# Patient Record
Sex: Female | Born: 1970
Health system: Southern US, Community
[De-identification: ages and names within clinical notes are randomized; demographics above are authoritative.]

## PROBLEM LIST (undated history)

## (undated) DIAGNOSIS — G9050 Complex regional pain syndrome I, unspecified: Secondary | ICD-10-CM

## (undated) DIAGNOSIS — M5136 Other intervertebral disc degeneration, lumbar region: Secondary | ICD-10-CM

## (undated) DIAGNOSIS — K219 Gastro-esophageal reflux disease without esophagitis: Secondary | ICD-10-CM

## (undated) DIAGNOSIS — M797 Fibromyalgia: Secondary | ICD-10-CM

## (undated) DIAGNOSIS — R4701 Aphasia: Secondary | ICD-10-CM

## (undated) DIAGNOSIS — K589 Irritable bowel syndrome without diarrhea: Secondary | ICD-10-CM

## (undated) DIAGNOSIS — M5137 Other intervertebral disc degeneration, lumbosacral region: Secondary | ICD-10-CM

## (undated) DIAGNOSIS — E785 Hyperlipidemia, unspecified: Secondary | ICD-10-CM

## (undated) DIAGNOSIS — J449 Chronic obstructive pulmonary disease, unspecified: Secondary | ICD-10-CM

## (undated) DIAGNOSIS — R1032 Left lower quadrant pain: Principal | ICD-10-CM

## (undated) DIAGNOSIS — M1990 Unspecified osteoarthritis, unspecified site: Secondary | ICD-10-CM

## (undated) DIAGNOSIS — M51379 Other intervertebral disc degeneration, lumbosacral region without mention of lumbar back pain or lower extremity pain: Secondary | ICD-10-CM

## (undated) DIAGNOSIS — M4300 Spondylolysis, site unspecified: Secondary | ICD-10-CM

## (undated) DIAGNOSIS — F419 Anxiety disorder, unspecified: Secondary | ICD-10-CM

## (undated) DIAGNOSIS — M5030 Other cervical disc degeneration, unspecified cervical region: Secondary | ICD-10-CM

## (undated) DIAGNOSIS — L409 Psoriasis, unspecified: Secondary | ICD-10-CM

## (undated) DIAGNOSIS — M419 Scoliosis, unspecified: Secondary | ICD-10-CM

## (undated) DIAGNOSIS — G43909 Migraine, unspecified, not intractable, without status migrainosus: Secondary | ICD-10-CM

## (undated) DIAGNOSIS — A048 Other specified bacterial intestinal infections: Secondary | ICD-10-CM

## (undated) DIAGNOSIS — R309 Painful micturition, unspecified: Principal | ICD-10-CM

## (undated) DIAGNOSIS — R4589 Other symptoms and signs involving emotional state: Secondary | ICD-10-CM

## (undated) DIAGNOSIS — I10 Essential (primary) hypertension: Secondary | ICD-10-CM

## (undated) DIAGNOSIS — E119 Type 2 diabetes mellitus without complications: Secondary | ICD-10-CM

## (undated) HISTORY — PX: CARPAL TUNNEL RELEASE: SHX101

## (undated) HISTORY — DX: Other cervical disc degeneration, unspecified cervical region: M50.30

## (undated) HISTORY — DX: Left lower quadrant pain: R10.32

## (undated) HISTORY — DX: Other symptoms and signs involving emotional state: R45.89

## (undated) HISTORY — PX: FOOT SURGERY: SHX648

## (undated) HISTORY — DX: Other specified bacterial intestinal infections: A04.8

## (undated) HISTORY — PX: ABDOMINAL HYSTERECTOMY: SHX81

## (undated) HISTORY — PX: DILATION AND CURETTAGE OF UTERUS: SHX78

## (undated) HISTORY — DX: Irritable bowel syndrome, unspecified: K58.9

## (undated) HISTORY — DX: Painful micturition, unspecified: R30.9

## (undated) HISTORY — PX: TONSILLECTOMY: SUR1361

## (undated) HISTORY — DX: Other intervertebral disc degeneration, lumbar region: M51.36

## (undated) HISTORY — DX: Scoliosis, unspecified: M41.9

## (undated) HISTORY — DX: Hyperlipidemia, unspecified: E78.5

## (undated) HISTORY — DX: Gastro-esophageal reflux disease without esophagitis: K21.9

## (undated) HISTORY — PX: OTHER SURGICAL HISTORY: SHX169

## (undated) HISTORY — PX: CHOLECYSTECTOMY: SHX55

---

## 2002-09-03 ENCOUNTER — Encounter: Payer: Self-pay | Admitting: *Deleted

## 2002-09-03 ENCOUNTER — Emergency Department (HOSPITAL_COMMUNITY): Admission: EM | Admit: 2002-09-03 | Discharge: 2002-09-04 | Payer: Self-pay | Admitting: *Deleted

## 2003-05-05 ENCOUNTER — Other Ambulatory Visit: Admission: RE | Admit: 2003-05-05 | Discharge: 2003-05-05 | Payer: Self-pay

## 2009-04-25 ENCOUNTER — Ambulatory Visit: Admission: RE | Admit: 2009-04-25 | Discharge: 2009-04-25 | Payer: Self-pay | Admitting: Neurology

## 2010-12-20 ENCOUNTER — Other Ambulatory Visit (HOSPITAL_COMMUNITY)
Admission: RE | Admit: 2010-12-20 | Discharge: 2010-12-20 | Disposition: A | Discharge status: Home / Self Care | Payer: Self-pay | Source: Ambulatory Visit | Attending: Internal Medicine | Admitting: Internal Medicine

## 2010-12-20 DIAGNOSIS — Z01419 Encounter for gynecological examination (general) (routine) without abnormal findings: Secondary | ICD-10-CM | POA: Insufficient documentation

## 2011-02-10 NOTE — Procedures (Signed)
NAMEADDYSEN, LOUTH            ACCOUNT NO.:  [id number]   MEDICAL RECORD NO.:  [id number]          PATIENT TYPE:  OUT   LOCATION:  SLEE                          FACILITY:  [facility]   PHYSICIAN:  Kofi A. Gerilyn Pilgrim, M.D. DATE OF BIRTH:  [birth year]   DATE OF PROCEDURE:  05/03/2009  DATE OF DISCHARGE:  04/25/2009                             SLEEP DISORDER REPORT   INDICATION:  This is a 40 year old lady who presented with obesity,  snoring, and insomnia.  She is being evaluated for obstructive sleep  apnea syndrome.   MEDICATIONS:  Lisinopril, sumatriptan, metoclopramide, alprazolam,  Imitrex, zolpidem, Topamax, Proventil, hydrocodone, triamcinolone,  Neurontin, trazodone, ranitidine, diclofenac, omeprazole.   Epworth sleepiness score 4.  BMI 48.   ARCHITECTURAL SUMMARY:  Total recording time is 465 minutes.  Sleep  efficiency 60%.  Sleep latency 133 minutes, REM latency 0.  Stage N1 1%,  N2 42%, N3 57%, and REM sleep 0.   RESPIRATORY SUMMARY:  Baseline oxygen saturation 97.  Lowest saturation  88.  AHI 0.6.   LIMB MOVEMENT SUMMARY:  PLM index 17.   ELECTROCARDIOGRAM SUMMARY:  Average heart rate is 81 with no significant  dysrhythmias observed.   IMPRESSION:  1. Moderate periodic limb movement disorder sleep.  2. Poor architecture with poor sleep efficiency and delayed sleep      onset.      Kofi A. Gerilyn Pilgrim, M.D.  Electronically Signed     KAD/MEDQ  D:  05/03/2009  T:  05/04/2009  Job:  [id number]

## 2011-02-10 NOTE — Procedures (Signed)
   NAME:  Judy Evans, Judy Evans                       ACCOUNT NO.:  [id number]   MEDICAL RECORD NO.:  [id number]                   PATIENT TYPE:  EMS   LOCATION:  ED                                   FACILITY:  [facility]   PHYSICIAN:  Edward L. Juanetta Gosling, M.D.             DATE OF BIRTH:  12-19-1970   DATE OF PROCEDURE:  09/03/2002  DATE OF DISCHARGE:                                EKG INTERPRETATION   Rhythm is sinus rhythm with a rate in the 70s with criteria for left  ventricular hypertrophy.   IMPRESSION:  Mildly abnormal electrocardiogram.                                               Edward L. Juanetta Gosling, M.D.    ELH/MEDQ  D:  10/06/2002  T:  10/07/2002  Job:  [id number]

## 2013-02-20 DIAGNOSIS — M2550 Pain in unspecified joint: Secondary | ICD-10-CM | POA: Insufficient documentation

## 2013-02-20 DIAGNOSIS — S0990XA Unspecified injury of head, initial encounter: Secondary | ICD-10-CM | POA: Insufficient documentation

## 2013-04-16 DIAGNOSIS — R109 Unspecified abdominal pain: Secondary | ICD-10-CM | POA: Insufficient documentation

## 2013-09-25 HISTORY — PX: CARDIAC CATHETERIZATION: SHX172

## 2014-01-07 DIAGNOSIS — R9439 Abnormal result of other cardiovascular function study: Secondary | ICD-10-CM | POA: Insufficient documentation

## 2014-01-12 ENCOUNTER — Emergency Department (HOSPITAL_COMMUNITY)
Admission: EM | Admit: 2014-01-12 | Discharge: 2014-01-12 | Discharge status: Against Medical Advice | Payer: Medicaid Other | Attending: Emergency Medicine | Admitting: Emergency Medicine

## 2014-01-12 ENCOUNTER — Encounter (HOSPITAL_COMMUNITY): Payer: Self-pay | Admitting: Emergency Medicine

## 2014-01-12 DIAGNOSIS — M79609 Pain in unspecified limb: Secondary | ICD-10-CM | POA: Insufficient documentation

## 2014-01-12 DIAGNOSIS — E119 Type 2 diabetes mellitus without complications: Secondary | ICD-10-CM | POA: Insufficient documentation

## 2014-01-12 DIAGNOSIS — J449 Chronic obstructive pulmonary disease, unspecified: Secondary | ICD-10-CM | POA: Insufficient documentation

## 2014-01-12 DIAGNOSIS — F17200 Nicotine dependence, unspecified, uncomplicated: Secondary | ICD-10-CM | POA: Insufficient documentation

## 2014-01-12 DIAGNOSIS — Z872 Personal history of diseases of the skin and subcutaneous tissue: Secondary | ICD-10-CM | POA: Insufficient documentation

## 2014-01-12 DIAGNOSIS — Z8739 Personal history of other diseases of the musculoskeletal system and connective tissue: Secondary | ICD-10-CM | POA: Insufficient documentation

## 2014-01-12 DIAGNOSIS — Z8679 Personal history of other diseases of the circulatory system: Secondary | ICD-10-CM | POA: Insufficient documentation

## 2014-01-12 DIAGNOSIS — I10 Essential (primary) hypertension: Secondary | ICD-10-CM | POA: Insufficient documentation

## 2014-01-12 DIAGNOSIS — J4489 Other specified chronic obstructive pulmonary disease: Secondary | ICD-10-CM | POA: Insufficient documentation

## 2014-01-12 HISTORY — DX: Chronic obstructive pulmonary disease, unspecified: J44.9

## 2014-01-12 HISTORY — DX: Aphasia: R47.01

## 2014-01-12 HISTORY — DX: Psoriasis, unspecified: L40.9

## 2014-01-12 HISTORY — DX: Migraine, unspecified, not intractable, without status migrainosus: G43.909

## 2014-01-12 HISTORY — DX: Unspecified osteoarthritis, unspecified site: M19.90

## 2014-01-12 HISTORY — DX: Type 2 diabetes mellitus without complications: E11.9

## 2014-01-12 HISTORY — DX: Fibromyalgia: M79.7

## 2014-01-12 HISTORY — DX: Essential (primary) hypertension: I10

## 2014-01-12 NOTE — ED Notes (Signed)
Pt reports cath 5 days ago, pt was seen in the ER in [place]. Pt states increase in pain & bruising is getting larger.

## 2014-01-12 NOTE — ED Notes (Addendum)
Pt had cardiac cath in [place] at [facility]  4/15.   Rt upper thigh  Swollen , and discolored.   Pt went to ER at [facility] and was rechecked after cath.

## 2014-01-12 NOTE — ED Notes (Signed)
Pt spoke w/ the patient advocate states she was leaving that she was not waiting any longer. Per pt advocate EDP went to the room & pt was getting ready to use restroom & EDP stated she would return. Pt left w/o signing.

## 2014-04-01 DIAGNOSIS — IMO0002 Reserved for concepts with insufficient information to code with codable children: Secondary | ICD-10-CM | POA: Diagnosis not present

## 2014-04-01 DIAGNOSIS — M549 Dorsalgia, unspecified: Secondary | ICD-10-CM | POA: Diagnosis not present

## 2014-04-01 DIAGNOSIS — M6530 Trigger finger, unspecified finger: Secondary | ICD-10-CM | POA: Diagnosis not present

## 2014-04-01 DIAGNOSIS — L4050 Arthropathic psoriasis, unspecified: Secondary | ICD-10-CM | POA: Diagnosis not present

## 2014-04-06 DIAGNOSIS — M47817 Spondylosis without myelopathy or radiculopathy, lumbosacral region: Secondary | ICD-10-CM | POA: Diagnosis not present

## 2014-04-06 DIAGNOSIS — M5380 Other specified dorsopathies, site unspecified: Secondary | ICD-10-CM | POA: Diagnosis not present

## 2014-04-06 DIAGNOSIS — M5126 Other intervertebral disc displacement, lumbar region: Secondary | ICD-10-CM | POA: Diagnosis not present

## 2014-04-08 DIAGNOSIS — M549 Dorsalgia, unspecified: Secondary | ICD-10-CM | POA: Diagnosis not present

## 2014-04-08 DIAGNOSIS — M6530 Trigger finger, unspecified finger: Secondary | ICD-10-CM | POA: Diagnosis not present

## 2014-04-08 DIAGNOSIS — IMO0002 Reserved for concepts with insufficient information to code with codable children: Secondary | ICD-10-CM | POA: Diagnosis not present

## 2014-04-08 DIAGNOSIS — L4050 Arthropathic psoriasis, unspecified: Secondary | ICD-10-CM | POA: Diagnosis not present

## 2014-04-10 DIAGNOSIS — M479 Spondylosis, unspecified: Secondary | ICD-10-CM | POA: Diagnosis not present

## 2014-05-12 DIAGNOSIS — Z79899 Other long term (current) drug therapy: Secondary | ICD-10-CM | POA: Diagnosis not present

## 2014-05-12 DIAGNOSIS — F3111 Bipolar disorder, current episode manic without psychotic features, mild: Secondary | ICD-10-CM | POA: Diagnosis not present

## 2014-05-12 DIAGNOSIS — E669 Obesity, unspecified: Secondary | ICD-10-CM | POA: Diagnosis not present

## 2014-05-12 DIAGNOSIS — G43909 Migraine, unspecified, not intractable, without status migrainosus: Secondary | ICD-10-CM | POA: Diagnosis not present

## 2014-05-12 DIAGNOSIS — IMO0001 Reserved for inherently not codable concepts without codable children: Secondary | ICD-10-CM | POA: Diagnosis not present

## 2014-05-12 DIAGNOSIS — F411 Generalized anxiety disorder: Secondary | ICD-10-CM | POA: Diagnosis not present

## 2014-05-12 DIAGNOSIS — J45909 Unspecified asthma, uncomplicated: Secondary | ICD-10-CM | POA: Diagnosis not present

## 2014-05-14 DIAGNOSIS — M47817 Spondylosis without myelopathy or radiculopathy, lumbosacral region: Secondary | ICD-10-CM | POA: Diagnosis not present

## 2014-05-14 DIAGNOSIS — IMO0001 Reserved for inherently not codable concepts without codable children: Secondary | ICD-10-CM | POA: Diagnosis not present

## 2014-05-14 DIAGNOSIS — M2559 Pain in other specified joint: Secondary | ICD-10-CM | POA: Diagnosis not present

## 2014-05-14 DIAGNOSIS — M5137 Other intervertebral disc degeneration, lumbosacral region: Secondary | ICD-10-CM | POA: Diagnosis not present

## 2014-05-26 DIAGNOSIS — I770 Arteriovenous fistula, acquired: Secondary | ICD-10-CM | POA: Diagnosis not present

## 2014-05-28 DIAGNOSIS — F17200 Nicotine dependence, unspecified, uncomplicated: Secondary | ICD-10-CM | POA: Diagnosis not present

## 2014-05-28 DIAGNOSIS — M5380 Other specified dorsopathies, site unspecified: Secondary | ICD-10-CM | POA: Diagnosis not present

## 2014-05-28 DIAGNOSIS — Z880 Allergy status to penicillin: Secondary | ICD-10-CM | POA: Diagnosis not present

## 2014-05-28 DIAGNOSIS — IMO0001 Reserved for inherently not codable concepts without codable children: Secondary | ICD-10-CM | POA: Diagnosis not present

## 2014-05-28 DIAGNOSIS — Z888 Allergy status to other drugs, medicaments and biological substances status: Secondary | ICD-10-CM | POA: Diagnosis not present

## 2014-05-28 DIAGNOSIS — G8929 Other chronic pain: Secondary | ICD-10-CM | POA: Diagnosis not present

## 2014-05-28 DIAGNOSIS — L4050 Arthropathic psoriasis, unspecified: Secondary | ICD-10-CM | POA: Diagnosis not present

## 2014-05-28 DIAGNOSIS — F411 Generalized anxiety disorder: Secondary | ICD-10-CM | POA: Diagnosis not present

## 2014-05-28 DIAGNOSIS — M2559 Pain in other specified joint: Secondary | ICD-10-CM | POA: Diagnosis not present

## 2014-05-28 DIAGNOSIS — M5137 Other intervertebral disc degeneration, lumbosacral region: Secondary | ICD-10-CM | POA: Diagnosis not present

## 2014-05-28 DIAGNOSIS — F319 Bipolar disorder, unspecified: Secondary | ICD-10-CM | POA: Diagnosis not present

## 2014-05-28 DIAGNOSIS — Z79899 Other long term (current) drug therapy: Secondary | ICD-10-CM | POA: Diagnosis not present

## 2014-05-28 DIAGNOSIS — M47817 Spondylosis without myelopathy or radiculopathy, lumbosacral region: Secondary | ICD-10-CM | POA: Diagnosis not present

## 2014-05-28 DIAGNOSIS — K589 Irritable bowel syndrome without diarrhea: Secondary | ICD-10-CM | POA: Diagnosis not present

## 2014-06-02 DIAGNOSIS — D235 Other benign neoplasm of skin of trunk: Secondary | ICD-10-CM | POA: Diagnosis not present

## 2014-06-02 DIAGNOSIS — L0100 Impetigo, unspecified: Secondary | ICD-10-CM | POA: Diagnosis not present

## 2014-06-02 DIAGNOSIS — D485 Neoplasm of uncertain behavior of skin: Secondary | ICD-10-CM | POA: Diagnosis not present

## 2014-06-02 DIAGNOSIS — L408 Other psoriasis: Secondary | ICD-10-CM | POA: Diagnosis not present

## 2014-06-10 DIAGNOSIS — J069 Acute upper respiratory infection, unspecified: Secondary | ICD-10-CM | POA: Diagnosis not present

## 2014-06-10 DIAGNOSIS — J329 Chronic sinusitis, unspecified: Secondary | ICD-10-CM | POA: Diagnosis not present

## 2014-06-15 DIAGNOSIS — I770 Arteriovenous fistula, acquired: Secondary | ICD-10-CM | POA: Diagnosis not present

## 2014-06-16 DIAGNOSIS — J209 Acute bronchitis, unspecified: Secondary | ICD-10-CM | POA: Diagnosis not present

## 2014-06-16 DIAGNOSIS — L570 Actinic keratosis: Secondary | ICD-10-CM | POA: Diagnosis not present

## 2014-06-16 DIAGNOSIS — Z79899 Other long term (current) drug therapy: Secondary | ICD-10-CM | POA: Diagnosis not present

## 2014-06-16 DIAGNOSIS — L408 Other psoriasis: Secondary | ICD-10-CM | POA: Diagnosis not present

## 2014-06-16 DIAGNOSIS — D485 Neoplasm of uncertain behavior of skin: Secondary | ICD-10-CM | POA: Diagnosis not present

## 2014-06-16 DIAGNOSIS — L98499 Non-pressure chronic ulcer of skin of other sites with unspecified severity: Secondary | ICD-10-CM | POA: Diagnosis not present

## 2014-06-23 DIAGNOSIS — R220 Localized swelling, mass and lump, head: Secondary | ICD-10-CM | POA: Diagnosis not present

## 2014-06-23 DIAGNOSIS — R059 Cough, unspecified: Secondary | ICD-10-CM | POA: Diagnosis not present

## 2014-06-23 DIAGNOSIS — Z79899 Other long term (current) drug therapy: Secondary | ICD-10-CM | POA: Diagnosis not present

## 2014-06-23 DIAGNOSIS — R6889 Other general symptoms and signs: Secondary | ICD-10-CM | POA: Diagnosis not present

## 2014-06-23 DIAGNOSIS — IMO0001 Reserved for inherently not codable concepts without codable children: Secondary | ICD-10-CM | POA: Diagnosis not present

## 2014-06-23 DIAGNOSIS — F411 Generalized anxiety disorder: Secondary | ICD-10-CM | POA: Diagnosis not present

## 2014-06-23 DIAGNOSIS — Z85828 Personal history of other malignant neoplasm of skin: Secondary | ICD-10-CM | POA: Diagnosis not present

## 2014-06-23 DIAGNOSIS — E7800 Pure hypercholesterolemia, unspecified: Secondary | ICD-10-CM | POA: Diagnosis not present

## 2014-06-23 DIAGNOSIS — G4700 Insomnia, unspecified: Secondary | ICD-10-CM | POA: Diagnosis not present

## 2014-06-23 DIAGNOSIS — H9209 Otalgia, unspecified ear: Secondary | ICD-10-CM | POA: Diagnosis not present

## 2014-06-23 DIAGNOSIS — Z888 Allergy status to other drugs, medicaments and biological substances status: Secondary | ICD-10-CM | POA: Diagnosis not present

## 2014-06-23 DIAGNOSIS — F17200 Nicotine dependence, unspecified, uncomplicated: Secondary | ICD-10-CM | POA: Diagnosis not present

## 2014-06-23 DIAGNOSIS — R209 Unspecified disturbances of skin sensation: Secondary | ICD-10-CM | POA: Diagnosis not present

## 2014-06-23 DIAGNOSIS — L408 Other psoriasis: Secondary | ICD-10-CM | POA: Diagnosis not present

## 2014-06-23 DIAGNOSIS — Z9889 Other specified postprocedural states: Secondary | ICD-10-CM | POA: Diagnosis not present

## 2014-06-23 DIAGNOSIS — J449 Chronic obstructive pulmonary disease, unspecified: Secondary | ICD-10-CM | POA: Diagnosis not present

## 2014-06-23 DIAGNOSIS — Z48817 Encounter for surgical aftercare following surgery on the skin and subcutaneous tissue: Secondary | ICD-10-CM | POA: Diagnosis not present

## 2014-06-23 DIAGNOSIS — Z9089 Acquired absence of other organs: Secondary | ICD-10-CM | POA: Diagnosis not present

## 2014-06-23 DIAGNOSIS — Z90710 Acquired absence of both cervix and uterus: Secondary | ICD-10-CM | POA: Diagnosis not present

## 2014-06-23 DIAGNOSIS — S81009A Unspecified open wound, unspecified knee, initial encounter: Secondary | ICD-10-CM | POA: Diagnosis not present

## 2014-06-23 DIAGNOSIS — J329 Chronic sinusitis, unspecified: Secondary | ICD-10-CM | POA: Diagnosis not present

## 2014-06-23 DIAGNOSIS — S81809A Unspecified open wound, unspecified lower leg, initial encounter: Secondary | ICD-10-CM | POA: Diagnosis not present

## 2014-06-23 DIAGNOSIS — I10 Essential (primary) hypertension: Secondary | ICD-10-CM | POA: Diagnosis not present

## 2014-06-23 DIAGNOSIS — F329 Major depressive disorder, single episode, unspecified: Secondary | ICD-10-CM | POA: Diagnosis not present

## 2014-06-23 DIAGNOSIS — Z880 Allergy status to penicillin: Secondary | ICD-10-CM | POA: Diagnosis not present

## 2014-06-23 DIAGNOSIS — F3289 Other specified depressive episodes: Secondary | ICD-10-CM | POA: Diagnosis not present

## 2014-06-25 DIAGNOSIS — R0981 Nasal congestion: Secondary | ICD-10-CM | POA: Diagnosis not present

## 2014-06-25 DIAGNOSIS — J329 Chronic sinusitis, unspecified: Secondary | ICD-10-CM | POA: Diagnosis not present

## 2014-06-25 DIAGNOSIS — J351 Hypertrophy of tonsils: Secondary | ICD-10-CM | POA: Diagnosis not present

## 2014-06-25 DIAGNOSIS — J343 Hypertrophy of nasal turbinates: Secondary | ICD-10-CM | POA: Diagnosis not present

## 2014-06-25 DIAGNOSIS — I891 Lymphangitis: Secondary | ICD-10-CM | POA: Diagnosis not present

## 2014-06-25 HISTORY — PX: COLONOSCOPY: SHX174

## 2014-07-08 DIAGNOSIS — K625 Hemorrhage of anus and rectum: Secondary | ICD-10-CM | POA: Diagnosis not present

## 2014-07-08 DIAGNOSIS — Z86010 Personal history of colonic polyps: Secondary | ICD-10-CM | POA: Diagnosis not present

## 2014-07-14 DIAGNOSIS — Z825 Family history of asthma and other chronic lower respiratory diseases: Secondary | ICD-10-CM | POA: Diagnosis not present

## 2014-07-14 DIAGNOSIS — Z833 Family history of diabetes mellitus: Secondary | ICD-10-CM | POA: Diagnosis not present

## 2014-07-14 DIAGNOSIS — E119 Type 2 diabetes mellitus without complications: Secondary | ICD-10-CM | POA: Diagnosis not present

## 2014-07-14 DIAGNOSIS — Z8249 Family history of ischemic heart disease and other diseases of the circulatory system: Secondary | ICD-10-CM | POA: Diagnosis not present

## 2014-07-14 DIAGNOSIS — Z86010 Personal history of colonic polyps: Secondary | ICD-10-CM | POA: Diagnosis not present

## 2014-07-14 DIAGNOSIS — F17200 Nicotine dependence, unspecified, uncomplicated: Secondary | ICD-10-CM | POA: Diagnosis not present

## 2014-07-14 DIAGNOSIS — Z888 Allergy status to other drugs, medicaments and biological substances status: Secondary | ICD-10-CM | POA: Diagnosis not present

## 2014-07-14 DIAGNOSIS — K5900 Constipation, unspecified: Secondary | ICD-10-CM | POA: Diagnosis not present

## 2014-07-14 DIAGNOSIS — Z79899 Other long term (current) drug therapy: Secondary | ICD-10-CM | POA: Diagnosis not present

## 2014-07-14 DIAGNOSIS — R1030 Lower abdominal pain, unspecified: Secondary | ICD-10-CM | POA: Diagnosis not present

## 2014-07-14 DIAGNOSIS — F319 Bipolar disorder, unspecified: Secondary | ICD-10-CM | POA: Diagnosis not present

## 2014-07-14 DIAGNOSIS — Z8489 Family history of other specified conditions: Secondary | ICD-10-CM | POA: Diagnosis not present

## 2014-07-14 DIAGNOSIS — Z800 Family history of malignant neoplasm of digestive organs: Secondary | ICD-10-CM | POA: Diagnosis not present

## 2014-07-14 DIAGNOSIS — Z880 Allergy status to penicillin: Secondary | ICD-10-CM | POA: Diagnosis not present

## 2014-07-14 DIAGNOSIS — I10 Essential (primary) hypertension: Secondary | ICD-10-CM | POA: Diagnosis not present

## 2014-07-14 DIAGNOSIS — K625 Hemorrhage of anus and rectum: Secondary | ICD-10-CM | POA: Diagnosis not present

## 2014-07-14 DIAGNOSIS — F418 Other specified anxiety disorders: Secondary | ICD-10-CM | POA: Diagnosis not present

## 2014-07-26 HISTORY — PX: ESOPHAGOGASTRODUODENOSCOPY: SHX1529

## 2014-08-03 DIAGNOSIS — K589 Irritable bowel syndrome without diarrhea: Secondary | ICD-10-CM | POA: Diagnosis not present

## 2014-08-03 DIAGNOSIS — L4050 Arthropathic psoriasis, unspecified: Secondary | ICD-10-CM | POA: Diagnosis not present

## 2014-08-03 DIAGNOSIS — M5136 Other intervertebral disc degeneration, lumbar region: Secondary | ICD-10-CM | POA: Diagnosis not present

## 2014-08-03 DIAGNOSIS — M797 Fibromyalgia: Secondary | ICD-10-CM | POA: Diagnosis not present

## 2014-08-03 DIAGNOSIS — F17210 Nicotine dependence, cigarettes, uncomplicated: Secondary | ICD-10-CM | POA: Diagnosis not present

## 2014-08-03 DIAGNOSIS — M545 Low back pain: Secondary | ICD-10-CM | POA: Diagnosis not present

## 2014-08-03 DIAGNOSIS — Z880 Allergy status to penicillin: Secondary | ICD-10-CM | POA: Diagnosis not present

## 2014-08-03 DIAGNOSIS — M47896 Other spondylosis, lumbar region: Secondary | ICD-10-CM | POA: Diagnosis not present

## 2014-08-03 DIAGNOSIS — Z79899 Other long term (current) drug therapy: Secondary | ICD-10-CM | POA: Diagnosis not present

## 2014-08-03 DIAGNOSIS — F319 Bipolar disorder, unspecified: Secondary | ICD-10-CM | POA: Diagnosis not present

## 2014-08-03 DIAGNOSIS — G8929 Other chronic pain: Secondary | ICD-10-CM | POA: Diagnosis not present

## 2014-08-03 DIAGNOSIS — Z888 Allergy status to other drugs, medicaments and biological substances status: Secondary | ICD-10-CM | POA: Diagnosis not present

## 2014-08-03 DIAGNOSIS — F418 Other specified anxiety disorders: Secondary | ICD-10-CM | POA: Diagnosis not present

## 2014-08-03 DIAGNOSIS — M5408 Panniculitis affecting regions of neck and back, sacral and sacrococcygeal region: Secondary | ICD-10-CM | POA: Diagnosis not present

## 2014-08-06 DIAGNOSIS — K219 Gastro-esophageal reflux disease without esophagitis: Secondary | ICD-10-CM | POA: Diagnosis not present

## 2014-08-10 DIAGNOSIS — L400 Psoriasis vulgaris: Secondary | ICD-10-CM | POA: Diagnosis not present

## 2014-08-10 DIAGNOSIS — Z79891 Long term (current) use of opiate analgesic: Secondary | ICD-10-CM | POA: Diagnosis not present

## 2014-08-11 DIAGNOSIS — Z8249 Family history of ischemic heart disease and other diseases of the circulatory system: Secondary | ICD-10-CM | POA: Diagnosis not present

## 2014-08-11 DIAGNOSIS — Z833 Family history of diabetes mellitus: Secondary | ICD-10-CM | POA: Diagnosis not present

## 2014-08-11 DIAGNOSIS — I10 Essential (primary) hypertension: Secondary | ICD-10-CM | POA: Diagnosis not present

## 2014-08-11 DIAGNOSIS — Z880 Allergy status to penicillin: Secondary | ICD-10-CM | POA: Diagnosis not present

## 2014-08-11 DIAGNOSIS — Z8489 Family history of other specified conditions: Secondary | ICD-10-CM | POA: Diagnosis not present

## 2014-08-11 DIAGNOSIS — Z888 Allergy status to other drugs, medicaments and biological substances status: Secondary | ICD-10-CM | POA: Diagnosis not present

## 2014-08-11 DIAGNOSIS — E119 Type 2 diabetes mellitus without complications: Secondary | ICD-10-CM | POA: Diagnosis not present

## 2014-08-11 DIAGNOSIS — Z825 Family history of asthma and other chronic lower respiratory diseases: Secondary | ICD-10-CM | POA: Diagnosis not present

## 2014-08-11 DIAGNOSIS — F17200 Nicotine dependence, unspecified, uncomplicated: Secondary | ICD-10-CM | POA: Diagnosis not present

## 2014-08-11 DIAGNOSIS — Z79899 Other long term (current) drug therapy: Secondary | ICD-10-CM | POA: Diagnosis not present

## 2014-08-11 DIAGNOSIS — F319 Bipolar disorder, unspecified: Secondary | ICD-10-CM | POA: Diagnosis not present

## 2014-08-11 DIAGNOSIS — K219 Gastro-esophageal reflux disease without esophagitis: Secondary | ICD-10-CM | POA: Diagnosis not present

## 2014-08-11 DIAGNOSIS — F418 Other specified anxiety disorders: Secondary | ICD-10-CM | POA: Diagnosis not present

## 2014-08-24 DIAGNOSIS — F339 Major depressive disorder, recurrent, unspecified: Secondary | ICD-10-CM | POA: Diagnosis not present

## 2014-08-31 DIAGNOSIS — F329 Major depressive disorder, single episode, unspecified: Secondary | ICD-10-CM | POA: Diagnosis not present

## 2014-08-31 DIAGNOSIS — R51 Headache: Secondary | ICD-10-CM | POA: Diagnosis not present

## 2014-08-31 DIAGNOSIS — L400 Psoriasis vulgaris: Secondary | ICD-10-CM | POA: Diagnosis not present

## 2014-08-31 DIAGNOSIS — Z79899 Other long term (current) drug therapy: Secondary | ICD-10-CM | POA: Diagnosis not present

## 2014-09-11 DIAGNOSIS — E784 Other hyperlipidemia: Secondary | ICD-10-CM | POA: Diagnosis not present

## 2014-09-11 DIAGNOSIS — G43709 Chronic migraine without aura, not intractable, without status migrainosus: Secondary | ICD-10-CM | POA: Diagnosis not present

## 2014-09-11 DIAGNOSIS — E1165 Type 2 diabetes mellitus with hyperglycemia: Secondary | ICD-10-CM | POA: Diagnosis not present

## 2014-09-21 DIAGNOSIS — L400 Psoriasis vulgaris: Secondary | ICD-10-CM | POA: Diagnosis not present

## 2014-09-21 DIAGNOSIS — Z79899 Other long term (current) drug therapy: Secondary | ICD-10-CM | POA: Diagnosis not present

## 2014-09-24 DIAGNOSIS — Z79899 Other long term (current) drug therapy: Secondary | ICD-10-CM | POA: Diagnosis not present

## 2014-09-29 DIAGNOSIS — N951 Menopausal and female climacteric states: Secondary | ICD-10-CM | POA: Diagnosis not present

## 2014-10-01 DIAGNOSIS — M5431 Sciatica, right side: Secondary | ICD-10-CM | POA: Diagnosis not present

## 2014-10-01 DIAGNOSIS — G8929 Other chronic pain: Secondary | ICD-10-CM | POA: Diagnosis not present

## 2014-10-01 DIAGNOSIS — M65311 Trigger thumb, right thumb: Secondary | ICD-10-CM | POA: Diagnosis not present

## 2014-10-04 DIAGNOSIS — J019 Acute sinusitis, unspecified: Secondary | ICD-10-CM | POA: Diagnosis not present

## 2014-10-28 DIAGNOSIS — J449 Chronic obstructive pulmonary disease, unspecified: Secondary | ICD-10-CM | POA: Diagnosis not present

## 2014-10-28 DIAGNOSIS — F418 Other specified anxiety disorders: Secondary | ICD-10-CM | POA: Diagnosis not present

## 2014-10-28 DIAGNOSIS — I10 Essential (primary) hypertension: Secondary | ICD-10-CM | POA: Diagnosis not present

## 2014-10-28 DIAGNOSIS — M797 Fibromyalgia: Secondary | ICD-10-CM | POA: Diagnosis not present

## 2014-10-28 DIAGNOSIS — Z9049 Acquired absence of other specified parts of digestive tract: Secondary | ICD-10-CM | POA: Diagnosis not present

## 2014-10-28 DIAGNOSIS — M479 Spondylosis, unspecified: Secondary | ICD-10-CM | POA: Diagnosis not present

## 2014-10-28 DIAGNOSIS — G43909 Migraine, unspecified, not intractable, without status migrainosus: Secondary | ICD-10-CM | POA: Diagnosis not present

## 2014-10-28 DIAGNOSIS — F319 Bipolar disorder, unspecified: Secondary | ICD-10-CM | POA: Diagnosis not present

## 2014-10-28 DIAGNOSIS — M5136 Other intervertebral disc degeneration, lumbar region: Secondary | ICD-10-CM | POA: Diagnosis not present

## 2014-10-28 DIAGNOSIS — E780 Pure hypercholesterolemia: Secondary | ICD-10-CM | POA: Diagnosis not present

## 2014-10-28 DIAGNOSIS — M419 Scoliosis, unspecified: Secondary | ICD-10-CM | POA: Diagnosis not present

## 2014-10-28 DIAGNOSIS — Z90710 Acquired absence of both cervix and uterus: Secondary | ICD-10-CM | POA: Diagnosis not present

## 2014-10-28 DIAGNOSIS — Z79899 Other long term (current) drug therapy: Secondary | ICD-10-CM | POA: Diagnosis not present

## 2014-11-02 DIAGNOSIS — M797 Fibromyalgia: Secondary | ICD-10-CM | POA: Diagnosis not present

## 2014-11-02 DIAGNOSIS — M5136 Other intervertebral disc degeneration, lumbar region: Secondary | ICD-10-CM | POA: Diagnosis not present

## 2014-11-02 DIAGNOSIS — Z79899 Other long term (current) drug therapy: Secondary | ICD-10-CM | POA: Diagnosis not present

## 2014-11-02 DIAGNOSIS — F419 Anxiety disorder, unspecified: Secondary | ICD-10-CM | POA: Diagnosis not present

## 2014-11-02 DIAGNOSIS — G8929 Other chronic pain: Secondary | ICD-10-CM | POA: Diagnosis not present

## 2014-11-02 DIAGNOSIS — Z888 Allergy status to other drugs, medicaments and biological substances status: Secondary | ICD-10-CM | POA: Diagnosis not present

## 2014-11-02 DIAGNOSIS — L4050 Arthropathic psoriasis, unspecified: Secondary | ICD-10-CM | POA: Diagnosis not present

## 2014-11-02 DIAGNOSIS — F319 Bipolar disorder, unspecified: Secondary | ICD-10-CM | POA: Diagnosis not present

## 2014-11-02 DIAGNOSIS — K589 Irritable bowel syndrome without diarrhea: Secondary | ICD-10-CM | POA: Diagnosis not present

## 2014-11-02 DIAGNOSIS — F17210 Nicotine dependence, cigarettes, uncomplicated: Secondary | ICD-10-CM | POA: Diagnosis not present

## 2014-11-02 DIAGNOSIS — Z880 Allergy status to penicillin: Secondary | ICD-10-CM | POA: Diagnosis not present

## 2014-11-02 DIAGNOSIS — M47816 Spondylosis without myelopathy or radiculopathy, lumbar region: Secondary | ICD-10-CM | POA: Diagnosis not present

## 2014-11-16 DIAGNOSIS — F339 Major depressive disorder, recurrent, unspecified: Secondary | ICD-10-CM | POA: Diagnosis not present

## 2014-11-25 ENCOUNTER — Encounter: Payer: Self-pay | Admitting: *Deleted

## 2014-12-01 ENCOUNTER — Encounter: Payer: Medicare Other | Admitting: Women's Health

## 2014-12-02 DIAGNOSIS — M545 Low back pain: Secondary | ICD-10-CM | POA: Diagnosis not present

## 2014-12-02 DIAGNOSIS — M5136 Other intervertebral disc degeneration, lumbar region: Secondary | ICD-10-CM | POA: Diagnosis not present

## 2014-12-02 DIAGNOSIS — M797 Fibromyalgia: Secondary | ICD-10-CM | POA: Diagnosis not present

## 2014-12-02 DIAGNOSIS — M47816 Spondylosis without myelopathy or radiculopathy, lumbar region: Secondary | ICD-10-CM | POA: Diagnosis not present

## 2014-12-07 DIAGNOSIS — J40 Bronchitis, not specified as acute or chronic: Secondary | ICD-10-CM | POA: Diagnosis not present

## 2014-12-07 DIAGNOSIS — R300 Dysuria: Secondary | ICD-10-CM | POA: Diagnosis not present

## 2014-12-08 ENCOUNTER — Encounter: Payer: Medicare Other | Admitting: Women's Health

## 2014-12-09 DIAGNOSIS — M79604 Pain in right leg: Secondary | ICD-10-CM | POA: Diagnosis not present

## 2014-12-09 DIAGNOSIS — M5136 Other intervertebral disc degeneration, lumbar region: Secondary | ICD-10-CM | POA: Diagnosis not present

## 2014-12-09 DIAGNOSIS — F17200 Nicotine dependence, unspecified, uncomplicated: Secondary | ICD-10-CM | POA: Diagnosis not present

## 2014-12-09 DIAGNOSIS — I739 Peripheral vascular disease, unspecified: Secondary | ICD-10-CM | POA: Diagnosis not present

## 2014-12-14 DIAGNOSIS — B349 Viral infection, unspecified: Secondary | ICD-10-CM | POA: Diagnosis not present

## 2014-12-23 DIAGNOSIS — F17200 Nicotine dependence, unspecified, uncomplicated: Secondary | ICD-10-CM | POA: Diagnosis not present

## 2014-12-23 DIAGNOSIS — I10 Essential (primary) hypertension: Secondary | ICD-10-CM | POA: Diagnosis not present

## 2014-12-23 DIAGNOSIS — Z79899 Other long term (current) drug therapy: Secondary | ICD-10-CM | POA: Diagnosis not present

## 2014-12-23 DIAGNOSIS — E780 Pure hypercholesterolemia: Secondary | ICD-10-CM | POA: Diagnosis not present

## 2014-12-23 DIAGNOSIS — J45909 Unspecified asthma, uncomplicated: Secondary | ICD-10-CM | POA: Diagnosis not present

## 2014-12-23 DIAGNOSIS — E119 Type 2 diabetes mellitus without complications: Secondary | ICD-10-CM | POA: Diagnosis not present

## 2014-12-23 DIAGNOSIS — F319 Bipolar disorder, unspecified: Secondary | ICD-10-CM | POA: Diagnosis not present

## 2014-12-23 DIAGNOSIS — M797 Fibromyalgia: Secondary | ICD-10-CM | POA: Diagnosis not present

## 2014-12-23 DIAGNOSIS — J302 Other seasonal allergic rhinitis: Secondary | ICD-10-CM | POA: Diagnosis not present

## 2014-12-23 DIAGNOSIS — R51 Headache: Secondary | ICD-10-CM | POA: Diagnosis not present

## 2014-12-23 DIAGNOSIS — J449 Chronic obstructive pulmonary disease, unspecified: Secondary | ICD-10-CM | POA: Diagnosis not present

## 2014-12-24 DIAGNOSIS — Z90710 Acquired absence of both cervix and uterus: Secondary | ICD-10-CM | POA: Diagnosis not present

## 2014-12-24 DIAGNOSIS — R109 Unspecified abdominal pain: Secondary | ICD-10-CM | POA: Diagnosis not present

## 2014-12-24 DIAGNOSIS — R300 Dysuria: Secondary | ICD-10-CM | POA: Diagnosis not present

## 2014-12-24 DIAGNOSIS — K449 Diaphragmatic hernia without obstruction or gangrene: Secondary | ICD-10-CM | POA: Diagnosis not present

## 2014-12-24 DIAGNOSIS — J449 Chronic obstructive pulmonary disease, unspecified: Secondary | ICD-10-CM | POA: Diagnosis not present

## 2014-12-24 DIAGNOSIS — Z9049 Acquired absence of other specified parts of digestive tract: Secondary | ICD-10-CM | POA: Diagnosis not present

## 2014-12-24 DIAGNOSIS — R1032 Left lower quadrant pain: Secondary | ICD-10-CM | POA: Diagnosis not present

## 2014-12-24 DIAGNOSIS — F319 Bipolar disorder, unspecified: Secondary | ICD-10-CM | POA: Diagnosis not present

## 2014-12-24 DIAGNOSIS — M5136 Other intervertebral disc degeneration, lumbar region: Secondary | ICD-10-CM | POA: Diagnosis not present

## 2014-12-24 DIAGNOSIS — M419 Scoliosis, unspecified: Secondary | ICD-10-CM | POA: Diagnosis not present

## 2014-12-24 DIAGNOSIS — I10 Essential (primary) hypertension: Secondary | ICD-10-CM | POA: Diagnosis not present

## 2014-12-24 DIAGNOSIS — E780 Pure hypercholesterolemia: Secondary | ICD-10-CM | POA: Diagnosis not present

## 2014-12-24 DIAGNOSIS — Z79899 Other long term (current) drug therapy: Secondary | ICD-10-CM | POA: Diagnosis not present

## 2014-12-24 DIAGNOSIS — M479 Spondylosis, unspecified: Secondary | ICD-10-CM | POA: Diagnosis not present

## 2014-12-24 DIAGNOSIS — N390 Urinary tract infection, site not specified: Secondary | ICD-10-CM | POA: Diagnosis not present

## 2014-12-24 DIAGNOSIS — Z720 Tobacco use: Secondary | ICD-10-CM | POA: Diagnosis not present

## 2014-12-24 DIAGNOSIS — M1990 Unspecified osteoarthritis, unspecified site: Secondary | ICD-10-CM | POA: Diagnosis not present

## 2014-12-28 ENCOUNTER — Encounter: Payer: Medicare Other | Admitting: Women's Health

## 2014-12-28 DIAGNOSIS — J0110 Acute frontal sinusitis, unspecified: Secondary | ICD-10-CM | POA: Diagnosis not present

## 2014-12-28 DIAGNOSIS — L401 Generalized pustular psoriasis: Secondary | ICD-10-CM | POA: Diagnosis not present

## 2014-12-28 DIAGNOSIS — E1165 Type 2 diabetes mellitus with hyperglycemia: Secondary | ICD-10-CM | POA: Diagnosis not present

## 2014-12-28 DIAGNOSIS — E784 Other hyperlipidemia: Secondary | ICD-10-CM | POA: Diagnosis not present

## 2015-01-06 ENCOUNTER — Encounter: Payer: Medicare Other | Admitting: Women's Health

## 2015-01-12 DIAGNOSIS — Z1231 Encounter for screening mammogram for malignant neoplasm of breast: Secondary | ICD-10-CM | POA: Diagnosis not present

## 2015-01-13 ENCOUNTER — Encounter: Payer: Self-pay | Admitting: Women's Health

## 2015-01-13 ENCOUNTER — Ambulatory Visit (INDEPENDENT_AMBULATORY_CARE_PROVIDER_SITE_OTHER): Payer: Medicare Other | Admitting: Women's Health

## 2015-01-13 VITALS — BP 120/62 | HR 80 | Ht 61.25 in | Wt 249.0 lb

## 2015-01-13 DIAGNOSIS — G43709 Chronic migraine without aura, not intractable, without status migrainosus: Secondary | ICD-10-CM | POA: Insufficient documentation

## 2015-01-13 DIAGNOSIS — I10 Essential (primary) hypertension: Secondary | ICD-10-CM | POA: Insufficient documentation

## 2015-01-13 DIAGNOSIS — E8941 Symptomatic postprocedural ovarian failure: Secondary | ICD-10-CM | POA: Insufficient documentation

## 2015-01-13 DIAGNOSIS — R102 Pelvic and perineal pain: Secondary | ICD-10-CM | POA: Diagnosis not present

## 2015-01-13 DIAGNOSIS — J449 Chronic obstructive pulmonary disease, unspecified: Secondary | ICD-10-CM | POA: Insufficient documentation

## 2015-01-13 DIAGNOSIS — R399 Unspecified symptoms and signs involving the genitourinary system: Secondary | ICD-10-CM | POA: Diagnosis not present

## 2015-01-13 DIAGNOSIS — R300 Dysuria: Secondary | ICD-10-CM | POA: Insufficient documentation

## 2015-01-13 DIAGNOSIS — L409 Psoriasis, unspecified: Secondary | ICD-10-CM | POA: Insufficient documentation

## 2015-01-13 DIAGNOSIS — M797 Fibromyalgia: Secondary | ICD-10-CM | POA: Insufficient documentation

## 2015-01-13 DIAGNOSIS — E785 Hyperlipidemia, unspecified: Secondary | ICD-10-CM | POA: Insufficient documentation

## 2015-01-13 DIAGNOSIS — M1990 Unspecified osteoarthritis, unspecified site: Secondary | ICD-10-CM | POA: Insufficient documentation

## 2015-01-13 DIAGNOSIS — R7303 Prediabetes: Secondary | ICD-10-CM | POA: Insufficient documentation

## 2015-01-13 LAB — POCT URINALYSIS DIPSTICK
Blood, UA: NEGATIVE
Glucose, UA: NEGATIVE
Ketones, UA: NEGATIVE
LEUKOCYTES UA: NEGATIVE
NITRITE UA: NEGATIVE
Protein, UA: NEGATIVE

## 2015-01-13 LAB — POCT WET PREP (WET MOUNT): Clue Cells Wet Prep Whiff POC: NEGATIVE

## 2015-01-13 MED ORDER — NYSTATIN 100000 UNIT/GM EX OINT: 1.0000 "application " | TOPICAL_OINTMENT | Freq: Two times a day (BID) | CUTANEOUS | Status: DC | PRN

## 2015-01-13 MED ORDER — TRIAMCINOLONE ACETONIDE 0.1 % EX OINT: 1.0000 "application " | TOPICAL_OINTMENT | Freq: Two times a day (BID) | CUTANEOUS | Status: DC | PRN

## 2015-01-13 NOTE — Progress Notes (Signed)
Patient ID: Judy Evans, female   DOB: 29-Dec-1970, 44 y.o.   MRN: [id number]   [facility] Visit  Patient name: Judy Evans MRN [id number]  Date of birth: 1971-03-02  CC & HPI:  Judy Evans is a 44 y.o. Caucasian female presenting today with report of constant hot flashes that last 'all day long', pelvic pain/pressure, vulvar irritation, dysuria. She had partial hysterectomy in 2005 by Judy Evans for 'pre-cancerous cells', has had a normal pap since and was told she could stop paps. She had BSO in 2008. She has not been sexually active x 90yrs. Doesn't really notice any vaginal dryness or mood changes. PCP is Dr. Zada Evans, who she sees regularly. Did have a cardiac catheterization through Rt groin and had a large hematoma on Rt thigh x 4 months. States during entire procedure she felt vaginal pain.   Pertinent History Reviewed:  Medical & Surgical Hx:   Past Medical History  Diagnosis Date  . Diabetes mellitus without complication   . Hypertension   . Migraines   . Aphasia   . Psoriasis   . Arthritis   . Fibromyalgia   . COPD (chronic obstructive pulmonary disease)   . Hyperlipidemia    Past Surgical History  Procedure Laterality Date  . Cardiac surgery    . Abdominal hysterectomy    . Cesarean section    . Tonsillectomy    . Cholecystectomy    . Rotator cuff surgery    . Foot surgery Bilateral    Medications: Reviewed & Updated - see associated section Social History: Reviewed -  reports that she has been smoking Cigarettes.  She has a 30 pack-year smoking history. She has never used smokeless tobacco.  Objective Findings:  Vitals: BP 120/62 mmHg  Pulse 80  Ht 5' 1.25" (1.556 m)  Wt 249 lb (112.946 kg)  BMI 46.65 kg/m2  Physical Examination: General appearance - alert, well appearing, and in no distress Chest - CTAB Heart - normal rate, regular rhythm, normal S1, S2, no murmurs, rubs, clicks or gallops, normal rate and regular rhythm Abdomen -  soft, nontender, nondistended, no masses or organomegaly Pelvic - anterior apex of vulva slightly erythematous and irritated, remaining external genitalia normal. Vagina atrophic and slightly erythematous, cervix surgically absent, vaginal cuff appears normal. Scan amount creamy white nonodorous d/c. Bimanual exam 'tender on the inside' per pt- no masses/abnormalites palpated.  Skin - wide spread psoriasis  Results for orders placed or performed in visit on 01/13/15 (from the past 24 hour(s))  POCT Urinalysis Dipstick   Collection Time: 01/13/15  9:04 AM  Result Value Ref Range   Color, UA     Clarity, UA     Glucose, UA neg    Bilirubin, UA     Ketones, UA neg    Spec Grav, UA     Blood, UA neg    pH, UA     Protein, UA neg    Urobilinogen, UA     Nitrite, UA neg    Leukocytes, UA Negative   POCT Wet Prep [facility] [place])   Collection Time: 01/13/15  9:40 AM  Result Value Ref Range   Source Wet Prep POC vaginal    WBC, Wet Prep HPF POC none    Bacteria Wet Prep HPF POC none    BACTERIA WET PREP MORPHOLOGY POC     Clue Cells Wet Prep HPF POC None    Clue Cells Wet Prep Whiff POC Negative Whiff  Yeast Wet Prep HPF POC None    KOH Wet Prep POC     Trichomonas Wet Prep HPF POC none      Assessment & Plan:  A:   Hot flashes  Vaginal pain  Pelvic pain/pressure  Dysuria  S/P hysterectomy/BSO P:  Urine for gc/ct  Rx mytrex (sent separately as triamcinolone and nystatin d/t insurance) for vulvovaginal irritation   F/U 1wk for pelvic u/s and f/u w/ Judy Evans to discuss results and possible HRT   Tawnya Crook CNM, WHNP-BC 01/13/2015 9:40 AM

## 2015-01-15 LAB — GC/CHLAMYDIA PROBE AMP
Chlamydia trachomatis, NAA: NEGATIVE
Neisseria gonorrhoeae by PCR: NEGATIVE

## 2015-01-22 ENCOUNTER — Ambulatory Visit (INDEPENDENT_AMBULATORY_CARE_PROVIDER_SITE_OTHER): Payer: Medicare Other | Admitting: Adult Health

## 2015-01-22 ENCOUNTER — Ambulatory Visit (INDEPENDENT_AMBULATORY_CARE_PROVIDER_SITE_OTHER): Payer: Medicare Other

## 2015-01-22 ENCOUNTER — Encounter: Payer: Self-pay | Admitting: Adult Health

## 2015-01-22 VITALS — BP 120/76 | HR 68 | Ht 61.25 in | Wt 250.0 lb

## 2015-01-22 DIAGNOSIS — R102 Pelvic and perineal pain: Secondary | ICD-10-CM

## 2015-01-22 DIAGNOSIS — E8941 Symptomatic postprocedural ovarian failure: Secondary | ICD-10-CM

## 2015-01-22 DIAGNOSIS — N9489 Other specified conditions associated with female genital organs and menstrual cycle: Secondary | ICD-10-CM | POA: Diagnosis not present

## 2015-01-22 DIAGNOSIS — F489 Nonpsychotic mental disorder, unspecified: Secondary | ICD-10-CM | POA: Diagnosis not present

## 2015-01-22 DIAGNOSIS — N958 Other specified menopausal and perimenopausal disorders: Secondary | ICD-10-CM

## 2015-01-22 DIAGNOSIS — R4589 Other symptoms and signs involving emotional state: Secondary | ICD-10-CM | POA: Insufficient documentation

## 2015-01-22 HISTORY — DX: Other symptoms and signs involving emotional state: R45.89

## 2015-01-22 MED ORDER — PAROXETINE HCL 10 MG PO TABS: 10.0000 mg | ORAL_TABLET | Freq: Every day | ORAL | Status: DC

## 2015-01-22 NOTE — Progress Notes (Signed)
Subjective:     Patient ID: Judy Evans, female   DOB: 1970-10-14, 44 y.o.   MRN: [id number]  HPI Lucy is a 44 year old white female in to discuss possible estrogen replacement.She was seen last week by Knute Neu CNM for complaints of hot flashes,pelvic pain, vulva irritation and dysuria.She is sp hysterectomy and BSO.She had cardiac cath and had hematoma after and she says she has vein injury and is seeing vascular surgeon.She had pelvic US today to assess pain.She has history of IBS too.  Review of Systems Patient denies any headaches, hearing loss, fatigue, blurred vision, shortness of breath, chest pain, problems with bowel movements. No joint pain.See HPI for positives.  Reviewed past medical,surgical, social and family history. Reviewed medications and allergies.     Objective:   Physical Exam BP 120/76 mmHg  Pulse 68  Ht 5' 1.25" (1.556 m)  Wt 250 lb (113.399 kg)  BMI 46.84 kg/m2   Skin warm and dry.  Lungs: clear to ausculation bilaterally. Cardiovascular: regular rate and rhythm. Pelvic US was normal. Discussed that with all her health issues, estrogen replacement, wwould not be her best choice, will try paxil.Discussed with Dr Elonda Husky and he agrees.  Assessment:     Hot flashes  Moody Pelvic pain    Plan:     Rx paxil 10 mg #30 1 daily with 3 refills Try luvena for vaginal moisture Return in 4 weeks for follow up   Talk with PCP, as pain may be IBS, consider sleeve by pass for weight loss Stop black cohosh

## 2015-01-22 NOTE — Patient Instructions (Addendum)
Menopause Menopause is the normal time of life when menstrual periods stop completely. Menopause is complete when you have missed 12 consecutive menstrual periods. It usually occurs between the ages of 48 years and 55 years. Very rarely does a woman develop menopause before the age of 40 years. At menopause, your ovaries stop producing the female hormones estrogen and progesterone. This can cause undesirable symptoms and also affect your health. Sometimes the symptoms may occur 4-5 years before the menopause begins. There is no relationship between menopause and:  Oral contraceptives.  Number of children you had.  Race.  The age your menstrual periods started (menarche). Heavy smokers and very thin women may develop menopause earlier in life. CAUSES  The ovaries stop producing the female hormones estrogen and progesterone.  Other causes include:  Surgery to remove both ovaries.  The ovaries stop functioning for no known reason.  Tumors of the pituitary gland in the brain.  Medical disease that affects the ovaries and hormone production.  Radiation treatment to the abdomen or pelvis.  Chemotherapy that affects the ovaries. SYMPTOMS   Hot flashes.  Night sweats.  Decrease in sex drive.  Vaginal dryness and thinning of the vagina causing painful intercourse.  Dryness of the skin and developing wrinkles.  Headaches.  Tiredness.  Irritability.  Memory problems.  Weight gain.  Bladder infections.  Hair growth of the face and chest.  Infertility. More serious symptoms include:  Loss of bone (osteoporosis) causing breaks (fractures).  Depression.  Hardening and narrowing of the arteries (atherosclerosis) causing heart attacks and strokes. DIAGNOSIS   When the menstrual periods have stopped for 12 straight months.  Physical exam.  Hormone studies of the blood. TREATMENT  There are many treatment choices and nearly as many questions about them. The  decisions to treat or not to treat menopausal changes is an individual choice made with your health care provider. Your health care provider can discuss the treatments with you. Together, you can decide which treatment will work best for you. Your treatment choices may include:   Hormone therapy (estrogen and progesterone).  Non-hormonal medicines.  Treating the individual symptoms with medicine (for example antidepressants for depression).  Herbal medicines that may help specific symptoms.  Counseling by a psychiatrist or psychologist.  Group therapy.  Lifestyle changes including:  Eating healthy.  Regular exercise.  Limiting caffeine and alcohol.  Stress management and meditation.  No treatment. HOME CARE INSTRUCTIONS   Take the medicine your health care provider gives you as directed.  Get plenty of sleep and rest.  Exercise regularly.  Eat a diet that contains calcium (good for the bones) and soy products (acts like estrogen hormone).  Avoid alcoholic beverages.  Do not smoke.  If you have hot flashes, dress in layers.  Take supplements, calcium, and vitamin D to strengthen bones.  You can use over-the-counter lubricants or moisturizers for vaginal dryness.  Group therapy is sometimes very helpful.  Acupuncture may be helpful in some cases. SEEK MEDICAL CARE IF:   You are not sure you are in menopause.  You are having menopausal symptoms and need advice and treatment.  You are still having menstrual periods after age 55 years.  You have pain with intercourse.  Menopause is complete (no menstrual period for 12 months) and you develop vaginal bleeding.  You need a referral to a specialist (gynecologist, psychiatrist, or psychologist) for treatment. SEEK IMMEDIATE MEDICAL CARE IF:   You have severe depression.  You have excessive vaginal bleeding.    You fell and think you have a broken bone.  You have pain when you urinate.  You develop leg or  chest pain.  You have a fast pounding heart beat (palpitations).  You have severe headaches.  You develop vision problems.  You feel a lump in your breast.  You have abdominal pain or severe indigestion. Document Released: 12/02/2003 Document Revised: 05/14/2013 Document Reviewed: 04/10/2013 [facility] Patient Information 2015 [place], [place]. This information is not intended to replace advice given to you by your health care provider. Make sure you discuss any questions you have with your health care provider. Take paxil 10 mg 1 daily  Try luvena Follow up in 4 weeks Stop black cohosh Talk PCP, think sleeve surgery  Decrease cigarettes

## 2015-01-22 NOTE — Progress Notes (Signed)
US Pelvis T/A & T/V- s/p hysterectomy and bilat oophorectomy, normal vag cuff, normal bilat adnexa's,pt had pain on rt during TV US.

## 2015-01-23 DIAGNOSIS — F17200 Nicotine dependence, unspecified, uncomplicated: Secondary | ICD-10-CM | POA: Diagnosis not present

## 2015-01-23 DIAGNOSIS — M797 Fibromyalgia: Secondary | ICD-10-CM | POA: Diagnosis not present

## 2015-01-23 DIAGNOSIS — J449 Chronic obstructive pulmonary disease, unspecified: Secondary | ICD-10-CM | POA: Diagnosis not present

## 2015-01-23 DIAGNOSIS — R079 Chest pain, unspecified: Secondary | ICD-10-CM | POA: Diagnosis not present

## 2015-01-23 DIAGNOSIS — Z7951 Long term (current) use of inhaled steroids: Secondary | ICD-10-CM | POA: Diagnosis not present

## 2015-01-23 DIAGNOSIS — R05 Cough: Secondary | ICD-10-CM | POA: Diagnosis not present

## 2015-01-23 DIAGNOSIS — I10 Essential (primary) hypertension: Secondary | ICD-10-CM | POA: Diagnosis not present

## 2015-01-23 DIAGNOSIS — Z79899 Other long term (current) drug therapy: Secondary | ICD-10-CM | POA: Diagnosis not present

## 2015-01-23 DIAGNOSIS — R0789 Other chest pain: Secondary | ICD-10-CM | POA: Diagnosis not present

## 2015-01-23 DIAGNOSIS — R0989 Other specified symptoms and signs involving the circulatory and respiratory systems: Secondary | ICD-10-CM | POA: Diagnosis not present

## 2015-01-23 DIAGNOSIS — K219 Gastro-esophageal reflux disease without esophagitis: Secondary | ICD-10-CM | POA: Diagnosis not present

## 2015-01-23 DIAGNOSIS — R1010 Upper abdominal pain, unspecified: Secondary | ICD-10-CM | POA: Diagnosis not present

## 2015-01-23 DIAGNOSIS — E119 Type 2 diabetes mellitus without complications: Secondary | ICD-10-CM | POA: Diagnosis not present

## 2015-01-23 DIAGNOSIS — J45909 Unspecified asthma, uncomplicated: Secondary | ICD-10-CM | POA: Diagnosis not present

## 2015-01-23 DIAGNOSIS — R1013 Epigastric pain: Secondary | ICD-10-CM | POA: Diagnosis not present

## 2015-02-18 ENCOUNTER — Other Ambulatory Visit: Payer: Self-pay | Admitting: *Deleted

## 2015-02-19 ENCOUNTER — Encounter: Payer: Self-pay | Admitting: Adult Health

## 2015-02-19 ENCOUNTER — Ambulatory Visit (INDEPENDENT_AMBULATORY_CARE_PROVIDER_SITE_OTHER): Payer: Medicare Other | Admitting: Adult Health

## 2015-02-19 VITALS — BP 102/60 | HR 80 | Ht 62.0 in | Wt 238.0 lb

## 2015-02-19 DIAGNOSIS — R809 Proteinuria, unspecified: Secondary | ICD-10-CM | POA: Diagnosis not present

## 2015-02-19 DIAGNOSIS — N958 Other specified menopausal and perimenopausal disorders: Secondary | ICD-10-CM | POA: Diagnosis not present

## 2015-02-19 DIAGNOSIS — F489 Nonpsychotic mental disorder, unspecified: Secondary | ICD-10-CM | POA: Diagnosis not present

## 2015-02-19 DIAGNOSIS — E8941 Symptomatic postprocedural ovarian failure: Secondary | ICD-10-CM

## 2015-02-19 DIAGNOSIS — R309 Painful micturition, unspecified: Secondary | ICD-10-CM

## 2015-02-19 DIAGNOSIS — R4589 Other symptoms and signs involving emotional state: Secondary | ICD-10-CM

## 2015-02-19 HISTORY — DX: Painful micturition, unspecified: R30.9

## 2015-02-19 LAB — POCT URINALYSIS DIPSTICK
Glucose, UA: NEGATIVE
Leukocytes, UA: NEGATIVE
Nitrite, UA: NEGATIVE
RBC UA: NEGATIVE

## 2015-02-19 MED ORDER — PAROXETINE HCL 10 MG PO TABS: 10.0000 mg | ORAL_TABLET | Freq: Every day | ORAL | Status: DC

## 2015-02-19 MED ORDER — SULFAMETHOXAZOLE-TRIMETHOPRIM 800-160 MG PO TABS: 1.0000 | ORAL_TABLET | Freq: Two times a day (BID) | ORAL | Status: DC

## 2015-02-19 NOTE — Progress Notes (Signed)
Subjective:     Patient ID: Judy Evans, female   DOB: Jul 27, 1971, 44 y.o.   MRN: [id number]  HPI Lyberti is a 44 year old white female back in follow up of starting paxil for hot flashes and moodiness and she says she is much better.She does complain of stinging with urination and urine looks it has blood in it.  Review of Systems  Patient denies any headaches, hearing loss, fatigue, blurred vision, shortness of breath, chest pain, abdominal pain, problems with bowel movements, or intercourse. No joint pain or mood swings.See HPI for positives.  Reviewed past medical,surgical, social and family history. Reviewed medications and allergies.     Objective:   Physical Exam BP 102/60 mmHg  Pulse 80  Ht 5\' 2"  (1.575 m)  Wt 238 lb (107.956 kg)  BMI 43.52 kg/m2urine trace protein, No CVAT and abdomen soft non tender, bladder not tender.She wants to continue the paxil.    Assessment:     Pain with urination Hot flashes Moody Protein in urine     Plan:     UA C&S sent Rx septra ds 1 bid x 7 days Refilled paxil 10 mg #30 take 1 daily with 6 refills Follow up in 6 months Push fluids and review handout on UTI

## 2015-02-19 NOTE — Patient Instructions (Addendum)
Increase water Take septra ds Follow up in 6 months Urinary Tract Infection Urinary tract infections (UTIs) can develop anywhere along your urinary tract. Your urinary tract is your body's drainage system for removing wastes and extra water. Your urinary tract includes two kidneys, two ureters, a bladder, and a urethra. Your kidneys are a pair of bean-shaped organs. Each kidney is about the size of your fist. They are located below your ribs, one on each side of your spine. CAUSES Infections are caused by microbes, which are microscopic organisms, including fungi, viruses, and bacteria. These organisms are so small that they can only be seen through a microscope. Bacteria are the microbes that most commonly cause UTIs. SYMPTOMS  Symptoms of UTIs may vary by age and gender of the patient and by the location of the infection. Symptoms in young women typically include a frequent and intense urge to urinate and a painful, burning feeling in the bladder or urethra during urination. Older women and men are more likely to be tired, shaky, and weak and have muscle aches and abdominal pain. A fever may mean the infection is in your kidneys. Other symptoms of a kidney infection include pain in your back or sides below the ribs, nausea, and vomiting. DIAGNOSIS To diagnose a UTI, your caregiver will ask you about your symptoms. Your caregiver also will ask to provide a urine sample. The urine sample will be tested for bacteria and white blood cells. White blood cells are made by your body to help fight infection. TREATMENT  Typically, UTIs can be treated with medication. Because most UTIs are caused by a bacterial infection, they usually can be treated with the use of antibiotics. The choice of antibiotic and length of treatment depend on your symptoms and the type of bacteria causing your infection. HOME CARE INSTRUCTIONS  If you were prescribed antibiotics, take them exactly as your caregiver instructs you.  Finish the medication even if you feel better after you have only taken some of the medication.  Drink enough water and fluids to keep your urine clear or pale yellow.  Avoid caffeine, tea, and carbonated beverages. They tend to irritate your bladder.  Empty your bladder often. Avoid holding urine for long periods of time.  Empty your bladder before and after sexual intercourse.  After a bowel movement, women should cleanse from front to back. Use each tissue only once. SEEK MEDICAL CARE IF:   You have back pain.  You develop a fever.  Your symptoms do not begin to resolve within 3 days. SEEK IMMEDIATE MEDICAL CARE IF:   You have severe back pain or lower abdominal pain.  You develop chills.  You have nausea or vomiting.  You have continued burning or discomfort with urination. MAKE SURE YOU:   Understand these instructions.  Will watch your condition.  Will get help right away if you are not doing well or get worse. Document Released: 06/21/2005 Document Revised: 03/12/2012 Document Reviewed: 10/20/2011 [facility] Patient Information 2015 [place], [place]. This information is not intended to replace advice given to you by your health care provider. Make sure you discuss any questions you have with your health care provider.

## 2015-02-20 LAB — URINALYSIS, ROUTINE W REFLEX MICROSCOPIC
Bilirubin, UA: NEGATIVE
Glucose, UA: NEGATIVE
Ketones, UA: NEGATIVE
Nitrite, UA: NEGATIVE
PH UA: 5.5 (ref 5.0–7.5)
RBC, UA: NEGATIVE
SPEC GRAV UA: 1.028 (ref 1.005–1.030)
Urobilinogen, Ur: 0.2 mg/dL (ref 0.2–1.0)

## 2015-02-20 LAB — URINE CULTURE

## 2015-02-20 LAB — MICROSCOPIC EXAMINATION: Casts: NONE SEEN /lpf

## 2015-03-01 DIAGNOSIS — E784 Other hyperlipidemia: Secondary | ICD-10-CM | POA: Diagnosis not present

## 2015-03-01 DIAGNOSIS — Z0000 Encounter for general adult medical examination without abnormal findings: Secondary | ICD-10-CM | POA: Diagnosis not present

## 2015-03-01 DIAGNOSIS — E1165 Type 2 diabetes mellitus with hyperglycemia: Secondary | ICD-10-CM | POA: Diagnosis not present

## 2015-03-01 DIAGNOSIS — K210 Gastro-esophageal reflux disease with esophagitis: Secondary | ICD-10-CM | POA: Diagnosis not present

## 2015-03-17 DIAGNOSIS — M47812 Spondylosis without myelopathy or radiculopathy, cervical region: Secondary | ICD-10-CM | POA: Diagnosis not present

## 2015-03-17 DIAGNOSIS — Z79891 Long term (current) use of opiate analgesic: Secondary | ICD-10-CM | POA: Diagnosis not present

## 2015-03-17 DIAGNOSIS — M47816 Spondylosis without myelopathy or radiculopathy, lumbar region: Secondary | ICD-10-CM | POA: Diagnosis not present

## 2015-03-17 DIAGNOSIS — M5136 Other intervertebral disc degeneration, lumbar region: Secondary | ICD-10-CM | POA: Diagnosis not present

## 2015-03-17 DIAGNOSIS — M9901 Segmental and somatic dysfunction of cervical region: Secondary | ICD-10-CM | POA: Diagnosis not present

## 2015-03-17 DIAGNOSIS — M797 Fibromyalgia: Secondary | ICD-10-CM | POA: Diagnosis not present

## 2015-03-25 DIAGNOSIS — S161XXA Strain of muscle, fascia and tendon at neck level, initial encounter: Secondary | ICD-10-CM | POA: Diagnosis not present

## 2015-05-03 ENCOUNTER — Ambulatory Visit: Payer: Medicare Other | Admitting: Adult Health

## 2015-05-03 ENCOUNTER — Encounter: Payer: Self-pay | Admitting: Adult Health

## 2015-05-06 DIAGNOSIS — M5136 Other intervertebral disc degeneration, lumbar region: Secondary | ICD-10-CM | POA: Diagnosis not present

## 2015-05-06 DIAGNOSIS — M461 Sacroiliitis, not elsewhere classified: Secondary | ICD-10-CM | POA: Diagnosis not present

## 2015-05-06 DIAGNOSIS — M797 Fibromyalgia: Secondary | ICD-10-CM | POA: Diagnosis not present

## 2015-05-06 DIAGNOSIS — M47816 Spondylosis without myelopathy or radiculopathy, lumbar region: Secondary | ICD-10-CM | POA: Diagnosis not present

## 2015-05-10 DIAGNOSIS — M25532 Pain in left wrist: Secondary | ICD-10-CM | POA: Diagnosis not present

## 2015-05-10 DIAGNOSIS — M25531 Pain in right wrist: Secondary | ICD-10-CM | POA: Diagnosis not present

## 2015-05-10 DIAGNOSIS — M65319 Trigger thumb, unspecified thumb: Secondary | ICD-10-CM | POA: Diagnosis not present

## 2015-05-10 DIAGNOSIS — M792 Neuralgia and neuritis, unspecified: Secondary | ICD-10-CM | POA: Diagnosis not present

## 2015-05-13 DIAGNOSIS — M47812 Spondylosis without myelopathy or radiculopathy, cervical region: Secondary | ICD-10-CM | POA: Diagnosis not present

## 2015-05-13 DIAGNOSIS — M792 Neuralgia and neuritis, unspecified: Secondary | ICD-10-CM | POA: Diagnosis not present

## 2015-05-13 DIAGNOSIS — M2578 Osteophyte, vertebrae: Secondary | ICD-10-CM | POA: Diagnosis not present

## 2015-05-13 DIAGNOSIS — M5022 Other cervical disc displacement, mid-cervical region: Secondary | ICD-10-CM | POA: Diagnosis not present

## 2015-05-13 DIAGNOSIS — M9971 Connective tissue and disc stenosis of intervertebral foramina of cervical region: Secondary | ICD-10-CM | POA: Diagnosis not present

## 2015-05-13 DIAGNOSIS — M5021 Other cervical disc displacement,  high cervical region: Secondary | ICD-10-CM | POA: Diagnosis not present

## 2015-05-18 DIAGNOSIS — M792 Neuralgia and neuritis, unspecified: Secondary | ICD-10-CM | POA: Diagnosis not present

## 2015-05-27 DIAGNOSIS — F419 Anxiety disorder, unspecified: Secondary | ICD-10-CM | POA: Diagnosis not present

## 2015-05-27 DIAGNOSIS — M5136 Other intervertebral disc degeneration, lumbar region: Secondary | ICD-10-CM | POA: Diagnosis not present

## 2015-05-27 DIAGNOSIS — M461 Sacroiliitis, not elsewhere classified: Secondary | ICD-10-CM | POA: Diagnosis not present

## 2015-05-27 DIAGNOSIS — F17210 Nicotine dependence, cigarettes, uncomplicated: Secondary | ICD-10-CM | POA: Diagnosis not present

## 2015-05-27 DIAGNOSIS — L4050 Arthropathic psoriasis, unspecified: Secondary | ICD-10-CM | POA: Diagnosis not present

## 2015-05-27 DIAGNOSIS — K589 Irritable bowel syndrome without diarrhea: Secondary | ICD-10-CM | POA: Diagnosis not present

## 2015-05-27 DIAGNOSIS — Z79891 Long term (current) use of opiate analgesic: Secondary | ICD-10-CM | POA: Diagnosis not present

## 2015-05-27 DIAGNOSIS — Z888 Allergy status to other drugs, medicaments and biological substances status: Secondary | ICD-10-CM | POA: Diagnosis not present

## 2015-05-27 DIAGNOSIS — M47816 Spondylosis without myelopathy or radiculopathy, lumbar region: Secondary | ICD-10-CM | POA: Diagnosis not present

## 2015-05-27 DIAGNOSIS — F319 Bipolar disorder, unspecified: Secondary | ICD-10-CM | POA: Diagnosis not present

## 2015-05-27 DIAGNOSIS — Z79899 Other long term (current) drug therapy: Secondary | ICD-10-CM | POA: Diagnosis not present

## 2015-05-27 DIAGNOSIS — M797 Fibromyalgia: Secondary | ICD-10-CM | POA: Diagnosis not present

## 2015-05-27 DIAGNOSIS — G8929 Other chronic pain: Secondary | ICD-10-CM | POA: Diagnosis not present

## 2015-05-27 DIAGNOSIS — Z880 Allergy status to penicillin: Secondary | ICD-10-CM | POA: Diagnosis not present

## 2015-06-01 DIAGNOSIS — F418 Other specified anxiety disorders: Secondary | ICD-10-CM | POA: Diagnosis not present

## 2015-06-01 DIAGNOSIS — L409 Psoriasis, unspecified: Secondary | ICD-10-CM | POA: Diagnosis not present

## 2015-06-01 DIAGNOSIS — Z79891 Long term (current) use of opiate analgesic: Secondary | ICD-10-CM | POA: Diagnosis not present

## 2015-06-01 DIAGNOSIS — Z7951 Long term (current) use of inhaled steroids: Secondary | ICD-10-CM | POA: Diagnosis not present

## 2015-06-01 DIAGNOSIS — Z6841 Body Mass Index (BMI) 40.0 and over, adult: Secondary | ICD-10-CM | POA: Diagnosis not present

## 2015-06-01 DIAGNOSIS — J45909 Unspecified asthma, uncomplicated: Secondary | ICD-10-CM | POA: Diagnosis not present

## 2015-06-01 DIAGNOSIS — Z79899 Other long term (current) drug therapy: Secondary | ICD-10-CM | POA: Diagnosis not present

## 2015-06-01 DIAGNOSIS — Z7982 Long term (current) use of aspirin: Secondary | ICD-10-CM | POA: Diagnosis not present

## 2015-06-01 DIAGNOSIS — E119 Type 2 diabetes mellitus without complications: Secondary | ICD-10-CM | POA: Diagnosis not present

## 2015-06-01 DIAGNOSIS — R079 Chest pain, unspecified: Secondary | ICD-10-CM | POA: Diagnosis not present

## 2015-06-02 DIAGNOSIS — Z6841 Body Mass Index (BMI) 40.0 and over, adult: Secondary | ICD-10-CM | POA: Diagnosis not present

## 2015-06-02 DIAGNOSIS — L409 Psoriasis, unspecified: Secondary | ICD-10-CM | POA: Diagnosis not present

## 2015-06-02 DIAGNOSIS — F418 Other specified anxiety disorders: Secondary | ICD-10-CM | POA: Diagnosis not present

## 2015-06-02 DIAGNOSIS — J45909 Unspecified asthma, uncomplicated: Secondary | ICD-10-CM | POA: Diagnosis not present

## 2015-06-02 DIAGNOSIS — R079 Chest pain, unspecified: Secondary | ICD-10-CM | POA: Diagnosis not present

## 2015-06-09 ENCOUNTER — Telehealth: Payer: Self-pay | Admitting: Adult Health

## 2015-06-09 NOTE — Telephone Encounter (Signed)
Left message x 1. JSY 

## 2015-06-11 NOTE — Telephone Encounter (Signed)
Left message x 2. JSY 

## 2015-06-14 ENCOUNTER — Ambulatory Visit: Payer: Medicare Other | Admitting: Adult Health

## 2015-06-14 NOTE — Telephone Encounter (Signed)
Left message x 3. JSY 

## 2015-06-15 NOTE — Telephone Encounter (Signed)
3 messages left for pt to call. Pt never returned call.  Encounter closed. [place]

## 2015-06-18 ENCOUNTER — Ambulatory Visit (INDEPENDENT_AMBULATORY_CARE_PROVIDER_SITE_OTHER): Payer: Medicare Other | Admitting: Adult Health

## 2015-06-18 ENCOUNTER — Encounter: Payer: Self-pay | Admitting: Adult Health

## 2015-06-18 VITALS — BP 100/72 | HR 76 | Ht 62.0 in | Wt 225.0 lb

## 2015-06-18 DIAGNOSIS — R1032 Left lower quadrant pain: Secondary | ICD-10-CM | POA: Diagnosis not present

## 2015-06-18 DIAGNOSIS — R140 Abdominal distension (gaseous): Secondary | ICD-10-CM | POA: Diagnosis not present

## 2015-06-18 HISTORY — DX: Left lower quadrant pain: R10.32

## 2015-06-18 NOTE — Progress Notes (Signed)
Subjective:     Patient ID: Judy Evans, female   DOB: 1971-02-12, 44 y.o.   MRN: [id number]  HPI Judy Evans is a 44 year old white female in with LLQ pain and bloating, was seen in May and had normal [place].  Review of Systems Patient denies any headaches, hearing loss, fatigue, blurred vision, shortness of breath, chest pain, problems with bowel movements, urination, or intercourse. No joint pain or mood swings.+abdominal pain and bloating, still has some hot flashes, but better Reviewed past medical,surgical, social and family history. Reviewed medications and allergies.     Objective:   Physical Exam BP 100/72 mmHg  Pulse 76  Ht 5\' 2"  (1.575 m)  Wt 225 lb (102.059 kg)  BMI 41.14 kg/m2 Skin warm and dry.Pelvic: external genitalia is normal in appearance no lesions, vagina: scant discharge without odor,urethra has no lesions or masses noted, cervix and uterus are absent, no masses felt, adnexa: no masses, LLQ tenderness noted. Bladder is non tender and no masses felt,had normal [place] in May, I think this may be bowel related,not gyn.    Assessment:     LLQ pain  Bloating     Plan:     Needs to see GI, F/U with PCP for referral

## 2015-06-18 NOTE — Patient Instructions (Signed)
Needs to see GI, get referral from PCP  Follow up Dr Zada Girt

## 2015-06-30 DIAGNOSIS — M47816 Spondylosis without myelopathy or radiculopathy, lumbar region: Secondary | ICD-10-CM | POA: Diagnosis not present

## 2015-06-30 DIAGNOSIS — M5136 Other intervertebral disc degeneration, lumbar region: Secondary | ICD-10-CM | POA: Diagnosis not present

## 2015-06-30 DIAGNOSIS — M545 Low back pain: Secondary | ICD-10-CM | POA: Diagnosis not present

## 2015-06-30 DIAGNOSIS — M797 Fibromyalgia: Secondary | ICD-10-CM | POA: Diagnosis not present

## 2015-07-08 DIAGNOSIS — R5383 Other fatigue: Secondary | ICD-10-CM | POA: Diagnosis not present

## 2015-07-08 DIAGNOSIS — I201 Angina pectoris with documented spasm: Secondary | ICD-10-CM | POA: Diagnosis not present

## 2015-07-12 DIAGNOSIS — M5126 Other intervertebral disc displacement, lumbar region: Secondary | ICD-10-CM | POA: Diagnosis not present

## 2015-07-12 DIAGNOSIS — M25561 Pain in right knee: Secondary | ICD-10-CM | POA: Diagnosis not present

## 2015-07-12 DIAGNOSIS — R937 Abnormal findings on diagnostic imaging of other parts of musculoskeletal system: Secondary | ICD-10-CM | POA: Diagnosis not present

## 2015-07-12 DIAGNOSIS — M25562 Pain in left knee: Secondary | ICD-10-CM | POA: Diagnosis not present

## 2015-07-12 DIAGNOSIS — M47816 Spondylosis without myelopathy or radiculopathy, lumbar region: Secondary | ICD-10-CM | POA: Diagnosis not present

## 2015-07-15 DIAGNOSIS — G43919 Migraine, unspecified, intractable, without status migrainosus: Secondary | ICD-10-CM | POA: Diagnosis not present

## 2015-07-15 DIAGNOSIS — J019 Acute sinusitis, unspecified: Secondary | ICD-10-CM | POA: Diagnosis not present

## 2015-07-30 DIAGNOSIS — M797 Fibromyalgia: Secondary | ICD-10-CM | POA: Diagnosis not present

## 2015-07-30 DIAGNOSIS — M545 Low back pain: Secondary | ICD-10-CM | POA: Diagnosis not present

## 2015-07-30 DIAGNOSIS — M5136 Other intervertebral disc degeneration, lumbar region: Secondary | ICD-10-CM | POA: Diagnosis not present

## 2015-07-30 DIAGNOSIS — M47816 Spondylosis without myelopathy or radiculopathy, lumbar region: Secondary | ICD-10-CM | POA: Diagnosis not present

## 2015-08-27 DIAGNOSIS — M2242 Chondromalacia patellae, left knee: Secondary | ICD-10-CM | POA: Diagnosis not present

## 2015-08-27 DIAGNOSIS — M545 Low back pain: Secondary | ICD-10-CM | POA: Diagnosis not present

## 2015-08-27 DIAGNOSIS — M47816 Spondylosis without myelopathy or radiculopathy, lumbar region: Secondary | ICD-10-CM | POA: Diagnosis not present

## 2015-08-27 DIAGNOSIS — M2241 Chondromalacia patellae, right knee: Secondary | ICD-10-CM | POA: Diagnosis not present

## 2015-10-11 DIAGNOSIS — F17200 Nicotine dependence, unspecified, uncomplicated: Secondary | ICD-10-CM | POA: Diagnosis not present

## 2015-10-11 DIAGNOSIS — J45909 Unspecified asthma, uncomplicated: Secondary | ICD-10-CM | POA: Diagnosis not present

## 2015-10-11 DIAGNOSIS — I10 Essential (primary) hypertension: Secondary | ICD-10-CM | POA: Diagnosis not present

## 2015-10-11 DIAGNOSIS — M79605 Pain in left leg: Secondary | ICD-10-CM | POA: Diagnosis not present

## 2015-10-11 DIAGNOSIS — Z79899 Other long term (current) drug therapy: Secondary | ICD-10-CM | POA: Diagnosis not present

## 2015-10-11 DIAGNOSIS — S80812A Abrasion, left lower leg, initial encounter: Secondary | ICD-10-CM | POA: Diagnosis not present

## 2015-10-11 DIAGNOSIS — M79652 Pain in left thigh: Secondary | ICD-10-CM | POA: Diagnosis not present

## 2015-10-11 DIAGNOSIS — E119 Type 2 diabetes mellitus without complications: Secondary | ICD-10-CM | POA: Diagnosis not present

## 2015-10-11 DIAGNOSIS — Z7951 Long term (current) use of inhaled steroids: Secondary | ICD-10-CM | POA: Diagnosis not present

## 2015-10-11 DIAGNOSIS — J449 Chronic obstructive pulmonary disease, unspecified: Secondary | ICD-10-CM | POA: Diagnosis not present

## 2015-10-27 ENCOUNTER — Ambulatory Visit: Payer: Medicare Other | Admitting: Pediatrics

## 2015-11-01 ENCOUNTER — Encounter: Payer: Self-pay | Admitting: Pediatrics

## 2015-11-01 ENCOUNTER — Ambulatory Visit (INDEPENDENT_AMBULATORY_CARE_PROVIDER_SITE_OTHER): Payer: Medicare Other | Admitting: Pediatrics

## 2015-11-01 ENCOUNTER — Ambulatory Visit (INDEPENDENT_AMBULATORY_CARE_PROVIDER_SITE_OTHER): Payer: Medicare Other

## 2015-11-01 VITALS — BP 105/75 | HR 76 | Temp 98.3°F | Ht 62.0 in | Wt 225.4 lb

## 2015-11-01 DIAGNOSIS — R102 Pelvic and perineal pain: Secondary | ICD-10-CM | POA: Diagnosis not present

## 2015-11-01 DIAGNOSIS — I10 Essential (primary) hypertension: Secondary | ICD-10-CM | POA: Diagnosis not present

## 2015-11-01 DIAGNOSIS — J309 Allergic rhinitis, unspecified: Secondary | ICD-10-CM | POA: Diagnosis not present

## 2015-11-01 DIAGNOSIS — K219 Gastro-esophageal reflux disease without esophagitis: Secondary | ICD-10-CM | POA: Diagnosis not present

## 2015-11-01 DIAGNOSIS — J449 Chronic obstructive pulmonary disease, unspecified: Secondary | ICD-10-CM | POA: Diagnosis not present

## 2015-11-01 DIAGNOSIS — R1012 Left upper quadrant pain: Secondary | ICD-10-CM

## 2015-11-01 DIAGNOSIS — L409 Psoriasis, unspecified: Secondary | ICD-10-CM

## 2015-11-01 DIAGNOSIS — R300 Dysuria: Secondary | ICD-10-CM

## 2015-11-01 DIAGNOSIS — M79643 Pain in unspecified hand: Secondary | ICD-10-CM

## 2015-11-01 DIAGNOSIS — E119 Type 2 diabetes mellitus without complications: Secondary | ICD-10-CM | POA: Diagnosis not present

## 2015-11-01 LAB — POCT URINALYSIS DIPSTICK
BILIRUBIN UA: NEGATIVE
Blood, UA: NEGATIVE
GLUCOSE UA: NEGATIVE
Ketones, UA: NEGATIVE
Leukocytes, UA: NEGATIVE
NITRITE UA: NEGATIVE
PH UA: 5
Protein, UA: NEGATIVE
Spec Grav, UA: <=1.005
Urobilinogen, UA: NEGATIVE

## 2015-11-01 LAB — POCT UA - MICROSCOPIC ONLY
CRYSTALS, UR, HPF, POC: NEGATIVE
Casts, Ur, LPF, POC: NEGATIVE
MUCUS UA: NEGATIVE
RBC, URINE, MICROSCOPIC: NEGATIVE
Yeast, UA: NEGATIVE

## 2015-11-01 LAB — POCT GLYCOSYLATED HEMOGLOBIN (HGB A1C): HEMOGLOBIN A1C: 5.6

## 2015-11-01 MED ORDER — RANITIDINE HCL 300 MG PO TABS: 300.0000 mg | ORAL_TABLET | Freq: Two times a day (BID) | ORAL | Status: DC

## 2015-11-01 MED ORDER — ALBUTEROL SULFATE HFA 108 (90 BASE) MCG/ACT IN AERS: 2.0000 | INHALATION_SPRAY | RESPIRATORY_TRACT | Status: DC | PRN

## 2015-11-01 MED ORDER — CETIRIZINE HCL 10 MG PO TABS: 10.0000 mg | ORAL_TABLET | Freq: Every day | ORAL | Status: DC

## 2015-11-01 MED ORDER — METHOTREXATE 2.5 MG PO TABS: 15.0000 mg | ORAL_TABLET | ORAL | Status: DC

## 2015-11-01 NOTE — Progress Notes (Signed)
Subjective:    Patient ID: Judy Evans, female    DOB: 1970/11/18, 45 y.o.   MRN: [id number]  CC: New Patient (Initial Visit); Abdominal Pain; and Arthritis   HPI: Judy Evans is a 45 y.o. female presenting for New Patient (Initial Visit); Abdominal Pain; and Arthritis  Having L sided upper abd pain, moves to groin sometimes Also sometimes in R   Possible psoriatic arthritis, also with fibromyalgia  Psoriasis: was followed at [facility], seen for psoriasis. On methotrexate and triamcinolone cream. Does notice a difference being on the methotrexate, improved skin on it but still with significant rash. On MTX for past year. On humira in the past, helped some. Embril also helped some, pt doesn't remember if helped with joints or skin alone. Joint pains have been more recent, over past year.   Dysuria: ongoing for a couple months. Doesn't burn every time. Sometimes so painful doesn't want to urinate. Feels a lot of pressure.   Hands swell in the morning. Has tightness and tenderness at times.   Fibromyalgia: not on medicine now.  Constipation: takes fiber supplement, still constipated  Followed by pain doctor, for chronic pain in back, hips, hands Seen by rheumatology in the past  [facility] in [place] 5-6 months. S/p hysterectomy    Depression screen [facility] 2/9 11/01/2015  Decreased Interest 0  Down, Depressed, Hopeless 1  PHQ - 2 Score 1     ROS: All systems negative other than what is in HPI    Past Medical History Patient Active Problem List   Diagnosis Date Noted  . Allergic rhinitis 11/01/2015  . GERD (gastroesophageal reflux disease) 11/01/2015  . LLQ pain 06/18/2015  . Bloating 06/18/2015  . Pain with urination 02/19/2015  . Moody 01/22/2015  . Pelvic pain in female 01/13/2015  . Dysuria 01/13/2015  . Pelvic pressure in female 01/13/2015  . Vaginal pain 01/13/2015  . Hot flashes due to surgical menopause 01/13/2015  . Diabetes  ([place]) 01/13/2015  . Hypertension 01/13/2015  . Migraines 01/13/2015  . Psoriasis 01/13/2015  . Arthritis 01/13/2015  . Fibromyalgia 01/13/2015  . COPD (chronic obstructive pulmonary disease) ([place]) 01/13/2015  . Hyperlipidemia 01/13/2015   Family History  Problem Relation Age of Onset  . COPD Mother   . Diabetes Mother   . Osteoarthritis Mother   . Healthy Father   . Lupus Sister   . CAD Sister   . Diabetes Sister   . Early death Brother     pneumonia  . Asthma Daughter   . Asthma Son   . Cancer Maternal Grandmother     colon  . Arthritis Maternal Grandmother   . Diabetes Maternal Grandmother   . Dementia Maternal Grandmother   . Other Maternal Grandfather     brain tumor  . Cancer Paternal Grandmother     breast  . Cancer Paternal Grandfather     lung  . Factor V Leiden deficiency Sister    Social History   Social History  . Marital Status: Legally Separated    Spouse Name: N/A  . Number of Children: N/A  . Years of Education: N/A   Occupational History  . Not on file.   Social History Main Topics  . Smoking status: Current Every Day Smoker -- 1.50 packs/day for 30 years    Types: Cigarettes  . Smokeless tobacco: Never Used  . Alcohol Use: No  . Drug Use: No  . Sexual Activity: Not Currently    Birth  Control/ Protection: Surgical     Comment: hyst   Other Topics Concern  . Not on file   Social History Narrative     Current Outpatient Prescriptions  Medication Sig Dispense Refill  . acetaminophen (TYLENOL) 500 MG tablet Take 500 mg by mouth every 6 (six) hours as needed.    Marland Kitchen albuterol (PROVENTIL HFA;VENTOLIN HFA) 108 (90 Base) MCG/ACT inhaler Inhale 2 puffs into the lungs every 4 (four) hours as needed for wheezing or shortness of breath. 1 Inhaler 1  . cetirizine (ZYRTEC) 10 MG tablet Take 1 tablet (10 mg total) by mouth daily. 30 tablet 1  . fluticasone (FLONASE) 50 MCG/ACT nasal spray Place 2 sprays into both nostrils daily.   0  . gabapentin  (NEURONTIN) 800 MG tablet Take 800 mg by mouth 4 (four) times daily.    Marland Kitchen HYDROcodone-acetaminophen (NORCO) 7.5-325 MG tablet Take 1 tablet by mouth every 6 (six) hours as needed.  0  . metFORMIN (GLUCOPHAGE) 500 MG tablet Take 500 mg by mouth daily.    . methotrexate (RHEUMATREX) 2.5 MG tablet Take 6 tablets (15 mg total) by mouth once a week. 24 tablet 1  . nystatin ointment (MYCOSTATIN) Apply 1 application topically 2 (two) times daily as needed. 30 g 0  . Omega-3 Fatty Acids (FISH OIL) 1000 MG CAPS Take 1 capsule by mouth 3 (three) times daily.    . ranitidine (ZANTAC) 300 MG tablet Take 1 tablet (300 mg total) by mouth 2 (two) times daily. 30 tablet 4  . SUMAtriptan (IMITREX) 100 MG tablet TAKE 1 TABLET WITH FLUIDS AS SOON AS POSSIBLE AFTER ONSET OF MIGRAINE ATTACK/MAY REPEAT AFTER 2 HRS  2  . tiZANidine (ZANAFLEX) 4 MG tablet Take 4 mg by mouth every 6 (six) hours as needed.  2  . triamcinolone ointment (KENALOG) 0.1 % Apply 1 application topically 2 (two) times daily as needed. 30 g 0  . VOLTAREN 1 % GEL USE AS DIRECTED FOUR TIMES A DAY TRANSDERMAL  2  . baclofen (LIORESAL) 10 MG tablet 10 mg 2 (two) times daily. Reported on 11/01/2015    . oxyCODONE-acetaminophen (PERCOCET/ROXICET) 5-325 MG per tablet Take 1 tablet by mouth every 6 (six) hours as needed. Reported on 11/01/2015  0   No current facility-administered medications for this visit.       Objective:    BP 105/75 mmHg  Pulse 76  Temp(Src) 98.3 F (36.8 C) (Oral)  Ht '5\' 2"'  (1.575 m)  Wt 225 lb 6.4 oz (102.241 kg)  BMI 41.22 kg/m2  Wt Readings from Last 3 Encounters:  11/01/15 225 lb 6.4 oz (102.241 kg)  06/18/15 225 lb (102.059 kg)  02/19/15 238 lb (107.956 kg)     Gen: NAD, alert, cooperative with exam, NCAT EYES: EOMI, no scleral injection or icterus ENT:  TMs pearly gray b/l, OP without erythema LYMPH: no cervical LAD CV: NRRR, normal S1/S2, no murmur, distal pulses 2+ b/l Resp: CTABL, no wheezes, normal WOB Abd:  +BS, soft, mildly tender lower abdomen. ND. no guarding or organomegaly Ext: No edema, warm Neuro: Alert and oriented, strength equal b/l UE and LE, coordination grossly normal MSK: normal muscle bulk     Assessment & Plan:    Latorsha was seen today for new patient (initial visit), abdominal pain and arthritis.  Diagnoses and all orders for this visit:  Essential hypertension -     CMP14+EGFR  Psoriasis -     Ambulatory referral to Rheumatology -  methotrexate (RHEUMATREX) 2.5 MG tablet; Take 6 tablets (15 mg total) by mouth once a week. -     Ambulatory referral to Dermatology -     DG HANDS 1 VIEW BILAT BALLCATCHERS; Future  Type 2 diabetes mellitus without complication, without long-term current use of insulin (HCC) -     POCT glycosylated hemoglobin (Hb A1C)  Pelvic pain in female  Dysuria  Allergic rhinitis, unspecified allergic rhinitis type -     cetirizine (ZYRTEC) 10 MG tablet; Take 1 tablet (10 mg total) by mouth daily.  Chronic obstructive pulmonary disease, unspecified COPD type (HCC) -     albuterol (PROVENTIL HFA;VENTOLIN HFA) 108 (90 Base) MCG/ACT inhaler; Inhale 2 puffs into the lungs every 4 (four) hours as needed for wheezing or shortness of breath.  Gastroesophageal reflux disease, esophagitis presence not specified -     ranitidine (ZANTAC) 300 MG tablet; Take 1 tablet (300 mg total) by mouth 2 (two) times daily.  Pain, hand joint, unspecified laterality -     CBC with Differential  Left upper quadrant pain -     POCT urinalysis dipstick -     POCT UA - Microscopic Only -     DG HANDS 1 VIEW BILAT BALLCATCHERS; Future     Follow up plan: Return in about 3 weeks (around 11/22/2015).  [name], MD [facility] 11/01/2015, 1:08 PM

## 2015-11-02 ENCOUNTER — Telehealth: Payer: Self-pay | Admitting: Pediatrics

## 2015-11-02 LAB — CMP14+EGFR
A/G RATIO: 1.8 (ref 1.1–2.5)
ALT: 19 IU/L (ref 0–32)
AST: 18 IU/L (ref 0–40)
Albumin: 4.2 g/dL (ref 3.5–5.5)
Alkaline Phosphatase: 107 IU/L (ref 39–117)
BUN/Creatinine Ratio: 5 — ABNORMAL LOW (ref 9–23)
BUN: 4 mg/dL — ABNORMAL LOW (ref 6–24)
Bilirubin Total: 0.5 mg/dL (ref 0.0–1.2)
CALCIUM: 9.4 mg/dL (ref 8.7–10.2)
CO2: 28 mmol/L (ref 18–29)
Chloride: 96 mmol/L (ref 96–106)
Creatinine, Ser: 0.86 mg/dL (ref 0.57–1.00)
GFR calc Af Amer: 96 mL/min/{1.73_m2} (ref >59)
GFR, EST NON AFRICAN AMERICAN: 83 mL/min/{1.73_m2} (ref >59)
GLOBULIN, TOTAL: 2.4 g/dL (ref 1.5–4.5)
Glucose: 85 mg/dL (ref 65–99)
POTASSIUM: 4.9 mmol/L (ref 3.5–5.2)
Sodium: 136 mmol/L (ref 134–144)
Total Protein: 6.6 g/dL (ref 6.0–8.5)

## 2015-11-02 LAB — CBC WITH DIFFERENTIAL/PLATELET
BASOS ABS: 0 10*3/uL (ref 0.0–0.2)
Basos: 0 %
EOS (ABSOLUTE): 0.3 10*3/uL (ref 0.0–0.4)
Eos: 4 %
HEMATOCRIT: 45.2 % (ref 34.0–46.6)
Hemoglobin: 15.4 g/dL (ref 11.1–15.9)
IMMATURE GRANULOCYTES: 0 %
Immature Grans (Abs): 0 10*3/uL (ref 0.0–0.1)
LYMPHS ABS: 2.5 10*3/uL (ref 0.7–3.1)
Lymphs: 34 %
MCH: 31.4 pg (ref 26.6–33.0)
MCHC: 34.1 g/dL (ref 31.5–35.7)
MCV: 92 fL (ref 79–97)
MONOS ABS: 0.4 10*3/uL (ref 0.1–0.9)
Monocytes: 5 %
NEUTROS PCT: 57 %
Neutrophils Absolute: 4.2 10*3/uL (ref 1.4–7.0)
Platelets: 263 10*3/uL (ref 150–379)
RBC: 4.91 x10E6/uL (ref 3.77–5.28)
RDW: 15.4 % (ref 12.3–15.4)
WBC: 7.4 10*3/uL (ref 3.4–10.8)

## 2015-11-02 NOTE — Telephone Encounter (Signed)
Stp and she states last night she noticed there was a little "blood clot" with her BM about the size of her finger tip. Advised pt you were out of the office until tomorrow and we don't have her results back yet. Pt voiced understanding.

## 2015-11-03 NOTE — Telephone Encounter (Signed)
Called this morning, left a message. Called this afternoon, no VM able, not able to reach pt. If she calls back, let me know best phone number to reach her.

## 2015-11-22 ENCOUNTER — Ambulatory Visit (INDEPENDENT_AMBULATORY_CARE_PROVIDER_SITE_OTHER): Payer: Medicare Other | Admitting: Pediatrics

## 2015-11-22 ENCOUNTER — Encounter: Payer: Self-pay | Admitting: Pediatrics

## 2015-11-22 VITALS — BP 132/85 | HR 77 | Temp 98.3°F | Ht 62.0 in | Wt 222.2 lb

## 2015-11-22 DIAGNOSIS — E119 Type 2 diabetes mellitus without complications: Secondary | ICD-10-CM | POA: Diagnosis not present

## 2015-11-22 DIAGNOSIS — G5603 Carpal tunnel syndrome, bilateral upper limbs: Secondary | ICD-10-CM | POA: Diagnosis not present

## 2015-11-22 DIAGNOSIS — Z6841 Body Mass Index (BMI) 40.0 and over, adult: Secondary | ICD-10-CM | POA: Diagnosis not present

## 2015-11-22 DIAGNOSIS — Z720 Tobacco use: Secondary | ICD-10-CM

## 2015-11-22 DIAGNOSIS — K219 Gastro-esophageal reflux disease without esophagitis: Secondary | ICD-10-CM

## 2015-11-22 MED ORDER — OMEPRAZOLE 20 MG PO CPDR: 20.0000 mg | DELAYED_RELEASE_CAPSULE | Freq: Every day | ORAL | Status: DC
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
## 2015-11-25 DIAGNOSIS — L400 Psoriasis vulgaris: Secondary | ICD-10-CM | POA: Diagnosis not present

## 2015-11-25 DIAGNOSIS — Z79899 Other long term (current) drug therapy: Secondary | ICD-10-CM | POA: Diagnosis not present

## 2015-11-30 DIAGNOSIS — L409 Psoriasis, unspecified: Secondary | ICD-10-CM | POA: Diagnosis not present

## 2015-11-30 DIAGNOSIS — M797 Fibromyalgia: Secondary | ICD-10-CM | POA: Diagnosis not present

## 2015-11-30 DIAGNOSIS — M2550 Pain in unspecified joint: Secondary | ICD-10-CM | POA: Diagnosis not present

## 2015-11-30 DIAGNOSIS — R5382 Chronic fatigue, unspecified: Secondary | ICD-10-CM | POA: Diagnosis not present

## 2015-11-30 DIAGNOSIS — M3500 Sicca syndrome, unspecified: Secondary | ICD-10-CM | POA: Diagnosis not present

## 2015-12-06 DIAGNOSIS — M47816 Spondylosis without myelopathy or radiculopathy, lumbar region: Secondary | ICD-10-CM | POA: Diagnosis not present

## 2015-12-06 DIAGNOSIS — M797 Fibromyalgia: Secondary | ICD-10-CM | POA: Diagnosis not present

## 2015-12-06 DIAGNOSIS — M461 Sacroiliitis, not elsewhere classified: Secondary | ICD-10-CM | POA: Diagnosis not present

## 2015-12-06 DIAGNOSIS — M9901 Segmental and somatic dysfunction of cervical region: Secondary | ICD-10-CM | POA: Diagnosis not present

## 2015-12-08 DIAGNOSIS — H43393 Other vitreous opacities, bilateral: Secondary | ICD-10-CM | POA: Diagnosis not present

## 2015-12-08 DIAGNOSIS — H04123 Dry eye syndrome of bilateral lacrimal glands: Secondary | ICD-10-CM | POA: Diagnosis not present

## 2015-12-24 ENCOUNTER — Encounter: Payer: Self-pay | Admitting: Nurse Practitioner

## 2015-12-24 ENCOUNTER — Ambulatory Visit (INDEPENDENT_AMBULATORY_CARE_PROVIDER_SITE_OTHER): Payer: Medicare Other

## 2015-12-24 ENCOUNTER — Ambulatory Visit (INDEPENDENT_AMBULATORY_CARE_PROVIDER_SITE_OTHER): Payer: Medicare Other | Admitting: Nurse Practitioner

## 2015-12-24 VITALS — BP 125/79 | HR 75 | Temp 97.8°F | Ht 62.0 in | Wt 225.0 lb

## 2015-12-24 DIAGNOSIS — S93401A Sprain of unspecified ligament of right ankle, initial encounter: Secondary | ICD-10-CM | POA: Diagnosis not present

## 2015-12-24 DIAGNOSIS — M25571 Pain in right ankle and joints of right foot: Secondary | ICD-10-CM

## 2015-12-24 NOTE — Progress Notes (Signed)
   Subjective:    Patient ID: Judy Evans, female    DOB: [birth year], 45 y.o.   MRN: [id number]  Pt in today with c/o right foot and ankle pain. States she was walking at home about 2 weeks ago and her foot went through a weak spot in the floor of the kitchen.  Has tried taking dilaudid that she had already but it didn't help much. She has also tried heat, ice and elevation without much help.  Foot Injury  The incident occurred more than 1 week ago. The incident occurred at home. The injury mechanism was a compression. The pain is present in the right foot and right ankle. The quality of the pain is described as stabbing and aching. The pain is at a severity of 8/10. The pain is moderate. The pain has been fluctuating since onset. Associated symptoms include an inability to bear weight. The symptoms are aggravated by weight bearing. She has tried ice, heat, elevation and rest for the symptoms. The treatment provided mild relief.     Review of Systems  Constitutional: Positive for activity change.  HENT: Negative.   Respiratory: Negative.   Cardiovascular: Negative.   Endocrine: Negative.   Musculoskeletal: Positive for joint swelling.       Has had some swelling but none present today. Can not wear a low sock because the pressure hurts  Skin:       Has psoriasis on both legs and feet  Neurological: Positive for weakness.  Hematological: Negative.   Psychiatric/Behavioral: Negative.        Objective:   Physical Exam  Constitutional: She is oriented to person, place, and time. Vital signs are normal. She appears well-developed and well-nourished.  HENT:  Right Ear: External ear normal.  Left Ear: External ear normal.  Nose: Nose normal.  Mouth/Throat: Oropharynx is clear and moist.  Eyes: Conjunctivae are normal.  Neck: Normal range of motion.  Cardiovascular: Normal rate, regular rhythm, normal heart sounds and intact distal pulses.   Pulmonary/Chest: Effort normal and breath  sounds normal.  Abdominal: Soft. Bowel sounds are normal.  Musculoskeletal: She exhibits tenderness.  Tenderness in right ankle. Minor pain with ROM  Neurological: She is alert and oriented to person, place, and time.  Skin: Skin is warm and dry. Rash noted.  Psychiatric: She has a normal mood and affect. Her behavior is normal. Judgment and thought content normal.    BP 125/79 mmHg  Pulse 75  Temp(Src) 97.8 F (36.6 C) (Oral)  Ht 5\' 2"  (1.575 m)  Wt 225 lb (102.059 kg)  BMI 41.14 kg/m2  Right ankle xray- no fracture-Preliminary reading by Ronnald Collum, FNP  [facility]      Assessment & Plan:   1. Pain in joint, ankle and foot, right   2. Right ankle sprain, initial encounter    Rest Ice  Compression ankle wrap Elevate when siting Motrin or tylenol OTC RTO prn  Mary-Margaret Hassell Done, FNP

## 2015-12-24 NOTE — Patient Instructions (Signed)
Ankle Sprain  An ankle sprain is an injury to the strong, fibrous tissues (ligaments) that hold the bones of your ankle joint together.   CAUSES  An ankle sprain is usually caused by a fall or by twisting your ankle. Ankle sprains most commonly occur when you step on the outer edge of your foot, and your ankle turns inward. People who participate in sports are more prone to these types of injuries.   SYMPTOMS    Pain in your ankle. The pain may be present at rest or only when you are trying to stand or walk.   Swelling.   Bruising. Bruising may develop immediately or within 1 to 2 days after your injury.   Difficulty standing or walking, particularly when turning corners or changing directions.  DIAGNOSIS   Your caregiver will ask you details about your injury and perform a physical exam of your ankle to determine if you have an ankle sprain. During the physical exam, your caregiver will press on and apply pressure to specific areas of your foot and ankle. Your caregiver will try to move your ankle in certain ways. An X-ray exam may be done to be sure a bone was not broken or a ligament did not separate from one of the bones in your ankle (avulsion fracture).   TREATMENT   Certain types of braces can help stabilize your ankle. Your caregiver can make a recommendation for this. Your caregiver may recommend the use of medicine for pain. If your sprain is severe, your caregiver may refer you to a surgeon who helps to restore function to parts of your skeletal system (orthopedist) or a physical therapist.  HOME CARE INSTRUCTIONS    Apply ice to your injury for 1-2 days or as directed by your caregiver. Applying ice helps to reduce inflammation and pain.    Put ice in a plastic bag.    Place a towel between your skin and the bag.    Leave the ice on for 15-20 minutes at a time, every 2 hours while you are awake.   Only take over-the-counter or prescription medicines for pain, discomfort, or fever as directed by  your caregiver.   Elevate your injured ankle above the level of your heart as much as possible for 2-3 days.   If your caregiver recommends crutches, use them as instructed. Gradually put weight on the affected ankle. Continue to use crutches or a cane until you can walk without feeling pain in your ankle.   If you have a plaster splint, wear the splint as directed by your caregiver. Do not rest it on anything harder than a pillow for the first 24 hours. Do not put weight on it. Do not get it wet. You may take it off to take a shower or bath.   You may have been given an elastic bandage to wear around your ankle to provide support. If the elastic bandage is too tight (you have numbness or tingling in your foot or your foot becomes cold and blue), adjust the bandage to make it comfortable.   If you have an air splint, you may blow more air into it or let air out to make it more comfortable. You may take your splint off at night and before taking a shower or bath. Wiggle your toes in the splint several times per day to decrease swelling.  SEEK MEDICAL CARE IF:    You have rapidly increasing bruising or swelling.   Your toes feel   extremely cold or you lose feeling in your foot.   Your pain is not relieved with medicine.  SEEK IMMEDIATE MEDICAL CARE IF:   Your toes are numb or blue.   You have severe pain that is increasing.  MAKE SURE YOU:    Understand these instructions.   Will watch your condition.   Will get help right away if you are not doing well or get worse.     This information is not intended to replace advice given to you by your health care provider. Make sure you discuss any questions you have with your health care provider.     Document Released: 09/11/2005 Document Revised: 10/02/2014 Document Reviewed: 09/23/2011  Elsevier Interactive Patient Education 2016 Elsevier Inc.

## 2015-12-30 DIAGNOSIS — L400 Psoriasis vulgaris: Secondary | ICD-10-CM | POA: Diagnosis not present

## 2016-01-19 ENCOUNTER — Telehealth: Payer: Self-pay

## 2016-01-19 DIAGNOSIS — S060X0A Concussion without loss of consciousness, initial encounter: Secondary | ICD-10-CM | POA: Diagnosis not present

## 2016-01-19 DIAGNOSIS — W0110XA Fall on same level from slipping, tripping and stumbling with subsequent striking against unspecified object, initial encounter: Secondary | ICD-10-CM | POA: Diagnosis not present

## 2016-01-19 DIAGNOSIS — E7800 Pure hypercholesterolemia, unspecified: Secondary | ICD-10-CM | POA: Diagnosis not present

## 2016-01-19 DIAGNOSIS — G5601 Carpal tunnel syndrome, right upper limb: Secondary | ICD-10-CM | POA: Diagnosis not present

## 2016-01-19 DIAGNOSIS — S0990XA Unspecified injury of head, initial encounter: Secondary | ICD-10-CM | POA: Diagnosis not present

## 2016-01-19 DIAGNOSIS — R51 Headache: Secondary | ICD-10-CM | POA: Diagnosis not present

## 2016-01-19 DIAGNOSIS — J449 Chronic obstructive pulmonary disease, unspecified: Secondary | ICD-10-CM | POA: Diagnosis not present

## 2016-01-19 DIAGNOSIS — R69 Illness, unspecified: Secondary | ICD-10-CM | POA: Diagnosis not present

## 2016-01-19 DIAGNOSIS — I10 Essential (primary) hypertension: Secondary | ICD-10-CM | POA: Diagnosis not present

## 2016-01-19 DIAGNOSIS — M797 Fibromyalgia: Secondary | ICD-10-CM | POA: Diagnosis not present

## 2016-01-19 DIAGNOSIS — G894 Chronic pain syndrome: Secondary | ICD-10-CM | POA: Diagnosis not present

## 2016-01-19 DIAGNOSIS — W208XXA Other cause of strike by thrown, projected or falling object, initial encounter: Secondary | ICD-10-CM | POA: Diagnosis not present

## 2016-01-19 NOTE — Telephone Encounter (Signed)
Patient aware to go to er

## 2016-01-19 NOTE — Telephone Encounter (Signed)
I recommend E.D. They can get CT results much faster than we can. Thanks, WS
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
## 2016-01-25 ENCOUNTER — Other Ambulatory Visit: Payer: Self-pay | Admitting: Pediatrics

## 2016-01-27 ENCOUNTER — Other Ambulatory Visit: Payer: Self-pay

## 2016-01-27 DIAGNOSIS — K219 Gastro-esophageal reflux disease without esophagitis: Secondary | ICD-10-CM

## 2016-01-27 MED ORDER — OMEPRAZOLE 20 MG PO CPDR: 20.0000 mg | DELAYED_RELEASE_CAPSULE | Freq: Every day | ORAL | Status: DC

## 2016-02-07 ENCOUNTER — Telehealth: Payer: Self-pay | Admitting: Pediatrics

## 2016-02-08 DIAGNOSIS — K219 Gastro-esophageal reflux disease without esophagitis: Secondary | ICD-10-CM | POA: Diagnosis not present

## 2016-02-08 DIAGNOSIS — I10 Essential (primary) hypertension: Secondary | ICD-10-CM | POA: Diagnosis not present

## 2016-02-08 DIAGNOSIS — M797 Fibromyalgia: Secondary | ICD-10-CM | POA: Diagnosis not present

## 2016-02-08 DIAGNOSIS — M79642 Pain in left hand: Secondary | ICD-10-CM | POA: Diagnosis not present

## 2016-02-08 DIAGNOSIS — E785 Hyperlipidemia, unspecified: Secondary | ICD-10-CM | POA: Diagnosis not present

## 2016-02-08 DIAGNOSIS — E1140 Type 2 diabetes mellitus with diabetic neuropathy, unspecified: Secondary | ICD-10-CM | POA: Diagnosis not present

## 2016-02-08 DIAGNOSIS — G4459 Other complicated headache syndrome: Secondary | ICD-10-CM | POA: Diagnosis not present

## 2016-02-08 DIAGNOSIS — E668 Other obesity: Secondary | ICD-10-CM | POA: Diagnosis not present

## 2016-02-08 DIAGNOSIS — M79641 Pain in right hand: Secondary | ICD-10-CM | POA: Diagnosis not present

## 2016-02-08 DIAGNOSIS — R208 Other disturbances of skin sensation: Secondary | ICD-10-CM | POA: Diagnosis not present

## 2016-02-24 DIAGNOSIS — G5603 Carpal tunnel syndrome, bilateral upper limbs: Secondary | ICD-10-CM | POA: Diagnosis not present

## 2016-02-24 DIAGNOSIS — M5412 Radiculopathy, cervical region: Secondary | ICD-10-CM | POA: Diagnosis not present

## 2016-03-02 DIAGNOSIS — G5601 Carpal tunnel syndrome, right upper limb: Secondary | ICD-10-CM | POA: Diagnosis not present

## 2016-03-10 DIAGNOSIS — Z90710 Acquired absence of both cervix and uterus: Secondary | ICD-10-CM | POA: Diagnosis not present

## 2016-03-10 DIAGNOSIS — J45909 Unspecified asthma, uncomplicated: Secondary | ICD-10-CM | POA: Diagnosis not present

## 2016-03-10 DIAGNOSIS — F17210 Nicotine dependence, cigarettes, uncomplicated: Secondary | ICD-10-CM | POA: Diagnosis not present

## 2016-03-10 DIAGNOSIS — G5601 Carpal tunnel syndrome, right upper limb: Secondary | ICD-10-CM | POA: Diagnosis not present

## 2016-03-10 DIAGNOSIS — E119 Type 2 diabetes mellitus without complications: Secondary | ICD-10-CM | POA: Diagnosis not present

## 2016-03-10 DIAGNOSIS — Z7951 Long term (current) use of inhaled steroids: Secondary | ICD-10-CM | POA: Diagnosis not present

## 2016-03-10 DIAGNOSIS — R69 Illness, unspecified: Secondary | ICD-10-CM | POA: Diagnosis not present

## 2016-03-10 DIAGNOSIS — Z79899 Other long term (current) drug therapy: Secondary | ICD-10-CM | POA: Diagnosis not present

## 2016-03-10 DIAGNOSIS — J449 Chronic obstructive pulmonary disease, unspecified: Secondary | ICD-10-CM | POA: Diagnosis not present

## 2016-03-10 DIAGNOSIS — Z9049 Acquired absence of other specified parts of digestive tract: Secondary | ICD-10-CM | POA: Diagnosis not present

## 2016-03-10 DIAGNOSIS — E7800 Pure hypercholesterolemia, unspecified: Secondary | ICD-10-CM | POA: Diagnosis not present

## 2016-03-10 DIAGNOSIS — I10 Essential (primary) hypertension: Secondary | ICD-10-CM | POA: Diagnosis not present

## 2016-03-15 ENCOUNTER — Ambulatory Visit (INDEPENDENT_AMBULATORY_CARE_PROVIDER_SITE_OTHER): Payer: Medicare HMO | Admitting: Physician Assistant

## 2016-03-15 ENCOUNTER — Encounter: Payer: Self-pay | Admitting: Physician Assistant

## 2016-03-15 VITALS — BP 113/67 | HR 72 | Temp 98.1°F | Ht 62.0 in | Wt 221.0 lb

## 2016-03-15 DIAGNOSIS — H9203 Otalgia, bilateral: Secondary | ICD-10-CM | POA: Diagnosis not present

## 2016-03-15 DIAGNOSIS — J309 Allergic rhinitis, unspecified: Secondary | ICD-10-CM | POA: Diagnosis not present

## 2016-03-15 DIAGNOSIS — L409 Psoriasis, unspecified: Secondary | ICD-10-CM

## 2016-03-15 MED ORDER — FLUTICASONE PROPIONATE 50 MCG/ACT NA SUSP: 2.0000 | Freq: Every day | NASAL | Status: DC

## 2016-03-15 NOTE — Progress Notes (Signed)
Subjective:     Patient ID: Judy Evans, female   DOB: 06/07/1971, 45 y.o.   MRN: [id number]  HPI Pt here with multiple complaints #1- Sig problems with eyes watering and intermit chronic ear pain Pt takes Zyrtec daily and has tried [employer] helped some but ins will not cover #2- Her [facility] no longer in her network so she needs referral to a new on for psoriasis  Review of Systems  Constitutional: Negative.   HENT: Positive for congestion, ear pain, postnasal drip, rhinorrhea and sinus pressure. Negative for ear discharge, sneezing, sore throat, trouble swallowing and voice change.   Eyes: Positive for discharge and itching. Negative for pain and redness.  Respiratory: Negative.   Cardiovascular: Negative.        Objective:   Physical Exam  Constitutional: She appears well-developed and well-nourished.  HENT:  Right Ear: External ear normal.  Left Ear: External ear normal.  Mouth/Throat: Oropharynx is clear and moist. No oropharyngeal exudate.  Eyes: Conjunctivae are normal. Pupils are equal, round, and reactive to light. Right eye exhibits no discharge. Left eye exhibits no discharge.  Neck: Neck supple.  Cardiovascular: Normal rate, regular rhythm and normal heart sounds.   No murmur heard. Lymphadenopathy:    She has no cervical adenopathy.  Nursing note and vitals reviewed.      Assessment:     1. Allergic rhinitis, unspecified allergic rhinitis type   2. Ear pain, bilateral   3. Psoriasis        Plan:     Trial of Flonase for sx- ill see if we can get ins to cover but may have to purchase on her own OTC Visine A Continue Zyrtec Due to chronic pain referral to ENT Pt also refer to Derm for psoriasis since hers is no longer in network

## 2016-03-15 NOTE — Patient Instructions (Signed)
Allergic Rhinitis Allergic rhinitis is when the mucous membranes in the nose respond to allergens. Allergens are particles in the air that cause your body to have an allergic reaction. This causes you to release allergic antibodies. Through a chain of events, these eventually cause you to release histamine into the blood stream. Although meant to protect the body, it is this release of histamine that causes your discomfort, such as frequent sneezing, congestion, and an itchy, runny nose.  CAUSES Seasonal allergic rhinitis (hay fever) is caused by pollen allergens that may come from grasses, trees, and weeds. Year-round allergic rhinitis (perennial allergic rhinitis) is caused by allergens such as house dust mites, pet dander, and mold spores. SYMPTOMS  Nasal stuffiness (congestion).  Itchy, runny nose with sneezing and tearing of the eyes. DIAGNOSIS Your health care provider can help you determine the allergen or allergens that trigger your symptoms. If you and your health care provider are unable to determine the allergen, skin or blood testing may be used. Your health care provider will diagnose your condition after taking your health history and performing a physical exam. Your health care provider may assess you for other related conditions, such as asthma, pink eye, or an ear infection. TREATMENT Allergic rhinitis does not have a cure, but it can be controlled by:  Medicines that block allergy symptoms. These may include allergy shots, nasal sprays, and oral antihistamines.  Avoiding the allergen. Hay fever may often be treated with antihistamines in pill or nasal spray forms. Antihistamines block the effects of histamine. There are over-the-counter medicines that may help with nasal congestion and swelling around the eyes. Check with your health care provider before taking or giving this medicine. If avoiding the allergen or the medicine prescribed do not work, there are many new medicines  your health care provider can prescribe. Stronger medicine may be used if initial measures are ineffective. Desensitizing injections can be used if medicine and avoidance does not work. Desensitization is when a patient is given ongoing shots until the body becomes less sensitive to the allergen. Make sure you follow up with your health care provider if problems continue. HOME CARE INSTRUCTIONS It is not possible to completely avoid allergens, but you can reduce your symptoms by taking steps to limit your exposure to them. It helps to know exactly what you are allergic to so that you can avoid your specific triggers. SEEK MEDICAL CARE IF:  You have a fever.  You develop a cough that does not stop easily (persistent).  You have shortness of breath.  You start wheezing.  Symptoms interfere with normal daily activities.   This information is not intended to replace advice given to you by your health care provider. Make sure you discuss any questions you have with your health care provider.   Document Released: 06/06/2001 Document Revised: 10/02/2014 Document Reviewed: 05/19/2013 Elsevier Interactive Patient Education 2016 Elsevier Inc.  

## 2016-03-23 ENCOUNTER — Encounter: Payer: Self-pay | Admitting: Family Medicine

## 2016-03-23 ENCOUNTER — Telehealth: Payer: Self-pay | Admitting: Pediatrics

## 2016-03-23 ENCOUNTER — Ambulatory Visit (INDEPENDENT_AMBULATORY_CARE_PROVIDER_SITE_OTHER): Payer: Medicare HMO | Admitting: Family Medicine

## 2016-03-23 VITALS — BP 112/73 | HR 97 | Temp 99.1°F | Ht 62.0 in | Wt 218.8 lb

## 2016-03-23 DIAGNOSIS — L409 Psoriasis, unspecified: Secondary | ICD-10-CM | POA: Diagnosis not present

## 2016-03-23 MED ORDER — METHYLPREDNISOLONE ACETATE 80 MG/ML IJ SUSP
80.0000 mg | Freq: Once | INTRAMUSCULAR | Status: AC
  Administered 2016-03-23: 80 mg via INTRAMUSCULAR

## 2016-03-23 MED ORDER — TRIAMCINOLONE ACETONIDE 0.1 % EX OINT: 1.0000 "application " | TOPICAL_OINTMENT | Freq: Two times a day (BID) | CUTANEOUS | Status: DC | PRN

## 2016-03-23 NOTE — Progress Notes (Signed)
BP 112/73 mmHg  Pulse 97  Temp(Src) 99.1 F (37.3 C) (Oral)  Ht 5\' 2"  (1.575 m)  Wt 218 lb 12.8 oz (99.247 kg)  BMI 40.01 kg/m2   Subjective:    Patient ID: Judy Evans, female    DOB: 1971/09/09, 45 y.o.   MRN: [id number]  HPI: Judy Evans is a 45 y.o. female presenting on 03/23/2016 for Psoriasis   HPI  Worsening psoriasis and itching Patient has been having worsening itching and spreading of her psoriasis. She has been using triamcinolone cream which is been helping much is running out of that. She prior used to be on hold has low but had too many side effects from it and could not continue it. She is in the works to try and get to a dermatologist but is having some issues with her insurance. She is coming in today because she needs something more than the cream that she is using to help her. She denies any drainage or redness or warmth from any spots. She does have psoriasis spread on the upper extremities her back ears on the outside and inside of the canals. She also has some on her inner thighs and lower legs. She is even started getting a few spots in front of her scalp.  Relevant past medical, surgical, family and social history reviewed and updated as indicated. Interim medical history since our last visit reviewed. Allergies and medications reviewed and updated.  Review of Systems  Constitutional: Negative for fever and chills.  HENT: Negative for congestion, ear discharge and ear pain.   Eyes: Negative for redness and visual disturbance.  Respiratory: Negative for chest tightness and shortness of breath.   Cardiovascular: Negative for chest pain and leg swelling.  Genitourinary: Negative for dysuria and difficulty urinating.  Musculoskeletal: Negative for back pain and gait problem.  Skin: Positive for rash.  Neurological: Negative for light-headedness and headaches.  Psychiatric/Behavioral: Negative for behavioral problems and agitation.  All other  systems reviewed and are negative.   Per HPI unless specifically indicated above     Medication List       This list is accurate as of: 03/23/16  4:17 PM.  Always use your most recent med list.               acetaminophen 500 MG tablet  Commonly known as:  TYLENOL  Take 500 mg by mouth every 6 (six) hours as needed.     albuterol 108 (90 Base) MCG/ACT inhaler  Commonly known as:  PROVENTIL HFA;VENTOLIN HFA  Inhale 2 puffs into the lungs every 4 (four) hours as needed for wheezing or shortness of breath.     calcipotriene 0.005 % cream  Commonly known as:  DOVONOX  APPLY ON THE SKIN TWICE A DAY     cetirizine 10 MG tablet  Commonly known as:  ZYRTEC  TAKE 1 TABLET (10 MG TOTAL) BY MOUTH DAILY.     Fish Oil 1000 MG Caps  Take 1 capsule by mouth 3 (three) times daily.     fluticasone 50 MCG/ACT nasal spray  Commonly known as:  FLONASE  Place 2 sprays into both nostrils daily.     fluticasone 50 MCG/ACT nasal spray  Commonly known as:  FLONASE  Place 2 sprays into both nostrils daily.     gabapentin 800 MG tablet  Commonly known as:  NEURONTIN  Take 800 mg by mouth 4 (four) times daily.     halobetasol 0.05 % ointment  Commonly known as:  ULTRAVATE  APPLY ON THE SKIN TWICE A DAY     HYDROmorphone 2 MG tablet  Commonly known as:  DILAUDID  Take 2 mg by mouth every 6 (six) hours as needed.     nystatin ointment  Commonly known as:  MYCOSTATIN  Apply 1 application topically 2 (two) times daily as needed.     omeprazole 20 MG capsule  Commonly known as:  PRILOSEC  Take 1 capsule (20 mg total) by mouth daily.     SUMAtriptan 100 MG tablet  Commonly known as:  IMITREX  Reported on 03/23/2016     tiZANidine 4 MG tablet  Commonly known as:  ZANAFLEX  Take 4 mg by mouth every 6 (six) hours as needed.     triamcinolone ointment 0.1 %  Commonly known as:  KENALOG  Apply 1 application topically 2 (two) times daily as needed.           Objective:    BP  112/73 mmHg  Pulse 97  Temp(Src) 99.1 F (37.3 C) (Oral)  Ht 5\' 2"  (1.575 m)  Wt 218 lb 12.8 oz (99.247 kg)  BMI 40.01 kg/m2  Wt Readings from Last 3 Encounters:  03/23/16 218 lb 12.8 oz (99.247 kg)  03/15/16 221 lb (100.245 kg)  12/24/15 225 lb (102.059 kg)    Physical Exam  Constitutional: She is oriented to person, place, and time. She appears well-developed and well-nourished. No distress.  Eyes: Conjunctivae and EOM are normal. Pupils are equal, round, and reactive to light.  Cardiovascular: Normal rate, regular rhythm, normal heart sounds and intact distal pulses.   No murmur heard. Pulmonary/Chest: Effort normal and breath sounds normal. No respiratory distress. She has no wheezes.  Musculoskeletal: Normal range of motion. She exhibits no edema or tenderness.  Neurological: She is alert and oriented to person, place, and time. Coordination normal.  Skin: Skin is warm and dry. Rash (Scaly plaques on both upper extremities back and chest and some on her lower extremities. Also has some scattered on the auricular parts of her ear and scaliness in her ear canal as well) noted. She is not diaphoretic.  Psychiatric: She has a normal mood and affect. Her behavior is normal.  Nursing note and vitals reviewed.     Assessment & Plan:   Problem List Items Addressed This Visit      Musculoskeletal and Integument   Psoriasis - Primary   Relevant Medications   methylPREDNISolone acetate (DEPO-MEDROL) injection 80 mg (Start on 03/23/2016  4:30 PM)   triamcinolone ointment (KENALOG) 0.1 %       Follow up plan: Return if symptoms worsen or fail to improve.  Counseling provided for all of the vaccine components No orders of the defined types were placed in this encounter.    Caryl Pina, MD [facility] 03/23/2016, 4:17 PM

## 2016-04-11 ENCOUNTER — Other Ambulatory Visit: Payer: Self-pay | Admitting: Pediatrics

## 2016-04-13 DIAGNOSIS — J310 Chronic rhinitis: Secondary | ICD-10-CM | POA: Diagnosis not present

## 2016-04-13 DIAGNOSIS — H9203 Otalgia, bilateral: Secondary | ICD-10-CM | POA: Diagnosis not present

## 2016-04-13 DIAGNOSIS — M26621 Arthralgia of right temporomandibular joint: Secondary | ICD-10-CM | POA: Diagnosis not present

## 2016-04-20 ENCOUNTER — Telehealth: Payer: Self-pay | Admitting: Pediatrics

## 2016-04-20 ENCOUNTER — Encounter: Payer: Self-pay | Admitting: Pediatrics

## 2016-04-20 ENCOUNTER — Ambulatory Visit (INDEPENDENT_AMBULATORY_CARE_PROVIDER_SITE_OTHER): Payer: Medicare HMO | Admitting: Pediatrics

## 2016-04-20 VITALS — BP 120/73 | HR 64 | Temp 98.0°F | Ht 62.0 in | Wt 218.0 lb

## 2016-04-20 DIAGNOSIS — L409 Psoriasis, unspecified: Secondary | ICD-10-CM | POA: Diagnosis not present

## 2016-04-20 DIAGNOSIS — I10 Essential (primary) hypertension: Secondary | ICD-10-CM | POA: Diagnosis not present

## 2016-04-20 DIAGNOSIS — K219 Gastro-esophageal reflux disease without esophagitis: Secondary | ICD-10-CM | POA: Diagnosis not present

## 2016-04-20 DIAGNOSIS — J449 Chronic obstructive pulmonary disease, unspecified: Secondary | ICD-10-CM | POA: Diagnosis not present

## 2016-04-20 DIAGNOSIS — G5603 Carpal tunnel syndrome, bilateral upper limbs: Secondary | ICD-10-CM | POA: Diagnosis not present

## 2016-04-20 DIAGNOSIS — M5441 Lumbago with sciatica, right side: Secondary | ICD-10-CM | POA: Diagnosis not present

## 2016-04-20 MED ORDER — TRIAMCINOLONE ACETONIDE 0.1 % EX OINT: 1.0000 "application " | TOPICAL_OINTMENT | Freq: Two times a day (BID) | CUTANEOUS | 2 refills | Status: DC | PRN

## 2016-04-20 MED ORDER — LIDO-CAPSAICIN-MEN-METHYL SAL 0.5-0.035-5-20 % EX PTCH: 1.0000 | MEDICATED_PATCH | CUTANEOUS | 1 refills | Status: DC

## 2016-04-20 MED ORDER — DICLOFENAC SODIUM 1 % TD GEL: 4.0000 g | Freq: Four times a day (QID) | TRANSDERMAL | 2 refills | Status: DC

## 2016-04-20 MED ORDER — NYSTATIN 100000 UNIT/GM EX OINT: 1.0000 "application " | TOPICAL_OINTMENT | Freq: Two times a day (BID) | CUTANEOUS | 0 refills | Status: DC | PRN

## 2016-04-20 MED ORDER — LIDOCAINE 5 % EX PTCH: 1.0000 | MEDICATED_PATCH | CUTANEOUS | 1 refills | Status: DC

## 2016-04-20 MED ORDER — ALBUTEROL SULFATE HFA 108 (90 BASE) MCG/ACT IN AERS: INHALATION_SPRAY | RESPIRATORY_TRACT | 5 refills | Status: DC

## 2016-04-20 MED ORDER — BUDESONIDE-FORMOTEROL FUMARATE 80-4.5 MCG/ACT IN AERO: 2.0000 | INHALATION_SPRAY | Freq: Two times a day (BID) | RESPIRATORY_TRACT | 3 refills | Status: DC

## 2016-04-20 NOTE — Patient Instructions (Addendum)
Lidocaine patch prescription written Anacream and aspercream

## 2016-04-20 NOTE — Telephone Encounter (Signed)
[facility] said this patch for #30 was about $1000. They have Lidoderm patches. Can this be Rx'd instead
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
## 2016-04-26 ENCOUNTER — Telehealth: Payer: Self-pay | Admitting: Pediatrics

## 2016-05-01 NOTE — Telephone Encounter (Signed)
We cannot release other provider records.

## 2016-05-02 DIAGNOSIS — G5601 Carpal tunnel syndrome, right upper limb: Secondary | ICD-10-CM | POA: Diagnosis not present

## 2016-05-02 DIAGNOSIS — M25631 Stiffness of right wrist, not elsewhere classified: Secondary | ICD-10-CM | POA: Diagnosis not present

## 2016-05-02 DIAGNOSIS — M25531 Pain in right wrist: Secondary | ICD-10-CM | POA: Diagnosis not present

## 2016-05-02 DIAGNOSIS — M62541 Muscle wasting and atrophy, not elsewhere classified, right hand: Secondary | ICD-10-CM | POA: Diagnosis not present

## 2016-05-03 DIAGNOSIS — M25631 Stiffness of right wrist, not elsewhere classified: Secondary | ICD-10-CM | POA: Diagnosis not present

## 2016-05-03 DIAGNOSIS — G5601 Carpal tunnel syndrome, right upper limb: Secondary | ICD-10-CM | POA: Diagnosis not present

## 2016-05-03 DIAGNOSIS — M62541 Muscle wasting and atrophy, not elsewhere classified, right hand: Secondary | ICD-10-CM | POA: Diagnosis not present

## 2016-05-03 DIAGNOSIS — M25531 Pain in right wrist: Secondary | ICD-10-CM | POA: Diagnosis not present

## 2016-05-18 ENCOUNTER — Ambulatory Visit (INDEPENDENT_AMBULATORY_CARE_PROVIDER_SITE_OTHER): Payer: Medicare HMO | Admitting: Pediatrics

## 2016-05-18 VITALS — BP 131/85 | HR 72 | Temp 97.3°F | Ht 62.0 in | Wt 216.8 lb

## 2016-05-18 DIAGNOSIS — Z720 Tobacco use: Secondary | ICD-10-CM

## 2016-05-18 DIAGNOSIS — L409 Psoriasis, unspecified: Secondary | ICD-10-CM | POA: Diagnosis not present

## 2016-05-18 DIAGNOSIS — I10 Essential (primary) hypertension: Secondary | ICD-10-CM | POA: Diagnosis not present

## 2016-05-18 DIAGNOSIS — L400 Psoriasis vulgaris: Secondary | ICD-10-CM | POA: Diagnosis not present

## 2016-05-18 DIAGNOSIS — J449 Chronic obstructive pulmonary disease, unspecified: Secondary | ICD-10-CM | POA: Diagnosis not present

## 2016-05-18 DIAGNOSIS — Z6841 Body Mass Index (BMI) 40.0 and over, adult: Secondary | ICD-10-CM

## 2016-05-18 DIAGNOSIS — R350 Frequency of micturition: Secondary | ICD-10-CM | POA: Diagnosis not present

## 2016-05-18 DIAGNOSIS — G5603 Carpal tunnel syndrome, bilateral upper limbs: Secondary | ICD-10-CM

## 2016-05-18 DIAGNOSIS — Z860100 Personal history of colon polyps, unspecified: Secondary | ICD-10-CM

## 2016-05-18 LAB — URINALYSIS, COMPLETE
Bilirubin, UA: NEGATIVE
Glucose, UA: NEGATIVE
Ketones, UA: NEGATIVE
LEUKOCYTES UA: NEGATIVE
Nitrite, UA: NEGATIVE
PH UA: 6 (ref 5.0–7.5)
PROTEIN UA: NEGATIVE
RBC, UA: NEGATIVE
Specific Gravity, UA: 1.01 (ref 1.005–1.030)
UUROB: 0.2 mg/dL (ref 0.2–1.0)

## 2016-05-18 LAB — MICROSCOPIC EXAMINATION
BACTERIA UA: NONE SEEN
RBC, UA: NONE SEEN /hpf (ref 0–?)

## 2016-05-18 NOTE — Patient Instructions (Signed)
Schedule nurse visit for pulmonary function tests  I put in referral to the GI doctor

## 2016-05-18 NOTE — Progress Notes (Signed)
  Subjective:   Patient ID: Judy Evans, female    DOB: 07/09/1971, 45 y.o.   MRN: [id number] CC: 4 week rck (on back pain and COPD); Urinary Frequency; and Abdominal Pain (lower )  HPI: Judy Evans is a 45 y.o. female presenting for 4 week rck (on back pain and COPD); Urinary Frequency; and Abdominal Pain (lower )  Has been having off and on lower UTI symptoms for past weeks Feels a lot of pressure Urinary urgency, has bad pain  No burning Has had UTIs in the past at times Also treated yeast infections Saw gynecology and had pelvic ultrasound done one year ago S/p hyseterectomy Irregular bowel movements sometimes several days between stools, hard  [place] [facility] did colonoscopies, h/o polyps, says that she was told to get yearly colonoscopies  Has appt this afternoon with derm for psoriasis Getting worse  Back pain ongoing Following up with Dr. Lyla Son in a few weeks  Tobacco, 1ppd, not able to quit now, "too much going on"  Relevant past medical, surgical, family and social history reviewed. Allergies and medications reviewed and updated. History  Smoking Status  . Current Every Day Smoker  . Packs/day: 1.50  . Years: 30.00  . Types: Cigarettes  Smokeless Tobacco  . Never Used   ROS: Per HPI   Objective:    BP 131/85 (BP Location: Left Arm, Patient Position: Sitting, Cuff Size: Large)   Pulse 72   Temp 97.3 F (36.3 C) (Oral)   Ht 5\' 2"  (1.575 m)   Wt 216 lb 12.8 oz (98.3 kg)   BMI 39.65 kg/m   Wt Readings from Last 3 Encounters:  05/18/16 216 lb 12.8 oz (98.3 kg)  04/20/16 218 lb (98.9 kg)  03/23/16 218 lb 12.8 oz (99.2 kg)    Gen: NAD, alert, cooperative with exam, NCAT EYES: EOMI, no conjunctival injection, or no icterus ENT:  TMs pearly gray b/l, OP without erythema LYMPH: no cervical LAD CV: NRRR, normal S1/S2, no murmur, distal pulses 2+ b/l Resp: CTABL, no wheezes, normal WOB Abd: +BS, soft, NTND. no guarding or  organomegaly Ext: No edema, warm Neuro: Alert and oriented Skin: psoriasis over all extremities  Assessment & Plan:  Lorraina was seen today for 4 week rck, urinary frequency and abdominal pain.  Diagnoses and all orders for this visit:  Frequent urination UA normal Normal pelvic US last year S/p hysterectomy Will work on constipation May need referral to urology in future if sx not improving -     Urinalysis, Complete -     Urine culture  History of colonic polyps -     Ambulatory referral to Gastroenterology  Tobacco abuse Ongoing, not interested in cessation now  BMI 40.0-44.9, adult ([place]) Cont lifestyle changes, decrease sugary beverages  Essential hypertension  adequtae contro Not on medications  Bilateral carpal tunnel syndrome S/p recent surgery Still with paain in R wrist Has follow up soon  Chronic obstructive pulmonary disease, unspecified COPD type ([place]) Coughing worse with symbicort Will get PFTs next visit, pt cant stay today  Psoriasis F/u derm today as sched  Other orders -     Microscopic Examination   Follow up plan: No Follow-up on file. [name], MD [place]

## 2016-05-20 LAB — URINE CULTURE

## 2016-05-22 DIAGNOSIS — M25631 Stiffness of right wrist, not elsewhere classified: Secondary | ICD-10-CM | POA: Diagnosis not present

## 2016-05-22 DIAGNOSIS — M62541 Muscle wasting and atrophy, not elsewhere classified, right hand: Secondary | ICD-10-CM | POA: Diagnosis not present

## 2016-05-22 DIAGNOSIS — M25531 Pain in right wrist: Secondary | ICD-10-CM | POA: Diagnosis not present

## 2016-05-22 DIAGNOSIS — G5601 Carpal tunnel syndrome, right upper limb: Secondary | ICD-10-CM | POA: Diagnosis not present

## 2016-05-23 ENCOUNTER — Other Ambulatory Visit: Payer: Self-pay | Admitting: Pediatrics

## 2016-05-23 DIAGNOSIS — N3000 Acute cystitis without hematuria: Secondary | ICD-10-CM

## 2016-05-23 MED ORDER — NITROFURANTOIN MONOHYD MACRO 100 MG PO CAPS: 100.0000 mg | ORAL_CAPSULE | Freq: Two times a day (BID) | ORAL | 0 refills | Status: AC

## 2016-05-24 DIAGNOSIS — G5601 Carpal tunnel syndrome, right upper limb: Secondary | ICD-10-CM | POA: Diagnosis not present

## 2016-05-24 DIAGNOSIS — M62541 Muscle wasting and atrophy, not elsewhere classified, right hand: Secondary | ICD-10-CM | POA: Diagnosis not present

## 2016-05-24 DIAGNOSIS — M25631 Stiffness of right wrist, not elsewhere classified: Secondary | ICD-10-CM | POA: Diagnosis not present

## 2016-05-24 DIAGNOSIS — M25531 Pain in right wrist: Secondary | ICD-10-CM | POA: Diagnosis not present

## 2016-05-26 ENCOUNTER — Ambulatory Visit (INDEPENDENT_AMBULATORY_CARE_PROVIDER_SITE_OTHER): Payer: Medicare HMO | Admitting: *Deleted

## 2016-05-26 DIAGNOSIS — J449 Chronic obstructive pulmonary disease, unspecified: Secondary | ICD-10-CM | POA: Diagnosis not present

## 2016-05-26 NOTE — Progress Notes (Signed)
Pt here today for PFT per Dr.Vincent. PFT completed and pt advised Dr.Vincent will call her once she has reviewed the report.

## 2016-05-29 ENCOUNTER — Other Ambulatory Visit: Payer: Self-pay | Admitting: Nurse Practitioner

## 2016-05-29 DIAGNOSIS — K219 Gastro-esophageal reflux disease without esophagitis: Secondary | ICD-10-CM

## 2016-05-30 ENCOUNTER — Encounter: Payer: Self-pay | Admitting: Internal Medicine

## 2016-05-31 DIAGNOSIS — G5601 Carpal tunnel syndrome, right upper limb: Secondary | ICD-10-CM | POA: Diagnosis not present

## 2016-05-31 DIAGNOSIS — M62541 Muscle wasting and atrophy, not elsewhere classified, right hand: Secondary | ICD-10-CM | POA: Diagnosis not present

## 2016-05-31 DIAGNOSIS — M25531 Pain in right wrist: Secondary | ICD-10-CM | POA: Diagnosis not present

## 2016-05-31 DIAGNOSIS — Z23 Encounter for immunization: Secondary | ICD-10-CM | POA: Diagnosis not present

## 2016-05-31 DIAGNOSIS — M25631 Stiffness of right wrist, not elsewhere classified: Secondary | ICD-10-CM | POA: Diagnosis not present

## 2016-06-01 DIAGNOSIS — M62541 Muscle wasting and atrophy, not elsewhere classified, right hand: Secondary | ICD-10-CM | POA: Diagnosis not present

## 2016-06-01 DIAGNOSIS — M25531 Pain in right wrist: Secondary | ICD-10-CM | POA: Diagnosis not present

## 2016-06-01 DIAGNOSIS — M25631 Stiffness of right wrist, not elsewhere classified: Secondary | ICD-10-CM | POA: Diagnosis not present

## 2016-06-01 DIAGNOSIS — G5601 Carpal tunnel syndrome, right upper limb: Secondary | ICD-10-CM | POA: Diagnosis not present

## 2016-06-05 DIAGNOSIS — M25531 Pain in right wrist: Secondary | ICD-10-CM | POA: Diagnosis not present

## 2016-06-05 DIAGNOSIS — M62541 Muscle wasting and atrophy, not elsewhere classified, right hand: Secondary | ICD-10-CM | POA: Diagnosis not present

## 2016-06-05 DIAGNOSIS — G5601 Carpal tunnel syndrome, right upper limb: Secondary | ICD-10-CM | POA: Diagnosis not present

## 2016-06-05 DIAGNOSIS — M25631 Stiffness of right wrist, not elsewhere classified: Secondary | ICD-10-CM | POA: Diagnosis not present

## 2016-06-12 DIAGNOSIS — M47812 Spondylosis without myelopathy or radiculopathy, cervical region: Secondary | ICD-10-CM | POA: Diagnosis not present

## 2016-06-12 DIAGNOSIS — M170 Bilateral primary osteoarthritis of knee: Secondary | ICD-10-CM | POA: Diagnosis not present

## 2016-06-12 DIAGNOSIS — M5136 Other intervertebral disc degeneration, lumbar region: Secondary | ICD-10-CM | POA: Diagnosis not present

## 2016-06-12 DIAGNOSIS — Z79891 Long term (current) use of opiate analgesic: Secondary | ICD-10-CM | POA: Diagnosis not present

## 2016-06-12 DIAGNOSIS — M47817 Spondylosis without myelopathy or radiculopathy, lumbosacral region: Secondary | ICD-10-CM | POA: Diagnosis not present

## 2016-06-12 DIAGNOSIS — M545 Low back pain: Secondary | ICD-10-CM | POA: Diagnosis not present

## 2016-06-12 DIAGNOSIS — G894 Chronic pain syndrome: Secondary | ICD-10-CM | POA: Diagnosis not present

## 2016-06-12 DIAGNOSIS — M2550 Pain in unspecified joint: Secondary | ICD-10-CM | POA: Diagnosis not present

## 2016-06-12 DIAGNOSIS — M797 Fibromyalgia: Secondary | ICD-10-CM | POA: Diagnosis not present

## 2016-06-13 ENCOUNTER — Telehealth: Payer: Self-pay | Admitting: Pediatrics

## 2016-06-13 NOTE — Telephone Encounter (Signed)
Patient called stating that she received a flu shot 2 weeks ago from pharmacy and has been having arm pain since. Appt. Made for tomorrow 09/20 at 10:25

## 2016-06-14 ENCOUNTER — Encounter: Payer: Self-pay | Admitting: Family Medicine

## 2016-06-14 ENCOUNTER — Ambulatory Visit (INDEPENDENT_AMBULATORY_CARE_PROVIDER_SITE_OTHER): Payer: Medicare HMO | Admitting: Family Medicine

## 2016-06-14 VITALS — BP 121/67 | HR 65 | Temp 98.0°F | Ht 62.0 in | Wt 218.1 lb

## 2016-06-14 DIAGNOSIS — IMO0001 Reserved for inherently not codable concepts without codable children: Secondary | ICD-10-CM

## 2016-06-14 DIAGNOSIS — M609 Myositis, unspecified: Secondary | ICD-10-CM

## 2016-06-14 DIAGNOSIS — M791 Myalgia: Secondary | ICD-10-CM

## 2016-06-14 MED ORDER — PREDNISONE 10 MG PO TABS: ORAL_TABLET | ORAL | 0 refills | Status: DC

## 2016-06-14 NOTE — Progress Notes (Signed)
Subjective:  Patient ID: Judy Evans, female    DOB: 01-15-1971  Age: 45 y.o. MRN: [id number]  CC: Arm Pain (pt here today c/o left arm/shoulder pain x 2 weeks since [facility])   HPI Judy Evans presents for dull pain, feels like something sticking in arm and twisting. Pain similar to rotator cuff tear in past. Radiating to anterior shoulder and down to elbow. Has hx of fibromyalgia.    History Judy Evans has a past medical history of Aphasia; Arthritis; COPD (chronic obstructive pulmonary disease) ([place]); Diabetes mellitus without complication ([place]); Fibromyalgia; H. pylori infection; Hyperlipidemia; Hypertension; LLQ pain (06/18/2015); Migraines; [place] (01/22/2015); Pain with urination (02/19/2015); Psoriasis; and Scoliosis.   She has a past surgical history that includes Cardiac surgery; Abdominal hysterectomy; Cesarean section; Tonsillectomy; Cholecystectomy; rotator cuff surgery; and Foot surgery (Bilateral).   Her family history includes Arthritis in her maternal grandmother; Asthma in her daughter and son; CAD in her sister; COPD in her mother; Cancer in her maternal grandmother, paternal grandfather, and paternal grandmother; Dementia in her maternal grandmother; Diabetes in her maternal grandmother, mother, and sister; Early death in her brother; Factor V Leiden deficiency in her sister; Healthy in her father; Lupus in her sister; Osteoarthritis in her mother; Other in her maternal grandfather.She reports that she has been smoking Cigarettes.  She has a 45.00 pack-year smoking history. She has never used smokeless tobacco. She reports that she does not drink alcohol or use drugs.    ROS Review of Systems  Constitutional: Negative for activity change, appetite change and fever.  HENT: Negative for congestion, rhinorrhea and sore throat.   Eyes: Negative for visual disturbance.  Respiratory: Negative for cough and shortness of breath.   Cardiovascular:  Negative for chest pain and palpitations.  Gastrointestinal: Negative for abdominal pain, diarrhea and nausea.  Genitourinary: Negative for dysuria.  Musculoskeletal: Positive for arthralgias and myalgias.    Objective:  BP 121/67   Pulse 65   Temp 98 F (36.7 C) (Oral)   Ht 5\' 2"  (1.575 m)   Wt 218 lb 2 oz (98.9 kg)   BMI 39.90 kg/m   BP Readings from Last 3 Encounters:  06/14/16 121/67  05/18/16 131/85  04/20/16 120/73    Wt Readings from Last 3 Encounters:  06/14/16 218 lb 2 oz (98.9 kg)  05/18/16 216 lb 12.8 oz (98.3 kg)  04/20/16 218 lb (98.9 kg)     Physical Exam  Constitutional: She appears well-developed and well-nourished.  HENT:  Head: Normocephalic.  Cardiovascular: Normal rate and regular rhythm.   No murmur heard. Pulmonary/Chest: Effort normal and breath sounds normal.  Musculoskeletal: She exhibits tenderness (at the lateral aspect of the biceps, mid upper arm. No palpable mass/lesion).     Lab Results  Component Value Date   WBC 7.4 11/01/2015   HCT 45.2 11/01/2015   PLT 263 11/01/2015   GLUCOSE 85 11/01/2015   ALT 19 11/01/2015   AST 18 11/01/2015   NA 136 11/01/2015   K 4.9 11/01/2015   CL 96 11/01/2015   CREATININE 0.86 11/01/2015   BUN 4 (L) 11/01/2015   CO2 28 11/01/2015   HGBA1C 5.6 11/01/2015    No results found.  Assessment & Plan:   Judy Evans was seen today for arm pain.  Diagnoses and all orders for this visit:  Myalgia and myositis  Other orders -     predniSONE (DELTASONE) 10 MG tablet; Take 5 daily for 3 days followed  by 4,3,2 and 1 for 3 days each.      I am having Ms. Fanelli start on predniSONE. I am also having her maintain her acetaminophen, gabapentin, fluticasone, calcipotriene, HYDROmorphone, cetirizine, diclofenac sodium, nystatin ointment, albuterol, budesonide-formoterol, lidocaine, omeprazole, and DULoxetine.  Meds ordered this encounter  Medications  . DULoxetine (CYMBALTA) 30 MG capsule  .  predniSONE (DELTASONE) 10 MG tablet    Sig: Take 5 daily for 3 days followed by 4,3,2 and 1 for 3 days each.    Dispense:  45 tablet    Refill:  0     Follow-up: Return in about 2 weeks (around 06/28/2016), or if symptoms worsen or fail to improve.  Claretta Fraise, M.D.

## 2016-06-20 ENCOUNTER — Ambulatory Visit (INDEPENDENT_AMBULATORY_CARE_PROVIDER_SITE_OTHER): Payer: Medicare HMO | Admitting: Gastroenterology

## 2016-06-20 ENCOUNTER — Encounter: Payer: Self-pay | Admitting: Gastroenterology

## 2016-06-20 VITALS — BP 134/75 | HR 71 | Temp 98.4°F | Ht 62.0 in | Wt 216.2 lb

## 2016-06-20 DIAGNOSIS — Z86010 Personal history of colonic polyps: Secondary | ICD-10-CM

## 2016-06-20 DIAGNOSIS — K589 Irritable bowel syndrome without diarrhea: Secondary | ICD-10-CM | POA: Diagnosis not present

## 2016-06-20 DIAGNOSIS — G8929 Other chronic pain: Secondary | ICD-10-CM

## 2016-06-20 DIAGNOSIS — R1032 Left lower quadrant pain: Secondary | ICD-10-CM | POA: Diagnosis not present

## 2016-06-20 DIAGNOSIS — K219 Gastro-esophageal reflux disease without esophagitis: Secondary | ICD-10-CM | POA: Diagnosis not present

## 2016-06-20 DIAGNOSIS — R1013 Epigastric pain: Secondary | ICD-10-CM

## 2016-06-20 MED ORDER — DICYCLOMINE HCL 10 MG PO CAPS: ORAL_CAPSULE | ORAL | 1 refills | Status: DC

## 2016-06-20 NOTE — Patient Instructions (Signed)
1. I will review your records once available. 2. Please have your labs and stool studies done. 3. Start Bentyl 1-2 capsules before meals and at bedtime as needed for abdominal cramping and diarrhea. Hold for constipation. No more than 8 per day.

## 2016-06-20 NOTE — Assessment & Plan Note (Signed)
Patient reports having frequent colonoscopies while living in [place] for history of colon polyps. We have requested records for review. Last colonoscopy 2015, normal as outlined above in past surgical history.

## 2016-06-20 NOTE — Progress Notes (Signed)
cc'ed to pcp °

## 2016-06-20 NOTE — Progress Notes (Signed)
Primary Care Physician:  Eustaquio Maize, MD  Primary Gastroenterologist:  Garfield Cornea, MD   Chief Complaint  Patient presents with  . Follow-up    HPI:  Judy Evans is a 45 y.o. female here for further evaluation multiple GI complaints. She is a new patient here. Referred by her PCP.  Patient tells me that she has had multiple colonoscopies in the past, at least 4. First 3 were done in [place] almost on a yearly basis for reported history of polyps. Her last colonoscopy was by Dr. Anthony Sar at [facility] in October 2015. Normal exam. She also had EGD in November 2015 by Dr. Anthony Sar which is normal. Patient tells me her maternal grandmother was diagnosed with colon cancer at age 73. Her father has had a colonoscopy, she is not aware of him having any polyps.  She presents today with multiple GI issues. Her heartburn is well-controlled on omeprazole. Occasional dysphagia to meats/bread. Postprandial abdominal pain in epigastric and LLQ region. LLQ crampy quality. Doesn't matter what eat. Most days. Will last for 20 minutes or so. Often with postprandial loose stools. Nothing seems to make the pain better. Stools are [place] 5-7. One time past (3-4 months ago) something that looked like a clot. No melena. Multiple stools per day. Multiple times in first two hours of the day and then pp. Sometimes uses imodium on rare occasions.     Current Outpatient Prescriptions  Medication Sig Dispense Refill  . albuterol (VENTOLIN HFA) 108 (90 Base) MCG/ACT inhaler INHALE 2 PUFFS INTO THE LUNGS EVERY 4 (FOUR) HOURS AS NEEDED FOR WHEEZING OR SHORTNESS OF BREATH. 18 Inhaler 5  . budesonide-formoterol (SYMBICORT) 80-4.5 MCG/ACT inhaler Inhale 2 puffs into the lungs 2 (two) times daily. 1 Inhaler 3  . cetirizine (ZYRTEC) 10 MG tablet TAKE 1 TABLET (10 MG TOTAL) BY MOUTH DAILY. 30 tablet 8  . diclofenac sodium (VOLTAREN) 1 % GEL Apply 4 g topically 4 (four) times daily. 100 g 2  . DULoxetine  (CYMBALTA) 30 MG capsule     . fluticasone (FLONASE) 50 MCG/ACT nasal spray Place 2 sprays into both nostrils daily.   0  . gabapentin (NEURONTIN) 800 MG tablet Take 800 mg by mouth 4 (four) times daily.    Marland Kitchen HYDROmorphone (DILAUDID) 2 MG tablet Take 2 mg by mouth every 6 (six) hours as needed.  0  . lidocaine (LIDODERM) 5 % Place 1 patch onto the skin daily. Remove & Discard patch within 12 hours or as directed by MD 30 patch 1  . omeprazole (PRILOSEC) 20 MG capsule TAKE 1 CAPSULE (20 MG TOTAL) BY MOUTH DAILY. 90 capsule 1  . predniSONE (DELTASONE) 10 MG tablet Take 5 daily for 3 days followed by 4,3,2 and 1 for 3 days each. 45 tablet 0  .       No current facility-administered medications for this visit.     Allergies as of 06/20/2016 - Review Complete 06/20/2016  Allergen Reaction Noted  . Rosuvastatin calcium Swelling 11/25/2014  . Statins Swelling 11/25/2014  . Hctz [hydrochlorothiazide] Nausea And Vomiting and Rash 01/12/2014  . Hydrocodone-acetaminophen Rash 04/13/2016  . Oxycodone Rash 04/13/2016  . Penicillins Nausea And Vomiting and Rash 01/12/2014    Past Medical History:  Diagnosis Date  . Aphasia   . Arthritis   . COPD (chronic obstructive pulmonary disease) ([place])   . DDD (degenerative disc disease), cervical   . DDD (degenerative disc disease), lumbar   . Diabetes mellitus without complication ([place])   .  Fibromyalgia   . GERD (gastroesophageal reflux disease)   . H. pylori infection   . Hyperlipidemia   . Hypertension   . IBS (irritable bowel syndrome)   . LLQ pain 06/18/2015  . Migraines   . Moody 01/22/2015  . Pain with urination 02/19/2015  . Psoriasis   . Scoliosis     Past Surgical History:  Procedure Laterality Date  . ABDOMINAL HYSTERECTOMY    . CARDIAC CATHETERIZATION  2015   patient reported it to be normal. "I was dying in pain during procedure".   . CARPAL TUNNEL RELEASE    . CESAREAN SECTION    . CHOLECYSTECTOMY    . COLONOSCOPY  06/2014    Burke Keels: normal  . ESOPHAGOGASTRODUODENOSCOPY  07/2014   Burke Keels: Normal  . FOOT SURGERY Bilateral   . rotator cuff surgery    . TONSILLECTOMY      Family History  Problem Relation Age of Onset  . COPD Mother   . Diabetes Mother   . Osteoarthritis Mother   . Healthy Father   . Lupus Sister   . CAD Sister   . Diabetes Sister   . Early death Brother     pneumonia  . Asthma Daughter   . Asthma Son   . Cancer Maternal Grandmother 81    colon  . Arthritis Maternal Grandmother   . Diabetes Maternal Grandmother   . Dementia Maternal Grandmother   . Other Maternal Grandfather     brain tumor  . Cancer Paternal Grandmother     breast  . Cancer Paternal Grandfather     lung  . Factor V Leiden deficiency Sister     Social History   Social History  . Marital status: Married    Spouse name: N/A  . Number of children: N/A  . Years of education: N/A   Occupational History  . Not on file.   Social History Main Topics  . Smoking status: Current Every Day Smoker    Packs/day: 1.50    Years: 30.00    Types: Cigarettes  . Smokeless tobacco: Never Used  . Alcohol use No  . Drug use: No  . Sexual activity: Not Currently    Birth control/ protection: Surgical     Comment: hyst   Other Topics Concern  . Not on file   Social History Narrative  . No narrative on file      ROS:  General: Negative for anorexia, weight loss, fever, chills, fatigue, weakness. Eyes: Negative for vision changes.  ENT: Negative for hoarseness, difficulty swallowing , nasal congestion. CV: Negative for chest pain, angina, palpitations, dyspnea on exertion, peripheral edema.  Respiratory: Negative for dyspnea at rest, dyspnea on exertion, cough, sputum, wheezing.  GI: See history of present illness. GU:  Negative for dysuria, hematuria, urinary incontinence, urinary frequency, nocturnal urination.  MS: Negative for joint pain, low back pain.  Derm: Negative for rash or itching.   Neuro: Negative for weakness, abnormal sensation, seizure, frequent headaches, memory loss, confusion.  Psych: Negative for anxiety, depression, suicidal ideation, hallucinations.  Endo: Negative for unusual weight change.  Heme: Negative for bruising or bleeding. Allergy: Negative for rash or hives.    Physical Examination:  BP 134/75   Pulse 71   Temp 98.4 F (36.9 C) (Oral)   Ht 5\' 2"  (1.575 m)   Wt 216 lb 3.2 oz (98.1 kg)   BMI 39.54 kg/m    General: Well-nourished, well-developed in no acute distress.  Head: Normocephalic,  atraumatic.   Eyes: Conjunctiva pink, no icterus. Mouth: Oropharyngeal mucosa moist and pink , no lesions erythema or exudate. Neck: Supple without thyromegaly, masses, or lymphadenopathy.  Lungs: Clear to auscultation bilaterally.  Heart: Regular rate and rhythm, no murmurs rubs or gallops.  Abdomen: Bowel sounds are normal, mild epigastric/left lower quadrant tenderness, nondistended, no hepatosplenomegaly or masses, no abdominal bruits or    hernia , no rebound or guarding.  Some tenderness with palpation of left ilial crest Rectal: Not performed Extremities: No lower extremity edema. No clubbing or deformities.  Neuro: Alert and oriented x 4 , grossly normal neurologically.  Skin: Warm and dry, no jaundice.  Diffuse psoriasis especially on the extremities Psych: Alert and cooperative, normal mood and affect.  Labs: Lab Results  Component Value Date   WBC 7.4 11/01/2015   HCT 45.2 11/01/2015   MCV 92 11/01/2015   PLT 263 11/01/2015   Lab Results  Component Value Date   CREATININE 0.86 11/01/2015   BUN 4 (L) 11/01/2015   NA 136 11/01/2015   K 4.9 11/01/2015   CL 96 11/01/2015   CO2 28 11/01/2015   Lab Results  Component Value Date   ALT 19 11/01/2015   AST 18 11/01/2015   ALKPHOS 107 11/01/2015   BILITOT 0.5 11/01/2015     Imaging Studies: No results found.

## 2016-06-20 NOTE — Assessment & Plan Note (Signed)
Patient complains of chronic postprandial epigastric and left lower quadrant pain associated with postprandial loose stools, no weight loss, no recent blood in the stool. Obtain baseline labs, check thyroid function as well. Rule out celiac disease. Check I FOBT and a GI pathogen panel. Symptoms may be related to IBS/dyspepsia but other etiologies need to be excluded. Discussed at length with patient today, it is not clear that she will need additional colonoscopies at this time but I need to review her previous colonoscopy reports to determine why she was getting frequent colonoscopies. Further recommendations to follow.

## 2016-06-20 NOTE — Assessment & Plan Note (Signed)
Chronic GERD, well-controlled on PPI therapy. EGD in 2015 reported to be normal. Recommend antireflux measures. Continue current regimen.

## 2016-06-23 NOTE — Progress Notes (Signed)
Received colonoscopy report dated 04/20/2008. [facility] gastroenterology. Indication, personal history of colon polyps. Diverticulosis of the sigmoid colon found. No polyps seen. Repeat colonoscopy in 5 years based on personal history of colon polyps. Received Demerol 100 and Versed 9, Benadryl 50mg   Patient on this finding. Await labs, stools studies. Currently patient is on track for colonoscopy for surveillance of polyps, next TCS would be due 2020. Could change in symptoms are unexplained.    PLEASE NIC FOR TCS IN 09/2018, H/O COLON POLYPS.

## 2016-06-27 ENCOUNTER — Ambulatory Visit (INDEPENDENT_AMBULATORY_CARE_PROVIDER_SITE_OTHER): Payer: Medicare HMO

## 2016-06-27 DIAGNOSIS — R1013 Epigastric pain: Secondary | ICD-10-CM

## 2016-06-27 DIAGNOSIS — K589 Irritable bowel syndrome without diarrhea: Secondary | ICD-10-CM | POA: Diagnosis not present

## 2016-06-27 LAB — IFOBT (OCCULT BLOOD): IFOBT: NEGATIVE

## 2016-07-03 NOTE — Progress Notes (Signed)
Please let patient know her ifobt was negative.  She still needs to complete labs. If she is having diarrhea, she still needs to complete gi path panel.

## 2016-07-04 NOTE — Progress Notes (Signed)
PT is aware. She had taken stool that was not watery and had to wait and do another specimen. She has collected and will take over and do the blood work also.

## 2016-07-07 DIAGNOSIS — R1032 Left lower quadrant pain: Secondary | ICD-10-CM | POA: Diagnosis not present

## 2016-07-07 DIAGNOSIS — G8929 Other chronic pain: Secondary | ICD-10-CM | POA: Diagnosis not present

## 2016-07-07 DIAGNOSIS — Z86010 Personal history of colonic polyps: Secondary | ICD-10-CM | POA: Diagnosis not present

## 2016-07-07 DIAGNOSIS — R1013 Epigastric pain: Secondary | ICD-10-CM | POA: Diagnosis not present

## 2016-07-07 DIAGNOSIS — K589 Irritable bowel syndrome without diarrhea: Secondary | ICD-10-CM | POA: Diagnosis not present

## 2016-07-07 DIAGNOSIS — K219 Gastro-esophageal reflux disease without esophagitis: Secondary | ICD-10-CM | POA: Diagnosis not present

## 2016-07-07 NOTE — Progress Notes (Signed)
On recall  °

## 2016-07-08 LAB — COMPREHENSIVE METABOLIC PANEL
ALT: 12 U/L (ref 6–29)
AST: 13 U/L (ref 10–30)
Albumin: 4.1 g/dL (ref 3.6–5.1)
Alkaline Phosphatase: 83 U/L (ref 33–115)
BUN: 5 mg/dL — AB (ref 7–25)
CHLORIDE: 104 mmol/L (ref 98–110)
CO2: 25 mmol/L (ref 20–31)
CREATININE: 0.76 mg/dL (ref 0.50–1.10)
Calcium: 9 mg/dL (ref 8.6–10.2)
GLUCOSE: 98 mg/dL (ref 65–99)
POTASSIUM: 3.9 mmol/L (ref 3.5–5.3)
SODIUM: 139 mmol/L (ref 135–146)
TOTAL PROTEIN: 6.3 g/dL (ref 6.1–8.1)
Total Bilirubin: 0.5 mg/dL (ref 0.2–1.2)

## 2016-07-08 LAB — LIPASE: LIPASE: 32 U/L (ref 7–60)

## 2016-07-08 LAB — CBC WITH DIFFERENTIAL/PLATELET
Basophils Absolute: 0 cells/uL (ref 0–200)
Basophils Relative: 0 %
EOS PCT: 3 %
Eosinophils Absolute: 195 cells/uL (ref 15–500)
HCT: 46.9 % — ABNORMAL HIGH (ref 35.0–45.0)
HEMOGLOBIN: 15.8 g/dL — AB (ref 11.7–15.5)
LYMPHS ABS: 2080 {cells}/uL (ref 850–3900)
Lymphocytes Relative: 32 %
MCH: 30.6 pg (ref 27.0–33.0)
MCHC: 33.7 g/dL (ref 32.0–36.0)
MCV: 90.7 fL (ref 80.0–100.0)
MONOS PCT: 4 %
MPV: 10.6 fL (ref 7.5–12.5)
Monocytes Absolute: 260 cells/uL (ref 200–950)
NEUTROS ABS: 3965 {cells}/uL (ref 1500–7800)
Neutrophils Relative %: 61 %
PLATELETS: 235 10*3/uL (ref 140–400)
RBC: 5.17 MIL/uL — AB (ref 3.80–5.10)
RDW: 13.9 % (ref 11.0–15.0)
WBC: 6.5 10*3/uL (ref 3.8–10.8)

## 2016-07-08 LAB — TSH: TSH: 0.94 mIU/L

## 2016-07-10 ENCOUNTER — Other Ambulatory Visit: Payer: Self-pay | Admitting: Pediatrics

## 2016-07-10 DIAGNOSIS — M47817 Spondylosis without myelopathy or radiculopathy, lumbosacral region: Secondary | ICD-10-CM | POA: Diagnosis not present

## 2016-07-10 DIAGNOSIS — M5441 Lumbago with sciatica, right side: Secondary | ICD-10-CM

## 2016-07-10 DIAGNOSIS — M47812 Spondylosis without myelopathy or radiculopathy, cervical region: Secondary | ICD-10-CM | POA: Diagnosis not present

## 2016-07-10 DIAGNOSIS — G894 Chronic pain syndrome: Secondary | ICD-10-CM | POA: Diagnosis not present

## 2016-07-10 DIAGNOSIS — Z79891 Long term (current) use of opiate analgesic: Secondary | ICD-10-CM | POA: Diagnosis not present

## 2016-07-10 LAB — TISSUE TRANSGLUTAMINASE, IGA: TISSUE TRANSGLUTAMINASE AB, IGA: 1 U/mL (ref <4)

## 2016-07-10 LAB — IGA: IgA: 171 mg/dL (ref 81–463)

## 2016-07-10 LAB — GASTROINTESTINAL PATHOGEN PANEL PCR
C. difficile Tox A/B, PCR: NOT DETECTED
Campylobacter, PCR: NOT DETECTED
Cryptosporidium, PCR: NOT DETECTED
E COLI (ETEC) LT/ST, PCR: NOT DETECTED
E COLI (STEC) STX1/STX2, PCR: NOT DETECTED
E COLI 0157, PCR: NOT DETECTED
GIARDIA LAMBLIA, PCR: NOT DETECTED
NOROVIRUS, PCR: NOT DETECTED
Rotavirus A, PCR: NOT DETECTED
SALMONELLA, PCR: NOT DETECTED
SHIGELLA, PCR: NOT DETECTED

## 2016-07-12 ENCOUNTER — Ambulatory Visit (INDEPENDENT_AMBULATORY_CARE_PROVIDER_SITE_OTHER): Payer: Medicare HMO | Admitting: Pediatrics

## 2016-07-12 ENCOUNTER — Encounter: Payer: Self-pay | Admitting: Pediatrics

## 2016-07-12 ENCOUNTER — Ambulatory Visit (INDEPENDENT_AMBULATORY_CARE_PROVIDER_SITE_OTHER): Payer: Medicare HMO

## 2016-07-12 VITALS — BP 134/80 | HR 70 | Temp 98.3°F | Ht 62.0 in | Wt 218.8 lb

## 2016-07-12 DIAGNOSIS — M25512 Pain in left shoulder: Secondary | ICD-10-CM

## 2016-07-12 NOTE — Progress Notes (Signed)
  Subjective:   Patient ID: Judy Evans, female    DOB: Mar 27, 1971, 45 y.o.   MRN: [id number] CC: Arm Pain (left)  HPI: Judy Evans is a 45 y.o. female presenting for Arm Pain (left)  Coughing a lot, dry hacking cough Worse when she lays down Felt like she was having some congestion recently  L Arm: still throbbing coming down neck into upper L arm voltaren gel helping slightly Does not think she has increased her activity Hurts to do use L shoulder  Relevant past medical, surgical, family and social history reviewed. Allergies and medications reviewed and updated. History  Smoking Status  . Current Every Day Smoker  . Packs/day: 1.50  . Years: 30.00  . Types: Cigarettes  Smokeless Tobacco  . Never Used   ROS: Per HPI   Objective:    BP 134/80   Pulse 70   Temp 98.3 F (36.8 C) (Oral)   Ht 5\' 2"  (1.575 m)   Wt 218 lb 12.8 oz (99.2 kg)   BMI 40.02 kg/m   Wt Readings from Last 3 Encounters:  07/12/16 218 lb 12.8 oz (99.2 kg)  06/20/16 216 lb 3.2 oz (98.1 kg)  06/14/16 218 lb 2 oz (98.9 kg)    Gen: NAD, alert, cooperative with exam, NCAT EYES: EOMI, no conjunctival injection, or no icterus ENT:  TMs pearly gray b/l, OP without erythema LYMPH: no cervical LAD CV: NRRR, normal S1/S2, no murmur, distal pulses 2+ b/l Resp: CTABL, no wheezes, normal WOB Neuro: Alert and oriented MSK: Shoulder: Inspection reveals no abnormalities, atrophy tenderness over bicipital groove, soft tissue posterior shoulder TTP Pain with Internal, ext rotation L shoulder against resistance  Can raise arms b/l overhead   Assessment & Plan:  [name] was seen today for arm pain.  Diagnoses and all orders for this visit:  Left shoulder pain, unspecified chronicity -     DG Shoulder Left; Future -     Ambulatory referral to Orthopedic Surgery   Follow up plan: As needed [name], MD [place]

## 2016-07-12 NOTE — Progress Notes (Signed)
Please let patient know her thyroid function was normal. Renal and liver labs normal. No anemia. No celiac disease. GI path panel negative and previously her ifobt was negative.  I would recommend trial of bentyl 10mg  qac and qhs prn abd cramps/pain and loose stools. #120 1refill. OV in 4-6 weeks with RMR only.

## 2016-07-13 DIAGNOSIS — M7582 Other shoulder lesions, left shoulder: Secondary | ICD-10-CM | POA: Diagnosis not present

## 2016-07-13 DIAGNOSIS — M25512 Pain in left shoulder: Secondary | ICD-10-CM | POA: Diagnosis not present

## 2016-07-14 DIAGNOSIS — M50222 Other cervical disc displacement at C5-C6 level: Secondary | ICD-10-CM | POA: Diagnosis not present

## 2016-07-14 DIAGNOSIS — M50221 Other cervical disc displacement at C4-C5 level: Secondary | ICD-10-CM | POA: Diagnosis not present

## 2016-07-14 DIAGNOSIS — M4802 Spinal stenosis, cervical region: Secondary | ICD-10-CM | POA: Diagnosis not present

## 2016-07-14 DIAGNOSIS — M47812 Spondylosis without myelopathy or radiculopathy, cervical region: Secondary | ICD-10-CM | POA: Diagnosis not present

## 2016-07-17 ENCOUNTER — Encounter: Payer: Self-pay | Admitting: Internal Medicine

## 2016-07-21 ENCOUNTER — Ambulatory Visit (INDEPENDENT_AMBULATORY_CARE_PROVIDER_SITE_OTHER): Payer: Medicare HMO | Admitting: Pediatrics

## 2016-07-21 ENCOUNTER — Encounter: Payer: Self-pay | Admitting: Pediatrics

## 2016-07-21 VITALS — BP 121/79 | HR 72 | Temp 98.0°F | Ht 62.0 in | Wt 213.0 lb

## 2016-07-21 DIAGNOSIS — R232 Flushing: Secondary | ICD-10-CM

## 2016-07-21 DIAGNOSIS — H65113 Acute and subacute allergic otitis media (mucoid) (sanguinous) (serous), bilateral: Secondary | ICD-10-CM | POA: Diagnosis not present

## 2016-07-21 DIAGNOSIS — Z720 Tobacco use: Secondary | ICD-10-CM

## 2016-07-21 MED ORDER — CLONIDINE HCL 0.1 MG/24HR TD PTWK: 0.1000 mg | MEDICATED_PATCH | TRANSDERMAL | 2 refills | Status: DC

## 2016-07-21 MED ORDER — AZITHROMYCIN 250 MG PO TABS: ORAL_TABLET | ORAL | 0 refills | Status: DC

## 2016-07-21 NOTE — Progress Notes (Signed)
  Subjective:   Patient ID: Judy Evans, female    DOB: 1970-12-04, 45 y.o.   MRN: [id number] CC: Follow-up (2 month)  HPI: Judy Evans is a 45 y.o. female presenting for Follow-up (2 month)  Shoulder pain: Seen by orthopedics Arm still hurts some Starting physical therapy If no improvement might get MRI  Coughing more recently Smoking more recently Ears throbbing for past week Worse yesterday into today More congestion recently  No fevers Appetite down  Menopausal symptoms: Hot flashes daily Mostly face involved Has been on gabapentin and cymbalta for other reasons Smoking daily  Relevant past medical, surgical, family and social history reviewed. Allergies and medications reviewed and updated. History  Smoking Status  . Current Every Day Smoker  . Packs/day: 1.50  . Years: 30.00  . Types: Cigarettes  Smokeless Tobacco  . Never Used   ROS: Per HPI   Objective:    BP 121/79   Pulse 72   Temp 98 F (36.7 C) (Oral)   Ht 5\' 2"  (1.575 m)   Wt 213 lb (96.6 kg)   BMI 38.96 kg/m   Wt Readings from Last 3 Encounters:  07/21/16 213 lb (96.6 kg)  07/12/16 218 lb 12.8 oz (99.2 kg)  06/20/16 216 lb 3.2 oz (98.1 kg)    Gen: NAD, alert, cooperative with exam, NCAT EYES: EOMI, no conjunctival injection, or no icterus ENT:  L TM bulging with serous fluid, slightly red, OP without erythema LYMPH: no cervical LAD CV: NRRR, normal S1/S2, no murmur, distal pulses 2+ b/l Resp: CTABL, no wheezes, normal WOB Ext: No edema, warm Neuro: Alert and oriented MSK: normal muscle bulk  Assessment & Plan:  Ethlyn was seen today for follow-up multiple med problems.  Diagnoses and all orders for this visit:  Hot flashes Already on SNRI, gabapentin Not hormone replacement candidate given tobacco use Can try clonidine patch Let me know if lightheaded -     cloNIDine (CATAPRES - DOSED IN MG/24 HR) 0.1 mg/24hr patch; Place 1 patch (0.1 mg total) onto the skin once a  week.  Acute mucoid otitis media of both ears Pain, swelling in ear -     azithromycin (ZITHROMAX) 250 MG tablet; Take 2 the first day and then one each day after.  Tobacco abuse Continue to encourage cessation Smoking more recently as stress is up  Follow up plan: Return in about 3 months (around 10/21/2016). [name], MD [place]

## 2016-07-24 ENCOUNTER — Ambulatory Visit: Payer: Medicare HMO | Attending: Physician Assistant | Admitting: Physical Therapy

## 2016-07-24 DIAGNOSIS — M25512 Pain in left shoulder: Secondary | ICD-10-CM | POA: Insufficient documentation

## 2016-07-24 DIAGNOSIS — M545 Low back pain: Secondary | ICD-10-CM | POA: Diagnosis not present

## 2016-07-24 NOTE — Therapy (Signed)
[facility] [place], [place], [address] Phone: [phone]   Fax:  [phone]  Physical Therapy Evaluation  Patient Details  Name: Judy Evans MRN: [id number] Date of Birth: 06-11-1971 Referring Provider: Gerrit Halls PA-C  Encounter Date: 07/24/2016      PT End of Session - 07/24/16 1821    Visit Number 1   Number of Visits 12   Date for PT Re-Evaluation 09/22/16   PT Start Time 0108   PT Stop Time 0159   PT Time Calculation (min) 51 min   Activity Tolerance Patient tolerated treatment well   Behavior During Therapy [facility] for tasks assessed/performed      Past Medical History:  Diagnosis Date  . Aphasia   . Arthritis   . COPD (chronic obstructive pulmonary disease) ([place])   . DDD (degenerative disc disease), cervical   . DDD (degenerative disc disease), lumbar   . Diabetes mellitus without complication ([place])   . Fibromyalgia   . GERD (gastroesophageal reflux disease)   . H. pylori infection   . Hyperlipidemia   . Hypertension   . IBS (irritable bowel syndrome)   . LLQ pain 06/18/2015  . Migraines   . Moody 01/22/2015  . Pain with urination 02/19/2015  . Psoriasis   . Scoliosis     Past Surgical History:  Procedure Laterality Date  . ABDOMINAL HYSTERECTOMY    . CARDIAC CATHETERIZATION  2015   patient reported it to be normal. "I was dying in pain during procedure".   . CARPAL TUNNEL RELEASE    . CESAREAN SECTION    . CHOLECYSTECTOMY    . COLONOSCOPY  06/2014   Burke Keels: normal  . ESOPHAGOGASTRODUODENOSCOPY  07/2014   Burke Keels: Normal  . FOOT SURGERY Bilateral   . rotator cuff surgery    . TONSILLECTOMY      There were no vitals filed for this visit.       Subjective Assessment - 07/24/16 1343    Subjective The patient reports the onset of left shoulder pain in September (2017).  She correlates this pain with receiving a flu shot in her left shoulder region.  Her current  pain-level is a 6/10 but higher (7+/10) when attempting to lie on her left shoulder.  She has found nothing really decreases her pain.     Pertinent History Fibromyalgia.   Patient Stated Goals Get out of pain.   Currently in Pain? Yes   Pain Score 6    Pain Location Shoulder   Pain Orientation Left   Pain Descriptors / Indicators Aching;Throbbing   Pain Type Acute pain   Pain Onset More than a month ago   Pain Frequency Constant   Aggravating Factors  Lying on left shoulder.   Pain Relieving Factors "Nothing."            [facility] PT Assessment - 07/24/16 0001      Assessment   Medical Diagnosis Rotator cuff tendonitis,left.   Referring Provider Gerrit Halls PA-C   Onset Date/Surgical Date --  September 2017.     Precautions   Precautions None     Restrictions   Weight Bearing Restrictions No     Balance Screen   Has the patient fallen in the past 6 months No   Has the patient had a decrease in activity level because of a fear of falling?  No   Is the patient reluctant to leave their home because of a fear of falling?  No     Home Ecologist residence     Prior Function   Level of Independence Independent     Posture/Postural Control   Posture/Postural Control Postural limitations   Postural Limitations Rounded Shoulders     ROM / Strength   AROM / PROM / Strength AROM;Strength     AROM   Overall AROM Comments Left shoulder ER limited to 72 degrees.     Strength   Overall Strength Comments left shoulder RTC and deltoid strength= 4+/5.     Palpation   Palpation comment Tender to palpation at left acromial ridge and TP over left UT with referred pain into left middle deltoid.     Special Tests    Special Tests Rotator Cuff Impingement   Rotator Cuff Impingment tests Michel Bickers test     Hawkins-Kennedy test   Findings Positive   Side Left     Ambulation/Gait   Gait Comments WNL.                   [facility]  Adult PT Treatment/Exercise - 07/24/16 0001      Modalities   Modalities Electrical Stimulation     Electrical Stimulation   Electrical Stimulation Location Left shoulder.   Electrical Stimulation Action Constant pre-mod.   Electrical Stimulation Parameters 80-150 Hz x 15 minutes.   Electrical Stimulation Goals Pain                  PT Short Term Goals - 07/24/16 1817      PT SHORT TERM GOAL #1   Title STG's=LTG's.           PT Long Term Goals - 07/24/16 1817      PT LONG TERM GOAL #1   Title Independent with a HEP.   Time 6   Period Weeks   Status New     PT LONG TERM GOAL #2   Title Increase left shoulder ER to 85 degrees.   Time 6   Period Weeks   Status New     PT LONG TERM GOAL #3   Title Sleep 6 hours undisturbed.   Time 6   Period Weeks   Status New     PT LONG TERM GOAL #4   Title Perform ADL's with pain not > 2-3/10.   Time 6   Period Weeks   Status New               Plan - 07/24/16 1808    Clinical Impression Statement The patient presents with left shoulder pain and a loss of active ER.  Pain prevents her from sleeping on her left side.  She is also experiencing referred pain into her left middle deltoid region.   Rehab Potential Excellent   PT Frequency 2x / week   PT Duration 6 weeks   PT Treatment/Interventions ADLs/Self Care Home Management;Cryotherapy;Electrical Stimulation;Moist Heat;Ultrasound;Patient/family education;Therapeutic exercise;Therapeutic activities;Passive range of motion   PT Next Visit Plan Corner stretch to increase ER; RW4; [facility] ER; Combo e'stim/U/S; e'stim.   Consulted and Agree with Plan of Care Patient      Patient will benefit from skilled therapeutic intervention in order to improve the following deficits and impairments:  Pain, Decreased activity tolerance, Decreased range of motion  Visit Diagnosis: Acute pain of left shoulder - Plan: PT plan of care cert/re-cert      G-Codes - [id number]  [id number]    Functional Assessment Tool Used Clinical judgement...Marland KitchenMarland Kitchen  Functional Limitation Carrying, moving and handling objects   Carrying, Moving and Handling Objects Current Status [phone]) At least 20 percent but less than 40 percent impaired, limited or restricted   Carrying, Moving and Handling Objects Goal Status [id number]) At least 1 percent but less than 20 percent impaired, limited or restricted       Problem List Patient Active Problem List   Diagnosis Date Noted  . History of colonic polyps 06/20/2016  . IBS (irritable bowel syndrome) 06/20/2016  . Abdominal pain, chronic, epigastric 06/20/2016  . BMI 40.0-44.9, adult ([place]) 11/22/2015  . Bilateral carpal tunnel syndrome 11/22/2015  . Tobacco abuse 11/22/2015  . Allergic rhinitis 11/01/2015  . GERD (gastroesophageal reflux disease) 11/01/2015  . LLQ pain 06/18/2015  . Bloating 06/18/2015  . Pain with urination 02/19/2015  . Moody 01/22/2015  . Pelvic pain in female 01/13/2015  . Dysuria 01/13/2015  . Pelvic pressure in female 01/13/2015  . Vaginal pain 01/13/2015  . Hot flashes due to surgical menopause 01/13/2015  . Pre-diabetes 01/13/2015  . Hypertension 01/13/2015  . Migraines 01/13/2015  . Psoriasis 01/13/2015  . Arthritis 01/13/2015  . Fibromyalgia 01/13/2015  . COPD (chronic obstructive pulmonary disease) ([place]) 01/13/2015  . Hyperlipidemia 01/13/2015    Yesly Gerety, [place] 07/24/2016, 6:23 PM  [facility] [address] [place], [place], [address] Phone: [phone]   Fax:  [phone]  Name: Judy Evans MRN: [id number] Date of Birth: [birth year]

## 2016-07-24 NOTE — Addendum Note (Signed)
Addended by: Toran Murch, [place] W on: 07/24/2016 06:24 PM   Modules accepted: Orders

## 2016-07-24 NOTE — Therapy (Addendum)
[facility] [place], [place], [address] Phone: [phone]   Fax:  [phone]  Patient Details  Name: Judy Evans MRN: [id number] Date of Birth: [birth year] Referring Provider:  Gerrit Halls, PA-C  Encounter Date: 07/24/2016  Antoine Vandermeulen, [place] MPT 07/24/2016, 2:10 PM  [facility] [address] [place], [place], [address] Phone: [phone]   Fax:  [phone]

## 2016-07-25 ENCOUNTER — Ambulatory Visit (INDEPENDENT_AMBULATORY_CARE_PROVIDER_SITE_OTHER): Payer: Medicare HMO | Admitting: Family

## 2016-07-25 ENCOUNTER — Encounter: Payer: Self-pay | Admitting: Family

## 2016-07-25 VITALS — BP 120/83 | HR 72 | Temp 97.9°F | Ht 62.0 in | Wt 215.0 lb

## 2016-07-25 DIAGNOSIS — J0100 Acute maxillary sinusitis, unspecified: Secondary | ICD-10-CM | POA: Diagnosis not present

## 2016-07-25 MED ORDER — FLUTICASONE PROPIONATE 50 MCG/ACT NA SUSP: 2.0000 | Freq: Every day | NASAL | 0 refills | Status: DC

## 2016-07-25 MED ORDER — DOXYCYCLINE HYCLATE 100 MG PO TABS: 100.0000 mg | ORAL_TABLET | Freq: Two times a day (BID) | ORAL | 0 refills | Status: DC

## 2016-07-25 NOTE — Progress Notes (Signed)
   Subjective:    Patient ID: Judy Evans, female    DOB: [birth year], 45 y.o.   MRN: [id number]  Sinus Problem  This is a new problem. The current episode started 1 to 4 weeks ago. The problem has been gradually worsening since onset. There has been no fever. Her pain is at a severity of 5/10. The pain is mild. Associated symptoms include congestion, ear pain, headaches, a hoarse voice, sinus pressure, sneezing and a sore throat. Pertinent negatives include no coughing. Past treatments include antibiotics, lying down, oral decongestants and spray decongestants. The treatment provided mild relief.      Review of Systems  HENT: Positive for congestion, ear pain, hoarse voice, sinus pressure, sneezing and sore throat.   Respiratory: Negative for cough.   Neurological: Positive for headaches.  All other systems reviewed and are negative.      Objective:   Physical Exam  Constitutional: She is oriented to person, place, and time. She appears well-developed and well-nourished. No distress.  HENT:  Head: Normocephalic and atraumatic.  Right Ear: External ear normal.  Left Ear: External ear normal.  Nose: Mucosal edema and rhinorrhea present. Right sinus exhibits maxillary sinus tenderness and frontal sinus tenderness. Left sinus exhibits maxillary sinus tenderness and frontal sinus tenderness.  Mouth/Throat: Oropharynx is clear and moist.  Eyes: Pupils are equal, round, and reactive to light.  Neck: Normal range of motion. Neck supple. No thyromegaly present.  Cardiovascular: Normal rate, regular rhythm, normal heart sounds and intact distal pulses.   No murmur heard. Pulmonary/Chest: Effort normal and breath sounds normal. No respiratory distress. She has no wheezes.  Abdominal: Soft. Bowel sounds are normal. She exhibits no distension. There is no tenderness.  Musculoskeletal: Normal range of motion. She exhibits no edema or tenderness.  Neurological: She is alert and oriented to  person, place, and time. She has normal reflexes. No cranial nerve deficit.  Skin: Skin is warm and dry.  Psychiatric: She has a normal mood and affect. Her behavior is normal. Judgment and thought content normal.  Vitals reviewed.     BP 120/83   Pulse 72   Temp 97.9 F (36.6 C) (Oral)   Ht 5\' 2"  (1.575 m)   Wt 215 lb (97.5 kg)   BMI 39.32 kg/m      Assessment & Plan:  1. Acute maxillary sinusitis, recurrence not specified -- Take meds as prescribed - Use a cool mist humidifier  -Use saline nose sprays frequently -Saline irrigations of the nose can be very helpful if done frequently.  * 4X daily for 1 week*  * Use of a nettie pot can be helpful with this. Follow directions with this* -Force fluids -For any cough or congestion  Use plain Mucinex- regular strength or max strength is fine   * Children- consult with Pharmacist for dosing -For fever or aces or pains- take tylenol or ibuprofen appropriate for age and weight.  * for fevers greater than 101 orally you may alternate ibuprofen and tylenol every  3 hours. -Throat lozenges if help - doxycycline (VIBRA-TABS) 100 MG tablet; Take 1 tablet (100 mg total) by mouth 2 (two) times daily.  Dispense: 20 tablet; Refill: 0 - fluticasone (FLONASE) 50 MCG/ACT nasal spray; Place 2 sprays into both nostrils daily.  Dispense: 16 g; Refill: 0  Evelina Dun, FNP

## 2016-07-25 NOTE — Patient Instructions (Signed)

## 2016-07-27 ENCOUNTER — Ambulatory Visit: Payer: Medicare HMO | Attending: Physician Assistant | Admitting: Physical Therapy

## 2016-07-27 ENCOUNTER — Encounter: Payer: Self-pay | Admitting: Physical Therapy

## 2016-07-27 DIAGNOSIS — M25512 Pain in left shoulder: Secondary | ICD-10-CM | POA: Diagnosis not present

## 2016-07-27 NOTE — Patient Instructions (Addendum)
Flexibility: Corner Stretch    Standing in corner with hands just above shoulder level and feet __2-4__ inches from corner, lean forward until a comfortable stretch is felt across chest. Hold __10__ seconds. Repeat __5__ times per set. Do __2__ sets per session. Do _2__ sessions per day.  http://orth.exer.us/342   Copyright  VHI. All rights reserved.

## 2016-07-27 NOTE — Therapy (Signed)
[facility] [place], [place], [address] Phone: [phone]   Fax:  [phone]  Physical Therapy Treatment  Patient Details  Name: Judy Evans MRN: [id number] Date of Birth: 1971/02/15 Referring Provider: Gerrit Halls PA-C  Encounter Date: 07/27/2016      PT End of Session - 07/27/16 1110    Visit Number 2   Number of Visits 12   Date for PT Re-Evaluation 09/22/16   PT Start Time 1031   PT Stop Time 1117   PT Time Calculation (min) 46 min   Activity Tolerance Patient tolerated treatment well   Behavior During Therapy [facility] for tasks assessed/performed      Past Medical History:  Diagnosis Date  . Aphasia   . Arthritis   . COPD (chronic obstructive pulmonary disease) ([place])   . DDD (degenerative disc disease), cervical   . DDD (degenerative disc disease), lumbar   . Diabetes mellitus without complication ([place])   . Fibromyalgia   . GERD (gastroesophageal reflux disease)   . H. pylori infection   . Hyperlipidemia   . Hypertension   . IBS (irritable bowel syndrome)   . LLQ pain 06/18/2015  . Migraines   . Moody 01/22/2015  . Pain with urination 02/19/2015  . Psoriasis   . Scoliosis     Past Surgical History:  Procedure Laterality Date  . ABDOMINAL HYSTERECTOMY    . CARDIAC CATHETERIZATION  2015   patient reported it to be normal. "I was dying in pain during procedure".   . CARPAL TUNNEL RELEASE    . CESAREAN SECTION    . CHOLECYSTECTOMY    . COLONOSCOPY  06/2014   Burke Keels: normal  . ESOPHAGOGASTRODUODENOSCOPY  07/2014   Burke Keels: Normal  . FOOT SURGERY Bilateral   . rotator cuff surgery    . TONSILLECTOMY      There were no vitals filed for this visit.      Subjective Assessment - 07/27/16 1030    Subjective Patient reported ongoing soreness   Pertinent History Fibromyalgia.   Patient Stated Goals Get out of pain.   Currently in Pain? Yes   Pain Score 7    Pain Location  Shoulder   Pain Orientation Left   Pain Descriptors / Indicators Aching;Sore   Pain Type Acute pain   Pain Onset More than a month ago   Pain Frequency Constant   Aggravating Factors  any movement   Pain Relieving Factors heat and rest                         [facility] Adult PT Treatment/Exercise - 07/27/16 0001      Exercises   Exercises Shoulder     Shoulder Exercises: Standing   Protraction Strengthening;Left;Theraband  3x10   Theraband Level (Shoulder Protraction) Level 1 (Yellow)   External Rotation Strengthening;Left;Theraband  3x10   Theraband Level (Shoulder External Rotation) Level 1 (Yellow)   Internal Rotation Strengthening;Left;Theraband  3x10   Theraband Level (Shoulder Internal Rotation) Level 1 (Yellow)   Row Strengthening;Left;Theraband  3x10   Theraband Level (Shoulder Row) Level 1 (Yellow)     Shoulder Exercises: Pulleys   Flexion 3 minutes   Other Pulley Exercises standing UE ranger for elevation and circles 2x10 each     Modalities   Modalities Moist Heat;Electrical Stimulation;Ultrasound     Moist Heat Therapy   Number Minutes Moist Heat 15 Minutes   Moist Heat Location Shoulder  Acupuncturist Location Left shoulder.   Electrical Stimulation Action premod   Electrical Stimulation Parameters 80-150hz  x47min   Electrical Stimulation Goals Pain     Ultrasound   Ultrasound Location left shoulder   Ultrasound Parameters 1.5w/cm2/50%/36mhz x7min   Ultrasound Goals Pain                PT Education - 07/27/16 1118    Education provided Yes   Education Details HEP   Person(s) Educated Patient   Methods Explanation;Demonstration;Handout   Comprehension Verbalized understanding;Returned demonstration          PT Short Term Goals - 07/24/16 1817      PT SHORT TERM GOAL #1   Title STG's=LTG's.           PT Long Term Goals - 07/24/16 1817      PT LONG TERM GOAL #1   Title  Independent with a HEP.   Time 6   Period Weeks   Status New     PT LONG TERM GOAL #2   Title Increase left shoulder ER to 85 degrees.   Time 6   Period Weeks   Status New     PT LONG TERM GOAL #3   Title Sleep 6 hours undisturbed.   Time 6   Period Weeks   Status New     PT LONG TERM GOAL #4   Title Perform ADL's with pain not > 2-3/10.   Time 6   Period Weeks   Status New               Plan - 07/27/16 1119    Clinical Impression Statement Patient tolerated treatment well. Patient reports ongoing soreness with any movement. Today focused on low level ROM, strengthening and modalities for pain. HEP given for corner stretch. Patient currnt goals ongoing due to pain and ROM deficts.   Rehab Potential Excellent   PT Frequency 2x / week   PT Duration 6 weeks   PT Treatment/Interventions ADLs/Self Care Home Management;Cryotherapy;Electrical Stimulation;Moist Heat;Ultrasound;Patient/family education;Therapeutic exercise;Therapeutic activities;Passive range of motion   PT Next Visit Plan cont with POC per MPT for Corner stretch to increase ER; RW4; [facility] ER; Combo e'stim/U/S; e'stim.   Consulted and Agree with Plan of Care Patient      Patient will benefit from skilled therapeutic intervention in order to improve the following deficits and impairments:  Pain, Decreased activity tolerance, Decreased range of motion  Visit Diagnosis: Acute pain of left shoulder     Problem List Patient Active Problem List   Diagnosis Date Noted  . History of colonic polyps 06/20/2016  . IBS (irritable bowel syndrome) 06/20/2016  . Abdominal pain, chronic, epigastric 06/20/2016  . BMI 40.0-44.9, adult ([place]) 11/22/2015  . Bilateral carpal tunnel syndrome 11/22/2015  . Tobacco abuse 11/22/2015  . Allergic rhinitis 11/01/2015  . GERD (gastroesophageal reflux disease) 11/01/2015  . LLQ pain 06/18/2015  . Bloating 06/18/2015  . Pain with urination 02/19/2015  . Moody 01/22/2015  .  Pelvic pain in female 01/13/2015  . Dysuria 01/13/2015  . Pelvic pressure in female 01/13/2015  . Vaginal pain 01/13/2015  . Hot flashes due to surgical menopause 01/13/2015  . Pre-diabetes 01/13/2015  . Hypertension 01/13/2015  . Migraines 01/13/2015  . Psoriasis 01/13/2015  . Arthritis 01/13/2015  . Fibromyalgia 01/13/2015  . COPD (chronic obstructive pulmonary disease) ([place]) 01/13/2015  . Hyperlipidemia 01/13/2015    Lamekia Nolden P, PTA 07/27/2016, 11:27 AM  [facility] [place], [place], [address] Phone: [phone]   Fax:  [phone]  Name: Judy Evans MRN: [id number] Date of Birth: [birth year]

## 2016-07-31 ENCOUNTER — Encounter: Payer: Self-pay | Admitting: Physical Therapy

## 2016-07-31 ENCOUNTER — Ambulatory Visit: Payer: Medicare HMO | Admitting: Physical Therapy

## 2016-07-31 DIAGNOSIS — M25512 Pain in left shoulder: Secondary | ICD-10-CM

## 2016-07-31 NOTE — Therapy (Signed)
[facility] [place], [place], [address] Phone: [phone]   Fax:  [phone]  Physical Therapy Treatment  Patient Details  Name: Judy Judy MRN: [id number] Date of Birth: 28-Apr-1971 Referring Provider: Gerrit Halls PA-C  Encounter Date: 07/31/2016      PT End of Session - 07/31/16 1022    Visit Number 3   Number of Visits 12   Date for PT Re-Evaluation 09/22/16   PT Start Time 1034   PT Stop Time 1119   PT Time Calculation (min) 45 min   Activity Tolerance Patient tolerated treatment well   Behavior During Therapy [facility] for tasks assessed/performed      Past Medical History:  Diagnosis Date  . Aphasia   . Arthritis   . COPD (chronic obstructive pulmonary disease) ([place])   . DDD (degenerative disc disease), cervical   . DDD (degenerative disc disease), lumbar   . Diabetes mellitus without complication ([place])   . Fibromyalgia   . GERD (gastroesophageal reflux disease)   . H. pylori infection   . Hyperlipidemia   . Hypertension   . IBS (irritable bowel syndrome)   . LLQ pain 06/18/2015  . Migraines   . Moody 01/22/2015  . Pain with urination 02/19/2015  . Psoriasis   . Scoliosis     Past Surgical History:  Procedure Laterality Date  . ABDOMINAL HYSTERECTOMY    . CARDIAC CATHETERIZATION  2015   patient reported it to be normal. "I was dying in pain during procedure".   . CARPAL TUNNEL RELEASE    . CESAREAN SECTION    . CHOLECYSTECTOMY    . COLONOSCOPY  06/2014   Burke Keels: normal  . ESOPHAGOGASTRODUODENOSCOPY  07/2014   Burke Keels: Normal  . FOOT SURGERY Bilateral   . rotator cuff surgery    . TONSILLECTOMY      There were no vitals filed for this visit.      Subjective Assessment - 07/31/16 1022    Subjective Reports that following last treatment it was rough but got better. Reports overall achiness secondary to her fibromyalgia.   Pertinent History Fibromyalgia.   Patient Stated  Goals Get out of pain.   Currently in Pain? No/denies            [facility] PT Assessment - 07/31/16 0001      Assessment   Medical Diagnosis Rotator cuff tendonitis,left.   Next MD Visit 08/21/2016     Precautions   Precautions None     Restrictions   Weight Bearing Restrictions No                     [facility] Adult PT Treatment/Exercise - 07/31/16 0001      Shoulder Exercises: Standing   Protraction Strengthening;Left;20 reps;Theraband   Theraband Level (Shoulder Protraction) Level 1 (Yellow)   External Rotation Strengthening;Left;20 reps;Theraband   Theraband Level (Shoulder External Rotation) Level 1 (Yellow)   Internal Rotation Strengthening;Left;20 reps;Theraband   Theraband Level (Shoulder Internal Rotation) Level 1 (Yellow)   Extension Strengthening;Left;20 reps;Theraband   Theraband Level (Shoulder Extension) Level 1 (Yellow)   Row Strengthening;Left;20 reps;Theraband   Theraband Level (Shoulder Row) Level 1 (Yellow)     Shoulder Exercises: Pulleys   Flexion Other (comment)  x5 min   Other Pulley Exercises standing UE ranger for elevation and circles 2x10 each     Modalities   Modalities Moist Heat;Electrical Stimulation;Ultrasound     Moist Heat Therapy   Number Minutes Moist  Heat 15 Minutes   Moist Heat Location Shoulder     Electrical Stimulation   Electrical Stimulation Location L shoulder    Electrical Stimulation Action Pre-Mod   Electrical Stimulation Parameters 80-150 hz x15 min   Electrical Stimulation Goals Pain     Ultrasound   Ultrasound Location L posteriolateral shoulder   Ultrasound Parameters 1.5 w/cm2, 100%, 1 mhz x10 min   Ultrasound Goals Pain                  PT Short Term Goals - 07/24/16 1817      PT SHORT TERM GOAL #1   Title STG's=LTG's.           PT Long Term Goals - 07/24/16 1817      PT LONG TERM GOAL #1   Title Independent with a HEP.   Time 6   Period Weeks   Status New     PT LONG TERM  GOAL #2   Title Increase left shoulder ER to 85 degrees.   Time 6   Period Weeks   Status New     PT LONG TERM GOAL #3   Title Sleep 6 hours undisturbed.   Time 6   Period Weeks   Status New     PT LONG TERM GOAL #4   Title Perform ADL's with pain not > 2-3/10.   Time 6   Period Weeks   Status New               Plan - 07/31/16 1105    Clinical Impression Statement Patient arrived to treatment with denial of any current L shoulder pain only overall achiness secondary to her fibromyalgia per patient report. Continued with AAROM exercises although she experienced discomfort with UE ranger as well as [place] four exercises per patient report. Normal modalities response noted following removal of the modalities. Greatest complaints of pain come with driving or laying down at night. Other complaints come with UE activities such as vacuuming or washing dishes.   Rehab Potential Excellent   PT Frequency 2x / week   PT Duration 6 weeks   PT Treatment/Interventions ADLs/Self Care Home Management;Cryotherapy;Electrical Stimulation;Moist Heat;Ultrasound;Patient/family education;Therapeutic exercise;Therapeutic activities;Passive range of motion   PT Next Visit Plan cont with POC per MPT for Corner stretch to increase ER; RW4; [facility] ER; Combo e'stim/U/S; e'stim.   Consulted and Agree with Plan of Care Patient      Patient will benefit from skilled therapeutic intervention in order to improve the following deficits and impairments:  Pain, Decreased activity tolerance, Decreased range of motion  Visit Diagnosis: Acute pain of left shoulder     Problem List Patient Active Problem List   Diagnosis Date Noted  . History of colonic polyps 06/20/2016  . IBS (irritable bowel syndrome) 06/20/2016  . Abdominal pain, chronic, epigastric 06/20/2016  . BMI 40.0-44.9, adult ([place]) 11/22/2015  . Bilateral carpal tunnel syndrome 11/22/2015  . Tobacco abuse 11/22/2015  . Allergic rhinitis  11/01/2015  . GERD (gastroesophageal reflux disease) 11/01/2015  . LLQ pain 06/18/2015  . Bloating 06/18/2015  . Pain with urination 02/19/2015  . Moody 01/22/2015  . Pelvic pain in female 01/13/2015  . Dysuria 01/13/2015  . Pelvic pressure in female 01/13/2015  . Vaginal pain 01/13/2015  . Hot flashes due to surgical menopause 01/13/2015  . Pre-diabetes 01/13/2015  . Hypertension 01/13/2015  . Migraines 01/13/2015  . Psoriasis 01/13/2015  . Arthritis 01/13/2015  . Fibromyalgia 01/13/2015  . COPD (chronic obstructive pulmonary  disease) ([place]) 01/13/2015  . Hyperlipidemia 01/13/2015    Wynelle Fanny, PTA 07/31/2016, 11:23 AM  [facility] [address] [place], [place], [address] Phone: [phone]   Fax:  [phone]  Name: Judy Judy MRN: [id number] Date of Birth: 10/02/1970

## 2016-08-03 ENCOUNTER — Ambulatory Visit: Payer: Medicare HMO | Admitting: Physical Therapy

## 2016-08-03 DIAGNOSIS — M25512 Pain in left shoulder: Secondary | ICD-10-CM | POA: Diagnosis not present

## 2016-08-03 NOTE — Therapy (Signed)
[facility] [place], [place], [address] Phone: [phone]   Fax:  [phone]  Physical Therapy Treatment  Patient Details  Name: Judy Evans MRN: [id number] Date of Birth: 05-15-1971 Referring Provider: Gerrit Halls PA-C  Encounter Date: 08/03/2016      PT End of Session - 08/03/16 1134    PT Start Time 1030   PT Stop Time 1120   PT Time Calculation (min) 50 min   Activity Tolerance Patient tolerated treatment well   Behavior During Therapy [facility] for tasks assessed/performed      Past Medical History:  Diagnosis Date  . Aphasia   . Arthritis   . COPD (chronic obstructive pulmonary disease) ([place])   . DDD (degenerative disc disease), cervical   . DDD (degenerative disc disease), lumbar   . Diabetes mellitus without complication ([place])   . Fibromyalgia   . GERD (gastroesophageal reflux disease)   . H. pylori infection   . Hyperlipidemia   . Hypertension   . IBS (irritable bowel syndrome)   . LLQ pain 06/18/2015  . Migraines   . Moody 01/22/2015  . Pain with urination 02/19/2015  . Psoriasis   . Scoliosis     Past Surgical History:  Procedure Laterality Date  . ABDOMINAL HYSTERECTOMY    . CARDIAC CATHETERIZATION  2015   patient reported it to be normal. "I was dying in pain during procedure".   . CARPAL TUNNEL RELEASE    . CESAREAN SECTION    . CHOLECYSTECTOMY    . COLONOSCOPY  06/2014   Burke Keels: normal  . ESOPHAGOGASTRODUODENOSCOPY  07/2014   Burke Keels: Normal  . FOOT SURGERY Bilateral   . rotator cuff surgery    . TONSILLECTOMY      There were no vitals filed for this visit.      Subjective Assessment - 08/03/16 1123    Subjective I'm hurting bad today.   Patient Stated Goals Get out of pain.   Pain Score 9    Pain Location Shoulder   Pain Orientation Left   Pain Descriptors / Indicators Aching;Sore   Pain Type Acute pain   Pain Onset More than a month ago                                    PT Short Term Goals - 07/24/16 1817      PT SHORT TERM GOAL #1   Title STG's=LTG's.           PT Long Term Goals - 07/24/16 1817      PT LONG TERM GOAL #1   Title Independent with a HEP.   Time 6   Period Weeks   Status New     PT LONG TERM GOAL #2   Title Increase left shoulder ER to 85 degrees.   Time 6   Period Weeks   Status New     PT LONG TERM GOAL #3   Title Sleep 6 hours undisturbed.   Time 6   Period Weeks   Status New     PT LONG TERM GOAL #4   Title Perform ADL's with pain not > 2-3/10.   Time 6   Period Weeks   Status New               Plan - 08/03/16 1137    PT Next Visit Plan Please see if re-cert for Dry  Needling has been signed.      Patient will benefit from skilled therapeutic intervention in order to improve the following deficits and impairments:  Pain, Decreased activity tolerance, Decreased range of motion  Visit Diagnosis: Acute pain of left shoulder - Plan: PT plan of care cert/re-cert     Problem List Patient Active Problem List   Diagnosis Date Noted  . History of colonic polyps 06/20/2016  . IBS (irritable bowel syndrome) 06/20/2016  . Abdominal pain, chronic, epigastric 06/20/2016  . BMI 40.0-44.9, adult ([place]) 11/22/2015  . Bilateral carpal tunnel syndrome 11/22/2015  . Tobacco abuse 11/22/2015  . Allergic rhinitis 11/01/2015  . GERD (gastroesophageal reflux disease) 11/01/2015  . LLQ pain 06/18/2015  . Bloating 06/18/2015  . Pain with urination 02/19/2015  . Moody 01/22/2015  . Pelvic pain in female 01/13/2015  . Dysuria 01/13/2015  . Pelvic pressure in female 01/13/2015  . Vaginal pain 01/13/2015  . Hot flashes due to surgical menopause 01/13/2015  . Pre-diabetes 01/13/2015  . Hypertension 01/13/2015  . Migraines 01/13/2015  . Psoriasis 01/13/2015  . Arthritis 01/13/2015  . Fibromyalgia 01/13/2015  . COPD (chronic obstructive pulmonary  disease) ([place]) 01/13/2015  . Hyperlipidemia 01/13/2015   Treatment:  Combo e'stim/U/S at 1.50 w/CM2 x 12 minutes to left UT and acromial ridge region f/b STW/M TP release technique x 11 minutes f/n HMP and pre-mod at 80-150 Hz (5 sec on and 5 sec off) x 15 minutes. [place], [place] MPT 08/03/2016, 11:38 AM  [facility] [address] [place], [place], [address] Phone: [phone]   Fax:  [phone]  Name: Judy Evans MRN: [id number] Date of Birth: Jan 31, 1971

## 2016-08-08 ENCOUNTER — Ambulatory Visit: Payer: Medicare HMO | Admitting: *Deleted

## 2016-08-08 DIAGNOSIS — M47817 Spondylosis without myelopathy or radiculopathy, lumbosacral region: Secondary | ICD-10-CM | POA: Diagnosis not present

## 2016-08-09 ENCOUNTER — Ambulatory Visit: Payer: Medicare HMO | Admitting: Physical Therapy

## 2016-08-09 ENCOUNTER — Encounter: Payer: Self-pay | Admitting: Physical Therapy

## 2016-08-09 DIAGNOSIS — M25512 Pain in left shoulder: Secondary | ICD-10-CM | POA: Diagnosis not present

## 2016-08-09 NOTE — Therapy (Signed)
[facility] [place], [place], [address] Phone: [phone]   Fax:  [phone]  Physical Therapy Treatment  Patient Details  Name: Judy Evans MRN: [id number] Date of Birth: 08-22-1971 Referring Provider: Gerrit Halls PA-C  Encounter Date: 08/09/2016      PT End of Session - 08/09/16 1117    Visit Number 5   Number of Visits 12   Date for PT Re-Evaluation 09/22/16   PT Start Time 1119   PT Stop Time 1205   PT Time Calculation (min) 46 min   Activity Tolerance Patient tolerated treatment well   Behavior During Therapy [facility] for tasks assessed/performed      Past Medical History:  Diagnosis Date  . Aphasia   . Arthritis   . COPD (chronic obstructive pulmonary disease) ([place])   . DDD (degenerative disc disease), cervical   . DDD (degenerative disc disease), lumbar   . Diabetes mellitus without complication ([place])   . Fibromyalgia   . GERD (gastroesophageal reflux disease)   . H. pylori infection   . Hyperlipidemia   . Hypertension   . IBS (irritable bowel syndrome)   . LLQ pain 06/18/2015  . Migraines   . Moody 01/22/2015  . Pain with urination 02/19/2015  . Psoriasis   . Scoliosis     Past Surgical History:  Procedure Laterality Date  . ABDOMINAL HYSTERECTOMY    . CARDIAC CATHETERIZATION  2015   patient reported it to be normal. "I was dying in pain during procedure".   . CARPAL TUNNEL RELEASE    . CESAREAN SECTION    . CHOLECYSTECTOMY    . COLONOSCOPY  06/2014   Burke Keels: normal  . ESOPHAGOGASTRODUODENOSCOPY  07/2014   Burke Keels: Normal  . FOOT SURGERY Bilateral   . rotator cuff surgery    . TONSILLECTOMY      There were no vitals filed for this visit.      Subjective Assessment - 08/09/16 1117    Subjective Reports that she is some better than last treatment. Reports that she went to pain MD and has 2 bulging discs in cervical spine.   Pertinent History Fibromyalgia.   Patient  Stated Goals Get out of pain.   Currently in Pain? Yes   Pain Score 3    Pain Location Shoulder   Pain Orientation Left   Pain Type Acute pain   Pain Onset More than a month ago            [facility] PT Assessment - 08/09/16 0001      Assessment   Medical Diagnosis Rotator cuff tendonitis,left.   Next MD Visit 08/21/2016     Precautions   Precautions None     Restrictions   Weight Bearing Restrictions No                     [facility] Adult PT Treatment/Exercise - 08/09/16 0001      Shoulder Exercises: Pulleys   Flexion Other (comment)  x6 min   Other Pulley Exercises Standing UE ranger into flexion x1 min secondary to L shoulder discomfort increasing     Modalities   Modalities Moist Heat;Electrical Stimulation;Ultrasound     Moist Heat Therapy   Number Minutes Moist Heat 15 Minutes   Moist Heat Location Shoulder     Electrical Stimulation   Electrical Stimulation Location L shoulder    Electrical Stimulation Action Pre-Mod   Electrical Stimulation Parameters 80-150 hz x15 min  Electrical Stimulation Goals Pain     Ultrasound   Ultrasound Location L posteriolateral shoulder/ UT   Ultrasound Parameters 1.5 w/cm2, 100%, 1 mhz x10 min   Ultrasound Goals Pain     Manual Therapy   Manual Therapy Soft tissue mobilization   Soft tissue mobilization STW to L UT, posteriolateral shoulder to decrease tightnes and pain                  PT Short Term Goals - 07/24/16 1817      PT SHORT TERM GOAL #1   Title STG's=LTG's.           PT Long Term Goals - 07/24/16 1817      PT LONG TERM GOAL #1   Title Independent with a HEP.   Time 6   Period Weeks   Status New     PT LONG TERM GOAL #2   Title Increase left shoulder ER to 85 degrees.   Time 6   Period Weeks   Status New     PT LONG TERM GOAL #3   Title Sleep 6 hours undisturbed.   Time 6   Period Weeks   Status New     PT LONG TERM GOAL #4   Title Perform ADL's with pain not >  2-3/10.   Time 6   Period Weeks   Status New               Plan - 08/09/16 1120    Clinical Impression Statement Patient arrived to treatment with overall decreased L shoulder pain from last treatment. Patient able to tolerate pulley exercise although standing LUE ranger she began exercising pain. Normal modalities response noted following removal of the modalities. Tightness noted in L UT upon palpation during manual therapy with good release noted. The greatest amount of pain comes with driving or with laying in bed at night per patient report. Patient reports that when pain begins it is hard to control but has stress going on as well.    Rehab Potential Excellent   PT Frequency 2x / week   PT Duration 6 weeks   PT Treatment/Interventions ADLs/Self Care Home Management;Cryotherapy;Electrical Stimulation;Moist Heat;Ultrasound;Patient/family education;Therapeutic exercise;Therapeutic activities;Passive range of motion   PT Next Visit Plan Please see if re-cert for Dry Needling has been signed. Complete MD note next treatment for MD visit 08/11/2016.   Consulted and Agree with Plan of Care Patient      Patient will benefit from skilled therapeutic intervention in order to improve the following deficits and impairments:  Pain, Decreased activity tolerance, Decreased range of motion  Visit Diagnosis: Acute pain of left shoulder     Problem List Patient Active Problem List   Diagnosis Date Noted  . History of colonic polyps 06/20/2016  . IBS (irritable bowel syndrome) 06/20/2016  . Abdominal pain, chronic, epigastric 06/20/2016  . BMI 40.0-44.9, adult ([place]) 11/22/2015  . Bilateral carpal tunnel syndrome 11/22/2015  . Tobacco abuse 11/22/2015  . Allergic rhinitis 11/01/2015  . GERD (gastroesophageal reflux disease) 11/01/2015  . LLQ pain 06/18/2015  . Bloating 06/18/2015  . Pain with urination 02/19/2015  . Moody 01/22/2015  . Pelvic pain in female 01/13/2015  . Dysuria  01/13/2015  . Pelvic pressure in female 01/13/2015  . Vaginal pain 01/13/2015  . Hot flashes due to surgical menopause 01/13/2015  . Pre-diabetes 01/13/2015  . Hypertension 01/13/2015  . Migraines 01/13/2015  . Psoriasis 01/13/2015  . Arthritis 01/13/2015  . Fibromyalgia 01/13/2015  .  COPD (chronic obstructive pulmonary disease) ([place]) 01/13/2015  . Hyperlipidemia 01/13/2015    Wynelle Fanny, PTA 08/09/2016, 12:14 PM  [facility] [address] [place], [place], [address] Phone: [phone]   Fax:  [phone]  Name: WANDER GENSON MRN: [id number] Date of Birth: 1970/10/06

## 2016-08-10 ENCOUNTER — Encounter: Payer: Self-pay | Admitting: Physical Therapy

## 2016-08-10 ENCOUNTER — Ambulatory Visit: Payer: Medicare HMO | Admitting: Physical Therapy

## 2016-08-10 DIAGNOSIS — M25512 Pain in left shoulder: Secondary | ICD-10-CM | POA: Diagnosis not present

## 2016-08-10 NOTE — Therapy (Signed)
[facility] [place], [place], [address] Phone: [phone]   Fax:  [phone]  Physical Therapy Treatment  Patient Details  Name: Judy Evans MRN: [id number] Date of Birth: Jul 29, 1971 Referring Provider: Gerrit Halls PA-C  Encounter Date: 08/10/2016      PT End of Session - 08/10/16 1508    Visit Number 6   Number of Visits 12   Date for PT Re-Evaluation 09/22/16   PT Start Time 2902   PT Stop Time 1520   PT Time Calculation (min) 45 min   Activity Tolerance Patient tolerated treatment well   Behavior During Therapy [facility] for tasks assessed/performed      Past Medical History:  Diagnosis Date  . Aphasia   . Arthritis   . COPD (chronic obstructive pulmonary disease) ([place])   . DDD (degenerative disc disease), cervical   . DDD (degenerative disc disease), lumbar   . Diabetes mellitus without complication ([place])   . Fibromyalgia   . GERD (gastroesophageal reflux disease)   . H. pylori infection   . Hyperlipidemia   . Hypertension   . IBS (irritable bowel syndrome)   . LLQ pain 06/18/2015  . Migraines   . Moody 01/22/2015  . Pain with urination 02/19/2015  . Psoriasis   . Scoliosis     Past Surgical History:  Procedure Laterality Date  . ABDOMINAL HYSTERECTOMY    . CARDIAC CATHETERIZATION  2015   patient reported it to be normal. "I was dying in pain during procedure".   . CARPAL TUNNEL RELEASE    . CESAREAN SECTION    . CHOLECYSTECTOMY    . COLONOSCOPY  06/2014   Burke Keels: normal  . ESOPHAGOGASTRODUODENOSCOPY  07/2014   Burke Keels: Normal  . FOOT SURGERY Bilateral   . rotator cuff surgery    . TONSILLECTOMY      There were no vitals filed for this visit.      Subjective Assessment - 08/10/16 1435    Subjective Reports that her shoulder is okay today only her knees want to buckle.   Pertinent History Fibromyalgia.   Patient Stated Goals Get out of pain.   Currently in Pain? Yes    Pain Score 3    Pain Location Shoulder   Pain Orientation Left   Pain Descriptors / Indicators Aching   Pain Type Acute pain   Pain Onset More than a month ago            [facility] PT Assessment - 08/10/16 0001      Assessment   Medical Diagnosis Rotator cuff tendonitis,left.   Next MD Visit 08/21/2016     Precautions   Precautions None     Restrictions   Weight Bearing Restrictions No                     [facility] Adult PT Treatment/Exercise - 08/10/16 0001      Shoulder Exercises: Standing   External Rotation Strengthening;Left;20 reps;Theraband   Theraband Level (Shoulder External Rotation) Level 1 (Yellow)   Internal Rotation Strengthening;Left;20 reps;Theraband   Theraband Level (Shoulder Internal Rotation) Level 1 (Yellow)   Extension Strengthening;Left;20 reps;Theraband   Theraband Level (Shoulder Extension) Level 1 (Yellow)   Row Strengthening;Left;20 reps;Theraband   Theraband Level (Shoulder Row) Level 1 (Yellow)     Shoulder Exercises: ROM/Strengthening   UBE (Upper Arm Bike) 120 RPM x5 min     Modalities   Modalities Moist Heat;Electrical Stimulation;Ultrasound     Moist  Heat Therapy   Number Minutes Moist Heat 15 Minutes   Moist Heat Location Shoulder     Electrical Stimulation   Electrical Stimulation Location L shoulder    Electrical Stimulation Action Pre-Mod   Electrical Stimulation Parameters 80-150 hz x15 min   Electrical Stimulation Goals Pain     Ultrasound   Ultrasound Location L posteriolateral shoulder   Ultrasound Parameters 1.5 w/cm2 ,100%, 1 mhz x10 min   Ultrasound Goals Pain     Manual Therapy   Manual Therapy Soft tissue mobilization   Soft tissue mobilization STW to L UT, posteriolateral shoulder to decrease tightnes and pain                  PT Short Term Goals - 07/24/16 1817      PT SHORT TERM GOAL #1   Title STG's=LTG's.           PT Long Term Goals - 08/10/16 1516      PT LONG TERM GOAL #1    Title Independent with a HEP.   Time 6   Period Weeks   Status Achieved     PT LONG TERM GOAL #2   Title Increase left shoulder ER to 85 degrees.   Time 6   Period Weeks   Status On-going     PT LONG TERM GOAL #3   Title Sleep 6 hours undisturbed.   Time 6   Period Weeks   Status On-going  Reports restless sleep secondary to overall pain per patient report 08/10/2016     PT LONG TERM GOAL #4   Title Perform ADL's with pain not > 2-3/10.   Time 6   Period Weeks   Status Partially Met  ADLs usually 2-3/10 unless lifting or vacuuming per patient report 08/10/2016               Plan - 08/10/16 1509    Clinical Impression Statement Patient tolerated today's treatment fairly well as she arrived with low level pain in L shoulder. Patient reported increased discomfort with UBE 120 RPM and continued discomfort with L shoulder strengthening with yellow theraband. Normal modalities response noted following removal of the modalities. Patient continues to present with superior L UT tightness as well as Teres tightness.   Rehab Potential Excellent   PT Frequency 2x / week   PT Duration 6 weeks   PT Treatment/Interventions ADLs/Self Care Home Management;Cryotherapy;Electrical Stimulation;Moist Heat;Ultrasound;Patient/family education;Therapeutic exercise;Therapeutic activities;Passive range of motion   PT Next Visit Plan Please see if re-cert for Dry Needling has been signed. Complete MD note next treatment for MD visit 08/11/2016.   Consulted and Agree with Plan of Care Patient      Patient will benefit from skilled therapeutic intervention in order to improve the following deficits and impairments:  Pain, Decreased activity tolerance, Decreased range of motion  Visit Diagnosis: Acute pain of left shoulder     Problem List Patient Active Problem List   Diagnosis Date Noted  . History of colonic polyps 06/20/2016  . IBS (irritable bowel syndrome) 06/20/2016  . Abdominal  pain, chronic, epigastric 06/20/2016  . BMI 40.0-44.9, adult ([place]) 11/22/2015  . Bilateral carpal tunnel syndrome 11/22/2015  . Tobacco abuse 11/22/2015  . Allergic rhinitis 11/01/2015  . GERD (gastroesophageal reflux disease) 11/01/2015  . LLQ pain 06/18/2015  . Bloating 06/18/2015  . Pain with urination 02/19/2015  . Moody 01/22/2015  . Pelvic pain in female 01/13/2015  . Dysuria 01/13/2015  . Pelvic pressure in female  01/13/2015  . Vaginal pain 01/13/2015  . Hot flashes due to surgical menopause 01/13/2015  . Pre-diabetes 01/13/2015  . Hypertension 01/13/2015  . Migraines 01/13/2015  . Psoriasis 01/13/2015  . Arthritis 01/13/2015  . Fibromyalgia 01/13/2015  . COPD (chronic obstructive pulmonary disease) ([place]) 01/13/2015  . Hyperlipidemia 01/13/2015   Ahmed Prima, PTA 08/10/16 4:00 PM Mali Applegate MPT [facility] [address] [place], [place], [address] Phone: [phone]   Fax:  [phone]  Name: RAZIAH FUNNELL MRN: [id number] Date of Birth: [birth year]

## 2016-08-11 DIAGNOSIS — M7582 Other shoulder lesions, left shoulder: Secondary | ICD-10-CM | POA: Diagnosis not present

## 2016-08-11 DIAGNOSIS — M25512 Pain in left shoulder: Secondary | ICD-10-CM | POA: Diagnosis not present

## 2016-08-15 ENCOUNTER — Ambulatory Visit: Payer: Medicare HMO | Admitting: *Deleted

## 2016-08-22 ENCOUNTER — Other Ambulatory Visit: Payer: Self-pay | Admitting: Pediatrics

## 2016-08-22 ENCOUNTER — Ambulatory Visit (HOSPITAL_COMMUNITY)
Admission: RE | Admit: 2016-08-22 | Discharge: 2016-08-22 | Disposition: A | Discharge status: Home / Self Care | Payer: Medicare HMO | Source: Ambulatory Visit | Attending: Internal Medicine | Admitting: Internal Medicine

## 2016-08-22 ENCOUNTER — Ambulatory Visit: Payer: Medicare HMO | Admitting: *Deleted

## 2016-08-22 ENCOUNTER — Other Ambulatory Visit: Payer: Self-pay

## 2016-08-22 ENCOUNTER — Ambulatory Visit (INDEPENDENT_AMBULATORY_CARE_PROVIDER_SITE_OTHER): Payer: Medicare HMO | Admitting: Internal Medicine

## 2016-08-22 VITALS — BP 134/80 | HR 69 | Temp 97.8°F | Ht 62.0 in | Wt 218.0 lb

## 2016-08-22 DIAGNOSIS — K5900 Constipation, unspecified: Secondary | ICD-10-CM | POA: Diagnosis not present

## 2016-08-22 DIAGNOSIS — R110 Nausea: Secondary | ICD-10-CM | POA: Diagnosis not present

## 2016-08-22 DIAGNOSIS — R1013 Epigastric pain: Secondary | ICD-10-CM | POA: Diagnosis not present

## 2016-08-22 DIAGNOSIS — R194 Change in bowel habit: Secondary | ICD-10-CM

## 2016-08-22 DIAGNOSIS — G8929 Other chronic pain: Secondary | ICD-10-CM

## 2016-08-22 DIAGNOSIS — J449 Chronic obstructive pulmonary disease, unspecified: Secondary | ICD-10-CM

## 2016-08-22 DIAGNOSIS — R109 Unspecified abdominal pain: Secondary | ICD-10-CM | POA: Diagnosis not present

## 2016-08-22 NOTE — Progress Notes (Signed)
Primary Care Physician:  Eustaquio Maize, MD Primary Gastroenterologist:  Dr. Gala Romney  Pre-Procedure History & Physical: HPI:  Judy Evans is a 45 y.o. female here for follow-up of multiple GI complaints. History of GERD epigastric/ left upper quadrant abdominal pain alternating constipation and diarrhea. Evaluated here recently. Course of Bentyl did not help her symptoms. She is nauseated nearly all the time - does not vomit. Workup for infectious causes of diarrhea negative with prior stool studies.  Takes 8 mg Dilaudid daily per routine. Vague dysphagia to solids. No systemic nonsteroidal; she does use Voltaren gel. No melena or hematochezia.  Took some Citrucel since she was last seen here -  made no difference. Nauseated a good bit of the time.  She does report breakthrough reflux symptoms weekly on omeprazole 20 mg daily. She's gained 2 pounds since her last visit. Followed closely by Dr. Francesco Runner with pain management.  Reported negative EGD and colonoscopy in [place], [place] -  due for surveillance colonoscopy 2020 - history of polyps.  Since last being seen by her TTG IgA and total IgA came back within normal limits.    Past Medical History:  Diagnosis Date  . Aphasia   . Arthritis   . COPD (chronic obstructive pulmonary disease) ([place])   . DDD (degenerative disc disease), cervical   . DDD (degenerative disc disease), lumbar   . Diabetes mellitus without complication ([place])   . Fibromyalgia   . GERD (gastroesophageal reflux disease)   . H. pylori infection   . Hyperlipidemia   . Hypertension   . IBS (irritable bowel syndrome)   . LLQ pain 06/18/2015  . Migraines   . Moody 01/22/2015  . Pain with urination 02/19/2015  . Psoriasis   . Scoliosis     Past Surgical History:  Procedure Laterality Date  . ABDOMINAL HYSTERECTOMY    . CARDIAC CATHETERIZATION  2015   patient reported it to be normal. "I was dying in pain during procedure".   . CARPAL TUNNEL RELEASE    . CESAREAN  SECTION    . CHOLECYSTECTOMY    . COLONOSCOPY  06/2014   Burke Keels: normal  . ESOPHAGOGASTRODUODENOSCOPY  07/2014   Burke Keels: Normal  . FOOT SURGERY Bilateral   . rotator cuff surgery    . TONSILLECTOMY      Prior to Admission medications   Medication Sig Start Date End Date Taking? Authorizing Provider  albuterol (VENTOLIN HFA) 108 (90 Base) MCG/ACT inhaler INHALE 2 PUFFS INTO THE LUNGS EVERY 4 (FOUR) HOURS AS NEEDED FOR WHEEZING OR SHORTNESS OF BREATH. 04/20/16  Yes Eustaquio Maize, MD  cetirizine (ZYRTEC) 10 MG tablet TAKE 1 TABLET (10 MG TOTAL) BY MOUTH DAILY. 01/25/16  Yes Eustaquio Maize, MD  diclofenac sodium (VOLTAREN) 1 % GEL Apply 4 g topically 4 (four) times daily. 04/20/16  Yes Eustaquio Maize, MD  dicyclomine (BENTYL) 10 MG capsule 1-2 capsule before meals and at bedtime as needed for abdominal pain and diarrhea. Hold for constipation. 06/20/16  Yes Mahala Menghini, PA-C  DULoxetine (CYMBALTA) 30 MG capsule  06/12/16  Yes Historical Provider, MD  fluticasone (FLONASE) 50 MCG/ACT nasal spray Place 2 sprays into both nostrils daily. 07/25/16  Yes Sharion Balloon, FNP  gabapentin (NEURONTIN) 800 MG tablet Take 800 mg by mouth 4 (four) times daily.   Yes Historical Provider, MD  HYDROmorphone (DILAUDID) 2 MG tablet Take 2 mg by mouth every 6 (six) hours as needed. 12/06/15  Yes  Historical Provider, MD  lidocaine (LIDODERM) 5 % PLACE 1 PATCH ONTO THE SKIN DAILY. REMOVE & DISCARD PATCH WITHIN 12 HOURS OR AS DIRECTED BY MD 07/11/16  Yes Eustaquio Maize, MD  omeprazole (PRILOSEC) 20 MG capsule TAKE 1 CAPSULE (20 MG TOTAL) BY MOUTH DAILY. 05/30/16  Yes Eustaquio Maize, MD  SYMBICORT 80-4.5 MCG/ACT inhaler  07/22/16  Yes Historical Provider, MD  doxycycline (VIBRA-TABS) 100 MG tablet Take 1 tablet (100 mg total) by mouth 2 (two) times daily. Patient not taking: Reported on 08/22/2016 07/25/16   Sharion Balloon, FNP    Allergies as of 08/22/2016 - Review Complete 08/22/2016  Allergen  Reaction Noted  . Rosuvastatin calcium Swelling 11/25/2014  . Statins Swelling 11/25/2014  . Hctz [hydrochlorothiazide] Nausea And Vomiting and Rash 01/12/2014  . Hydrocodone-acetaminophen Rash 04/13/2016  . Oxycodone Rash 04/13/2016  . Penicillins Nausea And Vomiting and Rash 01/12/2014    Family History  Problem Relation Age of Onset  . COPD Mother   . Diabetes Mother   . Osteoarthritis Mother   . Healthy Father   . Lupus Sister   . CAD Sister   . Diabetes Sister   . Early death Brother     pneumonia  . Asthma Daughter   . Asthma Son   . Cancer Maternal Grandmother 110    colon  . Arthritis Maternal Grandmother   . Diabetes Maternal Grandmother   . Dementia Maternal Grandmother   . Other Maternal Grandfather     brain tumor  . Cancer Paternal Grandmother     breast  . Cancer Paternal Grandfather     lung  . Factor V Leiden deficiency Sister     Social History   Social History  . Marital status: Married    Spouse name: N/A  . Number of children: N/A  . Years of education: N/A   Occupational History  . Not on file.   Social History Main Topics  . Smoking status: Current Every Day Smoker    Packs/day: 1.50    Years: 30.00    Types: Cigarettes  . Smokeless tobacco: Never Used  . Alcohol use No  . Drug use: No  . Sexual activity: Not Currently    Birth control/ protection: Surgical     Comment: hyst   Other Topics Concern  . Not on file   Social History Narrative  . No narrative on file    Review of Systems: See HPI, otherwise negative ROS  Physical Exam: BP 134/80   Pulse 69   Temp 97.8 F (36.6 C) (Oral)   Ht 5\' 2"  (1.575 m)   Wt 218 lb (98.9 kg)   BMI 39.87 kg/m  General:   pleasant and cooperative in NAD.  Obese. Skin:  Intact without significant lesions or rashes.. Multiple tattoos. Eyes:  Sclera clear, no icterus.   Conjunctiva pink. Ears:  Normal auditory acuity. Nose:  No deformity, discharge,  or lesions. Mouth:  No deformity or  lesions. Neck:  Supple; no masses or thyromegaly. No significant cervical adenopathy. Lungs:  Clear throughout to auscultation.   No wheezes, crackles, or rhonchi. No acute distress. Heart:  Regular rate and rhythm; no murmurs, clicks, rubs,  or gallops. Abdomen: Obese. Non-distended, normal bowel sounds.  Soft and nontender without appreciable mass or hepatosplenomegaly.  Rectal: No mass in the rectal vault. Scant brown stool in the rectal vault Hemoccult negative Pulses:  Normal pulses noted. Extremities:  Without clubbing or edema.  Impression:  45 year old lady  on heavy duty daily narcotic therapy presents with a vague symptoms of epigastric left upper quadrant abdominal pain alternating constipation and diarrhea. Vague dysphagia and breakthrough reflux symptoms.  No fecal impaction on exam today. She perceives when she has a bowel movement she empties out well. Course of Bentyl did not help.  Need to rule out ongoing pathology in her upper GI tract. Dysphagia needs further evaluation. She could be having an element of spurious diarrhea in the setting of opioid-induced constipation.  An underlying motility disorder of the GI tract may be contributing to her symptoms.  An element of opioid-induced hyperalgesia likely inter-woven into the clinical picture.  Recommendations:  I have offered the patient a diagnostic EGD in the near future to further evaluate her symptoms.  The risks, benefits, limitations, alternatives and imponderables have been reviewed with the patient. Potential for esophageal dilation, biopsy, etc. have also been reviewed.  Questions have been answered. All parties agreeable. We'll utilize deep sedation with anesthesia.  KUB now to assess stool load in her colon.  Further recommendations to follow.         Notice: This dictation was prepared with Dragon dictation along with smaller phrase technology. Any transcriptional errors that result from this process are  unintentional and may not be corrected upon review.

## 2016-08-22 NOTE — Patient Instructions (Signed)
KUB now to assess stool load in:  Schedule EGD with propofol. Epigastric pain dysphagia.   Further recommendations to follow.

## 2016-08-23 ENCOUNTER — Other Ambulatory Visit: Payer: Self-pay

## 2016-08-23 ENCOUNTER — Telehealth: Payer: Self-pay | Admitting: Internal Medicine

## 2016-08-23 DIAGNOSIS — R1310 Dysphagia, unspecified: Secondary | ICD-10-CM

## 2016-08-23 NOTE — Telephone Encounter (Signed)
Talked with patient and she is set up for 09/14/16. Instructions are in the mail

## 2016-08-23 NOTE — Telephone Encounter (Signed)
LMOM to call back

## 2016-08-23 NOTE — Telephone Encounter (Signed)
Pt was seen yesterday and was told to call first thing this morning to schedule her EGD. Please call [phone]

## 2016-08-24 ENCOUNTER — Ambulatory Visit: Payer: Medicare HMO | Admitting: Physical Therapy

## 2016-08-24 ENCOUNTER — Encounter: Payer: Self-pay | Admitting: Physical Therapy

## 2016-08-24 DIAGNOSIS — M25512 Pain in left shoulder: Secondary | ICD-10-CM | POA: Diagnosis not present

## 2016-08-24 NOTE — Therapy (Signed)
[facility] [place], [place], [address] Phone: [phone]   Fax:  [phone]  Physical Therapy Treatment  Patient Details  Name: Judy Evans MRN: [id number] Date of Birth: 06-Oct-1970 Referring Provider: Gerrit Halls PA-C  Encounter Date: 08/24/2016      PT End of Session - 08/24/16 1437    Visit Number 7   Number of Visits 24  per NO from MD for 2-3/week for 4 weeks signed on 08/11/2016   Date for PT Re-Evaluation 09/22/16   PT Start Time 1433   PT Stop Time 1523   PT Time Calculation (min) 50 min   Activity Tolerance Patient tolerated treatment well   Behavior During Therapy [facility] for tasks assessed/performed      Past Medical History:  Diagnosis Date  . Aphasia   . Arthritis   . COPD (chronic obstructive pulmonary disease) ([place])   . DDD (degenerative disc disease), cervical   . DDD (degenerative disc disease), lumbar   . Diabetes mellitus without complication ([place])   . Fibromyalgia   . GERD (gastroesophageal reflux disease)   . H. pylori infection   . Hyperlipidemia   . Hypertension   . IBS (irritable bowel syndrome)   . LLQ pain 06/18/2015  . Migraines   . Moody 01/22/2015  . Pain with urination 02/19/2015  . Psoriasis   . Scoliosis     Past Surgical History:  Procedure Laterality Date  . ABDOMINAL HYSTERECTOMY    . CARDIAC CATHETERIZATION  2015   patient reported it to be normal. "I was dying in pain during procedure".   . CARPAL TUNNEL RELEASE    . CESAREAN SECTION    . CHOLECYSTECTOMY    . COLONOSCOPY  06/2014   Burke Keels: normal  . ESOPHAGOGASTRODUODENOSCOPY  07/2014   Burke Keels: Normal  . FOOT SURGERY Bilateral   . rotator cuff surgery    . TONSILLECTOMY      There were no vitals filed for this visit.      Subjective Assessment - 08/24/16 1435    Subjective Reports that the orthopedic MD thinks that her pain may be from bulging discs in her neck. Reports that her arm  is sore and has pain down LUE. States that she is supposed to go to pain MD 09/04/2017.   Pertinent History Fibromyalgia.   Patient Stated Goals Get out of pain.   Currently in Pain? Yes   Pain Score 4    Pain Location Shoulder   Pain Orientation Left   Pain Descriptors / Indicators Aching;Other (Comment)  "weak"   Pain Type Acute pain   Pain Onset More than a month ago   Pain Frequency Constant            [facility] PT Assessment - 08/24/16 0001      Assessment   Medical Diagnosis Rotator cuff tendonitis,left.   Next MD Visit 09/04/2016  to pain MD     Precautions   Precautions None     Restrictions   Weight Bearing Restrictions No                     [facility] Adult PT Treatment/Exercise - 08/24/16 0001      Shoulder Exercises: Standing   External Rotation Strengthening;Left;20 reps;Theraband   Theraband Level (Shoulder External Rotation) Level 1 (Yellow)   Internal Rotation Strengthening;Left;20 reps;Theraband   Theraband Level (Shoulder Internal Rotation) Level 1 (Yellow)   Extension Strengthening;Left;20 reps;Theraband   Theraband Level (Shoulder  Extension) Level 1 (Yellow)   Row Strengthening;Left;20 reps;Theraband   Theraband Level (Shoulder Row) Level 1 (Yellow)   Other Standing Exercises LUE wall slides x20 reps     Shoulder Exercises: Pulleys   Flexion Other (comment)  x5 min   Other Pulley Exercises Standing UE ranger into flexion x1 min secondary to L shoulder discomfort increasing     Modalities   Modalities Moist Heat;Electrical Stimulation;Ultrasound     Moist Heat Therapy   Number Minutes Moist Heat 15 Minutes   Moist Heat Location Shoulder     Electrical Stimulation   Electrical Stimulation Location L shoulder    Electrical Stimulation Action Pre-Mod   Electrical Stimulation Parameters 80-150 hz x15 min   Electrical Stimulation Goals Pain     Ultrasound   Ultrasound Location L posteriolateral shoulder   Ultrasound Parameters 1.5  w/cm2, 100%, 1 mhz x10 min   Ultrasound Goals Pain                  PT Short Term Goals - 07/24/16 1817      PT SHORT TERM GOAL #1   Title STG's=LTG's.           PT Long Term Goals - 08/10/16 1516      PT LONG TERM GOAL #1   Title Independent with a HEP.   Time 6   Period Weeks   Status Achieved     PT LONG TERM GOAL #2   Title Increase left shoulder ER to 85 degrees.   Time 6   Period Weeks   Status On-going     PT LONG TERM GOAL #3   Title Sleep 6 hours undisturbed.   Time 6   Period Weeks   Status On-going  Reports restless sleep secondary to overall pain per patient report 08/10/2016     PT LONG TERM GOAL #4   Title Perform ADL's with pain not > 2-3/10.   Time 6   Period Weeks   Status Partially Met  ADLs usually 2-3/10 unless lifting or vacuuming per patient report 08/10/2016               Plan - 08/24/16 1517    Clinical Impression Statement Patient able to tolerate therapeutic exercises today with no other complaints that L shoulder ache. Patient required minimal multimodal cueing for proper exercise technique. Patient continues to report tenderness in posteriorlateral L shoulder today. Normal modalities response noted following removal of the modalties. Patient continues to experience L shoulder pain with use of LUE per patient report.   Rehab Potential Excellent   PT Frequency 2x / week   PT Duration 6 weeks   PT Treatment/Interventions ADLs/Self Care Home Management;Cryotherapy;Electrical Stimulation;Moist Heat;Ultrasound;Patient/family education;Therapeutic exercise;Therapeutic activities;Passive range of motion   PT Next Visit Plan Continue per MPT POC and patient has approval for dry needling per latest MD order.   Consulted and Agree with Plan of Care Patient      Patient will benefit from skilled therapeutic intervention in order to improve the following deficits and impairments:  Pain, Decreased activity tolerance, Decreased  range of motion  Visit Diagnosis: Acute pain of left shoulder     Problem List Patient Active Problem List   Diagnosis Date Noted  . History of colonic polyps 06/20/2016  . IBS (irritable bowel syndrome) 06/20/2016  . Abdominal pain, chronic, epigastric 06/20/2016  . BMI 40.0-44.9, adult ([place]) 11/22/2015  . Bilateral carpal tunnel syndrome 11/22/2015  . Tobacco abuse 11/22/2015  . Allergic rhinitis  11/01/2015  . GERD (gastroesophageal reflux disease) 11/01/2015  . LLQ pain 06/18/2015  . Bloating 06/18/2015  . Pain with urination 02/19/2015  . Moody 01/22/2015  . Pelvic pain in female 01/13/2015  . Dysuria 01/13/2015  . Pelvic pressure in female 01/13/2015  . Vaginal pain 01/13/2015  . Hot flashes due to surgical menopause 01/13/2015  . Pre-diabetes 01/13/2015  . Hypertension 01/13/2015  . Migraines 01/13/2015  . Psoriasis 01/13/2015  . Arthritis 01/13/2015  . Fibromyalgia 01/13/2015  . COPD (chronic obstructive pulmonary disease) ([place]) 01/13/2015  . Hyperlipidemia 01/13/2015    Wynelle Fanny, PTA 08/24/2016, 3:28 PM  [facility] [address] [place], [place], [address] Phone: [phone]   Fax:  [phone]  Name: Judy Evans MRN: [id number] Date of Birth: 08/10/1971

## 2016-08-28 ENCOUNTER — Ambulatory Visit: Payer: Medicare HMO | Attending: Physician Assistant | Admitting: Physical Therapy

## 2016-08-28 DIAGNOSIS — M25512 Pain in left shoulder: Secondary | ICD-10-CM | POA: Diagnosis not present

## 2016-08-28 NOTE — Therapy (Signed)
[facility] [place], [place], [address] Phone: [phone]   Fax:  [phone]  Physical Therapy Treatment  Patient Details  Name: Judy Evans MRN: [id number] Date of Birth: 1971-05-08 Referring Provider: Gerrit Halls PA-C  Encounter Date: 08/28/2016      PT End of Session - 08/28/16 1429    Visit Number 8   Number of Visits 24   Date for PT Re-Evaluation 09/22/16   Authorization Type GCODE REQUIRED 10TH VISIT   PT Start Time 1430   PT Stop Time 1531   PT Time Calculation (min) 61 min   Activity Tolerance Patient tolerated treatment well   Behavior During Therapy [facility] for tasks assessed/performed      Past Medical History:  Diagnosis Date  . Aphasia   . Arthritis   . COPD (chronic obstructive pulmonary disease) ([place])   . DDD (degenerative disc disease), cervical   . DDD (degenerative disc disease), lumbar   . Diabetes mellitus without complication ([place])   . Fibromyalgia   . GERD (gastroesophageal reflux disease)   . H. pylori infection   . Hyperlipidemia   . Hypertension   . IBS (irritable bowel syndrome)   . LLQ pain 06/18/2015  . Migraines   . Moody 01/22/2015  . Pain with urination 02/19/2015  . Psoriasis   . Scoliosis     Past Surgical History:  Procedure Laterality Date  . ABDOMINAL HYSTERECTOMY    . CARDIAC CATHETERIZATION  2015   patient reported it to be normal. "I was dying in pain during procedure".   . CARPAL TUNNEL RELEASE    . CESAREAN SECTION    . CHOLECYSTECTOMY    . COLONOSCOPY  06/2014   Burke Keels: normal  . ESOPHAGOGASTRODUODENOSCOPY  07/2014   Burke Keels: Normal  . FOOT SURGERY Bilateral   . rotator cuff surgery    . TONSILLECTOMY      There were no vitals filed for this visit.      Subjective Assessment - 08/28/16 1436    Subjective Patient reports pain mainly in her left neck and shoulder which MD feels is likely coming from cervical discs. She reports  diffuse pain up into head as well.   Pertinent History Fibromyalgia.   Patient Stated Goals Get out of pain.   Currently in Pain? Yes   Pain Score 4    Pain Location Shoulder   Pain Orientation Left   Pain Descriptors / Indicators Aching   Pain Type Acute pain   Pain Onset More than a month ago   Pain Frequency Constant   Aggravating Factors  any movement and even sleeping   Pain Relieving Factors heat and rest                         [facility] Adult PT Treatment/Exercise - 08/28/16 0001      Shoulder Exercises: Supine   Other Supine Exercises neck retraction x 5 and with hold     Modalities   Modalities Electrical Stimulation;Moist Heat     Moist Heat Therapy   Number Minutes Moist Heat 15 Minutes   Moist Heat Location Cervical     Electrical Stimulation   Electrical Stimulation Location L shoulder and neck premod 80-150 Hz x 15 min to tolerance   Electrical Stimulation Goals Pain     Manual Therapy   Manual Therapy Soft tissue mobilization   Soft tissue mobilization to B UT, cervical paraspinals and suboccipitals.  Trigger Point Dry Needling - 08/28/16 1520    Consent Given? Yes   Education Handout Provided Yes   Muscles Treated Upper Body Upper trapezius;Suboccipitals muscle group;Levator scapulae  cervical multifid C2/3/4/5 and L deltoid   Upper Trapezius Response --  L no twitch elicited   SubOccipitals Response Twitch response elicited;Palpable increased muscle length  B   Levator Scapulae Response Twitch response elicited;Palpable increased muscle length  L              PT Education - 08/28/16 1521    Education provided Yes   Education Details DN education and aftercare; HEP cervical retraction    Person(s) Educated Patient   Methods Explanation;Demonstration;Handout   Comprehension Verbalized understanding;Returned demonstration          PT Short Term Goals - 07/24/16 1817      PT SHORT TERM GOAL #1   Title  STG's=LTG's.           PT Long Term Goals - 08/28/16 1537      PT LONG TERM GOAL #3   Title Sleep 6 hours undisturbed.   Baseline 3-4 hours (08/28/16)   Time 6   Period Weeks   Status On-going               Plan - 08/28/16 1522    Clinical Impression Statement Patient assessed for TPDN and she has a tight L QL, depressed L shoulder. She has tender points in UT B but no TPs noted. She has marked tightness and TPs in B suboccipitals with TPs in cervical multifidi and L deltoids as well. She responded well to DN. Marland Kitchen   Rehab Potential Excellent   PT Frequency 2x / week   PT Duration 6 weeks   PT Treatment/Interventions ADLs/Self Care Home Management;Cryotherapy;Electrical Stimulation;Moist Heat;Ultrasound;Patient/family education;Therapeutic exercise;Therapeutic activities;Passive range of motion;Dry needling   PT Next Visit Plan Assess DN. Continue in cspine as indicated. Work on posture and postural strengthening/pec stretching.   PT Home Exercise Plan cervical retraction   Consulted and Agree with Plan of Care Patient      Patient will benefit from skilled therapeutic intervention in order to improve the following deficits and impairments:  Pain, Decreased activity tolerance, Decreased range of motion  Visit Diagnosis: Acute pain of left shoulder     Problem List Patient Active Problem List   Diagnosis Date Noted  . History of colonic polyps 06/20/2016  . IBS (irritable bowel syndrome) 06/20/2016  . Abdominal pain, chronic, epigastric 06/20/2016  . BMI 40.0-44.9, adult ([place]) 11/22/2015  . Bilateral carpal tunnel syndrome 11/22/2015  . Tobacco abuse 11/22/2015  . Allergic rhinitis 11/01/2015  . GERD (gastroesophageal reflux disease) 11/01/2015  . LLQ pain 06/18/2015  . Bloating 06/18/2015  . Pain with urination 02/19/2015  . Moody 01/22/2015  . Pelvic pain in female 01/13/2015  . Dysuria 01/13/2015  . Pelvic pressure in female 01/13/2015  . Vaginal pain  01/13/2015  . Hot flashes due to surgical menopause 01/13/2015  . Pre-diabetes 01/13/2015  . Hypertension 01/13/2015  . Migraines 01/13/2015  . Psoriasis 01/13/2015  . Arthritis 01/13/2015  . Fibromyalgia 01/13/2015  . COPD (chronic obstructive pulmonary disease) ([place]) 01/13/2015  . Hyperlipidemia 01/13/2015   Suhas Estis PT 08/28/2016, 3:47 PM  [facility] [address] [place], [place], [address] Phone: [phone]   Fax:  [phone]  Name: SAMONA CROWELL MRN: [id number] Date of Birth: 09-19-1971

## 2016-08-28 NOTE — Patient Instructions (Signed)
Trigger Point Dry Needling  . What is Trigger Point Dry Needling (DN)? o DN is a physical therapy technique used to treat muscle pain and dysfunction. Specifically, DN helps deactivate muscle trigger points (muscle knots).  o A thin filiform needle is used to penetrate the skin and stimulate the underlying trigger point. The goal is for a local twitch response (LTR) to occur and for the trigger point to relax. No medication of any kind is injected during the procedure.   . What Does Trigger Point Dry Needling Feel Like?  o The procedure feels different for each individual patient. Some patients report that they do not actually feel the needle enter the skin and overall the process is not painful. Very mild bleeding may occur. However, many patients feel a deep cramping in the muscle in which the needle was inserted. This is the local twitch response.   Marland Kitchen How Will I feel after the treatment? o Soreness is normal, and the onset of soreness may not occur for a few hours. Typically this soreness does not last longer than two days.  o Bruising is uncommon, however; ice can be used to decrease any possible bruising.  o In rare cases feeling tired or nauseous after the treatment is normal. In addition, your symptoms may get worse before they get better, this period will typically not last longer than 24 hours.   . What Can I do After My Treatment? o Increase your hydration by drinking more water for the next 24 hours. o You may place ice or heat on the areas treated that have become sore, however, do not use heat on inflamed or bruised areas. Heat often brings more relief post needling. o You can continue your regular activities, but vigorous activity is not recommended initially after the treatment for 24 hours. o DN is best combined with other physical therapy such as strengthening, stretching, and other therapies.    Precautions:  In some cases, dry needling is done over the lung field. While rare,  there is a risk of pneumothorax (punctured lung). Because of this, if you ever experience shortness of breath on exertion, difficulty taking a deep breath, chest pain or a dry cough following dry needling, you should report to an emergency room and tell them that you have been dry needled over the thorax.  Madelyn Flavors, PT 08/28/16 2:30 PM [facility] [place], [place], [address] Phone: [phone]   Fax:  [phone]

## 2016-09-04 ENCOUNTER — Encounter: Payer: Medicare HMO | Admitting: Physical Therapy

## 2016-09-04 DIAGNOSIS — M47817 Spondylosis without myelopathy or radiculopathy, lumbosacral region: Secondary | ICD-10-CM | POA: Diagnosis not present

## 2016-09-04 DIAGNOSIS — G8929 Other chronic pain: Secondary | ICD-10-CM | POA: Diagnosis not present

## 2016-09-04 DIAGNOSIS — M47812 Spondylosis without myelopathy or radiculopathy, cervical region: Secondary | ICD-10-CM | POA: Diagnosis not present

## 2016-09-04 DIAGNOSIS — Z79891 Long term (current) use of opiate analgesic: Secondary | ICD-10-CM | POA: Diagnosis not present

## 2016-09-05 ENCOUNTER — Ambulatory Visit: Payer: Medicare HMO | Admitting: Physical Therapy

## 2016-09-05 DIAGNOSIS — M25512 Pain in left shoulder: Secondary | ICD-10-CM | POA: Diagnosis not present

## 2016-09-05 NOTE — Therapy (Signed)
[facility] [place], [place], [address] Phone: [phone]   Fax:  [phone]  Physical Therapy Treatment  Patient Details  Name: Judy Evans MRN: [id number] Date of Birth: 1971-08-28 Referring Provider: Gerrit Halls PA-C  Encounter Date: 09/05/2016      PT End of Session - 09/05/16 1441    Visit Number 9   Number of Visits 24   Date for PT Re-Evaluation 09/22/16   Authorization Type GCODE REQUIRED 10TH VISIT   PT Start Time 1439   PT Stop Time 1532   PT Time Calculation (min) 53 min   Activity Tolerance Patient tolerated treatment well   Behavior During Therapy [facility] for tasks assessed/performed      Past Medical History:  Diagnosis Date  . Aphasia   . Arthritis   . COPD (chronic obstructive pulmonary disease) ([place])   . DDD (degenerative disc disease), cervical   . DDD (degenerative disc disease), lumbar   . Diabetes mellitus without complication ([place])   . Fibromyalgia   . GERD (gastroesophageal reflux disease)   . H. pylori infection   . Hyperlipidemia   . Hypertension   . IBS (irritable bowel syndrome)   . LLQ pain 06/18/2015  . Migraines   . Moody 01/22/2015  . Pain with urination 02/19/2015  . Psoriasis   . Scoliosis     Past Surgical History:  Procedure Laterality Date  . ABDOMINAL HYSTERECTOMY    . CARDIAC CATHETERIZATION  2015   patient reported it to be normal. "I was dying in pain during procedure".   . CARPAL TUNNEL RELEASE    . CESAREAN SECTION    . CHOLECYSTECTOMY    . COLONOSCOPY  06/2014   Burke Keels: normal  . ESOPHAGOGASTRODUODENOSCOPY  07/2014   Burke Keels: Normal  . FOOT SURGERY Bilateral   . rotator cuff surgery    . TONSILLECTOMY      There were no vitals filed for this visit.      Subjective Assessment - 09/05/16 1441    Subjective Patient states she had some increased soreness with needling. Her pain MD wants her to continue the needling.   Pertinent  History Fibromyalgia.   Patient Stated Goals Get out of pain.   Currently in Pain? Yes   Pain Score 5    Pain Location Shoulder  UT   Pain Orientation Left   Pain Descriptors / Indicators Aching   Pain Type Acute pain   Pain Onset More than a month ago   Pain Frequency Constant   Multiple Pain Sites No                         [facility] Adult PT Treatment/Exercise - 09/05/16 0001      Self-Care   Self-Care Posture     Modalities   Modalities Electrical Stimulation;Moist Heat     Moist Heat Therapy   Number Minutes Moist Heat 15 Minutes   Moist Heat Location Cervical     Electrical Stimulation   Electrical Stimulation Location L shoulder and neck premod 80-150 Hz x 15 min to tolerance   Electrical Stimulation Goals Pain     Manual Therapy   Manual Therapy Soft tissue mobilization   Soft tissue mobilization to L UT/Lev scap/paraspinals/suboccipitals and QL          Trigger Point Dry Needling - 09/05/16 1520    Consent Given? Yes   Education Handout Provided No   Muscles Treated  Upper Body Upper trapezius;Oblique capitus;Suboccipitals muscle group;Levator scapulae;Quadratus Lumborum   Upper Trapezius Response Twitch reponse elicited;Palpable increased muscle length  L   Oblique Capitus Response Twitch response elicited;Palpable increased muscle length  L   SubOccipitals Response Twitch response elicited;Palpable increased muscle length  L   Levator Scapulae Response Twitch response elicited;Palpable increased muscle length  L              PT Education - 09/05/16 1519    Education provided Yes   Education Details HEP   Person(s) Educated Patient   Methods Explanation;Demonstration;Handout   Comprehension Verbalized understanding;Returned demonstration          PT Short Term Goals - 07/24/16 1817      PT SHORT TERM GOAL #1   Title STG's=LTG's.           PT Long Term Goals - 08/28/16 1537      PT LONG TERM GOAL #3   Title Sleep  6 hours undisturbed.   Baseline 3-4 hours (08/28/16)   Time 6   Period Weeks   Status On-going               Plan - 09/05/16 1523    Clinical Impression Statement Patient presents today with continued c/o L UT/neck pain. She had some relief with DN but pain persists. Patient continues to sit and stand with her head in R lateral flexion. PT explained consequences of holding L UT in constant stretch and provided stretches. Towel roll was also suggested around patient's neck to break her habit of R SB. Patient had good response to DN of L QL and longissimus as well as neck musculature. Goals are ongoing.   Rehab Potential Excellent   PT Frequency 2x / week   PT Duration 6 weeks   PT Treatment/Interventions ADLs/Self Care Home Management;Cryotherapy;Electrical Stimulation;Moist Heat;Ultrasound;Patient/family education;Therapeutic exercise;Therapeutic activities;Passive range of motion;Dry needling   PT Next Visit Plan GCODE 10TH VISIT; Assess DN; continue MFR to L QL; add spinal stabilization exercises.   PT Home Exercise Plan UT/lev scap stretches and QL stretch; cervical retraction   Consulted and Agree with Plan of Care Patient      Patient will benefit from skilled therapeutic intervention in order to improve the following deficits and impairments:  Pain, Decreased activity tolerance, Decreased range of motion  Visit Diagnosis: Acute pain of left shoulder     Problem List Patient Active Problem List   Diagnosis Date Noted  . History of colonic polyps 06/20/2016  . IBS (irritable bowel syndrome) 06/20/2016  . Abdominal pain, chronic, epigastric 06/20/2016  . BMI 40.0-44.9, adult ([place]) 11/22/2015  . Bilateral carpal tunnel syndrome 11/22/2015  . Tobacco abuse 11/22/2015  . Allergic rhinitis 11/01/2015  . GERD (gastroesophageal reflux disease) 11/01/2015  . LLQ pain 06/18/2015  . Bloating 06/18/2015  . Pain with urination 02/19/2015  . Moody 01/22/2015  . Pelvic pain in  female 01/13/2015  . Dysuria 01/13/2015  . Pelvic pressure in female 01/13/2015  . Vaginal pain 01/13/2015  . Hot flashes due to surgical menopause 01/13/2015  . Pre-diabetes 01/13/2015  . Hypertension 01/13/2015  . Migraines 01/13/2015  . Psoriasis 01/13/2015  . Arthritis 01/13/2015  . Fibromyalgia 01/13/2015  . COPD (chronic obstructive pulmonary disease) ([place]) 01/13/2015  . Hyperlipidemia 01/13/2015    Madelyn Flavors PT 09/05/2016, 3:39 PM  [facility] [address] [place], [place], [address] Phone: [phone]   Fax:  [phone]  Name: MADELYN STOBBE MRN: [id number] Date of  Birth: Apr 19, 1971

## 2016-09-05 NOTE — Patient Instructions (Signed)
   Lie on right side with a pillow or two under your waist and your back to edge of bed, top arm in front. Allow top leg to drape behind over edge. Hold 60 or more seconds.  Repeat 3 times per session. Do _2-3 sessions per day.    Flexibility: Upper Trapezius Stretch FOR RIGHT SIDE   Gently grasp right side of head while reaching behind back with other hand. Tilt head away until a gentle stretch is felt. Hold 30 seconds. Repeat 3 times per set. Do 2 sessions per day.  http://orth.exer.us/340   Levator Stretch   Grasp seat or sit on hand on side to be stretched. Turn head toward other side and look down. Use hand on head to gently stretch neck in that position. Hold _30___ seconds. Repeat on other side. Repeat 3 times. Do 2 sessions per day.  Madelyn Flavors, PT 09/05/16 3:19 PM [facility] [address] [place], [place], [address] Phone: [phone]   Fax:  [phone]

## 2016-09-11 ENCOUNTER — Ambulatory Visit: Payer: Medicare HMO | Admitting: Physical Therapy

## 2016-09-11 ENCOUNTER — Other Ambulatory Visit: Payer: Self-pay

## 2016-09-11 ENCOUNTER — Encounter (HOSPITAL_COMMUNITY)
Admission: RE | Admit: 2016-09-11 | Discharge: 2016-09-11 | Disposition: A | Discharge status: Home / Self Care | Payer: Medicare HMO | Source: Ambulatory Visit | Attending: Internal Medicine | Admitting: Internal Medicine

## 2016-09-11 ENCOUNTER — Encounter (HOSPITAL_COMMUNITY): Payer: Self-pay

## 2016-09-11 DIAGNOSIS — Z8719 Personal history of other diseases of the digestive system: Secondary | ICD-10-CM | POA: Diagnosis not present

## 2016-09-11 DIAGNOSIS — K2950 Unspecified chronic gastritis without bleeding: Secondary | ICD-10-CM | POA: Diagnosis not present

## 2016-09-11 DIAGNOSIS — Z01818 Encounter for other preprocedural examination: Secondary | ICD-10-CM | POA: Insufficient documentation

## 2016-09-11 DIAGNOSIS — K219 Gastro-esophageal reflux disease without esophagitis: Secondary | ICD-10-CM | POA: Diagnosis not present

## 2016-09-11 DIAGNOSIS — Z6839 Body mass index (BMI) 39.0-39.9, adult: Secondary | ICD-10-CM | POA: Diagnosis not present

## 2016-09-11 DIAGNOSIS — F17210 Nicotine dependence, cigarettes, uncomplicated: Secondary | ICD-10-CM | POA: Diagnosis not present

## 2016-09-11 DIAGNOSIS — F419 Anxiety disorder, unspecified: Secondary | ICD-10-CM | POA: Diagnosis not present

## 2016-09-11 DIAGNOSIS — M25512 Pain in left shoulder: Secondary | ICD-10-CM

## 2016-09-11 DIAGNOSIS — Z7951 Long term (current) use of inhaled steroids: Secondary | ICD-10-CM | POA: Diagnosis not present

## 2016-09-11 DIAGNOSIS — J449 Chronic obstructive pulmonary disease, unspecified: Secondary | ICD-10-CM | POA: Diagnosis not present

## 2016-09-11 DIAGNOSIS — Z79899 Other long term (current) drug therapy: Secondary | ICD-10-CM | POA: Diagnosis not present

## 2016-09-11 DIAGNOSIS — R1310 Dysphagia, unspecified: Secondary | ICD-10-CM | POA: Diagnosis not present

## 2016-09-11 DIAGNOSIS — E119 Type 2 diabetes mellitus without complications: Secondary | ICD-10-CM | POA: Diagnosis not present

## 2016-09-11 DIAGNOSIS — I10 Essential (primary) hypertension: Secondary | ICD-10-CM | POA: Diagnosis not present

## 2016-09-11 DIAGNOSIS — K582 Mixed irritable bowel syndrome: Secondary | ICD-10-CM | POA: Diagnosis not present

## 2016-09-11 DIAGNOSIS — R1012 Left upper quadrant pain: Secondary | ICD-10-CM | POA: Diagnosis not present

## 2016-09-11 HISTORY — DX: Anxiety disorder, unspecified: F41.9

## 2016-09-11 HISTORY — DX: Other intervertebral disc degeneration, lumbosacral region without mention of lumbar back pain or lower extremity pain: M51.379

## 2016-09-11 HISTORY — DX: Spondylolysis, site unspecified: M43.00

## 2016-09-11 HISTORY — DX: Other intervertebral disc degeneration, lumbosacral region: M51.37

## 2016-09-11 HISTORY — DX: Other intervertebral disc degeneration, lumbar region: M51.36

## 2016-09-11 NOTE — Patient Instructions (Signed)
Judy Evans  09/11/2016     @PREFPERIOPPHARMACY @   Your procedure is scheduled on  09/14/2016   Report to [facility] at  69  A.M.  Call this number if you have problems the morning of surgery:  [phone]   Remember:  Do not eat food or drink liquids after midnight.  Take these medicines the morning of surgery with A SIP OF WATER  Zyrtec, bentyl, cymbalta, neurontin, dilaudid, movantik, prilosec. Take your inhalers before you come and bring your rescue inhaler with you.   Do not wear jewelry, make-up or nail polish.  Do not wear lotions, powders, or perfumes, or deoderant.  Do not shave 48 hours prior to surgery.  Men may shave face and neck.  Do not bring valuables to the hospital.  [facility] is not responsible for any belongings or valuables.  Contacts, dentures or bridgework may not be worn into surgery.  Leave your suitcase in the car.  After surgery it may be brought to your room.  For patients admitted to the hospital, discharge time will be determined by your treatment team.  Patients discharged the day of surgery will not be allowed to drive home.   Name and phone number of your driver:   family Special instructions:  Follow the diet instructions given to you by Dr Roseanne Kaufman office.  Please read over the following fact sheets that you were given. Anesthesia Post-op Instructions and Care and Recovery After Surgery       Esophagogastroduodenoscopy Introduction Esophagogastroduodenoscopy (EGD) is a procedure to examine the lining of the esophagus, stomach, and first part of the small intestine (duodenum). This procedure is done to check for problems such as inflammation, bleeding, ulcers, or growths. During this procedure, a long, flexible, lighted tube with a camera attached (endoscope) is inserted down the throat. Tell a health care provider about:  Any allergies you have.  All medicines you are taking, including vitamins, herbs, eye  drops, creams, and over-the-counter medicines.  Any problems you or family members have had with anesthetic medicines.  Any blood disorders you have.  Any surgeries you have had.  Any medical conditions you have.  Whether you are pregnant or may be pregnant. What are the risks? Generally, this is a safe procedure. However, problems may occur, including:  Infection.  Bleeding.  A tear (perforation) in the esophagus, stomach, or duodenum.  Trouble breathing.  Excessive sweating.  Spasms of the larynx.  A slowed heartbeat.  Low blood pressure. What happens before the procedure?  Follow instructions from your health care provider about eating or drinking restrictions.  Ask your health care provider about:  Changing or stopping your regular medicines. This is especially important if you are taking diabetes medicines or blood thinners.  Taking medicines such as aspirin and ibuprofen. These medicines can thin your blood. Do not take these medicines before your procedure if your health care provider instructs you not to.  Plan to have someone take you home after the procedure.  If you wear dentures, be ready to remove them before the procedure. What happens during the procedure?  To reduce your risk of infection, your health care team will wash or sanitize their hands.  An IV tube will be put in a vein in your hand or arm. You will get medicines and fluids through this tube.  You will be given one or more of the following:  A medicine to help you  relax (sedative).  A medicine to numb the area (local anesthetic). This medicine may be sprayed into your throat. It will make you feel more comfortable and keep you from gagging or coughing during the procedure.  A medicine for pain.  A mouth guard may be placed in your mouth to protect your teeth and to keep you from biting on the endoscope.  You will be asked to lie on your left side.  The endoscope will be lowered down  your throat into your esophagus, stomach, and duodenum.  Air will be put into the endoscope. This will help your health care provider see better.  The lining of your esophagus, stomach, and duodenum will be examined.  Your health care provider may:  Take a tissue sample so it can be looked at in a lab (biopsy).  Remove growths.  Remove objects (foreign bodies) that are stuck.  Treat any bleeding with medicines or other devices that stop tissue from bleeding.  Widen (dilate) or stretch narrowed areas of your esophagus and stomach.  The endoscope will be taken out. The procedure may vary among health care providers and hospitals. What happens after the procedure?  Your blood pressure, heart rate, breathing rate, and blood oxygen level will be monitored often until the medicines you were given have worn off.  Do not eat or drink anything until the numbing medicine has worn off and your gag reflex has returned. This information is not intended to replace advice given to you by your health care provider. Make sure you discuss any questions you have with your health care provider. Document Released: 01/12/2005 Document Revised: 02/17/2016 Document Reviewed: 08/05/2015  2017 Elsevier Esophagogastroduodenoscopy, Care After Introduction Refer to this sheet in the next few weeks. These instructions provide you with information about caring for yourself after your procedure. Your health care provider may also give you more specific instructions. Your treatment has been planned according to current medical practices, but problems sometimes occur. Call your health care provider if you have any problems or questions after your procedure. What can I expect after the procedure? After the procedure, it is common to have:  A sore throat.  Nausea.  Bloating.  Dizziness.  Fatigue. Follow these instructions at home:  Do not eat or drink anything until the numbing medicine (local anesthetic)  has worn off and your gag reflex has returned. You will know that the local anesthetic has worn off when you can swallow comfortably.  Do not drive for 24 hours if you received a medicine to help you relax (sedative).  If your health care provider took a tissue sample for testing during the procedure, make sure to get your test results. This is your responsibility. Ask your health care provider or the department performing the test when your results will be ready.  Keep all follow-up visits as told by your health care provider. This is important. Contact a health care provider if:  You cannot stop coughing.  You are not urinating.  You are urinating less than usual. Get help right away if:  You have trouble swallowing.  You cannot eat or drink.  You have throat or chest pain that gets worse.  You are dizzy or light-headed.  You faint.  You have nausea or vomiting.  You have chills.  You have a fever.  You have severe abdominal pain.  You have black, tarry, or bloody stools. This information is not intended to replace advice given to you by your health care provider.  Make sure you discuss any questions you have with your health care provider. Document Released: 08/28/2012 Document Revised: 02/17/2016 Document Reviewed: 08/05/2015  2017 Elsevier  Monitored Anesthesia Care Anesthesia is a term that refers to techniques, procedures, and medicines that help a person stay safe and comfortable during a medical procedure. Monitored anesthesia care, or sedation, is one type of anesthesia. Your anesthesia specialist may recommend sedation if you will be having a procedure that does not require you to be unconscious, such as:  Cataract surgery.  A dental procedure.  A biopsy.  A colonoscopy. During the procedure, you may receive a medicine to help you relax (sedative). There are three levels of sedation:  Mild sedation. At this level, you may feel awake and relaxed. You will  be able to follow directions.  Moderate sedation. At this level, you will be sleepy. You may not remember the procedure.  Deep sedation. At this level, you will be asleep. You will not remember the procedure. The more medicine you are given, the deeper your level of sedation will be. Depending on how you respond to the procedure, the anesthesia specialist may change your level of sedation or the type of anesthesia to fit your needs. An anesthesia specialist will monitor you closely during the procedure. Let your health care provider know about:  Any allergies you have.  All medicines you are taking, including vitamins, herbs, eye drops, creams, and over-the-counter medicines.  Any use of steroids (by mouth or as a cream).  Any problems you or family members have had with sedatives and anesthetic medicines.  Any blood disorders you have.  Any surgeries you have had.  Any medical conditions you have, such as sleep apnea.  Whether you are pregnant or may be pregnant.  Any use of cigarettes, alcohol, or street drugs. What are the risks? Generally, this is a safe procedure. However, problems may occur, including:  Getting too much medicine (oversedation).  Nausea.  Allergic reaction to medicines.  Trouble breathing. If this happens, a breathing tube may be used to help with breathing. It will be removed when you are awake and breathing on your own.  Heart trouble.  Lung trouble. Before the procedure Staying hydrated  Follow instructions from your health care provider about hydration, which may include:  Up to 2 hours before the procedure - you may continue to drink clear liquids, such as water, clear fruit juice, black coffee, and plain tea. Eating and drinking restrictions  Follow instructions from your health care provider about eating and drinking, which may include:  8 hours before the procedure - stop eating heavy meals or foods such as meat, fried foods, or fatty  foods.  6 hours before the procedure - stop eating light meals or foods, such as toast or cereal.  6 hours before the procedure - stop drinking milk or drinks that contain milk.  2 hours before the procedure - stop drinking clear liquids. Medicines  Ask your health care provider about:  Changing or stopping your regular medicines. This is especially important if you are taking diabetes medicines or blood thinners.  Taking medicines such as aspirin and ibuprofen. These medicines can thin your blood. Do not take these medicines before your procedure if your health care provider instructs you not to. Tests and exams  You will have a physical exam.  You may have blood tests done to show:  How well your kidneys and liver are working.  How well your blood can clot.  General  instructions  Plan to have someone take you home from the hospital or clinic.  If you will be going home right after the procedure, plan to have someone with you for 24 hours. What happens during the procedure?  Your blood pressure, heart rate, breathing, level of pain and overall condition will be monitored.  An IV tube will be inserted into one of your veins.  Your anesthesia specialist will give you medicines as needed to keep you comfortable during the procedure. This may mean changing the level of sedation.  The procedure will be performed. After the procedure  Your blood pressure, heart rate, breathing rate, and blood oxygen level will be monitored until the medicines you were given have worn off.  Do not drive for 24 hours if you received a sedative.  You may:  Feel sleepy, clumsy, or nauseous.  Feel forgetful about what happened after the procedure.  Have a sore throat if you had a breathing tube during the procedure.  Vomit. This information is not intended to replace advice given to you by your health care provider. Make sure you discuss any questions you have with your health care  provider. Document Released: 06/07/2005 Document Revised: 02/18/2016 Document Reviewed: 01/02/2016 Elsevier Interactive Patient Education  2017 [place] POST-ANESTHESIA  IMMEDIATELY FOLLOWING SURGERY:  Do not drive or operate machinery for the first twenty four hours after surgery.  Do not make any important decisions for twenty four hours after surgery or while taking narcotic pain medications or sedatives.  If you develop intractable nausea and vomiting or a severe headache please notify your doctor immediately.  FOLLOW-UP:  Please make an appointment with your surgeon as instructed. You do not need to follow up with anesthesia unless specifically instructed to do so.  WOUND CARE INSTRUCTIONS (if applicable):  Keep a dry clean dressing on the anesthesia/puncture wound site if there is drainage.  Once the wound has quit draining you may leave it open to air.  Generally you should leave the bandage intact for twenty four hours unless there is drainage.  If the epidural site drains for more than 36-48 hours please call the anesthesia department.  QUESTIONS?:  Please feel free to call your physician or the hospital operator if you have any questions, and they will be happy to assist you.

## 2016-09-11 NOTE — Therapy (Addendum)
[facility] [place], [place], [address] Phone: [phone]   Fax:  [phone]  Physical Therapy Treatment  Patient Details  Name: Judy Evans MRN: [id number] Date of Birth: [birth year] Referring Provider: Gerrit Halls PA-C  Encounter Date: 09/11/2016      PT End of Session - 09/11/16 1434    Visit Number 10   Number of Visits 24   Date for PT Re-Evaluation 09/22/16   Authorization Type GCODE REQUIRED 20TH VISIT   PT Start Time 5956   PT Stop Time 1528   PT Time Calculation (min) 54 min   Activity Tolerance Patient tolerated treatment well   Behavior During Therapy [facility] for tasks assessed/performed      Past Medical History:  Diagnosis Date  . Anxiety   . Aphasia   . Arthritis   . COPD (chronic obstructive pulmonary disease) ([place])   . DDD (degenerative disc disease), cervical   . DDD (degenerative disc disease), lumbar   . Degenerative disc disease at L5-S1 level   . Diabetes mellitus without complication ([place])   . Fibromyalgia   . GERD (gastroesophageal reflux disease)   . H. pylori infection   . Hyperlipidemia   . Hypertension   . IBS (irritable bowel syndrome)   . LLQ pain 06/18/2015  . Migraines   . Moody 01/22/2015  . Pain with urination 02/19/2015  . Psoriasis   . Scoliosis   . Spondylolysis     Past Surgical History:  Procedure Laterality Date  . ABDOMINAL HYSTERECTOMY    . CARDIAC CATHETERIZATION  2015   patient reported it to be normal. "I was dying in pain during procedure".   . CARPAL TUNNEL RELEASE Right   . CESAREAN SECTION    . CHOLECYSTECTOMY    . COLONOSCOPY  06/2014   Altamese Dilling DeMason: normal  . DILATION AND CURETTAGE OF UTERUS    . ESOPHAGOGASTRODUODENOSCOPY  07/2014   Burke Keels: Normal  . FOOT SURGERY Bilateral    removal of heel spurs  . rotator cuff surgery Right   . TONSILLECTOMY      There were no vitals filed for this visit.      Subjective Assessment -  09/11/16 1434    Subjective Patient states she has no pain in her shoulder, but her hurts and her back is killing her. She reports she gets a headache in the back of her head everyday (since the summer), but gets worse after DN that evening.   Pertinent History Fibromyalgia.   Patient Stated Goals Get out of pain.   Currently in Pain? Yes   Pain Score 5    Pain Location Neck   Pain Orientation Upper;Mid  at foramen   Pain Descriptors / Indicators Constant  vice like   Pain Type Chronic pain   Pain Onset More than a month ago   Pain Frequency Constant                         [facility] Adult PT Treatment/Exercise - 09/11/16 0001      Modalities   Modalities Electrical Stimulation;Moist Heat;Ultrasound     Moist Heat Therapy   Number Minutes Moist Heat 15 Minutes   Moist Heat Location Cervical     Electrical Stimulation   Electrical Stimulation Location B semispinalis cervicis premod 80-150 Hz x 15 min   Electrical Stimulation Goals Pain     Ultrasound   Ultrasound Location B semispinalis cervicis   Ultrasound  Parameters 1.5 w/cm2 1 mhz cont x 15 min   Ultrasound Goals Pain     Manual Therapy   Manual Therapy Soft tissue mobilization   Soft tissue mobilization To B cervical, thoracic and lumbar paraspinals.                  PT Short Term Goals - 07/24/16 1817      PT SHORT TERM GOAL #1   Title STG's=LTG's.           PT Long Term Goals - 04-Oct-2016 1534      PT LONG TERM GOAL #1   Title Independent with a HEP.   Time 6   Period Weeks   Status Achieved     PT LONG TERM GOAL #2   Title Increase left shoulder ER to 85 degrees.   Baseline 77 deg 2016/10/04   Time 6   Period Weeks   Status On-going     PT LONG TERM GOAL #3   Title Sleep 6 hours undisturbed.   Baseline 3-4 hours (08/28/16)   Time 6   Period Weeks   Status On-going     PT LONG TERM GOAL #4   Title Perform ADL's with pain not > 2-3/10.   Baseline 3-4/10 as of 10/04/16    Time 6   Period Weeks   Status Partially Met               Plan - 2016/10/04 1524    Clinical Impression Statement Patient presents today with no c/o shoulder pain, but c/o pain in post head and neck near foramen magnum. She also reports B LBP. Patient reports increased pain after dry needling, possibly from increased blood flow in suboccipital area. She also reports increased pain when she lies supine or bends over and reports dizziness as well. She plans to f/u with MD regarding these sx. She demonstrated no significant tighness in muscles today but was hypersensitive to manual therapy at times. Goals are progressing.   Rehab Potential Excellent   PT Frequency 2x / week   PT Duration 6 weeks   PT Treatment/Interventions ADLs/Self Care Home Management;Cryotherapy;Electrical Stimulation;Moist Heat;Ultrasound;Patient/family education;Therapeutic exercise;Therapeutic activities;Passive range of motion;Dry needling   PT Next Visit Plan continue MFR to post neck and paraspinals; add spinal stabilization exercises if tolerated.   PT Home Exercise Plan UT/lev scap stretches and QL stretch; cervical retraction   Consulted and Agree with Plan of Care Patient      Patient will benefit from skilled therapeutic intervention in order to improve the following deficits and impairments:  Pain, Decreased activity tolerance, Decreased range of motion  Visit Diagnosis: Acute pain of left shoulder       G-Codes - 10-04-2016 1532    Functional Assessment Tool Used Clinical judgement.....   Functional Limitation Carrying, moving and handling objects   Carrying, Moving and Handling Objects Current Status [phone]) At least 20 percent but less than 40 percent impaired, limited or restricted   Carrying, Moving and Handling Objects Goal Status (S3419) At least 1 percent but less than 20 percent impaired, limited or restricted      Problem List Patient Active Problem List   Diagnosis Date Noted  .  History of colonic polyps 06/20/2016  . IBS (irritable bowel syndrome) 06/20/2016  . Abdominal pain, chronic, epigastric 06/20/2016  . BMI 40.0-44.9, adult ([place]) 11/22/2015  . Bilateral carpal tunnel syndrome 11/22/2015  . Tobacco abuse 11/22/2015  . Allergic rhinitis 11/01/2015  . GERD (gastroesophageal reflux disease)  11/01/2015  . LLQ pain 06/18/2015  . Bloating 06/18/2015  . Pain with urination 02/19/2015  . Moody 01/22/2015  . Pelvic pain in female 01/13/2015  . Dysuria 01/13/2015  . Pelvic pressure in female 01/13/2015  . Vaginal pain 01/13/2015  . Hot flashes due to surgical menopause 01/13/2015  . Pre-diabetes 01/13/2015  . Hypertension 01/13/2015  . Migraines 01/13/2015  . Psoriasis 01/13/2015  . Arthritis 01/13/2015  . Fibromyalgia 01/13/2015  . COPD (chronic obstructive pulmonary disease) ([place]) 01/13/2015  . Hyperlipidemia 01/13/2015    Madelyn Flavors PT 09/11/2016, 3:40 PM  [facility] [address] [place], [place], [address] Phone: [phone]   Fax:  [phone]  Name: TAVARIA MACKINS MRN: [id number] Date of Birth: [birth year]   PHYSICAL THERAPY DISCHARGE SUMMARY  Visits from Start of Care: 10.  Current functional level related to goals / functional outcomes: See above.   Remaining deficits: See LTG   Education / Equipment: HEP. Plan: Patient agrees to discharge.  Patient goals were partially met. Patient is being discharged due to                                                     ?????          Mali Applegate MPT

## 2016-09-14 ENCOUNTER — Ambulatory Visit (HOSPITAL_COMMUNITY): Payer: Medicare HMO | Admitting: Anesthesiology

## 2016-09-14 ENCOUNTER — Encounter (HOSPITAL_COMMUNITY): Payer: Self-pay

## 2016-09-14 ENCOUNTER — Encounter (HOSPITAL_COMMUNITY)
Admission: RE | Disposition: A | Discharge status: Home / Self Care | Payer: Self-pay | Source: Ambulatory Visit | Attending: Internal Medicine

## 2016-09-14 ENCOUNTER — Ambulatory Visit (HOSPITAL_COMMUNITY)
Admission: RE | Admit: 2016-09-14 | Discharge: 2016-09-14 | Disposition: A | Discharge status: Home / Self Care | Payer: Medicare HMO | Source: Ambulatory Visit | Attending: Internal Medicine | Admitting: Internal Medicine

## 2016-09-14 DIAGNOSIS — E119 Type 2 diabetes mellitus without complications: Secondary | ICD-10-CM | POA: Diagnosis not present

## 2016-09-14 DIAGNOSIS — Z7951 Long term (current) use of inhaled steroids: Secondary | ICD-10-CM | POA: Diagnosis not present

## 2016-09-14 DIAGNOSIS — K3189 Other diseases of stomach and duodenum: Secondary | ICD-10-CM | POA: Diagnosis not present

## 2016-09-14 DIAGNOSIS — R1013 Epigastric pain: Secondary | ICD-10-CM | POA: Diagnosis not present

## 2016-09-14 DIAGNOSIS — Z8719 Personal history of other diseases of the digestive system: Secondary | ICD-10-CM | POA: Diagnosis not present

## 2016-09-14 DIAGNOSIS — R1012 Left upper quadrant pain: Secondary | ICD-10-CM | POA: Diagnosis not present

## 2016-09-14 DIAGNOSIS — K219 Gastro-esophageal reflux disease without esophagitis: Secondary | ICD-10-CM | POA: Diagnosis not present

## 2016-09-14 DIAGNOSIS — J449 Chronic obstructive pulmonary disease, unspecified: Secondary | ICD-10-CM | POA: Diagnosis not present

## 2016-09-14 DIAGNOSIS — F17210 Nicotine dependence, cigarettes, uncomplicated: Secondary | ICD-10-CM | POA: Insufficient documentation

## 2016-09-14 DIAGNOSIS — I10 Essential (primary) hypertension: Secondary | ICD-10-CM | POA: Diagnosis not present

## 2016-09-14 DIAGNOSIS — F419 Anxiety disorder, unspecified: Secondary | ICD-10-CM | POA: Insufficient documentation

## 2016-09-14 DIAGNOSIS — R1310 Dysphagia, unspecified: Secondary | ICD-10-CM | POA: Insufficient documentation

## 2016-09-14 DIAGNOSIS — K582 Mixed irritable bowel syndrome: Secondary | ICD-10-CM | POA: Diagnosis not present

## 2016-09-14 DIAGNOSIS — Z79899 Other long term (current) drug therapy: Secondary | ICD-10-CM | POA: Insufficient documentation

## 2016-09-14 DIAGNOSIS — K2950 Unspecified chronic gastritis without bleeding: Secondary | ICD-10-CM | POA: Diagnosis not present

## 2016-09-14 DIAGNOSIS — Z6839 Body mass index (BMI) 39.0-39.9, adult: Secondary | ICD-10-CM | POA: Insufficient documentation

## 2016-09-14 HISTORY — PX: BIOPSY: SHX5522

## 2016-09-14 HISTORY — PX: ESOPHAGOGASTRODUODENOSCOPY (EGD) WITH PROPOFOL: SHX5813

## 2016-09-14 HISTORY — PX: MALONEY DILATION: SHX5535

## 2016-09-14 SURGERY — ESOPHAGOGASTRODUODENOSCOPY (EGD) WITH PROPOFOL
Anesthesia: Monitor Anesthesia Care

## 2016-09-14 MED ORDER — IPRATROPIUM-ALBUTEROL 0.5-2.5 (3) MG/3ML IN SOLN
3.0000 mL | Freq: Four times a day (QID) | RESPIRATORY_TRACT | Status: DC
  Administered 2016-09-14: 3 mL via RESPIRATORY_TRACT

## 2016-09-14 MED ORDER — LACTATED RINGERS IV SOLN
INTRAVENOUS | Status: DC
  Administered 2016-09-14: 08:00:00 via INTRAVENOUS

## 2016-09-14 MED ORDER — LIDOCAINE HCL 2 % IJ SOLN
0.1000 mL | Freq: Once | INTRAMUSCULAR | Status: DC
  Filled 2016-09-14: qty 10

## 2016-09-14 MED ORDER — MIDAZOLAM HCL 2 MG/2ML IJ SOLN
INTRAMUSCULAR | Status: AC
  Filled 2016-09-14: qty 2

## 2016-09-14 MED ORDER — PROPOFOL 10 MG/ML IV BOLUS
INTRAVENOUS | Status: AC
  Filled 2016-09-14: qty 40

## 2016-09-14 MED ORDER — CHLORHEXIDINE GLUCONATE CLOTH 2 % EX PADS: 6.0000 | MEDICATED_PAD | Freq: Once | CUTANEOUS | Status: DC

## 2016-09-14 MED ORDER — FENTANYL CITRATE (PF) 100 MCG/2ML IJ SOLN
25.0000 ug | INTRAMUSCULAR | Status: AC | PRN
  Administered 2016-09-14 (×2): 25 ug via INTRAVENOUS

## 2016-09-14 MED ORDER — SUCCINYLCHOLINE CHLORIDE 20 MG/ML IJ SOLN
INTRAMUSCULAR | Status: AC
  Filled 2016-09-14: qty 1

## 2016-09-14 MED ORDER — FENTANYL CITRATE (PF) 100 MCG/2ML IJ SOLN
INTRAMUSCULAR | Status: AC
  Filled 2016-09-14: qty 2

## 2016-09-14 MED ORDER — LACTATED RINGERS IV SOLN
INTRAVENOUS | Status: DC | PRN
  Administered 2016-09-14: 08:00:00 via INTRAVENOUS

## 2016-09-14 MED ORDER — LIDOCAINE HCL (PF) 1 % IJ SOLN
INTRAMUSCULAR | Status: AC
  Filled 2016-09-14: qty 5

## 2016-09-14 MED ORDER — MIDAZOLAM HCL 5 MG/5ML IJ SOLN
INTRAMUSCULAR | Status: DC | PRN
  Administered 2016-09-14: 2 mg via INTRAVENOUS

## 2016-09-14 MED ORDER — IPRATROPIUM-ALBUTEROL 0.5-2.5 (3) MG/3ML IN SOLN
RESPIRATORY_TRACT | Status: AC
  Filled 2016-09-14: qty 3

## 2016-09-14 MED ORDER — PROPOFOL 500 MG/50ML IV EMUL
INTRAVENOUS | Status: DC | PRN
  Administered 2016-09-14: 150 ug/kg/min via INTRAVENOUS

## 2016-09-14 MED ORDER — LIDOCAINE HCL 2 % EX GEL
1.0000 "application " | Freq: Once | CUTANEOUS | Status: AC
  Administered 2016-09-14: 1 via TOPICAL

## 2016-09-14 MED ORDER — LIDOCAINE HCL (CARDIAC) 10 MG/ML IV SOLN
INTRAVENOUS | Status: DC | PRN
  Administered 2016-09-14: 50 mg via INTRAVENOUS

## 2016-09-14 MED ORDER — MIDAZOLAM HCL 2 MG/2ML IJ SOLN
1.0000 mg | INTRAMUSCULAR | Status: DC | PRN
  Administered 2016-09-14: 2 mg via INTRAVENOUS

## 2016-09-14 NOTE — Anesthesia Procedure Notes (Signed)
Procedure Name: MAC Date/Time: 09/14/2016 8:41 AM Performed by: Andree Elk, AMY A Pre-anesthesia Checklist: Patient identified, Emergency Drugs available, Suction available, Patient being monitored and Timeout performed Oxygen Delivery Method: Simple face mask

## 2016-09-14 NOTE — Op Note (Signed)
[facility] Patient Name: Judy Evans Procedure Date: 09/14/2016 8:15 AM MRN: [id number] Date of Birth: [birth year] Attending MD: Norvel Richards , MD CSN: [id number] Age: 45 Admit Type: Outpatient Procedure:                Upper GI endoscopy with bx and maloney dilation Indications:              Dysphagia Providers:                Norvel Richards, MD, Jeanann Lewandowsky. Sharon Seller, RN,                            Isabella Stalling, Technician Referring MD:              Medicines:                Propofol per Anesthesia Complications:            No immediate complications. Estimated Blood Loss:     Estimated blood loss was minimal. Procedure:                Pre-Anesthesia Assessment:                           - Prior to the procedure, a History and Physical                            was performed, and patient medications and                            allergies were reviewed. The patient's tolerance of                            previous anesthesia was also reviewed. The risks                            and benefits of the procedure and the sedation                            options and risks were discussed with the patient.                            All questions were answered, and informed consent                            was obtained. Prior Anticoagulants: The patient has                            taken no previous anticoagulant or antiplatelet                            agents. ASA Grade Assessment: II - A patient with                            mild systemic disease. After reviewing the risks  and benefits, the patient was deemed in                            satisfactory condition to undergo the procedure.                           After obtaining informed consent, the endoscope was                            passed under direct vision. Throughout the                            procedure, the patient's blood pressure, pulse, and                oxygen saturations were monitored continuously. The                            [id number]) scope was introduced through the                            and advanced to the second part of duodenum. The                            upper GI endoscopy was accomplished without                            difficulty. The patient tolerated the procedure                            well. The patient tolerated the procedure well. Scope In: 8:52:07 AM Scope Out: 8:59:18 AM Total Procedure Duration: 0 hours 7 minutes 11 seconds  Findings:      The examined esophagus was normal. The scope was withdrawn.      Multiple erosions were found in the gastric antrum. Stomach was       otherwise normal. .      The duodenal bulb and second portion of the duodenum were normal.       Dilation was performed with a Maloney dilator with no resistance at 37       Fr. The dilation site was examined following endoscope reinsertion and       showed no change. Estimated blood loss was minimal. This was biopsied       with a cold forceps for histology. Estimated blood loss was minimal Impression:               - Normal esophagus. Dilated.                           - Erosive gastropathy. Biopsied.                           - Normal duodenal bulb and second portion of the                            duodenum. Moderate Sedation:      Moderate (conscious) sedation was personally administered by an  anesthesia professional. The following parameters were monitored: oxygen       saturation, heart rate, blood pressure, respiratory rate, EKG, adequacy       of pulmonary ventilation, and response to care. Total physician       intraservice time was 15 minutes. Recommendation:           - Patient has a contact number available for                            emergencies. The signs and symptoms of potential                            delayed complications were discussed with the                             patient. Return to normal activities tomorrow.                            Written discharge instructions were provided to the                            patient.                           - Resume previous diet. Increase omeprazole to 40                            mg daily. Continue movantik 25 mg daily. Office                            with [place] in 3 months                           - Continue present medications. Procedure Code(s):        --- Professional ---                           [phone], Esophagogastroduodenoscopy, flexible,                            transoral; with biopsy, single or multiple Diagnosis Code(s):        --- Professional ---                           K31.89, Other diseases of stomach and duodenum                           R13.10, Dysphagia, unspecified CPT copyright 2016 American Medical Association. All rights reserved. The codes documented in this report are preliminary and upon coder review may  be revised to meet current compliance requirements. Cristopher Estimable. Rourk, MD Norvel Richards, MD 09/14/2016 9:12:12 AM This report has been signed electronically. Number of Addenda: 0

## 2016-09-14 NOTE — Discharge Instructions (Addendum)
ncrease omeprazole to 40 mg daily  Continue Movantik 25 mg daily  Further recommendations to follow pending review of pathology report  Office visit with [place] in 3 weeks. Neil Crouch 10/06/2016 at 9:00   EGD Discharge instructions Please read the instructions outlined below and refer to this sheet in the next few weeks. These discharge instructions provide you with general information on caring for yourself after you leave the hospital. Your doctor may also give you specific instructions. While your treatment has been planned according to the most current medical practices available, unavoidable complications occasionally occur. If you have any problems or questions after discharge, please call your doctor. ACTIVITY  You may resume your regular activity but move at a slower pace for the next 24 hours.   Take frequent rest periods for the next 24 hours.   Walking will help expel (get rid of) the air and reduce the bloated feeling in your abdomen.   No driving for 24 hours (because of the anesthesia (medicine) used during the test).   You may shower.   Do not sign any important legal documents or operate any machinery for 24 hours (because of the anesthesia used during the test).  NUTRITION  Drink plenty of fluids.   You may resume your normal diet.   Begin with a light meal and progress to your normal diet.   Avoid alcoholic beverages for 24 hours or as instructed by your caregiver.  MEDICATIONS  You may resume your normal medications unless your caregiver tells you otherwise.  WHAT YOU CAN EXPECT TODAY  You may experience abdominal discomfort such as a feeling of fullness or gas pains.  FOLLOW-UP  Your doctor will discuss the results of your test with you.  SEEK IMMEDIATE MEDICAL ATTENTION IF ANY OF THE FOLLOWING OCCUR:  Excessive nausea (feeling sick to your stomach) and/or vomiting.   Severe abdominal pain and distention (swelling).   Trouble swallowing.    Temperature over 101 F (37.8 C).   Rectal bleeding or vomiting of blood.

## 2016-09-14 NOTE — Anesthesia Preprocedure Evaluation (Signed)
Anesthesia Evaluation  Patient identified by MRN, date of birth, ID band Patient awake    Reviewed: Allergy & Precautions, NPO status , Patient's Chart, lab work & pertinent test results  Airway Mallampati: I  TM Distance: >3 FB     Dental  (+) Edentulous Upper, Edentulous Lower   Pulmonary COPD, Current Smoker,    breath sounds clear to auscultation       Cardiovascular hypertension,  Rhythm:Regular Rate:Normal     Neuro/Psych  Headaches, PSYCHIATRIC DISORDERS Anxiety  Neuromuscular disease    GI/Hepatic GERD  ,  Endo/Other  diabetesMorbid obesity  Renal/GU      Musculoskeletal  (+) Fibromyalgia -  Abdominal   Peds  Hematology   Anesthesia Other Findings   Reproductive/Obstetrics                             Anesthesia Physical Anesthesia Plan  ASA: III  Anesthesia Plan: MAC   Post-op Pain Management:    Induction: Intravenous  Airway Management Planned: Simple Face Mask  Additional Equipment:   Intra-op Plan:   Post-operative Plan:   Informed Consent: I have reviewed the patients History and Physical, chart, labs and discussed the procedure including the risks, benefits and alternatives for the proposed anesthesia with the patient or authorized representative who has indicated his/her understanding and acceptance.     Plan Discussed with:   Anesthesia Plan Comments:         Anesthesia Quick Evaluation

## 2016-09-14 NOTE — Anesthesia Postprocedure Evaluation (Signed)
Anesthesia Post Note  Patient: Judy Evans  Procedure(s) Performed: Procedure(s) (LRB): ESOPHAGOGASTRODUODENOSCOPY (EGD) WITH PROPOFOL (N/A) [name]  Patient location during evaluation: PACU Anesthesia Type: MAC Level of consciousness: awake and alert and oriented Pain management: pain level controlled Vital Signs Assessment: post-procedure vital signs reviewed and stable Respiratory status: spontaneous breathing and patient connected to nasal cannula oxygen Cardiovascular status: stable Postop Assessment: no signs of nausea or vomiting Anesthetic complications: no     Last Vitals:  Vitals:   09/14/16 0825 09/14/16 0830  BP: 120/64 123/69  Pulse:    Resp: 15 (!) 30  Temp:      Last Pain:  Vitals:   09/14/16 0846  TempSrc:   PainSc: 10-Worst pain ever                 ADAMS, AMY A

## 2016-09-14 NOTE — H&P (View-Only) (Signed)
Primary Care Physician:  Eustaquio Maize, MD Primary Gastroenterologist:  Dr. Gala Romney  Pre-Procedure History & Physical: HPI:  Judy Evans is a 45 y.o. female here for follow-up of multiple GI complaints. History of GERD epigastric/ left upper quadrant abdominal pain alternating constipation and diarrhea. Evaluated here recently. Course of Bentyl did not help her symptoms. She is nauseated nearly all the time - does not vomit. Workup for infectious causes of diarrhea negative with prior stool studies.  Takes 8 mg Dilaudid daily per routine. Vague dysphagia to solids. No systemic nonsteroidal; she does use Voltaren gel. No melena or hematochezia.  Took some Citrucel since she was last seen here -  made no difference. Nauseated a good bit of the time.  She does report breakthrough reflux symptoms weekly on omeprazole 20 mg daily. She's gained 2 pounds since her last visit. Followed closely by Dr. Francesco Runner with pain management.  Reported negative EGD and colonoscopy in [place], [place] -  due for surveillance colonoscopy 2020 - history of polyps.  Since last being seen by her TTG IgA and total IgA came back within normal limits.    Past Medical History:  Diagnosis Date  . Aphasia   . Arthritis   . COPD (chronic obstructive pulmonary disease) ([place])   . DDD (degenerative disc disease), cervical   . DDD (degenerative disc disease), lumbar   . Diabetes mellitus without complication ([place])   . Fibromyalgia   . GERD (gastroesophageal reflux disease)   . H. pylori infection   . Hyperlipidemia   . Hypertension   . IBS (irritable bowel syndrome)   . LLQ pain 06/18/2015  . Migraines   . [place] 01/22/2015  . Pain with urination 02/19/2015  . Psoriasis   . Scoliosis     Past Surgical History:  Procedure Laterality Date  . ABDOMINAL HYSTERECTOMY    . CARDIAC CATHETERIZATION  2015   patient reported it to be normal. "I was dying in pain during procedure".   . CARPAL TUNNEL RELEASE    . CESAREAN  SECTION    . CHOLECYSTECTOMY    . COLONOSCOPY  06/2014   Burke Keels: normal  . ESOPHAGOGASTRODUODENOSCOPY  07/2014   Burke Keels: Normal  . FOOT SURGERY Bilateral   . rotator cuff surgery    . TONSILLECTOMY      Prior to Admission medications   Medication Sig Start Date End Date Taking? Authorizing Provider  albuterol (VENTOLIN HFA) 108 (90 Base) MCG/ACT inhaler INHALE 2 PUFFS INTO THE LUNGS EVERY 4 (FOUR) HOURS AS NEEDED FOR WHEEZING OR SHORTNESS OF BREATH. 04/20/16  Yes Eustaquio Maize, MD  cetirizine (ZYRTEC) 10 MG tablet TAKE 1 TABLET (10 MG TOTAL) BY MOUTH DAILY. 01/25/16  Yes Eustaquio Maize, MD  diclofenac sodium (VOLTAREN) 1 % GEL Apply 4 g topically 4 (four) times daily. 04/20/16  Yes Eustaquio Maize, MD  dicyclomine (BENTYL) 10 MG capsule 1-2 capsule before meals and at bedtime as needed for abdominal pain and diarrhea. Hold for constipation. 06/20/16  Yes Mahala Menghini, PA-C  DULoxetine (CYMBALTA) 30 MG capsule  06/12/16  Yes Historical Provider, MD  fluticasone (FLONASE) 50 MCG/ACT nasal spray Place 2 sprays into both nostrils daily. 07/25/16  Yes Sharion Balloon, FNP  gabapentin (NEURONTIN) 800 MG tablet Take 800 mg by mouth 4 (four) times daily.   Yes Historical Provider, MD  HYDROmorphone (DILAUDID) 2 MG tablet Take 2 mg by mouth every 6 (six) hours as needed. 12/06/15  Yes  Historical Provider, MD  lidocaine (LIDODERM) 5 % PLACE 1 PATCH ONTO THE SKIN DAILY. REMOVE & DISCARD PATCH WITHIN 12 HOURS OR AS DIRECTED BY MD 07/11/16  Yes Eustaquio Maize, MD  omeprazole (PRILOSEC) 20 MG capsule TAKE 1 CAPSULE (20 MG TOTAL) BY MOUTH DAILY. 05/30/16  Yes Eustaquio Maize, MD  SYMBICORT 80-4.5 MCG/ACT inhaler  07/22/16  Yes Historical Provider, MD  doxycycline (VIBRA-TABS) 100 MG tablet Take 1 tablet (100 mg total) by mouth 2 (two) times daily. Patient not taking: Reported on 08/22/2016 07/25/16   Sharion Balloon, FNP    Allergies as of 08/22/2016 - Review Complete 08/22/2016  Allergen  Reaction Noted  . Rosuvastatin calcium Swelling 11/25/2014  . Statins Swelling 11/25/2014  . Hctz [hydrochlorothiazide] Nausea And Vomiting and Rash 01/12/2014  . Hydrocodone-acetaminophen Rash 04/13/2016  . Oxycodone Rash 04/13/2016  . Penicillins Nausea And Vomiting and Rash 01/12/2014    Family History  Problem Relation Age of Onset  . COPD Mother   . Diabetes Mother   . Osteoarthritis Mother   . Healthy Father   . Lupus Sister   . CAD Sister   . Diabetes Sister   . Early death Brother     pneumonia  . Asthma Daughter   . Asthma Son   . Cancer Maternal Grandmother 58    colon  . Arthritis Maternal Grandmother   . Diabetes Maternal Grandmother   . Dementia Maternal Grandmother   . Other Maternal Grandfather     brain tumor  . Cancer Paternal Grandmother     breast  . Cancer Paternal Grandfather     lung  . Factor V Leiden deficiency Sister     Social History   Social History  . Marital status: Married    Spouse name: N/A  . Number of children: N/A  . Years of education: N/A   Occupational History  . Not on file.   Social History Main Topics  . Smoking status: Current Every Day Smoker    Packs/day: 1.50    Years: 30.00    Types: Cigarettes  . Smokeless tobacco: Never Used  . Alcohol use No  . Drug use: No  . Sexual activity: Not Currently    Birth control/ protection: Surgical     Comment: hyst   Other Topics Concern  . Not on file   Social History Narrative  . No narrative on file    Review of Systems: See HPI, otherwise negative ROS  Physical Exam: BP 134/80   Pulse 69   Temp 97.8 F (36.6 C) (Oral)   Ht 5\' 2"  (1.575 m)   Wt 218 lb (98.9 kg)   BMI 39.87 kg/m  General:   pleasant and cooperative in NAD.  Obese. Skin:  Intact without significant lesions or rashes.. Multiple tattoos. Eyes:  Sclera clear, no icterus.   Conjunctiva pink. Ears:  Normal auditory acuity. Nose:  No deformity, discharge,  or lesions. Mouth:  No deformity or  lesions. Neck:  Supple; no masses or thyromegaly. No significant cervical adenopathy. Lungs:  Clear throughout to auscultation.   No wheezes, crackles, or rhonchi. No acute distress. Heart:  Regular rate and rhythm; no murmurs, clicks, rubs,  or gallops. Abdomen: Obese. Non-distended, normal bowel sounds.  Soft and nontender without appreciable mass or hepatosplenomegaly.  Rectal: No mass in the rectal vault. Scant brown stool in the rectal vault Hemoccult negative Pulses:  Normal pulses noted. Extremities:  Without clubbing or edema.  Impression:  45 year old lady  on heavy duty daily narcotic therapy presents with a vague symptoms of epigastric left upper quadrant abdominal pain alternating constipation and diarrhea. Vague dysphagia and breakthrough reflux symptoms.  No fecal impaction on exam today. She perceives when she has a bowel movement she empties out well. Course of Bentyl did not help.  Need to rule out ongoing pathology in her upper GI tract. Dysphagia needs further evaluation. She could be having an element of spurious diarrhea in the setting of opioid-induced constipation.  An underlying motility disorder of the GI tract may be contributing to her symptoms.  An element of opioid-induced hyperalgesia likely inter-woven into the clinical picture.  Recommendations:  I have offered the patient a diagnostic EGD in the near future to further evaluate her symptoms.  The risks, benefits, limitations, alternatives and imponderables have been reviewed with the patient. Potential for esophageal dilation, biopsy, etc. have also been reviewed.  Questions have been answered. All parties agreeable. We'll utilize deep sedation with anesthesia.  KUB now to assess stool load in her colon.  Further recommendations to follow.         Notice: This dictation was prepared with Dragon dictation along with smaller phrase technology. Any transcriptional errors that result from this process are  unintentional and may not be corrected upon review.

## 2016-09-14 NOTE — Interval H&P Note (Signed)
History and Physical Interval Note:  09/14/2016 8:24 AM  Judy Evans  has presented today for surgery, with the diagnosis of EPIGASTRIC PAIN/DYSPHGIA  The various methods of treatment have been discussed with the patient and family. After consideration of risks, benefits and other options for treatment, the patient has consented to  Procedure(s) with comments: ESOPHAGOGASTRODUODENOSCOPY (EGD) WITH PROPOFOL (N/A) - 830  as a surgical intervention .  The patient's history has been reviewed, patient examined, no change in status, stable for surgery.  I have reviewed the patient's chart and labs.  Questions were answered to the patient's satisfaction.     Manus Rudd   Patient states Movantik 25 mg has helped  abdominal pain and constipation significantly. Otherwise, no change. EGD with ED per plan.  The risks, benefits, limitations, alternatives and imponderables have been reviewed with the patient. Potential for esophageal dilation, biopsy, etc. have also been reviewed.  Questions have been answered. All parties agreeable.

## 2016-09-14 NOTE — Transfer of Care (Signed)
Immediate Anesthesia Transfer of Care Note  Patient: Judy Evans  Procedure(s) Performed: Procedure(s) with comments: ESOPHAGOGASTRODUODENOSCOPY (EGD) WITH PROPOFOL (N/A) - [place] - gastric   Patient Location: PACU  Anesthesia Type:MAC  Level of Consciousness: awake, oriented and patient cooperative  Airway & Oxygen Therapy: Patient Spontanous Breathing and Patient connected to nasal cannula oxygen  Post-op Assessment: Report given to RN and Post -op Vital signs reviewed and stable  Post vital signs: Reviewed and stable  Last Vitals:  Vitals:   09/14/16 0825 09/14/16 0830  BP: 120/64 123/69  Pulse:    Resp: 15 (!) 30  Temp:      Last Pain:  Vitals:   09/14/16 0846  TempSrc:   PainSc: 10-Worst pain ever      Patients Stated Pain Goal: 8 ([id number])  Complications: No apparent anesthesia complications

## 2016-09-15 ENCOUNTER — Ambulatory Visit (INDEPENDENT_AMBULATORY_CARE_PROVIDER_SITE_OTHER): Payer: Medicare HMO | Admitting: Pediatrics

## 2016-09-15 ENCOUNTER — Encounter: Payer: Self-pay | Admitting: Pediatrics

## 2016-09-15 ENCOUNTER — Encounter (INDEPENDENT_AMBULATORY_CARE_PROVIDER_SITE_OTHER): Payer: Self-pay

## 2016-09-15 VITALS — BP 122/79 | HR 71 | Temp 97.8°F | Ht 62.0 in | Wt 217.6 lb

## 2016-09-15 DIAGNOSIS — M545 Low back pain, unspecified: Secondary | ICD-10-CM

## 2016-09-15 DIAGNOSIS — R5383 Other fatigue: Secondary | ICD-10-CM | POA: Diagnosis not present

## 2016-09-15 DIAGNOSIS — R202 Paresthesia of skin: Secondary | ICD-10-CM

## 2016-09-15 MED ORDER — IBUPROFEN 600 MG PO TABS: 600.0000 mg | ORAL_TABLET | Freq: Three times a day (TID) | ORAL | 0 refills | Status: DC | PRN

## 2016-09-15 NOTE — Progress Notes (Signed)
  Subjective:   Patient ID: Judy Evans, female    DOB: May 29, 1971, 45 y.o.   MRN: [id number] CC: Back Pain (Lower)  HPI: Judy Evans is a 45 y.o. female presenting for Back Pain (Lower)  Has bulging discs in neck Followed by orthopedics for shoulder pain, thought to be partly from neck pain Shoulder pain now better Has been getting dry needling in in neck for neck pain Started having more migraines Now on amitriptiline 25mg  by Dr. Illene Regulus it is helping some with migraines Continues to have headaches though, L sided, sometimes back of head  Three days ago got deep tissue massage b/l paraspinal muscles, goes across lower back Has had nerve burning in lower back in the past Past few days can barely get out of bed Eases up some throughout the day  H/o heavy alcohol use, minima use now  Past couple of months tingling present in LE from knee down Last MRI lumbar spine 06/2015 with facet degeneration L4-5 and L5-S1 and disc bulge L4-5 with no herniation  Relevant past medical, surgical, family and social history reviewed. Allergies and medications reviewed and updated. History  Smoking Status  . Current Every Day Smoker  . Packs/day: 1.50  . Years: 30.00  . Types: Cigarettes  Smokeless Tobacco  . Never Used   ROS: Per HPI   Objective:    BP 122/79   Pulse 71   Temp 97.8 F (36.6 C) (Oral)   Ht 5\' 2"  (1.575 m)   Wt 217 lb 9.6 oz (98.7 kg)   BMI 39.80 kg/m   Wt Readings from Last 3 Encounters:  09/15/16 217 lb 9.6 oz (98.7 kg)  09/14/16 213 lb (96.6 kg)  09/11/16 213 lb (96.6 kg)    Gen: NAD, alert, cooperative with exam, NCAT EYES: EOMI, no conjunctival injection, or no icterus CV: NRRR, normal S1/S2, no murmur, distal pulses 2+ b/l Resp: CTABL, no wheezes, normal WOB Ext: No edema, warm Neuro: Alert and oriented MSK: TTP over spiny processes mid back TTP paraspinal muscle bodies lower back, has lidocaine patches in place. Decreased ROM bending  L and R at spine, bending forward No bruising or skin changes on back  Assessment & Plan:  Camera was seen today for back pain.  Diagnoses and all orders for this visit:  Acute low back pain without sciatica, unspecified back pain laterality Has been feeling sore paraspinal muscles since deep tissue massge Better today Discussed avoiding exacerbating movements, gentle stretching with exercises given by PT, cont topicals, can take ibuprofen WITH FOOD for a few days but then needs to stop given gastritis. No abd pain now. Stop ibuprofen if abd pain. -     ibuprofen (ADVIL,MOTRIN) 600 MG tablet; Take 1 tablet (600 mg total) by mouth every 8 (eight) hours as needed.  Tingling in extremities H/o heavy EtOH use, none now Normal blood sugar levels last check No stenosis on MRI from 1 year ago -     Ambulatory referral to Neurology -     Vitamin B12 -     Folate  Other fatigue -     Vitamin B12 -     Folate -     VITAMIN D 25 Hydroxy (Vit-D Deficiency, Fractures)  Follow up plan: 3 mo [name], MD [place]

## 2016-09-16 LAB — VITAMIN D 25 HYDROXY (VIT D DEFICIENCY, FRACTURES): Vit D, 25-Hydroxy: 16.8 ng/mL — ABNORMAL LOW (ref 30.0–100.0)

## 2016-09-16 LAB — VITAMIN B12: VITAMIN B 12: 283 pg/mL (ref 232–1245)

## 2016-09-16 LAB — FOLATE: FOLATE: 2 ng/mL — AB (ref >3.0)

## 2016-09-17 ENCOUNTER — Encounter: Payer: Self-pay | Admitting: Internal Medicine

## 2016-09-19 ENCOUNTER — Encounter (HOSPITAL_COMMUNITY): Payer: Self-pay | Admitting: Internal Medicine

## 2016-09-20 ENCOUNTER — Ambulatory Visit: Payer: Medicare HMO | Admitting: Physical Therapy

## 2016-09-21 ENCOUNTER — Telehealth: Payer: Self-pay | Admitting: Pediatrics

## 2016-09-21 NOTE — Telephone Encounter (Signed)
Patient aware of lab results and verbalizes understanding.  

## 2016-09-28 DIAGNOSIS — L400 Psoriasis vulgaris: Secondary | ICD-10-CM | POA: Diagnosis not present

## 2016-09-28 DIAGNOSIS — Z79899 Other long term (current) drug therapy: Secondary | ICD-10-CM | POA: Diagnosis not present

## 2016-09-30 DIAGNOSIS — M797 Fibromyalgia: Secondary | ICD-10-CM | POA: Diagnosis not present

## 2016-09-30 DIAGNOSIS — M79672 Pain in left foot: Secondary | ICD-10-CM | POA: Diagnosis not present

## 2016-09-30 DIAGNOSIS — J449 Chronic obstructive pulmonary disease, unspecified: Secondary | ICD-10-CM | POA: Diagnosis not present

## 2016-09-30 DIAGNOSIS — W1840XA Slipping, tripping and stumbling without falling, unspecified, initial encounter: Secondary | ICD-10-CM | POA: Diagnosis not present

## 2016-09-30 DIAGNOSIS — K219 Gastro-esophageal reflux disease without esophagitis: Secondary | ICD-10-CM | POA: Diagnosis not present

## 2016-09-30 DIAGNOSIS — M1990 Unspecified osteoarthritis, unspecified site: Secondary | ICD-10-CM | POA: Diagnosis not present

## 2016-09-30 DIAGNOSIS — Z79899 Other long term (current) drug therapy: Secondary | ICD-10-CM | POA: Diagnosis not present

## 2016-09-30 DIAGNOSIS — R69 Illness, unspecified: Secondary | ICD-10-CM | POA: Diagnosis not present

## 2016-09-30 DIAGNOSIS — S93602A Unspecified sprain of left foot, initial encounter: Secondary | ICD-10-CM | POA: Diagnosis not present

## 2016-10-05 DIAGNOSIS — G894 Chronic pain syndrome: Secondary | ICD-10-CM | POA: Diagnosis not present

## 2016-10-05 DIAGNOSIS — M47812 Spondylosis without myelopathy or radiculopathy, cervical region: Secondary | ICD-10-CM | POA: Diagnosis not present

## 2016-10-05 DIAGNOSIS — Z79891 Long term (current) use of opiate analgesic: Secondary | ICD-10-CM | POA: Diagnosis not present

## 2016-10-05 DIAGNOSIS — M47817 Spondylosis without myelopathy or radiculopathy, lumbosacral region: Secondary | ICD-10-CM | POA: Diagnosis not present

## 2016-10-06 ENCOUNTER — Ambulatory Visit: Payer: Medicare HMO | Admitting: Gastroenterology

## 2016-10-06 ENCOUNTER — Telehealth: Payer: Self-pay | Admitting: Gastroenterology

## 2016-10-06 NOTE — Telephone Encounter (Signed)
[id number]  Please call patient regarding her movantic

## 2016-10-06 NOTE — Telephone Encounter (Signed)
LMOM to call.

## 2016-10-09 NOTE — Telephone Encounter (Signed)
LMOM to call.

## 2016-10-09 NOTE — Telephone Encounter (Signed)
Pt left VM that she has completed the Movanik and would like a prescription sent to [facility] in [place].

## 2016-10-10 MED ORDER — NALOXEGOL OXALATE 25 MG PO TABS: 25.0000 mg | ORAL_TABLET | Freq: Every day | ORAL | 5 refills | Status: DC

## 2016-10-10 NOTE — Addendum Note (Signed)
Addended by: Mahala Menghini on: 10/10/2016 08:16 AM   Modules accepted: Orders

## 2016-10-13 ENCOUNTER — Telehealth: Payer: Self-pay

## 2016-10-13 DIAGNOSIS — M25579 Pain in unspecified ankle and joints of unspecified foot: Secondary | ICD-10-CM

## 2016-10-13 NOTE — Telephone Encounter (Signed)
Dr Case is Ortho

## 2016-10-13 NOTE — Telephone Encounter (Signed)
What is referral for and what specialty?

## 2016-10-16 NOTE — Telephone Encounter (Signed)
Went To ER at [facility] after new years  For left foot injury  - they told her that if it did not get better in a week or two - she may need referral or MRI   this ortho referral for that   appt this Thursday with Dr Case   - just needs referral for insurance

## 2016-10-16 NOTE — Telephone Encounter (Signed)
Is this for her shoulder pain?

## 2016-10-16 NOTE — Addendum Note (Signed)
Addended by: Eustaquio Maize on: 10/16/2016 02:03 PM   Modules accepted: Orders

## 2016-10-16 NOTE — Telephone Encounter (Signed)
pt aware of referral -- [facility] can you do this soon - she has appt there on Thurs - Dr Case

## 2016-10-19 DIAGNOSIS — S93602A Unspecified sprain of left foot, initial encounter: Secondary | ICD-10-CM | POA: Diagnosis not present

## 2016-10-23 ENCOUNTER — Encounter: Payer: Self-pay | Admitting: Pediatrics

## 2016-10-23 ENCOUNTER — Ambulatory Visit (INDEPENDENT_AMBULATORY_CARE_PROVIDER_SITE_OTHER): Payer: Medicare HMO | Admitting: Pediatrics

## 2016-10-23 VITALS — BP 118/75 | HR 87 | Temp 97.2°F | Ht 62.0 in | Wt 220.2 lb

## 2016-10-23 DIAGNOSIS — Z6841 Body Mass Index (BMI) 40.0 and over, adult: Secondary | ICD-10-CM

## 2016-10-23 DIAGNOSIS — Z720 Tobacco use: Secondary | ICD-10-CM

## 2016-10-23 DIAGNOSIS — R739 Hyperglycemia, unspecified: Secondary | ICD-10-CM

## 2016-10-23 DIAGNOSIS — R05 Cough: Secondary | ICD-10-CM

## 2016-10-23 DIAGNOSIS — I10 Essential (primary) hypertension: Secondary | ICD-10-CM | POA: Diagnosis not present

## 2016-10-23 DIAGNOSIS — E8941 Symptomatic postprocedural ovarian failure: Secondary | ICD-10-CM | POA: Diagnosis not present

## 2016-10-23 DIAGNOSIS — G43909 Migraine, unspecified, not intractable, without status migrainosus: Secondary | ICD-10-CM

## 2016-10-23 DIAGNOSIS — R059 Cough, unspecified: Secondary | ICD-10-CM

## 2016-10-23 LAB — BAYER DCA HB A1C WAIVED: HB A1C: 5.6 % (ref <7.0)

## 2016-10-23 NOTE — Progress Notes (Signed)
  Subjective:   Patient ID: Judy Evans, female    DOB: 11-19-1970, 46 y.o.   MRN: [id number] CC: Follow-up (3 month) and Fatigue  HPI: Judy Evans is a 46 y.o. female presenting for Follow-up (3 month) and Fatigue  Slipped on kitchen floor with new shoes, didn't fall, stepped hard on L foot to catch self Able to walk at first Later that night had worsening pain in L ankle Seen by orthopedics/Dr Case, wears boot when she walks on it a lot Ortho has been pleased with progress with L ankle  Broke pinky toe R foot 2 weeks ago, doesn't wear shoes around the house  Vit D deficiency: taking daily MVI with vit D  Hot flashes: continues to have them regularly  Shoulder pain: improved Was in PT, has not been back for past few months Dry needling helped  Back Pain: follows with Dr. Francesco Runner  Breathing:  Ever since endoscopy and dilation feels a "rattling" in center chest  Relevant past medical, surgical, family and social history reviewed. Allergies and medications reviewed and updated. History  Smoking Status  . Current Every Day Smoker  . Packs/day: 1.50  . Years: 30.00  . Types: Cigarettes  Smokeless Tobacco  . Never Used   ROS: Per HPI   Objective:    BP 118/75   Pulse 87   Temp 97.2 F (36.2 C) (Oral)   Ht 5\' 2"  (1.575 m)   Wt 220 lb 3.2 oz (99.9 kg)   BMI 40.28 kg/m   Wt Readings from Last 3 Encounters:  10/23/16 220 lb 3.2 oz (99.9 kg)  09/15/16 217 lb 9.6 oz (98.7 kg)  09/14/16 213 lb (96.6 kg)    Gen: NAD, alert, cooperative with exam, NCAT EYES: EOMI, no conjunctival injection, or no icterus ENT:  TMs pearly gray b/l, OP without erythema LYMPH: no cervical LAD CV: NRRR, normal S1/S2, no murmur, distal pulses 2+ b/l Resp: CTABL, no wheezes, normal WOB Abd: +BS, soft, NTND. no guarding or organomegaly Ext: No edema, warm  Neuro: Alert and oriented  Assessment & Plan:  Judy Evans was seen today for follow-up and fatigue.  Diagnoses and all  orders for this visit:  Essential hypertension Not on medication now Well controlled today Cont to check at home  Hot flashes due to surgical menopause Discussed symptomatic control  Elevated blood sugar Check A1c today -     Bayer DCA Hb A1c Waived  Migraine without status migrainosus, not intractable, unspecified migraine type Improved symptoms on amitriptyline, cont  BMI 40.0-44.9, adult ([place]) Discussed lifestyle changes Goal 5 lb weight loss  Tobacco abuse Discussed cessation strategies  Cough Lungs clear Comes and goes Continue to work on smoking cessation, let me know if any worsening  Follow up plan: 3-6 mo [name], MD [place]

## 2016-10-27 ENCOUNTER — Ambulatory Visit (INDEPENDENT_AMBULATORY_CARE_PROVIDER_SITE_OTHER): Payer: Medicare HMO | Admitting: Gastroenterology

## 2016-10-27 ENCOUNTER — Encounter: Payer: Self-pay | Admitting: Gastroenterology

## 2016-10-27 VITALS — BP 126/78 | HR 89 | Temp 98.0°F | Ht 62.0 in | Wt 213.0 lb

## 2016-10-27 DIAGNOSIS — K582 Mixed irritable bowel syndrome: Secondary | ICD-10-CM

## 2016-10-27 DIAGNOSIS — K219 Gastro-esophageal reflux disease without esophagitis: Secondary | ICD-10-CM

## 2016-10-27 NOTE — Assessment & Plan Note (Signed)
Chronic GERD doing well on current PPI regimen. Continue anti-reflex measures. She has been  Unable to come off of PPI therapy due to recurrent severe heartburn. Currently her nausea is resolved. Dysphagia improved status post dilation. Recent episode of chicken become enlarged, possibly due to swallowing a large bolus. She will continue to monitor. If  Ongoing symptoms, would offer barium pill esophagram.

## 2016-10-27 NOTE — Progress Notes (Signed)
Primary Care Physician: Eustaquio Maize, MD  Primary Gastroenterologist:  Garfield Cornea, MD   Chief Complaint  Patient presents with  . Dysphagia    chicken got stuck in throat, was unable to vomit. Throat sore  . Abdominal Pain    left upper side and mid upper abd    HPI: Judy Evans is a 46 y.o. female here done back in December 2017. Patient has history of GERD, left upper quadrant pain, epigastric pain as well as alternating constipation and diarrhea. Vague dysphagia to solids as well. EGD showed normal esophagus, some gastric erosions with benign biopsies. She tells me her swallowing has improved somewhat. The other day however she did get chicken stuck and she had vomited back up. Denies having any chicken bones in it. A little bit of irritation since then but improving. Overall her nausea has dramatically improved. Her lower abdominal pain has resolved. She is having a bowel movement 2-3 times daily on Movantik.  No blood in the stool or melena. Her next colonoscopy is due in 2020 for history of polyps. Overall she states she is feeling much better. Rarely has upper quadrant pain at this point.    She has been started on vitamin B12 and folate per another provider.  Current Outpatient Prescriptions  Medication Sig Dispense Refill  . acetaminophen (TYLENOL) 500 MG tablet Take 1,000 mg by mouth every 6 (six) hours as needed for mild pain.    Marland Kitchen albuterol (VENTOLIN HFA) 108 (90 Base) MCG/ACT inhaler INHALE 2 PUFFS INTO THE LUNGS EVERY 4 (FOUR) HOURS AS NEEDED FOR WHEEZING OR SHORTNESS OF BREATH. 18 Inhaler 5  . amitriptyline (ELAVIL) 25 MG tablet Take 25 mg by mouth at bedtime.    . cetirizine (ZYRTEC) 10 MG tablet TAKE 1 TABLET (10 MG TOTAL) BY MOUTH DAILY. 30 tablet 8  . diclofenac sodium (VOLTAREN) 1 % GEL Apply 4 g topically 4 (four) times daily. (Patient taking differently: Apply 4 g topically 4 (four) times daily as needed (pain). ) 100 g 2  . DULoxetine (CYMBALTA)  60 MG capsule Take 60 mg by mouth daily.     . fluticasone (FLONASE) 50 MCG/ACT nasal spray Place 2 sprays into both nostrils daily. 16 g 0  . gabapentin (NEURONTIN) 800 MG tablet Take 800 mg by mouth 4 (four) times daily.    Marland Kitchen HYDROmorphone (DILAUDID) 2 MG tablet Take 2 mg by mouth 4 (four) times daily.   0  . ibuprofen (ADVIL,MOTRIN) 600 MG tablet Take 1 tablet (600 mg total) by mouth every 8 (eight) hours as needed. 15 tablet 0  . lidocaine (LIDODERM) 5 % PLACE 1 PATCH ONTO THE SKIN DAILY. REMOVE & DISCARD PATCH WITHIN 12 HOURS OR AS DIRECTED BY MD 30 patch 2  . naloxegol oxalate (MOVANTIK) 25 MG TABS tablet Take 1 tablet (25 mg total) by mouth daily. Take one hour before or 2 hours after a meal. 30 tablet 5  . omeprazole (PRILOSEC) 20 MG capsule TAKE 1 CAPSULE (20 MG TOTAL) BY MOUTH DAILY. 90 capsule 1  . triamcinolone lotion (KENALOG) 0.1 % Apply 1 application topically 2 (two) times daily.     No current facility-administered medications for this visit.     Allergies as of 10/27/2016 - Review Complete 10/27/2016  Allergen Reaction Noted  . Rosuvastatin calcium Swelling 11/25/2014  . Statins Swelling 11/25/2014  . Hctz [hydrochlorothiazide] Nausea And Vomiting and Rash 01/12/2014  . Hydrocodone-acetaminophen Rash 04/13/2016  . Oxycodone Rash 04/13/2016  .  Penicillins Nausea And Vomiting and Rash 01/12/2014    ROS:  General: Negative for anorexia, weight loss, fever, chills, fatigue, weakness. ENT: Negative for hoarseness, difficulty swallowing , nasal congestion. CV: Negative for chest pain, angina, palpitations, dyspnea on exertion, peripheral edema.  Respiratory: Negative for dyspnea at rest, dyspnea on exertion, cough, sputum, wheezing.  GI: See history of present illness. GU:  Negative for dysuria, hematuria, urinary incontinence, urinary frequency, nocturnal urination.  Endo: Negative for unusual weight change.    Physical Examination:   BP 126/78   Pulse 89   Temp 98  F (36.7 C) (Oral)   Ht 5\' 2"  (1.575 m)   Wt 213 lb (96.6 kg)   BMI 38.96 kg/m   General: Well-nourished, well-developed in no acute distress.  Eyes: No icterus. Mouth: Oropharyngeal mucosa moist and pink , no lesions erythema or exudate. Lungs: Clear to auscultation bilaterally.  Heart: Regular rate and rhythm, no murmurs rubs or gallops.  Abdomen: Bowel sounds are normal, nontender, nondistended, no hepatosplenomegaly or masses, no abdominal bruits or hernia , no rebound or guarding.   Extremities: No lower extremity edema. No clubbing or deformities. Neuro: Alert and oriented x 4   Skin: Warm and dry, no jaundice.   Psych: Alert and cooperative, normal mood and affect.  Labs:  Lab Results  Component Value Date   FOLATE 2.0 (L) 09/15/2016   Lab Results  Component Value Date   [id number] 09/15/2016   Lab Results  Component Value Date   TSH 0.94 07/07/2016   Lab Results  Component Value Date   WBC 6.5 07/07/2016   HGB 15.8 (H) 07/07/2016   HCT 46.9 (H) 07/07/2016   MCV 90.7 07/07/2016   PLT 235 07/07/2016   Lab Results  Component Value Date   CREATININE 0.76 07/07/2016   BUN 5 (L) 07/07/2016   NA 139 07/07/2016   K 3.9 07/07/2016   CL 104 07/07/2016   CO2 25 07/07/2016   Lab Results  Component Value Date   ALT 12 07/07/2016   AST 13 07/07/2016   ALKPHOS 83 07/07/2016   BILITOT 0.5 07/07/2016   TTG IGa and IgA normal.  Imaging Studies: No results found.

## 2016-10-27 NOTE — Assessment & Plan Note (Signed)
Doing well on Movantik. Return to the office in six months or call sooner if change in symptoms.

## 2016-10-27 NOTE — Patient Instructions (Addendum)
1. If you swallowing problems increase, let me know and we will plan on an xray of your esophagus.  2. Continue Movantik as long as you are on pain medication and are having constipation.  3. We will see you back in six months or sooner if needed. If your abdominal pain becomes more frequent let us know.

## 2016-10-30 ENCOUNTER — Encounter: Payer: Self-pay | Admitting: Internal Medicine

## 2016-10-30 NOTE — Progress Notes (Signed)
CC'ED TO PCP 

## 2016-11-13 ENCOUNTER — Encounter: Payer: Self-pay | Admitting: Neurology

## 2016-11-13 ENCOUNTER — Ambulatory Visit (INDEPENDENT_AMBULATORY_CARE_PROVIDER_SITE_OTHER): Payer: Medicare HMO | Admitting: Neurology

## 2016-11-13 VITALS — BP 132/60 | HR 70 | Resp 18 | Ht 62.0 in | Wt 216.0 lb

## 2016-11-13 DIAGNOSIS — M545 Low back pain: Secondary | ICD-10-CM

## 2016-11-13 DIAGNOSIS — M542 Cervicalgia: Secondary | ICD-10-CM

## 2016-11-13 DIAGNOSIS — G43709 Chronic migraine without aura, not intractable, without status migrainosus: Secondary | ICD-10-CM | POA: Diagnosis not present

## 2016-11-13 DIAGNOSIS — M25512 Pain in left shoulder: Secondary | ICD-10-CM

## 2016-11-13 DIAGNOSIS — M797 Fibromyalgia: Secondary | ICD-10-CM | POA: Diagnosis not present

## 2016-11-13 DIAGNOSIS — G8929 Other chronic pain: Secondary | ICD-10-CM

## 2016-11-13 DIAGNOSIS — IMO0002 Reserved for concepts with insufficient information to code with codable children: Secondary | ICD-10-CM

## 2016-11-13 MED ORDER — AMITRIPTYLINE HCL 25 MG PO TABS: 25.0000 mg | ORAL_TABLET | Freq: Every day | ORAL | 11 refills | Status: DC

## 2016-11-13 MED ORDER — SUMATRIPTAN SUCCINATE 100 MG PO TABS: 100.0000 mg | ORAL_TABLET | Freq: Once | ORAL | 5 refills | Status: DC | PRN

## 2016-11-13 NOTE — Progress Notes (Signed)
[facility]  PATIENT: Judy Evans DOB: Jul 24, 1971  REFERRING DOCTOR OR PCP:  Dr. Evette Doffing SOURCE: Patient, notes from Dr. Evette Doffing imaging reports, MRI images on PACS.  _________________________________   HISTORICAL  CHIEF COMPLAINT:  Chief Complaint  Patient presents with  . Headaches    Judy Evans is here for eval of h/a's.  Sts. she has a hx. of migraines, has been on Amitriptyline 25mg  qhs with relief and would like to discuss restarting this.  She also c/o numbness, tingling in hands and bilat lower legs.  Hx. of right carpal tunnel release.  She sees a pain mx. physician (Dr. Francesco Runner at [facility]) for chronic pain related to Fibromyalgia, DDD/fim  . Numbness    HISTORY OF PRESENT ILLNESS:  I had the pleasure seeing you patient, Judy Evans, at [facility] for neurologic consultation regarding her headaches.  She has had headaches off and on x many years but they worsened 4-5 years ago. She had three especially bad headaches associated with difficulty speaking.   She saw a neurologist at [facility].   She was placed on amitriptyline with some benefit.   However, when she moved, her new PCP would not right amitriptyline for her.   She was referred to a pain management practice.   Amitriptyline was started back a few months ago and she did better.   Unfortunately, she has run out.     She has migraines daily off of amitriptyline but has only a few a.month on amitriptyline.  When they occur, she often gets sharp pains on the right.   Pain is stabbing.   She gets nausea but rarely has vomiting.   She has had no furhter migraine with aphasia.  Moving increases her pain.   Bright lights and noises increases her pain.   Triptans help sometimes but not always.   Tylenol arthritis helps a little bit.   .  She has LBP and neck pain and Fibromyalgia.    She is on Cymbalta and gabapentin (800 mg po qid) with some benefit.   She is also  on Dilaudid 2 mg qid with benfit.  She has had lumbar spinal injections with benefit.    She sees Dr. Francesco Runner for [facility]) for pain management.        She also has shoulder pain on her left.   Pain increases with elevation and external rotation.    I personally reviewed an MRI of the cervical spine dated 07/14/2016. At C3-C4, she has a disc osteophyte complex causing moderate left foraminal narrowing with some encroachment upon the left C4 nerve root. At C5-C6 she has mild left foraminal narrowing due to a small disc osteophyte complex. There does not appear to be any nerve root compression.  REVIEW OF SYSTEMS: Constitutional: No fevers, chills, sweats, or change in appetite Eyes: No visual changes, double vision, eye pain Ear, nose and throat: No hearing loss, ear pain, nasal congestion, sore throat Cardiovascular: No chest pain, palpitations Respiratory: No shortness of breath at rest or with exertion.   No wheezes GastrointestinaI: has Irritable bowel Genitourinary: No dysuria, urinary retention or frequency.  No nocturia. Musculoskeletal: as above, fibromyalgia/neck pain/ shoulder pain/back pain Integumentary: Has psoriasis Neurological: as above Psychiatric: No depression at this time.  No anxiety Endocrine: has pre-diabetesNo palpitations, diaphoresis, change in appetite, change in weigh or increased thirst Hematologic/Lymphatic: No anemia, purpura, petechiae. Allergic/Immunologic: No itchy/runny eyes, nasal congestion, recent allergic reactions, rashes  ALLERGIES: Allergies  Allergen  Reactions  . Rosuvastatin Calcium Swelling    Ends up in the hospital  . Statins Swelling    Ends up in the hospital  . Hctz [Hydrochlorothiazide] Nausea And Vomiting and Rash  . Hydrocodone-Acetaminophen Rash  . Oxycodone Rash  . Penicillins Nausea And Vomiting and Rash    Has patient had a PCN reaction causing immediate rash, facial/tongue/throat swelling, SOB or  lightheadedness with hypotension: Yes Has patient had a PCN reaction causing severe rash involving mucus membranes or skin necrosis: No Has patient had a PCN reaction that required hospitalization No Has patient had a PCN reaction occurring within the last 10 years: No If all of the above answers are "NO", then may proceed with Cephalosporin use.     HOME MEDICATIONS:  Current Outpatient Prescriptions:  .  acetaminophen (TYLENOL) 500 MG tablet, Take 1,000 mg by mouth every 6 (six) hours as needed for mild pain., Disp: , Rfl:  .  albuterol (VENTOLIN HFA) 108 (90 Base) MCG/ACT inhaler, INHALE 2 PUFFS INTO THE LUNGS EVERY 4 (FOUR) HOURS AS NEEDED FOR WHEEZING OR SHORTNESS OF BREATH., Disp: 18 Inhaler, Rfl: 5 .  cetirizine (ZYRTEC) 10 MG tablet, TAKE 1 TABLET (10 MG TOTAL) BY MOUTH DAILY., Disp: 30 tablet, Rfl: 8 .  diclofenac sodium (VOLTAREN) 1 % GEL, Apply 4 g topically 4 (four) times daily. (Patient taking differently: Apply 4 g topically 4 (four) times daily as needed (pain). ), Disp: 100 g, Rfl: 2 .  DULoxetine (CYMBALTA) 60 MG capsule, Take 60 mg by mouth daily. , Disp: , Rfl:  .  fluticasone (FLONASE) 50 MCG/ACT nasal spray, Place 2 sprays into both nostrils daily., Disp: 16 g, Rfl: 0 .  gabapentin (NEURONTIN) 800 MG tablet, Take 800 mg by mouth 4 (four) times daily., Disp: , Rfl:  .  HYDROmorphone (DILAUDID) 2 MG tablet, Take 2 mg by mouth 4 (four) times daily. , Disp: , Rfl: 0 .  ibuprofen (ADVIL,MOTRIN) 600 MG tablet, Take 1 tablet (600 mg total) by mouth every 8 (eight) hours as needed., Disp: 15 tablet, Rfl: 0 .  lidocaine (LIDODERM) 5 %, PLACE 1 PATCH ONTO THE SKIN DAILY. REMOVE & DISCARD PATCH WITHIN 12 HOURS OR AS DIRECTED BY MD, Disp: 30 patch, Rfl: 2 .  naloxegol oxalate (MOVANTIK) 25 MG TABS tablet, Take 1 tablet (25 mg total) by mouth daily. Take one hour before or 2 hours after a meal., Disp: 30 tablet, Rfl: 5 .  omeprazole (PRILOSEC) 20 MG capsule, TAKE 1 CAPSULE (20 MG TOTAL)  BY MOUTH DAILY., Disp: 90 capsule, Rfl: 1 .  triamcinolone lotion (KENALOG) 0.1 %, Apply 1 application topically 2 (two) times daily., Disp: , Rfl:  .  amitriptyline (ELAVIL) 25 MG tablet, Take 1 tablet (25 mg total) by mouth at bedtime., Disp: 30 tablet, Rfl: 11 .  SUMAtriptan (IMITREX) 100 MG tablet, Take 1 tablet (100 mg total) by mouth once as needed for migraine. May repeat in 2 hours if headache persists or recurs., Disp: 10 tablet, Rfl: 5  PAST MEDICAL HISTORY: Past Medical History:  Diagnosis Date  . Anxiety   . Aphasia   . Arthritis   . COPD (chronic obstructive pulmonary disease) ([place])   . DDD (degenerative disc disease), cervical   . DDD (degenerative disc disease), lumbar   . Degenerative disc disease at L5-S1 level   . Diabetes mellitus without complication ([place])    States she does not have  . Fibromyalgia   . GERD (gastroesophageal reflux disease)   .  H. pylori infection   . Hyperlipidemia   . Hypertension    States she does not have  . IBS (irritable bowel syndrome)   . LLQ pain 06/18/2015  . Migraines   . [place] 01/22/2015  . Pain with urination 02/19/2015  . Psoriasis   . Scoliosis   . Spondylolysis     PAST SURGICAL HISTORY: Past Surgical History:  Procedure Laterality Date  . ABDOMINAL HYSTERECTOMY    . BIOPSY  09/14/2016   Procedure: BIOPSY;  Surgeon: Daneil Dolin, MD;  Location: [facility];  Service: Endoscopy;;  gastric   . CARDIAC CATHETERIZATION  2015   patient reported it to be normal. "I was dying in pain during procedure".   . CARPAL TUNNEL RELEASE Right   . CESAREAN SECTION    . CHOLECYSTECTOMY    . COLONOSCOPY  06/2014   Altamese Dilling DeMason: normal  . DILATION AND CURETTAGE OF UTERUS    . ESOPHAGOGASTRODUODENOSCOPY  07/2014   Burke Keels: Normal  . ESOPHAGOGASTRODUODENOSCOPY (EGD) WITH PROPOFOL N/A 09/14/2016   Dr. Gala Romney: Normal esophagus status post empiric dilation, erosive gastropathy with biopsies showing chronic inactive gastritis, no H  pylori.  Marland Kitchen FOOT SURGERY Bilateral    removal of heel spurs  . MALONEY DILATION  09/14/2016   Procedure: Venia Minks DILATION;  Surgeon: Daneil Dolin, MD;  Location: [facility];  Service: Endoscopy;;  . rotator cuff surgery Right   . TONSILLECTOMY      FAMILY HISTORY: Family History  Problem Relation Age of Onset  . COPD Mother   . Diabetes Mother   . Osteoarthritis Mother   . Healthy Father   . Lupus Sister   . CAD Sister   . Diabetes Sister   . Early death Brother     pneumonia  . Asthma Daughter   . Asthma Son   . Cancer Maternal Grandmother 13    colon  . Arthritis Maternal Grandmother   . Diabetes Maternal Grandmother   . Dementia Maternal Grandmother   . Other Maternal Grandfather     brain tumor  . Cancer Paternal Grandmother     breast  . Cancer Paternal Grandfather     lung  . Other Paternal Grandfather     brain aneurysm  . Factor V Leiden deficiency Sister     SOCIAL HISTORY:  Social History   Social History  . Marital status: Married    Spouse name: N/A  . Number of children: N/A  . Years of education: N/A   Occupational History  . Not on file.   Social History Main Topics  . Smoking status: Current Every Day Smoker    Packs/day: 1.50    Years: 30.00    Types: Cigarettes  . Smokeless tobacco: Never Used  . Alcohol use No  . Drug use: No  . Sexual activity: Not Currently    Birth control/ protection: Surgical     Comment: hyst   Other Topics Concern  . Not on file   Social History Narrative  . No narrative on file     PHYSICAL EXAM  Vitals:   11/13/16 1034  BP: 132/60  Pulse: 70  Resp: 18  Weight: 216 lb (98 kg)  Height: 5\' 2"  (1.575 m)    Body mass index is 39.51 kg/m.   General: The patient is well-developed and well-nourished and in no acute distress  Eyes:  Funduscopic exam shows normal optic discs and retinal vessels.  Neck: The neck is supple, no  carotid bruits are noted.  The neck is  nontender.  Cardiovascular: The heart has a regular rate and rhythm with a normal S1 and S2. There were no murmurs, gallops or rubs. Lungs are clear to auscultation.  Skin: Extremities are without significant edema.  Musculoskeletal:  There is tenderness at the right greater than left splenius capitis/occipital nerves.   She has a lot of tenderness over the subacromial bursa and mild tenderness over the right subacromial bursa. She has slightly reduced range of motion in the left shoulder due to pain limitation. She has tenderness over many of the classic fibromyalgia tender points.   Neurologic Exam  Mental status: The patient is alert and oriented x 3 at the time of the examination. The patient has apparent normal recent and remote memory, with an apparently normal attention span and concentration ability.   Speech is normal.  Cranial nerves: Extraocular movements are full. Pupils are equal, round, and reactive to light and accomodation.  Visual fields are full.  Facial symmetry is present. There is good facial sensation to soft touch bilaterally.Facial strength is normal.  Trapezius and sternocleidomastoid strength is normal. No dysarthria is noted.  The tongue is midline, and the patient has symmetric elevation of the soft palate. No obvious hearing deficits are noted.  Motor:  Muscle bulk is normal.   Tone is normal. Strength is  5 / 5 in all 4 extremities.   Sensory: Sensory testing is intact to pinprick, soft touch and vibration sensation in all 4 extremities.  Coordination: Cerebellar testing reveals good finger-nose-finger and heel-to-shin bilaterally.  Gait and station: Station is normal.   Gait is normal. Tandem gait is normal. Romberg is negative.   Reflexes: Deep tendon reflexes are symmetric and normal bilaterally.   Plantar responses are flexor.    DIAGNOSTIC DATA (LABS, IMAGING, TESTING) - I reviewed patient records, labs, notes, testing and imaging myself where  available.  Lab Results  Component Value Date   WBC 6.5 07/07/2016   HGB 15.8 (H) 07/07/2016   HCT 46.9 (H) 07/07/2016   MCV 90.7 07/07/2016   PLT 235 07/07/2016      Component Value Date/Time   NA 139 07/07/2016 1525   NA 136 11/01/2015 1312   K 3.9 07/07/2016 1525   CL 104 07/07/2016 1525   CO2 25 07/07/2016 1525   GLUCOSE 98 07/07/2016 1525   BUN 5 (L) 07/07/2016 1525   BUN 4 (L) 11/01/2015 1312   CREATININE 0.76 07/07/2016 1525   CALCIUM 9.0 07/07/2016 1525   PROT 6.3 07/07/2016 1525   PROT 6.6 11/01/2015 1312   ALBUMIN 4.1 07/07/2016 1525   ALBUMIN 4.2 11/01/2015 1312   AST 13 07/07/2016 1525   ALT 12 07/07/2016 1525   ALKPHOS 83 07/07/2016 1525   BILITOT 0.5 07/07/2016 1525   BILITOT 0.5 11/01/2015 1312   GFRNONAA 83 11/01/2015 1312   GFRAA 96 11/01/2015 1312   No results found for: CHOL, HDL, LDLCALC, LDLDIRECT, TRIG, CHOLHDL Lab Results  Component Value Date   HGBA1C 5.6 11/01/2015   Lab Results  Component Value Date   VITAMINB12 283 09/15/2016   Lab Results  Component Value Date   TSH 0.94 07/07/2016       ASSESSMENT AND PLAN  Chronic migraine  Fibromyalgia  Neck pain  Left shoulder pain, unspecified chronicity  Chronic bilateral low back pain without sciatica   In summary, Judy Evans is a 46 year old woman with migraine headaches, fibromyalgia and chronic neck and back pain. She  also has left shoulder pain has been worsening over the past few months. In the past, headaches were reasonably well controlled on amitriptyline and she would like to go back on for prophylaxis.  This may also help her fibromyalgia pain better. Additionally, in the past she had tried sumatriptan with some benefit. I will have her take that as needed for more severe headaches, not to exceed 2-3 pills a week.   For her shoulder pain which appears to be a combination of subacromial bursitis and fibromyalgia, I did a left subacromial bursa injection with 40 mg  Depo-Medrol in 3 mL Marcaine. She tolerated the procedure well and there were no complications. Pain was significantly better a few minutes later.  She will return to see me in 4 months and call sooner if she has significant worsening of her migraine. She will continue to see pain management for her back and neck pain.  Thank you for asking me to see Mrs. Towe. Please let me know if I can be of further assistance with her or other patients in the future.    Judy Reposa A. Felecia Shelling, MD, PhD [id number], [id number] PM Certified in Neurology, Clinical Neurophysiology, Sleep Medicine, Pain Medicine and Neuroimaging  [facility] [address] [place], [place] [address] [phone]

## 2017-01-02 DIAGNOSIS — M47812 Spondylosis without myelopathy or radiculopathy, cervical region: Secondary | ICD-10-CM | POA: Diagnosis not present

## 2017-01-02 DIAGNOSIS — M47817 Spondylosis without myelopathy or radiculopathy, lumbosacral region: Secondary | ICD-10-CM | POA: Diagnosis not present

## 2017-01-02 DIAGNOSIS — M797 Fibromyalgia: Secondary | ICD-10-CM | POA: Diagnosis not present

## 2017-01-02 DIAGNOSIS — Z79891 Long term (current) use of opiate analgesic: Secondary | ICD-10-CM | POA: Diagnosis not present

## 2017-01-02 DIAGNOSIS — M170 Bilateral primary osteoarthritis of knee: Secondary | ICD-10-CM | POA: Diagnosis not present

## 2017-01-02 DIAGNOSIS — G894 Chronic pain syndrome: Secondary | ICD-10-CM | POA: Diagnosis not present

## 2017-01-02 DIAGNOSIS — M5030 Other cervical disc degeneration, unspecified cervical region: Secondary | ICD-10-CM | POA: Diagnosis not present

## 2017-01-10 DIAGNOSIS — L400 Psoriasis vulgaris: Secondary | ICD-10-CM | POA: Diagnosis not present

## 2017-01-10 DIAGNOSIS — L719 Rosacea, unspecified: Secondary | ICD-10-CM | POA: Diagnosis not present

## 2017-01-10 DIAGNOSIS — Z79899 Other long term (current) drug therapy: Secondary | ICD-10-CM | POA: Diagnosis not present

## 2017-01-18 DIAGNOSIS — M79672 Pain in left foot: Secondary | ICD-10-CM | POA: Diagnosis not present

## 2017-01-18 DIAGNOSIS — S93622D Sprain of tarsometatarsal ligament of left foot, subsequent encounter: Secondary | ICD-10-CM | POA: Diagnosis not present

## 2017-01-22 ENCOUNTER — Ambulatory Visit (INDEPENDENT_AMBULATORY_CARE_PROVIDER_SITE_OTHER): Payer: Medicare HMO | Admitting: Pediatrics

## 2017-01-22 ENCOUNTER — Encounter: Payer: Self-pay | Admitting: Pediatrics

## 2017-01-22 VITALS — BP 111/70 | HR 93 | Temp 98.3°F | Ht 62.0 in | Wt 219.6 lb

## 2017-01-22 DIAGNOSIS — R7303 Prediabetes: Secondary | ICD-10-CM | POA: Diagnosis not present

## 2017-01-22 DIAGNOSIS — E785 Hyperlipidemia, unspecified: Secondary | ICD-10-CM

## 2017-01-22 DIAGNOSIS — F32A Depression, unspecified: Secondary | ICD-10-CM

## 2017-01-22 DIAGNOSIS — J441 Chronic obstructive pulmonary disease with (acute) exacerbation: Secondary | ICD-10-CM

## 2017-01-22 DIAGNOSIS — E559 Vitamin D deficiency, unspecified: Secondary | ICD-10-CM | POA: Diagnosis not present

## 2017-01-22 DIAGNOSIS — I10 Essential (primary) hypertension: Secondary | ICD-10-CM

## 2017-01-22 DIAGNOSIS — F329 Major depressive disorder, single episode, unspecified: Secondary | ICD-10-CM | POA: Diagnosis not present

## 2017-01-22 DIAGNOSIS — R69 Illness, unspecified: Secondary | ICD-10-CM | POA: Diagnosis not present

## 2017-01-22 LAB — BAYER DCA HB A1C WAIVED: HB A1C (BAYER DCA - WAIVED): 5.7 % (ref <7.0)

## 2017-01-22 MED ORDER — SPACER/AERO CHAMBER MOUTHPIECE MISC: 1.0000 | Freq: Four times a day (QID) | 0 refills | Status: DC | PRN

## 2017-01-22 MED ORDER — PREDNISONE 20 MG PO TABS: ORAL_TABLET | ORAL | 0 refills | Status: DC

## 2017-01-22 MED ORDER — UMECLIDINIUM-VILANTEROL 62.5-25 MCG/INH IN AEPB: 1.0000 | INHALATION_SPRAY | Freq: Every day | RESPIRATORY_TRACT | 2 refills | Status: DC

## 2017-01-22 MED ORDER — LEVOFLOXACIN 750 MG PO TABS: 750.0000 mg | ORAL_TABLET | Freq: Every day | ORAL | 0 refills | Status: DC

## 2017-01-22 NOTE — Progress Notes (Signed)
Subjective:   Patient ID: Judy Evans, female    DOB: [birth year], 46 y.o.   MRN: [id number] CC: Follow-up (3 month); Cough; and Shortness of Breath  HPI: Judy Evans is a 46 y.o. female presenting for Follow-up (3 month); Cough; and Shortness of Breath  Coughing up more than usual Feeling more SOB Mucus white/foamy at first, now yellow  Using albuterol inhaler several times a day for past 2 weeks since being sick  Says she has been smoking less the last two weeks No fevers No sore throat other than with coughing  Depression: Feeling miserable and sad on the cymbalta Sad about multiple chronic med problems worsening, worsening pain Seen by orthopedics, follows with chronic pain May need upcoming surgery on L leg Sits around and cries when she is feeling bad, turns off phone, goes to bed Is happy when with her granddaughter 34.5 yo  Denies any thoughts of self harm, says she had SI years ago and ended up in mental hospital then, hasnt happened recently  HTN: no CP, no HA, not on meds now  HLD: allergy to statins   Depression screen [facility] 2/9 01/22/2017 10/23/2016 09/15/2016 07/25/2016 07/21/2016  Decreased Interest 0 0 0 0 0  Down, Depressed, Hopeless 0 0 0 0 0  PHQ - 2 Score 0 0 0 0 0   Relevant past medical, surgical, family and social history reviewed. Allergies and medications reviewed and updated. History  Smoking Status  . Current Every Day Smoker  . Packs/day: 1.50  . Years: 30.00  . Types: Cigarettes  Smokeless Tobacco  . Never Used   ROS: Per HPI   Objective:    BP 111/70   Pulse 93   Temp 98.3 F (36.8 C) (Oral)   Ht '5\' 2"'$  (1.575 m)   Wt 219 lb 9.6 oz (99.6 kg)   BMI 40.17 kg/m   Wt Readings from Last 3 Encounters:  01/22/17 219 lb 9.6 oz (99.6 kg)  11/13/16 216 lb (98 kg)  10/27/16 213 lb (96.6 kg)  O2 94%   Gen: NAD, alert, cooperative with exam, NCAT EYES: EOMI, no conjunctival injection, or no icterus ENT:  TMs pink, dull b/l,  OP without erythema LYMPH: no cervical LAD CV: NRRR, normal S1/S2, no murmur, distal pulses 2+ b/l Resp: comfortable WOB, moving air well, wheezing throughout with forced exhalation, CTABL, no wheezes, normal WOB Ext: No edema, warm Neuro: Alert and oriented Psych: tearful at times, denies thoughts of self harm  Assessment & Plan:  Judy Evans was seen today for follow-up, cough and shortness of breath.  Diagnoses and all orders for this visit:  Pre-diabetes A1c stable at 5.7 Cont decreasing sugar intake -     Bayer DCA Hb A1c Waived -     Microalbumin / creatinine urine ratio -     CMP14+EGFR -     Lipid panel  Essential hypertension Well controlled, not on medication -     Bayer DCA Hb A1c Waived -     Microalbumin / creatinine urine ratio -     CMP14+EGFR -     Lipid panel  Hyperlipidemia, unspecified hyperlipidemia type Allergic to crestor, recheck  level -     Lipid panel  COPD with acute exacerbation (HCC) O2 sats 94% Increased cough, change in sputum, increased and more yellow Treat for exacerbation with below Start anoro for control -     levofloxacin (LEVAQUIN) 750 MG tablet; Take 1 tablet (750 mg total) by mouth daily. -  predniSONE (DELTASONE) 20 MG tablet; 2 po at same time daily for 5 days -     Spacer/Aero Chamber Mouthpiece MISC; 1 each by Does not apply route every 6 (six) hours as needed. -     umeclidinium-vilanterol (ANORO ELLIPTA) 62.5-25 MCG/INH AEPB; Inhale 1 puff into the lungs daily.  Vitamin D deficiency -     VITAMIN D 25 Hydroxy (Vit-D Deficiency, Fractures)  Depression, unspecified depression type Ongoing symptoms Feels safe at home, no thoughts of self harm Cont meds, will refer to [place]  Follow up plan: Return in about 4 weeks (around 02/19/2017). [name], MD [place]

## 2017-01-23 LAB — MICROALBUMIN / CREATININE URINE RATIO
CREATININE, UR: 20.7 mg/dL
Microalb/Creat Ratio: <14.5 mg/g creat (ref 0.0–30.0)

## 2017-01-23 LAB — CMP14+EGFR
A/G RATIO: 1.8 (ref 1.2–2.2)
ALK PHOS: 127 IU/L — AB (ref 39–117)
ALT: 19 IU/L (ref 0–32)
AST: 20 IU/L (ref 0–40)
Albumin: 4 g/dL (ref 3.5–5.5)
BUN/Creatinine Ratio: 9 (ref 9–23)
BUN: 6 mg/dL (ref 6–24)
Bilirubin Total: 0.5 mg/dL (ref 0.0–1.2)
CO2: 26 mmol/L (ref 18–29)
CREATININE: 0.67 mg/dL (ref 0.57–1.00)
Calcium: 9.2 mg/dL (ref 8.7–10.2)
Chloride: 100 mmol/L (ref 96–106)
GFR calc Af Amer: 123 mL/min/{1.73_m2} (ref >59)
GFR calc non Af Amer: 106 mL/min/{1.73_m2} (ref >59)
GLOBULIN, TOTAL: 2.2 g/dL (ref 1.5–4.5)
Glucose: 124 mg/dL — ABNORMAL HIGH (ref 65–99)
POTASSIUM: 4.2 mmol/L (ref 3.5–5.2)
SODIUM: 140 mmol/L (ref 134–144)
Total Protein: 6.2 g/dL (ref 6.0–8.5)

## 2017-01-23 LAB — LIPID PANEL
CHOL/HDL RATIO: 4.3 ratio (ref 0.0–4.4)
CHOLESTEROL TOTAL: 199 mg/dL (ref 100–199)
HDL: 46 mg/dL (ref >39)
LDL Calculated: 116 mg/dL — ABNORMAL HIGH (ref 0–99)
TRIGLYCERIDES: 187 mg/dL — AB (ref 0–149)
VLDL Cholesterol Cal: 37 mg/dL (ref 5–40)

## 2017-01-25 DIAGNOSIS — W19XXXA Unspecified fall, initial encounter: Secondary | ICD-10-CM | POA: Diagnosis not present

## 2017-01-25 DIAGNOSIS — M79675 Pain in left toe(s): Secondary | ICD-10-CM | POA: Diagnosis not present

## 2017-01-25 DIAGNOSIS — M79672 Pain in left foot: Secondary | ICD-10-CM | POA: Diagnosis not present

## 2017-01-25 LAB — VITAMIN D 25 HYDROXY (VIT D DEFICIENCY, FRACTURES): Vit D, 25-Hydroxy: 23.4 ng/mL — ABNORMAL LOW (ref 30.0–100.0)

## 2017-01-25 LAB — SPECIMEN STATUS REPORT

## 2017-01-29 ENCOUNTER — Other Ambulatory Visit: Payer: Self-pay | Admitting: Pediatrics

## 2017-01-29 DIAGNOSIS — E559 Vitamin D deficiency, unspecified: Secondary | ICD-10-CM

## 2017-01-29 MED ORDER — VITAMIN D (ERGOCALCIFEROL) 1.25 MG (50000 UNIT) PO CAPS: 50000.0000 [IU] | ORAL_CAPSULE | ORAL | 0 refills | Status: DC

## 2017-01-30 ENCOUNTER — Encounter: Payer: Self-pay | Admitting: Pediatrics

## 2017-01-30 ENCOUNTER — Ambulatory Visit (INDEPENDENT_AMBULATORY_CARE_PROVIDER_SITE_OTHER): Payer: Medicare HMO | Admitting: Pediatrics

## 2017-01-30 VITALS — BP 122/70 | HR 90 | Temp 98.0°F | Ht 62.0 in | Wt 224.6 lb

## 2017-01-30 DIAGNOSIS — J441 Chronic obstructive pulmonary disease with (acute) exacerbation: Secondary | ICD-10-CM | POA: Diagnosis not present

## 2017-01-30 DIAGNOSIS — E559 Vitamin D deficiency, unspecified: Secondary | ICD-10-CM | POA: Diagnosis not present

## 2017-01-30 DIAGNOSIS — L409 Psoriasis, unspecified: Secondary | ICD-10-CM | POA: Diagnosis not present

## 2017-01-30 NOTE — Progress Notes (Signed)
  Subjective:   Patient ID: Judy Evans, female    DOB: [birth year], 46 y.o.   MRN: [id number] CC: Follow-up (Pneumonia, COPD)  HPI: Judy Evans is a 46 y.o. female presenting for Follow-up (Pneumonia, COPD)  Still coughing some, much improved Feeling flushed off and on Has spent a lot of time in bed past week  Still with some congestion  Following with dermatology for psoriasis hasnt been using topical steroids recently Prednisone from last week improved psoriasis  Relevant past medical, surgical, family and social history reviewed. Allergies and medications reviewed and updated. History  Smoking Status  . Current Every Day Smoker  . Packs/day: 1.50  . Years: 30.00  . Types: Cigarettes  Smokeless Tobacco  . Never Used   ROS: Per HPI   Objective:    BP 122/70   Pulse 90   Temp 98 F (36.7 C) (Oral)   Ht 5\' 2"  (1.575 m)   Wt 224 lb 9.6 oz (101.9 kg)   BMI 41.08 kg/m   Wt Readings from Last 3 Encounters:  01/30/17 224 lb 9.6 oz (101.9 kg)  01/22/17 219 lb 9.6 oz (99.6 kg)  11/13/16 216 lb (98 kg)    Gen: NAD, alert, cooperative with exam, NCAT EYES: EOMI, no conjunctival injection, or no icterus ENT:  TMs pearly gray b/l, OP without erythema LYMPH: no cervical LAD CV: NRRR, normal S1/S2, no murmur, distal pulses 2+ b/l Resp: moving air well, slight end exhalation wheeze with forced exhalation, much improved, comfortable WOB Abd: +BS, soft, NTND.  Ext: No edema, warm Neuro: Alert and oriented  Assessment & Plan:  Judy Evans was seen today for follow-up med problems.  Diagnoses and all orders for this visit:  COPD with acute exacerbation ([place]) Much improved Still slight wheeze with coughing Stopped albuterol, use albuterol TID next few days  Vitamin D deficiency Started on replacement, take for 8 weeks then take daily vit D  Psoriasis Follows with dermatology encouraged pt to restart kenalog BID  Follow up plan: Return if symptoms worsen or  fail to improve. [name], MD [place]

## 2017-01-30 NOTE — Patient Instructions (Signed)
Sinus rinses 2-3 times a day  Albuterol 3 times a day for the next few days  Let me know if not improving

## 2017-02-01 DIAGNOSIS — M25552 Pain in left hip: Secondary | ICD-10-CM | POA: Diagnosis not present

## 2017-02-01 DIAGNOSIS — M79672 Pain in left foot: Secondary | ICD-10-CM | POA: Diagnosis not present

## 2017-02-09 ENCOUNTER — Telehealth: Payer: Self-pay | Admitting: Pediatrics

## 2017-02-09 NOTE — Telephone Encounter (Signed)
Please contact the patient to DC inhaler. Contact Dr. Evette Doffing early next week for replacement. Follow up in off [place] ASAP if sx are not controllable with rest, fluids & OTC meds

## 2017-02-09 NOTE — Telephone Encounter (Signed)
Patient called stating that she has had nausea and diarrhea for 1 week. Abdominal cramps.  No Vomiting, No Fever.  Since starting Anoro inhaler

## 2017-02-09 NOTE — Telephone Encounter (Signed)
Patient aware of recommendations.  

## 2017-02-09 NOTE — Telephone Encounter (Signed)
What symptoms do you have? Nausea, tired. Thinks it is meds for pneumonia she was put on.  How long have you been sick? week  Have you been seen for this problem? yes  If your provider decides to give you a prescription, which pharmacy would you like for it to be sent to? [facility] [place].   Patient informed that this information will be sent to the clinical staff for review and that they should receive a follow up call.

## 2017-02-12 ENCOUNTER — Telehealth: Payer: Self-pay | Admitting: Pediatrics

## 2017-02-12 NOTE — Telephone Encounter (Signed)
Patient aware of appt with Dr. Evette Doffing.  Patient states that GI symptoms are better, but she is having cough and congestion(green-yellowish Mucus), chills, no known fevers.

## 2017-02-12 NOTE — Telephone Encounter (Signed)
Tried to contact pt, let VM. Has upcoming appt with me 5/30. Let me know if GI symptoms havent resolved.

## 2017-02-21 ENCOUNTER — Encounter: Payer: Self-pay | Admitting: Pediatrics

## 2017-02-21 ENCOUNTER — Telehealth: Payer: Self-pay | Admitting: Pediatrics

## 2017-02-21 ENCOUNTER — Ambulatory Visit (INDEPENDENT_AMBULATORY_CARE_PROVIDER_SITE_OTHER): Payer: Medicare HMO | Admitting: Pediatrics

## 2017-02-21 VITALS — BP 121/83 | HR 86 | Temp 98.4°F | Ht 62.0 in | Wt 223.0 lb

## 2017-02-21 DIAGNOSIS — R69 Illness, unspecified: Secondary | ICD-10-CM | POA: Diagnosis not present

## 2017-02-21 DIAGNOSIS — R7303 Prediabetes: Secondary | ICD-10-CM

## 2017-02-21 DIAGNOSIS — Z720 Tobacco use: Secondary | ICD-10-CM | POA: Diagnosis not present

## 2017-02-21 DIAGNOSIS — F32A Depression, unspecified: Secondary | ICD-10-CM

## 2017-02-21 DIAGNOSIS — F329 Major depressive disorder, single episode, unspecified: Secondary | ICD-10-CM

## 2017-02-21 DIAGNOSIS — J449 Chronic obstructive pulmonary disease, unspecified: Secondary | ICD-10-CM | POA: Diagnosis not present

## 2017-02-21 DIAGNOSIS — J309 Allergic rhinitis, unspecified: Secondary | ICD-10-CM | POA: Diagnosis not present

## 2017-02-21 MED ORDER — ALBUTEROL SULFATE HFA 108 (90 BASE) MCG/ACT IN AERS: INHALATION_SPRAY | RESPIRATORY_TRACT | 5 refills | Status: DC

## 2017-02-21 MED ORDER — PREDNISONE 10 MG (21) PO TBPK: ORAL_TABLET | Freq: Every day | ORAL | 0 refills | Status: DC

## 2017-02-21 MED ORDER — FLUTICASONE PROPIONATE 50 MCG/ACT NA SUSP: 2.0000 | Freq: Every day | NASAL | 0 refills | Status: DC

## 2017-02-21 MED ORDER — CETIRIZINE HCL 10 MG PO TABS: ORAL_TABLET | ORAL | 8 refills | Status: DC

## 2017-02-21 NOTE — Progress Notes (Signed)
  Subjective:   Patient ID: DEMISHA NOKES, female    DOB: Jan 01, 1971, 46 y.o.   MRN: [id number] CC: 4 week recheck  HPI: JAIME DOME is a 46 y.o. female presenting for 4 week recheck  When throat dry she starts coughing a lot Still coughing up some phelgm Using albuterol 1-2 times a day S/p prednisone, levofloxacin 1 month ago Not using anoro bc had some GI symptoms when she first started Also was started on prednisone at the same time, however, and levofloxacin for COPD exacerbation Pt isnt sure which medication caused symptoms  Not using psoriasis topical steroids, planning on restarting Back this morning from trip to daughter five hours away, ongoing stress in her fmaily that keeps pt worried Pt feels safe at home, no thoughts of self harm Doesn't want to restart any medications for depression now cymbalta made her feel worse  Smoking daily Not ready to quit  No history of diabetes, does have pre-diabetes, avoiding sugar  Ongoing allergy symptoms, out of medications  Relevant past medical, surgical, family and social history reviewed. Allergies and medications reviewed and updated. History  Smoking Status  . Current Every Day Smoker  . Packs/day: 1.50  . Years: 30.00  . Types: Cigarettes  Smokeless Tobacco  . Never Used   ROS: Per HPI   Objective:    BP 121/83   Pulse 86   Temp 98.4 F (36.9 C) (Oral)   Ht 5\' 2"  (1.575 m)   Wt 223 lb (101.2 kg)   BMI 40.79 kg/m   Wt Readings from Last 3 Encounters:  02/21/17 223 lb (101.2 kg)  01/30/17 224 lb 9.6 oz (101.9 kg)  01/22/17 219 lb 9.6 oz (99.6 kg)   Gen: NAD, alert, cooperative with exam, NCAT EYES: EOMI, no conjunctival injection, or no icterus ENT:  TMs pearly gray b/l, OP without erythema LYMPH: no cervical LAD CV: NRRR, normal S1/S2, no murmur, distal pulses 2+ b/l Resp: no wheeze at rest, wheezing throughout with forced exhalation, no crackles, normal WOB Abd: +BS, soft, NTND.  Ext: No  edema, warm Neuro: Alert and oriented MSK: normal muscle bulk Skin: psoriasis plaques b/l arms extensor surfaces, ant shins, feet  Assessment & Plan:  Maryelizabeth was seen today for 4 week recheck.  Diagnoses and all orders for this visit:  Allergic rhinitis, unspecified seasonality, unspecified trigger -     fluticasone (FLONASE) 50 MCG/ACT nasal spray; Place 2 sprays into both nostrils daily. -     cetirizine (ZYRTEC) 10 MG tablet; TAKE 1 TABLET (10 MG TOTAL) BY MOUTH DAILY.  Chronic obstructive pulmonary disease, unspecified COPD type ([place]) Wheezing today, using labuteorl daily Prednisone taper Then restart anoro -     albuterol (VENTOLIN HFA) 108 (90 Base) MCG/ACT inhaler; INHALE 2 PUFFS INTO THE LUNGS EVERY 4 (FOUR) HOURS AS NEEDED FOR WHEEZING OR SHORTNESS OF BREATH. -     predniSONE (STERAPRED UNI-PAK 21 TAB) 10 MG (21) TBPK tablet; Take by mouth daily. As directed x 6 days  Pre-diabetes Cont to avoid sugar Recheck A1c in future  Depression, unspecified depression type Stable, still with some symptoms Worries a lot about her children Not interested in medication changes at this time Feels safe at home, will let me know if anything worsens  Tobacco use Cont to encourage cessation Pt wants to but says not able to right now with stress  Follow up plan: Return in about 2 months (around 04/23/2017). [name], MD [place]

## 2017-02-21 NOTE — Telephone Encounter (Signed)
Cetirizine is not covered by insurance. Please advise.

## 2017-02-21 NOTE — Telephone Encounter (Signed)
Can try any OTC allergy medication, generic is fine (fexofenadine, loratadine, cetirizine), antihistamines are often not covered by insurance because they are OTC

## 2017-02-21 NOTE — Telephone Encounter (Signed)
lmtcb

## 2017-03-13 ENCOUNTER — Ambulatory Visit (INDEPENDENT_AMBULATORY_CARE_PROVIDER_SITE_OTHER): Payer: Medicare HMO | Admitting: Neurology

## 2017-03-13 ENCOUNTER — Encounter: Payer: Self-pay | Admitting: Neurology

## 2017-03-13 VITALS — BP 117/77 | HR 82 | Resp 18 | Ht 62.0 in | Wt 224.0 lb

## 2017-03-13 DIAGNOSIS — M797 Fibromyalgia: Secondary | ICD-10-CM

## 2017-03-13 DIAGNOSIS — M25512 Pain in left shoulder: Secondary | ICD-10-CM | POA: Diagnosis not present

## 2017-03-13 DIAGNOSIS — G5603 Carpal tunnel syndrome, bilateral upper limbs: Secondary | ICD-10-CM

## 2017-03-13 DIAGNOSIS — IMO0002 Reserved for concepts with insufficient information to code with codable children: Secondary | ICD-10-CM

## 2017-03-13 DIAGNOSIS — G43709 Chronic migraine without aura, not intractable, without status migrainosus: Secondary | ICD-10-CM | POA: Diagnosis not present

## 2017-03-13 DIAGNOSIS — M7062 Trochanteric bursitis, left hip: Secondary | ICD-10-CM | POA: Insufficient documentation

## 2017-03-13 MED ORDER — AMITRIPTYLINE HCL 25 MG PO TABS: ORAL_TABLET | ORAL | 11 refills | Status: DC

## 2017-03-13 NOTE — Progress Notes (Signed)
[facility]  PATIENT: Judy Evans DOB: 24-Nov-1970  REFERRING DOCTOR OR PCP:  Dr. Evette Doffing SOURCE: Patient, notes from Dr. Evette Doffing imaging reports, MRI images on PACS.  _________________________________   HISTORICAL  CHIEF COMPLAINT:  Chief Complaint  Patient presents with  . Migraine    Sts. h/a's are less frequent and less severe since she restarted Amitriptyline.  Sts. she continues to have brief intermittent episodes of sharp pain right side of head, that are separate from the h/a's she has.  She continues to have neck pain and left shoulder pain (inj. given at last ov helped for about a week.)  She sees Dr. Francesco Runner for pain mx.  She has cancelled cervical esi with Dr. Francesco Runner twice due to illness--will call him back to reschedule/fim  . Neck Pain  . Fibromyalgia    HISTORY OF PRESENT ILLNESS:  Judy Evans is a 46 yo woman with 2 different types of headaches.   She also has shoulder pain and neck pain.  Her more constant headache and migraines are better on amitriptyline but she continues to get the sharp pain at times that is felt mostly in the right temple.    She tolerates the amitriptyline well.   HA History:   She has had headaches off and on x many years but they worsened 4-5 years ago. She had three especially bad headaches associated with difficulty speaking.   She saw a neurologist at [facility].   She was placed on amitriptyline with some benefit.   However, when she moved, her new PCP would not right amitriptyline for her.   She was referred to a pain management practice.    When they occur, she often gets sharp pains on the right.   Pain is stabbing.   She gets nausea but rarely has vomiting.   She has had no furhter migraine with aphasia.  Moving increases her pain.   Bright lights and noises increases her pain.  Amitriptyline has helped the headaches Triptans help sometimes but not always.   Tylenol arthritis helps a little bit.   .  FMS/neck  pain/LBP:  She still notes the fibromyalgia pain, mostly in the back, neck and hips.     She also has left hip bursitis and at times it seems like the leg is going to give out.     She is on Cymbalta and gabapentin (800 mg po qid) with some benefit.   She is also on Dilaudid 2 mg qid with benfit.  She has had lumbar spinal injections with benefit.    She sees Dr. Francesco Runner for Pain Management [facility]) for pain management.        Shoulder pain:   The left shoulder pain did better withteh subacromial bursa injection but benefit was only about 2 weeks. Pain increases with elevation and external rotation.    I personally reviewed an MRI of the cervical spine dated 07/14/2016. At C3-C4, she has a disc osteophyte complex causing moderate left foraminal narrowing with some encroachment upon the left C4 nerve root. At C5-C6 she has mild left foraminal narrowing due to a small disc osteophyte complex. There does not appear to be any nerve root compression.  REVIEW OF SYSTEMS: Constitutional: No fevers, chills, sweats, or change in appetite Eyes: No visual changes, double vision, eye pain Ear, nose and throat: No hearing loss, ear pain, nasal congestion, sore throat Cardiovascular: No chest pain, palpitations Respiratory: No shortness of breath at rest or with exertion.  No wheezes GastrointestinaI: has Irritable bowel Genitourinary: No dysuria, urinary retention or frequency.  No nocturia. Musculoskeletal: as above, fibromyalgia/neck pain/ shoulder pain/back pain Integumentary: Has psoriasis Neurological: as above Psychiatric: No depression at this time.  No anxiety Endocrine: has pre-diabetesNo palpitations, diaphoresis, change in appetite, change in weigh or increased thirst Hematologic/Lymphatic: No anemia, purpura, petechiae. Allergic/Immunologic: No itchy/runny eyes, nasal congestion, recent allergic reactions, rashes  ALLERGIES: Allergies  Allergen Reactions  . Rosuvastatin Calcium  Swelling    Ends up in the hospital  . Statins Swelling    Ends up in the hospital  . Hctz [Hydrochlorothiazide] Nausea And Vomiting and Rash  . Hydrocodone-Acetaminophen Rash  . Oxycodone Rash  . Penicillins Nausea And Vomiting and Rash    Has patient had a PCN reaction causing immediate rash, facial/tongue/throat swelling, SOB or lightheadedness with hypotension: Yes Has patient had a PCN reaction causing severe rash involving mucus membranes or skin necrosis: No Has patient had a PCN reaction that required hospitalization No Has patient had a PCN reaction occurring within the last 10 years: No If all of the above answers are "NO", then may proceed with Cephalosporin use.     HOME MEDICATIONS:  Current Outpatient Prescriptions:  .  acetaminophen (TYLENOL) 500 MG tablet, Take 1,000 mg by mouth every 6 (six) hours as needed for mild pain., Disp: , Rfl:  .  albuterol (VENTOLIN HFA) 108 (90 Base) MCG/ACT inhaler, INHALE 2 PUFFS INTO THE LUNGS EVERY 4 (FOUR) HOURS AS NEEDED FOR WHEEZING OR SHORTNESS OF BREATH., Disp: 18 Inhaler, Rfl: 5 .  amitriptyline (ELAVIL) 25 MG tablet, Take two at bedtime, Disp: 60 tablet, Rfl: 11 .  cetirizine (ZYRTEC) 10 MG tablet, TAKE 1 TABLET (10 MG TOTAL) BY MOUTH DAILY., Disp: 30 tablet, Rfl: 8 .  diclofenac sodium (VOLTAREN) 1 % GEL, Apply 4 g topically 4 (four) times daily. (Patient taking differently: Apply 4 g topically 4 (four) times daily as needed (pain). ), Disp: 100 g, Rfl: 2 .  fluticasone (FLONASE) 50 MCG/ACT nasal spray, Place 2 sprays into both nostrils daily., Disp: 16 g, Rfl: 0 .  gabapentin (NEURONTIN) 800 MG tablet, Take 800 mg by mouth 4 (four) times daily., Disp: , Rfl:  .  HYDROmorphone (DILAUDID) 2 MG tablet, Take 2 mg by mouth 4 (four) times daily. , Disp: , Rfl: 0 .  ibuprofen (ADVIL,MOTRIN) 600 MG tablet, Take 1 tablet (600 mg total) by mouth every 8 (eight) hours as needed., Disp: 15 tablet, Rfl: 0 .  lidocaine (LIDODERM) 5 %, PLACE 1  PATCH ONTO THE SKIN DAILY. REMOVE & DISCARD PATCH WITHIN 12 HOURS OR AS DIRECTED BY MD, Disp: 30 patch, Rfl: 2 .  naloxegol oxalate (MOVANTIK) 25 MG TABS tablet, Take 1 tablet (25 mg total) by mouth daily. Take one hour before or 2 hours after a meal., Disp: 30 tablet, Rfl: 5 .  omeprazole (PRILOSEC) 20 MG capsule, TAKE 1 CAPSULE (20 MG TOTAL) BY MOUTH DAILY., Disp: 90 capsule, Rfl: 1 .  Spacer/Aero Chamber Mouthpiece MISC, 1 each by Does not apply route every 6 (six) hours as needed., Disp: 1 each, Rfl: 0 .  SUMAtriptan (IMITREX) 100 MG tablet, Take 1 tablet (100 mg total) by mouth once as needed for migraine. May repeat in 2 hours if headache persists or recurs., Disp: 10 tablet, Rfl: 5 .  triamcinolone lotion (KENALOG) 0.1 %, Apply 1 application topically 2 (two) times daily., Disp: , Rfl:  .  umeclidinium-vilanterol (ANORO ELLIPTA) 62.5-25 MCG/INH AEPB, Inhale 1  puff into the lungs daily., Disp: 1 each, Rfl: 2 .  Vitamin D, Ergocalciferol, (DRISDOL) 50000 units CAPS capsule, Take 1 capsule (50,000 Units total) by mouth every 7 (seven) days., Disp: 8 capsule, Rfl: 0 .  predniSONE (STERAPRED UNI-PAK 21 TAB) 10 MG (21) TBPK tablet, Take by mouth daily. As directed x 6 days (Patient not taking: Reported on 03/13/2017), Disp: 21 tablet, Rfl: 0  PAST MEDICAL HISTORY: Past Medical History:  Diagnosis Date  . Anxiety   . Aphasia   . Arthritis   . COPD (chronic obstructive pulmonary disease) ([place])   . DDD (degenerative disc disease), cervical   . DDD (degenerative disc disease), lumbar   . Degenerative disc disease at L5-S1 level   . Diabetes mellitus without complication ([place])    States she does not have  . Fibromyalgia   . GERD (gastroesophageal reflux disease)   . H. pylori infection   . Hyperlipidemia   . Hypertension    States she does not have  . IBS (irritable bowel syndrome)   . LLQ pain 06/18/2015  . Migraines   . Moody 01/22/2015  . Pain with urination 02/19/2015  . Psoriasis   .  Scoliosis   . Spondylolysis     PAST SURGICAL HISTORY: Past Surgical History:  Procedure Laterality Date  . ABDOMINAL HYSTERECTOMY    . BIOPSY  09/14/2016   Procedure: BIOPSY;  Surgeon: Daneil Dolin, MD;  Location: [facility];  Service: Endoscopy;;  gastric   . CARDIAC CATHETERIZATION  2015   patient reported it to be normal. "I was dying in pain during procedure".   . CARPAL TUNNEL RELEASE Right   . CESAREAN SECTION    . CHOLECYSTECTOMY    . COLONOSCOPY  06/2014   Altamese Dilling DeMason: normal  . DILATION AND CURETTAGE OF UTERUS    . ESOPHAGOGASTRODUODENOSCOPY  07/2014   Burke Keels: Normal  . ESOPHAGOGASTRODUODENOSCOPY (EGD) WITH PROPOFOL N/A 09/14/2016   Dr. Gala Romney: Normal esophagus status post empiric dilation, erosive gastropathy with biopsies showing chronic inactive gastritis, no H pylori.  Marland Kitchen FOOT SURGERY Bilateral    removal of heel spurs  . MALONEY DILATION  09/14/2016   Procedure: Venia Minks DILATION;  Surgeon: Daneil Dolin, MD;  Location: [facility];  Service: Endoscopy;;  . rotator cuff surgery Right   . TONSILLECTOMY      FAMILY HISTORY: Family History  Problem Relation Age of Onset  . COPD Mother   . Diabetes Mother   . Osteoarthritis Mother   . Healthy Father   . Lupus Sister   . CAD Sister   . Diabetes Sister   . Early death Brother        pneumonia  . Asthma Daughter   . Asthma Son   . Cancer Maternal Grandmother 60       colon  . Arthritis Maternal Grandmother   . Diabetes Maternal Grandmother   . Dementia Maternal Grandmother   . Other Maternal Grandfather        brain tumor  . Cancer Paternal Grandmother        breast  . Cancer Paternal Grandfather        lung  . Other Paternal Grandfather        brain aneurysm  . Factor V Leiden deficiency Sister     SOCIAL HISTORY:  Social History   Social History  . Marital status: Married    Spouse name: N/A  . Number of children: N/A  . Years of  education: N/A   Occupational History  . Not  on file.   Social History Main Topics  . Smoking status: Current Every Day Smoker    Packs/day: 1.50    Years: 30.00    Types: Cigarettes  . Smokeless tobacco: Never Used  . Alcohol use No  . Drug use: No  . Sexual activity: Not Currently    Birth control/ protection: Surgical     Comment: hyst   Other Topics Concern  . Not on file   Social History Narrative  . No narrative on file     PHYSICAL EXAM  Vitals:   03/13/17 1120  BP: 117/77  Pulse: 82  Resp: 18  Weight: 224 lb (101.6 kg)  Height: 5\' 2"  (1.575 m)    Body mass index is 40.97 kg/m.   General: The patient is well-developed and well-nourished and in no acute distress   Neck: The neck is supple, no carotid bruits are noted.  The neck is mildly tender with good ROM   Musculoskeletal:  There is mild tenderness at the  splenius capitis muscles/occipital nerves.   She has mild (better) tenderness over the left subacromial bursa .   ROM is better.   She has tenderness over many of the classic fibromyalgia tender points and the left trochanteric bursa.  .   Neurologic Exam  Mental status: The patient is alert and oriented x 3 at the time of the examination. The patient has apparent normal recent and remote memory, with an apparently normal attention span and concentration ability.   Speech is normal.  Cranial nerves: Extraocular muscles are intact. Pupils reactive to light and accommodation. Facial strength and sensation is normal.   The tongue is midline, and the patient has symmetric elevation of the soft palate. No obvious hearing deficits are noted.  Motor:  Muscle bulk is normal.   Muscle tone is normal. Strength is 5/5 in the arms or legs..   Sensory: Sensory testing is intact to pinprick, soft touch and vibration sensation in all 4 extremities.  Coordination: Cerebellar testing reveals good finger-nose-finger bilaterally.  Gait and station: Station is normal.   Gait and tandem gait are normal. The Romberg  sign is negative.  Reflexes: Deep tendon reflexes are symmetric and normal bilaterally.        DIAGNOSTIC DATA (LABS, IMAGING, TESTING) - I reviewed patient records, labs, notes, testing and imaging myself where available.  Lab Results  Component Value Date   WBC 6.5 07/07/2016   HGB 15.8 (H) 07/07/2016   HCT 46.9 (H) 07/07/2016   MCV 90.7 07/07/2016   PLT 235 07/07/2016      Component Value Date/Time   NA 140 01/22/2017 1057   K 4.2 01/22/2017 1057   CL 100 01/22/2017 1057   CO2 26 01/22/2017 1057   GLUCOSE 124 (H) 01/22/2017 1057   GLUCOSE 98 07/07/2016 1525   BUN 6 01/22/2017 1057   CREATININE 0.67 01/22/2017 1057   CREATININE 0.76 07/07/2016 1525   CALCIUM 9.2 01/22/2017 1057   PROT 6.2 01/22/2017 1057   ALBUMIN 4.0 01/22/2017 1057   AST 20 01/22/2017 1057   ALT 19 01/22/2017 1057   ALKPHOS 127 (H) 01/22/2017 1057   BILITOT 0.5 01/22/2017 1057   GFRNONAA 106 01/22/2017 1057   GFRAA 123 01/22/2017 1057   Lab Results  Component Value Date   CHOL 199 01/22/2017   HDL 46 01/22/2017   LDLCALC 116 (H) 01/22/2017   TRIG 187 (H) 01/22/2017   CHOLHDL  4.3 01/22/2017   Lab Results  Component Value Date   HGBA1C 5.6 11/01/2015   Lab Results  Component Value Date   MGQQPYPP50 932 09/15/2016   Lab Results  Component Value Date   TSH 0.94 07/07/2016       ASSESSMENT AND PLAN  Fibromyalgia  Chronic migraine  Trochanteric bursitis of left hip  Left shoulder pain, unspecified chronicity  Bilateral carpal tunnel syndrome   1.   Increase amitriptyline to 25 mg x 2 at night 2.    Left trochanteric bursa injection with 40 mg Depo-Medrol in [place] using sterile technique.   3.    Return to see me in 4 months and call sooner if she has significant worsening of pain She will continue to see pain management for her back and neck pain.    Harlin Mazzoni A. Felecia Shelling, MD, PhD 6/71/2458, 09:98 PM Certified in Neurology, Clinical Neurophysiology, Sleep Medicine, Pain  Medicine and Neuroimaging  [facility] [address], [place], [place] [address] [phone]

## 2017-03-15 ENCOUNTER — Other Ambulatory Visit: Payer: Self-pay | Admitting: *Deleted

## 2017-03-15 NOTE — Telephone Encounter (Signed)
Patient no longer needs Vit D 50000.  Patient was to only take for 8 weeks then switch to 1000-200 IU of Vit D

## 2017-03-20 NOTE — Telephone Encounter (Signed)
Spoke with patient and she was able to get medication.

## 2017-03-21 ENCOUNTER — Ambulatory Visit (INDEPENDENT_AMBULATORY_CARE_PROVIDER_SITE_OTHER): Payer: Medicare HMO | Admitting: Pediatrics

## 2017-03-21 ENCOUNTER — Ambulatory Visit (INDEPENDENT_AMBULATORY_CARE_PROVIDER_SITE_OTHER): Payer: Medicare HMO

## 2017-03-21 ENCOUNTER — Encounter: Payer: Self-pay | Admitting: Pediatrics

## 2017-03-21 VITALS — BP 139/89 | HR 94 | Temp 99.3°F | Ht 62.0 in | Wt 223.6 lb

## 2017-03-21 DIAGNOSIS — K5900 Constipation, unspecified: Secondary | ICD-10-CM

## 2017-03-21 DIAGNOSIS — R1012 Left upper quadrant pain: Secondary | ICD-10-CM

## 2017-03-21 DIAGNOSIS — F329 Major depressive disorder, single episode, unspecified: Secondary | ICD-10-CM

## 2017-03-21 DIAGNOSIS — R69 Illness, unspecified: Secondary | ICD-10-CM | POA: Diagnosis not present

## 2017-03-21 DIAGNOSIS — F32A Depression, unspecified: Secondary | ICD-10-CM

## 2017-03-21 MED ORDER — LINACLOTIDE 145 MCG PO CAPS: 145.0000 ug | ORAL_CAPSULE | Freq: Every day | ORAL | 3 refills | Status: DC

## 2017-03-21 MED ORDER — VENLAFAXINE HCL 37.5 MG PO TABS: 37.5000 mg | ORAL_TABLET | Freq: Two times a day (BID) | ORAL | 3 refills | Status: DC

## 2017-03-21 NOTE — Progress Notes (Signed)
  Subjective:   Patient ID: Judy Evans, female    DOB: 12-04-1970, 46 y.o.   MRN: [id number] CC: Lisbeth Renshaw and Hot Flashes  HPI: Judy Evans is a 46 y.o. female presenting for [facility] and [place] has been down Feels like she can't handle people now cymbalta most recent medication tried, made symptoms worse, felt on edge, fidgety seroquel helped some symptoms but made her very spaced out Wants to start something [place] child in NICU  Hot flashes come out of the blue, happening daily  Some abd pain last few days Not passing stool daily On chronic narcotics through pain management  Depression screen [facility] 2/9 03/21/2017 02/21/2017 01/30/2017 01/22/2017 10/23/2016  Decreased Interest 3 1 0 0 0  Down, Depressed, Hopeless 1 1 0 0 0  PHQ - 2 Score 4 2 0 0 0  Altered sleeping 1 1 - - -  Tired, decreased energy 1 1 - - -  Change in appetite 0 1 - - -  Feeling bad or failure about yourself  0 0 - - -  Trouble concentrating 0 0 - - -  Moving slowly or fidgety/restless 1 0 - - -  Suicidal thoughts 0 0 - - -  PHQ-9 Score 7 5 - - -  Difficult doing work/chores Somewhat difficult - - - -     Relevant past medical, surgical, family and social history reviewed. Allergies and medications reviewed and updated. History  Smoking Status  . Current Every Day Smoker  . Packs/day: 1.50  . Years: 30.00  . Types: Cigarettes  Smokeless Tobacco  . Never Used   ROS: Per HPI   Objective:    BP 139/89   Pulse 94   Temp 99.3 F (37.4 C) (Oral)   Ht 5\' 2"  (1.575 m)   Wt 223 lb 9.6 oz (101.4 kg)   BMI 40.90 kg/m   Wt Readings from Last 3 Encounters:  03/21/17 223 lb 9.6 oz (101.4 kg)  03/13/17 224 lb (101.6 kg)  02/21/17 223 lb (101.2 kg)    Gen: NAD, alert, cooperative with exam, NCAT EYES: EOMI, no conjunctival injection, or no icterus LYMPH: no cervical LAD CV: NRRR, normal S1/S2, no murmur Resp: CTABL, no wheezes, normal WOB Abd: +BS, soft, mildly ttp throughout, ND. No  guarding or rebound Ext: No edema, warm Neuro: Alert and oriented, strength equal b/l UE and LE, coordination grossly normal MSK: normal muscle bulk Psych: tearful at times  Assessment & Plan:  Judy Evans was seen today for moody and hot flashes.  Diagnoses and all orders for this visit:  Left upper quadrant pain -     DG Abd 1 View; Future  Constipation, unspecified constipation type Not on bowel regimen now, on chronic narcotics Start below, goal daily stool -     linaclotide (LINZESS) 145 MCG CAPS capsule; Take 1 capsule (145 mcg total) by mouth daily before breakfast.  Depression, unspecified depression type Hot flashes Symptomatic, feels safe at home, increased stress Start below -     venlafaxine (EFFEXOR) 37.5 MG tablet; Take 1 tablet (37.5 mg total) by mouth 2 (two) times daily.   Follow up plan: Return in about 2 months (around 05/21/2017) for med follow up. [name], MD [place]

## 2017-03-22 DIAGNOSIS — F329 Major depressive disorder, single episode, unspecified: Secondary | ICD-10-CM | POA: Insufficient documentation

## 2017-03-22 DIAGNOSIS — F32A Depression, unspecified: Secondary | ICD-10-CM | POA: Insufficient documentation

## 2017-03-29 ENCOUNTER — Other Ambulatory Visit: Payer: Self-pay | Admitting: Pediatrics

## 2017-03-29 DIAGNOSIS — E559 Vitamin D deficiency, unspecified: Secondary | ICD-10-CM

## 2017-04-06 ENCOUNTER — Encounter: Payer: Self-pay | Admitting: *Deleted

## 2017-04-24 ENCOUNTER — Ambulatory Visit: Payer: Medicare HMO | Admitting: Pediatrics

## 2017-04-30 ENCOUNTER — Encounter: Payer: Self-pay | Admitting: Gastroenterology

## 2017-04-30 ENCOUNTER — Ambulatory Visit (INDEPENDENT_AMBULATORY_CARE_PROVIDER_SITE_OTHER): Payer: Medicare HMO | Admitting: Gastroenterology

## 2017-04-30 VITALS — BP 132/77 | HR 87 | Temp 98.1°F | Ht 62.0 in | Wt 221.4 lb

## 2017-04-30 DIAGNOSIS — K219 Gastro-esophageal reflux disease without esophagitis: Secondary | ICD-10-CM | POA: Diagnosis not present

## 2017-04-30 DIAGNOSIS — K5903 Drug induced constipation: Secondary | ICD-10-CM | POA: Insufficient documentation

## 2017-04-30 MED ORDER — LUBIPROSTONE 24 MCG PO CAPS: 24.0000 ug | ORAL_CAPSULE | Freq: Two times a day (BID) | ORAL | 0 refills | Status: DC

## 2017-04-30 NOTE — Progress Notes (Signed)
Please make sure patient is aware that I wanted her to stop Movantik and try Amitiza by itself.

## 2017-04-30 NOTE — Assessment & Plan Note (Signed)
Patient reports Movantik not as effective as before. Developed explosive diarrhea on Linzess 152mcg daily. Change gears and tried Amitiza 24 g twice a day with food. Warned of potential side effect of nausea initially especially if she takes it on empty stomach. Samples provided. She'll call me know how she does. Once we determine appropriate medication/dose we will provide her with a prescription. She will discontinue Movantik.

## 2017-04-30 NOTE — Progress Notes (Signed)
Primary Care Physician: Eustaquio Maize, MD  Primary Gastroenterologist:  Garfield Cornea, MD   Chief Complaint  Patient presents with  . Gastroesophageal Reflux    HPI: Judy Evans is a 46 y.o. female here for six-month follow-up. Last seen back in February. History of GERD, left upper quadrant pain, epigastric pain, alternating constipation and diarrhea. When I last saw her she was having good BM's on Movantik, 2-3 times daily. Her GERD was well controlled.  She says few weeks ago her PCP started her on Linzess 139mcg for left-sided abdominal pain. States she had abdominal film showed increased stool load. After starting Linzess, she had explosive BMs. She had continued Movantik initially but then stopped it. Explosive stools continued so about 3 days ago she stopped Linzess and restarted Movantik. Feels like stools are inadequate. She notes left-sided abdominal pain did resolve after starting Linzess. At this time has not developed any further abdominal pain.   Chronically on omeprazole, for the most part controls her reflux. She does have issues with nausea first the morning after taking her medications. Some require taking on an empty stomach. Typically eats prior to taking her pain medication. Only new medication changes besides Linzess is Effexor. Denies recent antibiotic use.      Current Outpatient Prescriptions  Medication Sig Dispense Refill  . acetaminophen (TYLENOL) 500 MG tablet Take 1,000 mg by mouth every 6 (six) hours as needed for mild pain.    Marland Kitchen albuterol (VENTOLIN HFA) 108 (90 Base) MCG/ACT inhaler INHALE 2 PUFFS INTO THE LUNGS EVERY 4 (FOUR) HOURS AS NEEDED FOR WHEEZING OR SHORTNESS OF BREATH. 18 Inhaler 5  . amitriptyline (ELAVIL) 25 MG tablet Take two at bedtime 60 tablet 11  . cetirizine (ZYRTEC) 10 MG tablet TAKE 1 TABLET (10 MG TOTAL) BY MOUTH DAILY. 30 tablet 8  . diclofenac sodium (VOLTAREN) 1 % GEL Apply 4 g topically 4 (four) times daily.  (Patient taking differently: Apply 4 g topically 4 (four) times daily as needed (pain). ) 100 g 2  . fluticasone (FLONASE) 50 MCG/ACT nasal spray Place 2 sprays into both nostrils daily. 16 g 0  . gabapentin (NEURONTIN) 800 MG tablet Take 800 mg by mouth 4 (four) times daily.    Marland Kitchen HYDROmorphone (DILAUDID) 2 MG tablet Take 2 mg by mouth 4 (four) times daily.   0  . ibuprofen (ADVIL,MOTRIN) 600 MG tablet Take 1 tablet (600 mg total) by mouth every 8 (eight) hours as needed. 15 tablet 0  . lidocaine (LIDODERM) 5 % PLACE 1 PATCH ONTO THE SKIN DAILY. REMOVE & DISCARD PATCH WITHIN 12 HOURS OR AS DIRECTED BY MD 30 patch 2  . naloxegol oxalate (MOVANTIK) 25 MG TABS tablet Take 1 tablet (25 mg total) by mouth daily. Take one hour before or 2 hours after a meal. 30 tablet 5  . omeprazole (PRILOSEC) 20 MG capsule TAKE 1 CAPSULE (20 MG TOTAL) BY MOUTH DAILY. 90 capsule 1  . Spacer/Aero Chamber Mouthpiece MISC 1 each by Does not apply route every 6 (six) hours as needed. 1 each 0  . SUMAtriptan (IMITREX) 100 MG tablet Take 1 tablet (100 mg total) by mouth once as needed for migraine. May repeat in 2 hours if headache persists or recurs. 10 tablet 5  . triamcinolone lotion (KENALOG) 0.1 % Apply 1 application topically 2 (two) times daily.    Marland Kitchen umeclidinium-vilanterol (ANORO ELLIPTA) 62.5-25 MCG/INH AEPB Inhale 1 puff into the lungs daily. 1 each 2  .  venlafaxine (EFFEXOR) 37.5 MG tablet Take 1 tablet (37.5 mg total) by mouth 2 (two) times daily. 60 tablet 3  . Vitamin D, Ergocalciferol, (DRISDOL) 50000 units CAPS capsule TAKE 1 CAPSULE (50,000 UNITS TOTAL) BY MOUTH EVERY 7 (SEVEN) DAYS. 12 capsule 0   No current facility-administered medications for this visit.     Allergies as of 04/30/2017 - Review Complete 04/30/2017  Allergen Reaction Noted  . Rosuvastatin calcium Swelling 11/25/2014  . Statins Swelling 11/25/2014  . Hctz [hydrochlorothiazide] Nausea And Vomiting and Rash 01/12/2014  .  Hydrocodone-acetaminophen Rash 04/13/2016  . Oxycodone Rash 04/13/2016  . Penicillins Nausea And Vomiting and Rash 01/12/2014    ROS:  General: Negative for anorexia, weight loss, fever, chills, fatigue, weakness. ENT: Negative for hoarseness, difficulty swallowing , nasal congestion. CV: Negative for chest pain, angina, palpitations, dyspnea on exertion, peripheral edema.  Respiratory: Negative for dyspnea at rest, dyspnea on exertion, cough, sputum, wheezing.  GI: See history of present illness. GU:  Negative for dysuria, hematuria, urinary incontinence, urinary frequency, nocturnal urination.  Endo: Negative for unusual weight change.    Physical Examination:   BP 132/77   Pulse 87   Temp 98.1 F (36.7 C) (Oral)   Ht 5\' 2"  (1.575 m)   Wt 221 lb 6.4 oz (100.4 kg)   BMI 40.49 kg/m   General: Well-nourished, well-developed in no acute distress.  Eyes: No icterus. Mouth: Oropharyngeal mucosa moist and pink , no lesions erythema or exudate. Lungs: Clear to auscultation bilaterally.  Heart: Regular rate and rhythm, no murmurs rubs or gallops.  Abdomen: Bowel sounds are normal, nontender, nondistended, no hepatosplenomegaly or masses, no abdominal bruits or hernia , no rebound or guarding.   Extremities: No lower extremity edema. No clubbing or deformities. Neuro: Alert and oriented x 4   Skin: Warm and dry, no jaundice.   Psych: Alert and cooperative, normal mood and affect.

## 2017-04-30 NOTE — Progress Notes (Signed)
Pt said she was already aware to do so.

## 2017-04-30 NOTE — Patient Instructions (Signed)
1. Try Amitiza 33mcg twice daily with food for constipation. Samples provided. Call and let me know if it works well or we need to make adjustments. We will call in RX once we determine appropriate dose for you.

## 2017-05-01 NOTE — Progress Notes (Signed)
cc'ed to pcp °

## 2017-05-09 ENCOUNTER — Other Ambulatory Visit: Payer: Self-pay | Admitting: Pediatrics

## 2017-05-09 ENCOUNTER — Other Ambulatory Visit: Payer: Self-pay | Admitting: Internal Medicine

## 2017-05-15 ENCOUNTER — Ambulatory Visit (INDEPENDENT_AMBULATORY_CARE_PROVIDER_SITE_OTHER): Payer: Medicare HMO | Admitting: Family

## 2017-05-15 ENCOUNTER — Telehealth: Payer: Self-pay | Admitting: Internal Medicine

## 2017-05-15 ENCOUNTER — Encounter: Payer: Self-pay | Admitting: Family

## 2017-05-15 VITALS — BP 147/91 | HR 75 | Temp 97.4°F | Ht 62.0 in | Wt 221.0 lb

## 2017-05-15 DIAGNOSIS — F17200 Nicotine dependence, unspecified, uncomplicated: Secondary | ICD-10-CM | POA: Diagnosis not present

## 2017-05-15 DIAGNOSIS — J0100 Acute maxillary sinusitis, unspecified: Secondary | ICD-10-CM | POA: Diagnosis not present

## 2017-05-15 DIAGNOSIS — R69 Illness, unspecified: Secondary | ICD-10-CM | POA: Diagnosis not present

## 2017-05-15 MED ORDER — LUBIPROSTONE 24 MCG PO CAPS: 24.0000 ug | ORAL_CAPSULE | Freq: Two times a day (BID) | ORAL | 3 refills | Status: DC

## 2017-05-15 MED ORDER — DOXYCYCLINE HYCLATE 100 MG PO TABS: 100.0000 mg | ORAL_TABLET | Freq: Two times a day (BID) | ORAL | 0 refills | Status: DC

## 2017-05-15 NOTE — Telephone Encounter (Signed)
Rx send to pharmacy as per request. Please notify the patient.

## 2017-05-15 NOTE — Addendum Note (Signed)
Addended by: Gordy Levan, ERIC A on: 05/15/2017 11:32 AM   Modules accepted: Orders

## 2017-05-15 NOTE — Patient Instructions (Signed)

## 2017-05-15 NOTE — Progress Notes (Signed)
   Subjective:    Patient ID: Judy Evans, female    DOB: 09-20-1971, 46 y.o.   MRN: [id number]  Sinusitis  This is a new problem. The current episode started in the past 7 days. The problem has been gradually worsening since onset. There has been no fever. Her pain is at a severity of 8/10. The pain is moderate. Associated symptoms include chills, congestion, coughing, ear pain, headaches, a hoarse voice, sinus pressure, sneezing and a sore throat. Pertinent negatives include no neck pain, shortness of breath or swollen glands. Past treatments include acetaminophen, saline sprays, oral decongestants and spray decongestants. The treatment provided mild relief.      Review of Systems  Constitutional: Positive for chills.  HENT: Positive for congestion, ear pain, hoarse voice, sinus pressure, sneezing and sore throat.   Respiratory: Positive for cough. Negative for shortness of breath.   Musculoskeletal: Negative for neck pain.  Neurological: Positive for headaches.  All other systems reviewed and are negative.      Objective:   Physical Exam  Constitutional: She is oriented to person, place, and time. She appears well-developed and well-nourished. No distress.  HENT:  Head: Normocephalic and atraumatic.  Right Ear: External ear normal.  Left Ear: External ear normal.  Nose: Mucosal edema and rhinorrhea present. Right sinus exhibits maxillary sinus tenderness. Left sinus exhibits maxillary sinus tenderness.  Mouth/Throat: Posterior oropharyngeal erythema present.  Eyes: Pupils are equal, round, and reactive to light.  Neck: Normal range of motion. Neck supple. No thyromegaly present.  Cardiovascular: Normal rate, regular rhythm, normal heart sounds and intact distal pulses.   No murmur heard. Pulmonary/Chest: Effort normal and breath sounds normal. No respiratory distress. She has no wheezes.  Abdominal: Soft. Bowel sounds are normal. She exhibits no distension. There is no  tenderness.  Musculoskeletal: Normal range of motion. She exhibits no edema or tenderness.  Neurological: She is alert and oriented to person, place, and time.  Psychiatric: She has a normal mood and affect. Her behavior is normal. Judgment and thought content normal.  Vitals reviewed.   BP (!) 147/91   Pulse 75   Temp (!) 97.4 F (36.3 C) (Oral)   Ht 5\' 2"  (1.575 m)   Wt 221 lb (100.2 kg)   BMI 40.42 kg/m      Assessment & Plan:  1. Acute maxillary sinusitis, recurrence not specified - Take meds as prescribed - Use a cool mist humidifier  -Use saline nose sprays frequently -Saline irrigations of the nose can be very helpful if done frequently.  * 4X daily for 1 week*  * Use of a nettie pot can be helpful with this. Follow directions with this* -Force fluids -For any cough or congestion  Use plain Mucinex- regular strength or max strength is fine   * Children- consult with Pharmacist for dosing -For fever or aces or pains- take tylenol or ibuprofen appropriate for age and weight.  * for fevers greater than 101 orally you may alternate ibuprofen and tylenol every  3 hours. -Throat lozenges if help -New toothbrush in 3 days - doxycycline (VIBRA-TABS) 100 MG tablet; Take 1 tablet (100 mg total) by mouth 2 (two) times daily.  Dispense: 20 tablet; Refill: 0  2. Current smoker Smoking cessation discussed   Evelina Dun, FNP

## 2017-05-15 NOTE — Telephone Encounter (Signed)
Patient called and stated that the [place] samples are working and needs [facility] [place], [phone]

## 2017-05-15 NOTE — Telephone Encounter (Signed)
Routing to the refill box. 

## 2017-05-16 NOTE — Telephone Encounter (Signed)
Tried to call pt-LM that rx was sent to the pharmacy.

## 2017-05-19 DIAGNOSIS — M5481 Occipital neuralgia: Secondary | ICD-10-CM | POA: Diagnosis not present

## 2017-05-19 DIAGNOSIS — Z79899 Other long term (current) drug therapy: Secondary | ICD-10-CM | POA: Diagnosis not present

## 2017-05-19 DIAGNOSIS — J449 Chronic obstructive pulmonary disease, unspecified: Secondary | ICD-10-CM | POA: Diagnosis not present

## 2017-05-19 DIAGNOSIS — M1990 Unspecified osteoarthritis, unspecified site: Secondary | ICD-10-CM | POA: Diagnosis not present

## 2017-05-19 DIAGNOSIS — G8929 Other chronic pain: Secondary | ICD-10-CM | POA: Diagnosis not present

## 2017-05-19 DIAGNOSIS — R69 Illness, unspecified: Secondary | ICD-10-CM | POA: Diagnosis not present

## 2017-05-19 DIAGNOSIS — M797 Fibromyalgia: Secondary | ICD-10-CM | POA: Diagnosis not present

## 2017-05-19 DIAGNOSIS — R51 Headache: Secondary | ICD-10-CM | POA: Diagnosis not present

## 2017-05-23 ENCOUNTER — Ambulatory Visit: Payer: Medicare HMO | Admitting: Pediatrics

## 2017-05-24 ENCOUNTER — Ambulatory Visit (INDEPENDENT_AMBULATORY_CARE_PROVIDER_SITE_OTHER): Payer: Medicare HMO | Admitting: Pediatrics

## 2017-05-24 ENCOUNTER — Encounter: Payer: Self-pay | Admitting: Pediatrics

## 2017-05-24 VITALS — BP 139/88 | HR 81 | Temp 98.3°F | Ht 62.0 in | Wt 220.8 lb

## 2017-05-24 DIAGNOSIS — J309 Allergic rhinitis, unspecified: Secondary | ICD-10-CM

## 2017-05-24 DIAGNOSIS — L409 Psoriasis, unspecified: Secondary | ICD-10-CM

## 2017-05-24 DIAGNOSIS — J449 Chronic obstructive pulmonary disease, unspecified: Secondary | ICD-10-CM | POA: Diagnosis not present

## 2017-05-24 DIAGNOSIS — K5903 Drug induced constipation: Secondary | ICD-10-CM | POA: Diagnosis not present

## 2017-05-24 DIAGNOSIS — M797 Fibromyalgia: Secondary | ICD-10-CM | POA: Diagnosis not present

## 2017-05-24 DIAGNOSIS — M1990 Unspecified osteoarthritis, unspecified site: Secondary | ICD-10-CM | POA: Diagnosis not present

## 2017-05-24 MED ORDER — FLUTICASONE PROPIONATE 50 MCG/ACT NA SUSP: 2.0000 | Freq: Every day | NASAL | 6 refills | Status: DC

## 2017-05-24 NOTE — Progress Notes (Signed)
  Subjective:   Patient ID: Judy Evans, female    DOB: 1971/02/27, 46 y.o.   MRN: [id number] CC: Follow-up (2 month) med problems  HPI: Judy Evans is a 46 y.o. female presenting for Follow-up (2 month)  Psoriasis: following with derm Has been using creams regularly Plaques slightly smoother, still bother her regularly  Chronic pain: pain clinic closed Pt referred to alternate, had appt with someone at their clinic not a pain specialist  COPD: breathing has been fine No wheezing recently  Relevant past medical, surgical, family and social history reviewed. Allergies and medications reviewed and updated. History  Smoking Status  . Current Every Day Smoker  . Packs/day: 1.50  . Years: 30.00  . Types: Cigarettes  Smokeless Tobacco  . Never Used   ROS: Per HPI   Objective:    BP 139/88   Pulse 81   Temp 98.3 F (36.8 C) (Oral)   Ht 5\' 2"  (1.575 m)   Wt 220 lb 12.8 oz (100.2 kg)   BMI 40.38 kg/m   Wt Readings from Last 3 Encounters:  05/24/17 220 lb 12.8 oz (100.2 kg)  05/15/17 221 lb (100.2 kg)  04/30/17 221 lb 6.4 oz (100.4 kg)     Gen: NAD, alert, cooperative with exam, NCAT EYES: EOMI, no conjunctival injection, or no icterus ENT:  TMs pearly gray b/l, OP without erythema LYMPH: no cervical LAD CV: NRRR, normal S1/S2, no murmur, distal pulses 2+ b/l Resp: CTABL, no wheezes, normal WOB Abd: +BS, soft, NTND. no guarding or organomegaly Ext: No edema, warm Neuro: Alert and oriented, strength equal b/l UE and LE, coordination grossly normal MSK: normal muscle bulk Skin: red plaques with silvery scale 1cm, some coalescing, over LE  Assessment & Plan:  Kerrilyn was seen today for follow-up med problems  Diagnoses and all orders for this visit:  Allergic rhinitis, unspecified seasonality, unspecified trigger Stable, below helps -     fluticasone (FLONASE) 50 MCG/ACT nasal spray; Place 2 sprays into both nostrils daily.  Constipation due to pain  medication Improved with amitiza, cont  Psoriasis F/u with derm, congoing plaques  Arthritis Fibromyalgia Cont gabapentin, venlafaxine F/u with pain clinic, pt to call for appt, referral in   Chronic obstructive pulmonary disease, unspecified COPD type ([place]) Stable, not needed albuteorl recently  Follow up plan: Return in about 3 months (around 08/24/2017). [name], MD [place]

## 2017-06-08 DIAGNOSIS — R0602 Shortness of breath: Secondary | ICD-10-CM | POA: Diagnosis not present

## 2017-06-08 DIAGNOSIS — R5383 Other fatigue: Secondary | ICD-10-CM | POA: Diagnosis not present

## 2017-06-08 DIAGNOSIS — M129 Arthropathy, unspecified: Secondary | ICD-10-CM | POA: Diagnosis not present

## 2017-06-08 DIAGNOSIS — M25561 Pain in right knee: Secondary | ICD-10-CM | POA: Diagnosis not present

## 2017-06-11 DIAGNOSIS — R202 Paresthesia of skin: Secondary | ICD-10-CM | POA: Diagnosis not present

## 2017-06-11 DIAGNOSIS — R51 Headache: Secondary | ICD-10-CM | POA: Diagnosis not present

## 2017-06-14 ENCOUNTER — Ambulatory Visit (INDEPENDENT_AMBULATORY_CARE_PROVIDER_SITE_OTHER): Payer: Medicare HMO | Admitting: Family Medicine

## 2017-06-14 ENCOUNTER — Encounter: Payer: Self-pay | Admitting: Family Medicine

## 2017-06-14 VITALS — BP 133/84 | HR 78 | Temp 98.0°F | Ht 62.0 in | Wt 218.0 lb

## 2017-06-14 DIAGNOSIS — M545 Low back pain, unspecified: Secondary | ICD-10-CM

## 2017-06-14 DIAGNOSIS — R300 Dysuria: Secondary | ICD-10-CM | POA: Diagnosis not present

## 2017-06-14 DIAGNOSIS — Z23 Encounter for immunization: Secondary | ICD-10-CM

## 2017-06-14 LAB — URINALYSIS, COMPLETE
Bilirubin, UA: NEGATIVE
GLUCOSE, UA: NEGATIVE
Ketones, UA: NEGATIVE
Leukocytes, UA: NEGATIVE
NITRITE UA: NEGATIVE
PH UA: 5.5 (ref 5.0–7.5)
Protein, UA: NEGATIVE
RBC, UA: NEGATIVE
Specific Gravity, UA: <1.005 — ABNORMAL LOW (ref 1.005–1.030)
Urobilinogen, Ur: 0.2 mg/dL (ref 0.2–1.0)

## 2017-06-14 LAB — MICROSCOPIC EXAMINATION
RBC MICROSCOPIC, UA: NONE SEEN /HPF (ref 0–?)
RENAL EPITHEL UA: NONE SEEN /HPF
WBC UA: NONE SEEN /HPF (ref 0–?)

## 2017-06-14 MED ORDER — BACLOFEN 10 MG PO TABS: 10.0000 mg | ORAL_TABLET | Freq: Three times a day (TID) | ORAL | 0 refills | Status: DC

## 2017-06-14 NOTE — Progress Notes (Signed)
BP 133/84   Pulse 78   Temp 98 F (36.7 C) (Oral)   Ht 5\' 2"  (1.575 m)   Wt 218 lb (98.9 kg)   BMI 39.87 kg/m    Subjective:    Patient ID: Judy Evans, female    DOB: [birth year], 46 y.o.   MRN: [id number]  HPI: Judy Evans is a 46 y.o. female presenting on 06/14/2017 for Urinary Tract Infection (some dysuria last week with concentrated urine, back pain; unsure if it is UTI or she may have pulled a muscle)   HPI Low back pain Patient complains of low back pain that is worse on the right side than left. She says this feels like when she has had previous urinary tract infections and does not know if that could be related to it. She said she had some dysuria about a week ago and had some dark urine about a week ago but not since. She denies any fevers or chills or abdominal pain or nausea or vomiting or diarrhea or constipation. She has not noted any urinary frequency or vaginal symptoms such as discharge or pain or bleeding. She denies any nausea or vomiting.  Relevant past medical, surgical, family and social history reviewed and updated as indicated. Interim medical history since our last visit reviewed. Allergies and medications reviewed and updated.  Review of Systems  Constitutional: Negative for chills and fever.  Eyes: Negative for visual disturbance.  Respiratory: Negative for chest tightness and shortness of breath.   Cardiovascular: Negative for chest pain and leg swelling.  Gastrointestinal: Negative for abdominal pain, constipation, diarrhea, nausea and vomiting.  Genitourinary: Positive for dysuria and flank pain. Negative for difficulty urinating and frequency.  Musculoskeletal: Positive for back pain. Negative for gait problem.  Skin: Negative for color change and rash.  Neurological: Negative for light-headedness and headaches.  Psychiatric/Behavioral: Negative for agitation and behavioral problems.  All other systems reviewed and are negative.   Per  HPI unless specifically indicated above      Objective:    BP 133/84   Pulse 78   Temp 98 F (36.7 C) (Oral)   Ht 5\' 2"  (1.575 m)   Wt 218 lb (98.9 kg)   BMI 39.87 kg/m   Wt Readings from Last 3 Encounters:  06/14/17 218 lb (98.9 kg)  05/24/17 220 lb 12.8 oz (100.2 kg)  05/15/17 221 lb (100.2 kg)    Physical Exam  Constitutional: She is oriented to person, place, and time. She appears well-developed and well-nourished. No distress.  Eyes: Conjunctivae are normal.  Cardiovascular: Normal rate, regular rhythm, normal heart sounds and intact distal pulses.   No murmur heard. Pulmonary/Chest: Effort normal and breath sounds normal. No respiratory distress. She has no wheezes. She has no rales.  Abdominal: Soft. Bowel sounds are normal. She exhibits no distension. There is no tenderness. There is CVA tenderness (Right and left CVA tenderness, more on the right than left). There is no rigidity, no rebound and no guarding.  Musculoskeletal: Normal range of motion. She exhibits tenderness (Low back tenderness on right and left). She exhibits no edema.  Neurological: She is alert and oriented to person, place, and time. Coordination normal.  Skin: Skin is warm and dry. No rash noted. She is not diaphoretic.  Psychiatric: She has a normal mood and affect. Her behavior is normal.  Nursing note and vitals reviewed.   Urinalysis: 0-10 epithelial cells, few bacteria, otherwise negative    Assessment & Plan:  Problem List Items Addressed This Visit    None    Visit Diagnoses    Acute bilateral low back pain without sciatica    -  Primary   Recommended for her to continue her meloxicam and sent muscle relaxer.   Relevant Medications   meloxicam (MOBIC) 15 MG tablet   baclofen (LIORESAL) 10 MG tablet   Other Relevant Orders   Urinalysis, Complete   Urine Culture       Follow up plan: Return if symptoms worsen or fail to improve.  Counseling provided for all of the vaccine  components Orders Placed This Encounter  Procedures  . Urinalysis, Complete    Caryl Pina, MD [facility] 06/14/2017, 2:15 PM

## 2017-06-14 NOTE — Addendum Note (Signed)
Addended by: Michaela Corner on: 06/14/2017 03:08 PM   Modules accepted: Orders

## 2017-06-15 LAB — URINE CULTURE

## 2017-06-18 DIAGNOSIS — E669 Obesity, unspecified: Secondary | ICD-10-CM | POA: Diagnosis not present

## 2017-06-18 DIAGNOSIS — Z716 Tobacco abuse counseling: Secondary | ICD-10-CM | POA: Diagnosis not present

## 2017-06-18 DIAGNOSIS — R942 Abnormal results of pulmonary function studies: Secondary | ICD-10-CM | POA: Diagnosis not present

## 2017-06-18 DIAGNOSIS — Z87891 Personal history of nicotine dependence: Secondary | ICD-10-CM | POA: Diagnosis not present

## 2017-06-18 DIAGNOSIS — J449 Chronic obstructive pulmonary disease, unspecified: Secondary | ICD-10-CM | POA: Diagnosis not present

## 2017-06-19 DIAGNOSIS — Z87891 Personal history of nicotine dependence: Secondary | ICD-10-CM | POA: Diagnosis not present

## 2017-06-19 DIAGNOSIS — M549 Dorsalgia, unspecified: Secondary | ICD-10-CM | POA: Diagnosis not present

## 2017-06-19 DIAGNOSIS — R51 Headache: Secondary | ICD-10-CM | POA: Diagnosis not present

## 2017-06-19 DIAGNOSIS — G8929 Other chronic pain: Secondary | ICD-10-CM | POA: Diagnosis not present

## 2017-06-21 DIAGNOSIS — R202 Paresthesia of skin: Secondary | ICD-10-CM | POA: Diagnosis not present

## 2017-06-28 ENCOUNTER — Ambulatory Visit (INDEPENDENT_AMBULATORY_CARE_PROVIDER_SITE_OTHER): Payer: Medicare HMO | Admitting: Family Medicine

## 2017-06-28 ENCOUNTER — Encounter: Payer: Self-pay | Admitting: Family Medicine

## 2017-06-28 VITALS — BP 130/80 | HR 89 | Temp 99.8°F | Ht 62.0 in | Wt 223.0 lb

## 2017-06-28 DIAGNOSIS — J441 Chronic obstructive pulmonary disease with (acute) exacerbation: Secondary | ICD-10-CM

## 2017-06-28 MED ORDER — DOXYCYCLINE HYCLATE 100 MG PO TABS: 100.0000 mg | ORAL_TABLET | Freq: Two times a day (BID) | ORAL | 0 refills | Status: DC

## 2017-06-28 MED ORDER — PREDNISONE 20 MG PO TABS: ORAL_TABLET | ORAL | 0 refills | Status: DC

## 2017-06-28 NOTE — Progress Notes (Signed)
BP 130/80   Pulse 89   Temp 99.8 F (37.7 C) (Oral)   Ht 5\' 2"  (1.575 m)   Wt 223 lb (101.2 kg)   SpO2 96%   BMI 40.79 kg/m    Subjective:    Patient ID: Judy Evans, female    DOB: 07/21/1971, 46 y.o.   MRN: [id number]  HPI: Judy Evans is a 46 y.o. female presenting on 06/28/2017 for Sinusitis (sinus drainage, sinus pressure and pain, ears painful and stopped up, sore throat) and Cough/wheezing   HPI Sinus congestion and cough and wheezing Patient comes in with complaints of sinus congestion and drainage and sinus pressure and ear pressure and feels like her ears are stopped up. She also complains of some cough that is productive and some wheezing. She says this is been going on for the past few days. She has been using her inhaler which has helped some. She denies any fevers or chills. She denies any shortness of breath.  Relevant past medical, surgical, family and social history reviewed and updated as indicated. Interim medical history since our last visit reviewed. Allergies and medications reviewed and updated.  Review of Systems  Constitutional: Negative for chills and fever.  HENT: Positive for congestion, postnasal drip, rhinorrhea, sinus pressure, sneezing and sore throat. Negative for ear discharge and ear pain.   Eyes: Negative for pain, redness and visual disturbance.  Respiratory: Positive for cough and wheezing. Negative for chest tightness and shortness of breath.   Cardiovascular: Negative for chest pain and leg swelling.  Genitourinary: Negative for difficulty urinating and dysuria.  Musculoskeletal: Negative for back pain and gait problem.  Skin: Negative for rash.  Neurological: Negative for light-headedness and headaches.  Psychiatric/Behavioral: Negative for agitation and behavioral problems.  All other systems reviewed and are negative.   Per HPI unless specifically indicated above     Objective:    BP 130/80   Pulse 89   Temp 99.8  F (37.7 C) (Oral)   Ht 5\' 2"  (1.575 m)   Wt 223 lb (101.2 kg)   SpO2 96%   BMI 40.79 kg/m   Wt Readings from Last 3 Encounters:  06/28/17 223 lb (101.2 kg)  06/14/17 218 lb (98.9 kg)  05/24/17 220 lb 12.8 oz (100.2 kg)    Physical Exam  Constitutional: She is oriented to person, place, and time. She appears well-developed and well-nourished. No distress.  HENT:  Right Ear: Tympanic membrane, external ear and ear canal normal.  Left Ear: Tympanic membrane, external ear and ear canal normal.  Nose: Mucosal edema and rhinorrhea present. No epistaxis. Right sinus exhibits no maxillary sinus tenderness and no frontal sinus tenderness. Left sinus exhibits no maxillary sinus tenderness and no frontal sinus tenderness.  Mouth/Throat: Uvula is midline and mucous membranes are normal. Posterior oropharyngeal edema and posterior oropharyngeal erythema present. No oropharyngeal exudate or tonsillar abscesses.  Eyes: Conjunctivae and EOM are normal.  Neck: Neck supple. No thyromegaly present.  Cardiovascular: Normal rate, regular rhythm, normal heart sounds and intact distal pulses.   No murmur heard. Pulmonary/Chest: Effort normal. No respiratory distress. She has wheezes. She has no rales.  Musculoskeletal: Normal range of motion. She exhibits no edema or tenderness.  Lymphadenopathy:    She has no cervical adenopathy.  Neurological: She is alert and oriented to person, place, and time. Coordination normal.  Skin: Skin is warm and dry. No rash noted. She is not diaphoretic.  Psychiatric: She has a normal mood and affect.  Her behavior is normal.  Vitals reviewed.       Assessment & Plan:   Problem List Items Addressed This Visit    None    Visit Diagnoses    COPD exacerbation ([place])    -  Primary      Gave prednisone and doxycycline and continue albuterol inhalers  Follow up plan: Return if symptoms worsen or fail to improve.  Counseling provided for all of the vaccine  components No orders of the defined types were placed in this encounter.   Caryl Pina, MD [facility] 06/28/2017, 12:24 PM

## 2017-06-29 ENCOUNTER — Other Ambulatory Visit: Payer: Self-pay | Admitting: *Deleted

## 2017-06-29 ENCOUNTER — Telehealth: Payer: Self-pay | Admitting: Pediatrics

## 2017-06-29 DIAGNOSIS — J441 Chronic obstructive pulmonary disease with (acute) exacerbation: Secondary | ICD-10-CM

## 2017-06-29 MED ORDER — PREDNISONE 20 MG PO TABS: ORAL_TABLET | ORAL | 0 refills | Status: DC

## 2017-06-29 MED ORDER — DOXYCYCLINE HYCLATE 100 MG PO TABS: 100.0000 mg | ORAL_TABLET | Freq: Two times a day (BID) | ORAL | 0 refills | Status: DC

## 2017-06-29 NOTE — Telephone Encounter (Signed)
[facility]

## 2017-07-03 DIAGNOSIS — M549 Dorsalgia, unspecified: Secondary | ICD-10-CM | POA: Diagnosis not present

## 2017-07-03 DIAGNOSIS — G8929 Other chronic pain: Secondary | ICD-10-CM | POA: Diagnosis not present

## 2017-07-03 DIAGNOSIS — R768 Other specified abnormal immunological findings in serum: Secondary | ICD-10-CM | POA: Diagnosis not present

## 2017-07-03 DIAGNOSIS — R51 Headache: Secondary | ICD-10-CM | POA: Diagnosis not present

## 2017-07-10 DIAGNOSIS — L400 Psoriasis vulgaris: Secondary | ICD-10-CM | POA: Diagnosis not present

## 2017-07-10 DIAGNOSIS — Z79899 Other long term (current) drug therapy: Secondary | ICD-10-CM | POA: Diagnosis not present

## 2017-07-19 ENCOUNTER — Telehealth: Payer: Self-pay | Admitting: Pediatrics

## 2017-07-19 NOTE — Telephone Encounter (Signed)
Patient was given breo inhaler by the on call doctor one day and he told her if it worked for her to get in touch with her pcp and she would rx it for her. Also patient was going was going to [facility] for pain her and the doctor got into it and she told them she would not be back. Pt wants to be set up with another pain doctor. Also wants referral for RA. Please advise.

## 2017-07-19 NOTE — Telephone Encounter (Signed)
Left detailed message for pt regarding requests Instructed pt to call back and schedule appt with Dr Evette Doffing

## 2017-07-20 ENCOUNTER — Other Ambulatory Visit: Payer: Self-pay | Admitting: *Deleted

## 2017-07-20 DIAGNOSIS — E559 Vitamin D deficiency, unspecified: Secondary | ICD-10-CM

## 2017-07-20 MED ORDER — VITAMIN D (ERGOCALCIFEROL) 1.25 MG (50000 UNIT) PO CAPS: 50000.0000 [IU] | ORAL_CAPSULE | ORAL | 0 refills | Status: DC

## 2017-08-06 ENCOUNTER — Other Ambulatory Visit: Payer: Self-pay | Admitting: Pediatrics

## 2017-08-06 DIAGNOSIS — J449 Chronic obstructive pulmonary disease, unspecified: Secondary | ICD-10-CM

## 2017-08-17 ENCOUNTER — Ambulatory Visit (INDEPENDENT_AMBULATORY_CARE_PROVIDER_SITE_OTHER): Payer: Medicare HMO

## 2017-08-17 ENCOUNTER — Encounter: Payer: Self-pay | Admitting: Pediatrics

## 2017-08-17 ENCOUNTER — Ambulatory Visit (INDEPENDENT_AMBULATORY_CARE_PROVIDER_SITE_OTHER): Payer: Medicare HMO | Admitting: Pediatrics

## 2017-08-17 VITALS — BP 130/88 | HR 92 | Temp 97.2°F | Resp 20 | Ht 62.0 in | Wt 226.2 lb

## 2017-08-17 DIAGNOSIS — R05 Cough: Secondary | ICD-10-CM

## 2017-08-17 DIAGNOSIS — R059 Cough, unspecified: Secondary | ICD-10-CM

## 2017-08-17 DIAGNOSIS — J441 Chronic obstructive pulmonary disease with (acute) exacerbation: Secondary | ICD-10-CM | POA: Diagnosis not present

## 2017-08-17 DIAGNOSIS — S90934A Unspecified superficial injury of right lesser toe(s), initial encounter: Secondary | ICD-10-CM

## 2017-08-17 DIAGNOSIS — T148XXA Other injury of unspecified body region, initial encounter: Secondary | ICD-10-CM

## 2017-08-17 MED ORDER — PREDNISONE 20 MG PO TABS: ORAL_TABLET | ORAL | 0 refills | Status: DC

## 2017-08-17 MED ORDER — LEVOFLOXACIN 500 MG PO TABS: 500.0000 mg | ORAL_TABLET | Freq: Every day | ORAL | 0 refills | Status: DC

## 2017-08-17 MED ORDER — MUPIROCIN 2 % EX OINT: TOPICAL_OINTMENT | CUTANEOUS | 0 refills | Status: DC

## 2017-08-17 MED ORDER — FLUTICASONE FUROATE-VILANTEROL 200-25 MCG/INH IN AEPB: 1.0000 | INHALATION_SPRAY | Freq: Every day | RESPIRATORY_TRACT | 3 refills | Status: DC

## 2017-08-17 NOTE — Progress Notes (Signed)
  Subjective:   Patient ID: Judy Evans, female    DOB: 09-12-1971, 46 y.o.   MRN: [id number] CC: Cough and Nasal Congestion  HPI: Judy Evans is a 46 y.o. female presenting for Cough and Nasal Congestion  Coughing a lot more than usual Increased sputum Using albuterol TID Keeping cough drops around Taking tylenol cold and flu OTC, minimal improvement  Temp to 101.8 a couple of days ago No fever today so far Coughing has not improved  Smoking less bc it irritates her throat Wants to quit, pre-contemplative, has had lots of ongoing stress  Dropped something on her 4th toe a few days ago, caused some bleeding, still feels sore, small cut on top of skin, trying to keep clean  Relevant past medical, surgical, family and social history reviewed. Allergies and medications reviewed and updated. Social History   Tobacco Use  Smoking Status Current Every Day Smoker  . Packs/day: 1.50  . Years: 30.00  . Pack years: 45.00  . Types: Cigarettes  Smokeless Tobacco Never Used   ROS: Per HPI   Objective:    BP 130/88   Pulse 92   Temp (!) 97.2 F (36.2 C) (Oral)   Resp 20   Ht 5\' 2"  (1.575 m)   Wt 226 lb 3.2 oz (102.6 kg)   SpO2 97%   BMI 41.37 kg/m   Wt Readings from Last 3 Encounters:  08/17/17 226 lb 3.2 oz (102.6 kg)  06/28/17 223 lb (101.2 kg)  06/14/17 218 lb (98.9 kg)    Gen: NAD, alert, cooperative with exam, NCAT, warm EYES: EOMI, no conjunctival injection, or no icterus ENT:  TMs pearly gray b/l, OP without erythema LYMPH: no cervical LAD CV: NRRR, normal S1/S2, no murmur, distal pulses 2+ b/l Resp: inspiratory and expiratory wheezes, comfortable WOB, speaking in full sentences Abd: +BS, soft, NTND. no guarding or organomegaly Ext: No edema, warm Neuro: Alert and oriented Skin: 38mm abrasion dorsum of 4th R toe, minimal surrounding erythema, some yellow crust on surface  Assessment & Plan:  Prerana was seen today for cough and nasal  congestion.  Diagnoses and all orders for this visit:  COPD exacerbation ([place]) Change in sputum color, amount of coughing, feeling more SOB Will treat as exacerbation with abx and steroids Start breo Has f/u in 1 week with me -     levofloxacin (LEVAQUIN) 500 MG tablet; Take 1 tablet (500 mg total) by mouth daily. -     predniSONE (DELTASONE) 20 MG tablet; 2 po at same time daily for 5 days -     fluticasone furoate-vilanterol (BREO ELLIPTA) 200-25 MCG/INH AEPB; Inhale 1 puff into the lungs daily.  Cough Xray given fever Neg for infection -     DG Chest 2 View; Future  Abrasion Use below on cut on 4th toe, slowly healing -     mupirocin ointment (BACTROBAN) 2 %; Use twice a day on affected areas   Follow up plan: As scheduled [name], MD [place]

## 2017-08-24 ENCOUNTER — Encounter: Payer: Self-pay | Admitting: Pediatrics

## 2017-08-24 ENCOUNTER — Ambulatory Visit (INDEPENDENT_AMBULATORY_CARE_PROVIDER_SITE_OTHER): Payer: Medicare HMO | Admitting: Pediatrics

## 2017-08-24 VITALS — BP 126/79 | HR 99 | Temp 98.2°F | Ht 62.0 in | Wt 224.8 lb

## 2017-08-24 DIAGNOSIS — Z23 Encounter for immunization: Secondary | ICD-10-CM | POA: Diagnosis not present

## 2017-08-24 DIAGNOSIS — B370 Candidal stomatitis: Secondary | ICD-10-CM | POA: Diagnosis not present

## 2017-08-24 DIAGNOSIS — M2550 Pain in unspecified joint: Secondary | ICD-10-CM | POA: Diagnosis not present

## 2017-08-24 DIAGNOSIS — M545 Low back pain, unspecified: Secondary | ICD-10-CM

## 2017-08-24 DIAGNOSIS — G8929 Other chronic pain: Secondary | ICD-10-CM

## 2017-08-24 DIAGNOSIS — J449 Chronic obstructive pulmonary disease, unspecified: Secondary | ICD-10-CM | POA: Diagnosis not present

## 2017-08-24 DIAGNOSIS — L409 Psoriasis, unspecified: Secondary | ICD-10-CM | POA: Diagnosis not present

## 2017-08-24 MED ORDER — NYSTATIN 100000 UNIT/ML MT SUSP: 5.0000 mL | Freq: Four times a day (QID) | OROMUCOSAL | 0 refills | Status: DC

## 2017-08-24 NOTE — Progress Notes (Signed)
  Subjective:   Patient ID: Judy Evans, female    DOB: 10/11/1970, 46 y.o.   MRN: [id number] CC: Follow-up Multiple medical problems HPI: Judy Evans is a 46 y.o. female presenting for Follow-up  Psoriasis: using new cream foam, thinks drying out skin Following with dermatology Patient says they recommended she see a rheumatologist stepped up and treatment is topical strong steroids have not been working  Back pain: ongoing pain, going up to head and down back Was followed at pain clinic which is recently closed Would like referral to alternate location Has some headaches on R side of head Takes amitriptyline for HA, takes sumatriptan with headache, falls asleep then with improvement in headache  COPD: improved breathing with Breo Finish antibiotics and steroids  Has had elevated rheumatoid arthritis labs recently at [facility]   Relevant past medical, surgical, family and social history reviewed. Allergies and medications reviewed and updated. Social History   Tobacco Use  Smoking Status Current Every Day Smoker  . Packs/day: 1.50  . Years: 30.00  . Pack years: 45.00  . Types: Cigarettes  Smokeless Tobacco Never Used   ROS: Per HPI   Objective:    BP 126/79   Pulse 99   Temp 98.2 F (36.8 C) (Oral)   Ht 5\' 2"  (1.575 m)   Wt 224 lb 12.8 oz (102 kg)   BMI 41.12 kg/m   Wt Readings from Last 3 Encounters:  08/24/17 224 lb 12.8 oz (102 kg)  08/17/17 226 lb 3.2 oz (102.6 kg)  06/28/17 223 lb (101.2 kg)    Gen: NAD, alert, cooperative with exam, NCAT EYES: EOMI, no conjunctival injection, or no icterus ENT:  OP with mild erythema and white 1-6 mm plaques uvula and soft and hard palate LYMPH: no cervical LAD CV: NRRR, normal S1/S2, no murmur, distal pulses 2+ b/l Resp: CTABL, no wheezes, normal WOB Abd: +BS, soft, NTND. no guarding or organomegaly Ext: No edema, warm Neuro: Alert and oriented MSK: no swelling of hands or fingers, knees,  elbows Skin: Red plaques with white scale 1-3 cm over the dorsum of hands   Assessment & Plan:  [name] was seen today for follow-up multiple medical problems  Diagnoses and all orders for this visit:  Thrush Recently started back on Breo Discussed rinsing mouth out with every use -     nystatin (MYCOSTATIN) 100000 UNIT/ML suspension; Take 5 mLs (500,000 Units total) by mouth 4 (four) times daily.  Chronic obstructive pulmonary disease, unspecified COPD type ([place]) Improved symptoms on Breo, continue  Psoriasis -     Ambulatory referral to Rheumatology  Joint pain Per patient report had positive rheumatoid labs recently at a doctor's visit Has not had clinical symptoms including swelling in the joints Has had a history of fibromyalgia so has arthalgias throughout back, legs, neck, shoulders We will repeat blood work and request for records -     [place], IGG/IGA -     C-reactive protein -     Rheumatoid factor  Chronic midline low back pain without sciatica -     Ambulatory referral to Pain Clinic   Follow up plan: Return in about 3 months (around 11/22/2017). [name], MD [place]

## 2017-08-24 NOTE — Addendum Note (Signed)
Addended by: Wardell Heath on: 08/24/2017 12:24 PM   Modules accepted: Orders

## 2017-08-26 LAB — C-REACTIVE PROTEIN: CRP: 4.3 mg/L (ref 0.0–4.9)

## 2017-08-26 LAB — RHEUMATOID FACTOR: Rhuematoid fact SerPl-aCnc: <10 IU/mL (ref 0.0–13.9)

## 2017-08-26 LAB — CYCLIC CITRUL PEPTIDE ANTIBODY, IGG/IGA: Cyclic Citrullin Peptide Ab: 6 units (ref 0–19)

## 2017-10-03 DIAGNOSIS — M797 Fibromyalgia: Secondary | ICD-10-CM | POA: Diagnosis not present

## 2017-10-03 DIAGNOSIS — R5382 Chronic fatigue, unspecified: Secondary | ICD-10-CM | POA: Diagnosis not present

## 2017-10-03 DIAGNOSIS — M3500 Sicca syndrome, unspecified: Secondary | ICD-10-CM | POA: Diagnosis not present

## 2017-10-03 DIAGNOSIS — Z6841 Body Mass Index (BMI) 40.0 and over, adult: Secondary | ICD-10-CM | POA: Diagnosis not present

## 2017-10-03 DIAGNOSIS — G894 Chronic pain syndrome: Secondary | ICD-10-CM | POA: Diagnosis not present

## 2017-10-03 DIAGNOSIS — L409 Psoriasis, unspecified: Secondary | ICD-10-CM | POA: Diagnosis not present

## 2017-10-03 DIAGNOSIS — M2550 Pain in unspecified joint: Secondary | ICD-10-CM | POA: Diagnosis not present

## 2017-10-04 ENCOUNTER — Other Ambulatory Visit: Payer: Self-pay | Admitting: Nurse Practitioner

## 2017-10-17 ENCOUNTER — Other Ambulatory Visit: Payer: Self-pay | Admitting: Pediatrics

## 2017-10-17 DIAGNOSIS — E559 Vitamin D deficiency, unspecified: Secondary | ICD-10-CM

## 2017-10-17 NOTE — Telephone Encounter (Signed)
Pt does not need Rx strength anymore, will recheck next visit to see level. Should take daily OTC 1000iu to keep vitamin D from getting low again

## 2017-10-21 ENCOUNTER — Other Ambulatory Visit: Payer: Self-pay | Admitting: Pediatrics

## 2017-10-21 DIAGNOSIS — E559 Vitamin D deficiency, unspecified: Secondary | ICD-10-CM

## 2017-10-22 NOTE — Telephone Encounter (Signed)
Last Vit D 01/22/17  23.4

## 2017-11-19 ENCOUNTER — Other Ambulatory Visit: Payer: Self-pay

## 2017-11-19 ENCOUNTER — Telehealth: Payer: Self-pay

## 2017-11-19 DIAGNOSIS — K219 Gastro-esophageal reflux disease without esophagitis: Secondary | ICD-10-CM

## 2017-11-19 NOTE — Telephone Encounter (Signed)
[facility] [place] [facility] day refill of Omeprazole 40mg  po daily. (Omeprazole 20mg  was on med list)

## 2017-11-20 ENCOUNTER — Other Ambulatory Visit: Payer: Self-pay

## 2017-11-20 MED ORDER — AMITRIPTYLINE HCL 25 MG PO TABS: ORAL_TABLET | ORAL | 0 refills | Status: DC

## 2017-11-21 ENCOUNTER — Other Ambulatory Visit: Payer: Self-pay

## 2017-11-22 ENCOUNTER — Ambulatory Visit (INDEPENDENT_AMBULATORY_CARE_PROVIDER_SITE_OTHER): Payer: Medicare HMO | Admitting: Pediatrics

## 2017-11-22 ENCOUNTER — Encounter: Payer: Self-pay | Admitting: Pediatrics

## 2017-11-22 VITALS — BP 126/77 | HR 90 | Temp 97.9°F | Ht 62.0 in | Wt 224.0 lb

## 2017-11-22 DIAGNOSIS — I498 Other specified cardiac arrhythmias: Secondary | ICD-10-CM | POA: Diagnosis not present

## 2017-11-22 DIAGNOSIS — R69 Illness, unspecified: Secondary | ICD-10-CM | POA: Diagnosis not present

## 2017-11-22 DIAGNOSIS — F329 Major depressive disorder, single episode, unspecified: Secondary | ICD-10-CM

## 2017-11-22 DIAGNOSIS — M79662 Pain in left lower leg: Secondary | ICD-10-CM

## 2017-11-22 DIAGNOSIS — F32A Depression, unspecified: Secondary | ICD-10-CM

## 2017-11-22 MED ORDER — GABAPENTIN 800 MG PO TABS: 800.0000 mg | ORAL_TABLET | Freq: Four times a day (QID) | ORAL | 2 refills | Status: DC

## 2017-11-22 MED ORDER — VENLAFAXINE HCL 37.5 MG PO TABS: 37.5000 mg | ORAL_TABLET | Freq: Two times a day (BID) | ORAL | 3 refills | Status: DC

## 2017-11-22 MED ORDER — OMEPRAZOLE 20 MG PO CPDR: 20.0000 mg | DELAYED_RELEASE_CAPSULE | Freq: Every day | ORAL | 1 refills | Status: DC

## 2017-11-22 NOTE — Telephone Encounter (Signed)
Rx sent to pharmacy per patient/pharmacy request. I refilled the 20 mg, not sure where 40 mg came from. Maybe another provider?

## 2017-11-22 NOTE — Progress Notes (Signed)
  Subjective:   Patient ID: Judy Evans, female    DOB: May 08, 1971, 47 y.o.   MRN: [id number] CC: Follow-up (3 month); and Ear Pain, chest pain  HPI: Judy Evans is a 47 y.o. female presenting for Follow-up (3 month); chest pain  Both ears bothering her. Feels like a burning on the inside.   Mother in ICU at [facility] with respiratory and renal failure from flu past two weeks. Pt has been asked to be POA. Feeling stressed. Sleeping poorly.   Ongoing pain L lower leg. Would like referral to pain clinic.   Heart palpitations: has a fluttering feeling in her chest, lasts for several minutes at a time. Feels a pressure in her chest with the racing.   Current smoker.   Relevant past medical, surgical, family and social history reviewed. Allergies and medications reviewed and updated. Social History   Tobacco Use  Smoking Status Current Every Day Smoker  . Packs/day: 1.50  . Years: 30.00  . Pack years: 45.00  . Types: Cigarettes  Smokeless Tobacco Never Used   ROS: Per HPI   Objective:    BP 126/77   Pulse 90   Temp 97.9 F (36.6 C) (Oral)   Ht 5\' 2"  (1.575 m)   Wt 224 lb (101.6 kg)   BMI 40.97 kg/m   Wt Readings from Last 3 Encounters:  11/22/17 224 lb (101.6 kg)  08/24/17 224 lb 12.8 oz (102 kg)  08/17/17 226 lb 3.2 oz (102.6 kg)    Gen: NAD, alert, cooperative with exam, NCAT EYES: EOMI, no conjunctival injection, or no icterus ENT:  TMs pearly gray b/l, OP without erythema LYMPH: no cervical LAD CV: NRRR, normal S1/S2, no murmur, distal pulses 2+ b/l Resp: CTABL, no wheezes, normal WOB Abd: +BS, soft, NTND. no guarding or organomegaly Ext: No edema, warm Neuro: Alert and oriented, strength equal b/l UE and LE, coordination grossly normal MSK: normal muscle bulk  Assessment & Plan:  Judy Evans was seen today for follow-up medical problems.  Diagnoses and all orders for this visit:  Fluttering heart -     Ambulatory referral to Cardiology  Pain  of left lower leg -     gabapentin (NEURONTIN) 800 MG tablet; Take 1 tablet (800 mg total) by mouth 4 (four) times daily. -     Ambulatory referral to Pain Clinic  Depression, unspecified depression type Lots of stress with mom sick, start below. Referral to [facility]. -     venlafaxine (EFFEXOR) 37.5 MG tablet; Take 1 tablet (37.5 mg total) by mouth 2 (two) times daily.   Follow up plan: Return in about 1 month (around 12/20/2017). [name], MD [place]

## 2017-11-22 NOTE — Telephone Encounter (Signed)
Noted  

## 2017-11-24 ENCOUNTER — Encounter: Payer: Self-pay | Admitting: Pediatrics

## 2017-11-26 ENCOUNTER — Telehealth: Payer: Self-pay | Admitting: Pediatrics

## 2017-11-26 DIAGNOSIS — H6503 Acute serous otitis media, bilateral: Secondary | ICD-10-CM

## 2017-11-26 DIAGNOSIS — K219 Gastro-esophageal reflux disease without esophagitis: Secondary | ICD-10-CM

## 2017-11-26 MED ORDER — OMEPRAZOLE 40 MG PO CPDR: 40.0000 mg | DELAYED_RELEASE_CAPSULE | Freq: Every day | ORAL | 1 refills | Status: DC

## 2017-11-26 MED ORDER — AZITHROMYCIN 250 MG PO TABS: ORAL_TABLET | ORAL | 0 refills | Status: DC

## 2017-11-26 NOTE — Telephone Encounter (Signed)
I sent in omeprazole 40mg . Are her ears bothering her now? If not, she should not take anything, should improve. If she is having ear pain OK to start azithromycin. I sent that in to pharmacy. Do not start it unless having ear pain to treat AOM.

## 2017-11-26 NOTE — Telephone Encounter (Signed)
lmtcb

## 2017-11-26 NOTE — Telephone Encounter (Signed)
Please see below.  Looking at her medication history she was prescribed Omeprazole 40 mg on 05/09/17 for 30 days with 5 refills.  I do not see any mention in your office visit notes about her ears.  Please advise.

## 2017-12-17 ENCOUNTER — Telehealth: Payer: Self-pay | Admitting: Cardiovascular Disease

## 2017-12-17 ENCOUNTER — Encounter: Payer: Self-pay | Admitting: *Deleted

## 2017-12-17 ENCOUNTER — Encounter: Payer: Self-pay | Admitting: Cardiovascular Disease

## 2017-12-17 ENCOUNTER — Ambulatory Visit (INDEPENDENT_AMBULATORY_CARE_PROVIDER_SITE_OTHER): Payer: Medicare HMO | Admitting: Cardiovascular Disease

## 2017-12-17 VITALS — BP 119/75 | HR 87 | Ht 62.0 in | Wt 223.8 lb

## 2017-12-17 DIAGNOSIS — N644 Mastodynia: Secondary | ICD-10-CM

## 2017-12-17 DIAGNOSIS — R002 Palpitations: Secondary | ICD-10-CM | POA: Diagnosis not present

## 2017-12-17 DIAGNOSIS — R079 Chest pain, unspecified: Secondary | ICD-10-CM | POA: Diagnosis not present

## 2017-12-17 NOTE — Patient Instructions (Signed)
Medication Instructions:  Continue all current medications.  Labwork: none  Testing/Procedures:  Your physician has requested that you have an echocardiogram. Echocardiography is a painless test that uses sound waves to create images of your heart. It provides your doctor with information about the size and shape of your heart and how well your heart's chambers and valves are working. This procedure takes approximately one hour. There are no restrictions for this procedure.  Your physician has requested that you have en exercise stress myoview. For further information please visit [email or web address]. Please follow instruction sheet, as given.  Your physician has recommended that you wear a 7 day event monitor. Event monitors are medical devices that record the heart's electrical activity. Doctors most often [place] these monitors to diagnose arrhythmias. Arrhythmias are problems with the speed or rhythm of the heartbeat. The monitor is a small, portable device. You can wear one while you do your normal daily activities. This is usually used to diagnose what is causing palpitations/syncope (passing out).  Office will contact with results via phone or letter.    Follow-Up: 2 months   Any Other Special Instructions Will Be Listed Below (If Applicable).  If you need a refill on your cardiac medications before your next appointment, please call your pharmacy.

## 2017-12-17 NOTE — Progress Notes (Addendum)
CARDIOLOGY CONSULT NOTE  Patient ID: Judy Evans MRN: [id number] DOB/AGE: 46-Apr-1972 46 y.o.  Admit date: (Not on file) Primary Physician: Eustaquio Maize, MD Referring Physician: Dr. Evette Doffing  Reason for Consultation: Palpitations and chest pain  HPI: Judy Evans is a 47 y.o. female who is being seen today for the evaluation of palpitations and chest pain at the request of Eustaquio Maize, MD.  I reviewed notes from her PCP.  The patient appears to be undergoing a lot of stress as her mother has been ill.  Chest x-ray on 08/17/17 showed no active cardiopulmonary disease.  ECG performed in the office today which I ordered and personally interpreted demonstrates normal sinus rhythm with no ischemic ST segment or T-wave abnormalities, nor any arrhythmias.  She told me that her mother has been in the ICU at [facility] and has been on life support for at least a month.  For about the past month and a half, she has been experiencing left-sided chest pains with associated left arm numbness.  It can occur both at rest and with exertion.  It can occur in her sleep or when climbing stairs.  Symptoms last seconds and sometimes up to 5 minutes.  She also has some sharp retrosternal chest pains which are fleeting and last seconds.  She has occasional lightheadedness and dizziness but denies syncope.  She denies orthopnea and paroxysmal nocturnal dyspnea.  She said she underwent cardiac catheterization in 2014 at [facility] in [place].  I will have to obtain a copy of this report.  She said she developed a right groin hematoma afterwards.  Family history: Mother developed rapid atrial fibrillation while hospitalized this past month.  A sister had CABG in her late 24s and has several coronary artery stents.  She also has lupus and is a smoker.  Another sister has factor V Leiden deficiency.  She said she has a granddaughter with a heart  defect.    Allergies  Allergen Reactions  . Rosuvastatin Calcium Swelling    Ends up in the hospital  . Statins Swelling    Ends up in the hospital  . Wellbutrin [Bupropion]     suicidal  . Hctz [Hydrochlorothiazide] Nausea And Vomiting and Rash  . Hydrocodone-Acetaminophen Rash  . Oxycodone Rash  . Penicillins Nausea And Vomiting and Rash    Has patient had a PCN reaction causing immediate rash, facial/tongue/throat swelling, SOB or lightheadedness with hypotension: Yes Has patient had a PCN reaction causing severe rash involving mucus membranes or skin necrosis: No Has patient had a PCN reaction that required hospitalization No Has patient had a PCN reaction occurring within the last 10 years: No If all of the above answers are "NO", then may proceed with Cephalosporin use.     Current Outpatient Medications  Medication Sig Dispense Refill  . acetaminophen (TYLENOL) 500 MG tablet Take 1,000 mg by mouth every 6 (six) hours as needed for mild pain.    Marland Kitchen albuterol (PROVENTIL HFA;VENTOLIN HFA) 108 (90 Base) MCG/ACT inhaler INHALE 2 PUFFS INTO THE LUNGS EVERY 4 (FOUR) HOURS AS NEEDED FOR WHEEZING OR SHORTNESS OF BREATH. 18 Inhaler 4  . AMITIZA 24 MCG capsule TAKE 1 CAPSULE (24 MCG TOTAL) BY MOUTH 2 (TWO) TIMES DAILY WITH A MEAL. 60 capsule 5  . amitriptyline (ELAVIL) 25 MG tablet Take two at bedtime 180 tablet 0  . cetirizine (ZYRTEC) 10 MG tablet TAKE 1 TABLET (10 MG TOTAL)  BY MOUTH DAILY. 30 tablet 8  . diclofenac sodium (VOLTAREN) 1 % GEL Apply 4 g topically 4 (four) times daily. (Patient taking differently: Apply 4 g topically 4 (four) times daily as needed (pain). ) 100 g 2  . fluticasone (FLONASE) 50 MCG/ACT nasal spray Place 2 sprays into both nostrils daily. 16 g 6  . fluticasone furoate-vilanterol (BREO ELLIPTA) 200-25 MCG/INH AEPB Inhale 1 puff into the lungs daily. 1 each 3  . gabapentin (NEURONTIN) 800 MG tablet Take 1 tablet (800 mg total) by mouth 4 (four) times daily.  120 tablet 2  . ibuprofen (ADVIL,MOTRIN) 600 MG tablet Take 1 tablet (600 mg total) by mouth every 8 (eight) hours as needed. 15 tablet 0  . lidocaine (LIDODERM) 5 % PLACE 1 PATCH ONTO THE SKIN DAILY. REMOVE & DISCARD PATCH WITHIN 12 HOURS OR AS DIRECTED BY MD 30 patch 2  . mupirocin ointment (BACTROBAN) 2 % Use twice a day on affected areas 30 g 0  . nystatin ointment (MYCOSTATIN) APPLY 1 APPLICATION TOPICALLY 2 (TWO) TIMES DAILY AS NEEDED. 30 g 0  . omeprazole (PRILOSEC) 40 MG capsule Take 1 capsule (40 mg total) by mouth daily. 90 capsule 1  . SUMAtriptan (IMITREX) 100 MG tablet Take 1 tablet (100 mg total) by mouth once as needed for migraine. May repeat in 2 hours if headache persists or recurs. 10 tablet 5  . triamcinolone lotion (KENALOG) 0.1 % Apply 1 application topically 2 (two) times daily.    Marland Kitchen venlafaxine (EFFEXOR) 37.5 MG tablet Take 1 tablet (37.5 mg total) by mouth 2 (two) times daily. 60 tablet 3  . Vitamin D, Ergocalciferol, (DRISDOL) 50000 units CAPS capsule Take 1 capsule (50,000 Units total) by mouth every 7 (seven) days. 12 capsule 0   No current facility-administered medications for this visit.     Past Medical History:  Diagnosis Date  . Anxiety   . Aphasia   . Arthritis   . COPD (chronic obstructive pulmonary disease) ([place])   . DDD (degenerative disc disease), cervical   . DDD (degenerative disc disease), lumbar   . Degenerative disc disease at L5-S1 level   . Diabetes mellitus without complication ([place])    States she does not have  . Fibromyalgia   . GERD (gastroesophageal reflux disease)   . H. pylori infection   . Hyperlipidemia   . Hypertension    States she does not have  . IBS (irritable bowel syndrome)   . LLQ pain 06/18/2015  . Migraines   . [place] 01/22/2015  . Pain with urination 02/19/2015  . Psoriasis   . Scoliosis   . Spondylolysis     Past Surgical History:  Procedure Laterality Date  . ABDOMINAL HYSTERECTOMY    . BIOPSY  09/14/2016    Procedure: BIOPSY;  Surgeon: Daneil Dolin, MD;  Location: [facility];  Service: Endoscopy;;  gastric   . CARDIAC CATHETERIZATION  2015   patient reported it to be normal. "I was dying in pain during procedure".   . CARPAL TUNNEL RELEASE Right   . CESAREAN SECTION    . CHOLECYSTECTOMY    . COLONOSCOPY  06/2014   Altamese Dilling DeMason: normal  . DILATION AND CURETTAGE OF UTERUS    . ESOPHAGOGASTRODUODENOSCOPY  07/2014   Burke Keels: Normal  . ESOPHAGOGASTRODUODENOSCOPY (EGD) WITH PROPOFOL N/A 09/14/2016   Dr. Gala Romney: Normal esophagus status post empiric dilation, erosive gastropathy with biopsies showing chronic inactive gastritis, no H pylori.  Marland Kitchen FOOT SURGERY Bilateral  removal of heel spurs  . MALONEY DILATION  09/14/2016   Procedure: Venia Minks DILATION;  Surgeon: Daneil Dolin, MD;  Location: [facility];  Service: Endoscopy;;  . rotator cuff surgery Right   . TONSILLECTOMY      Social History   Socioeconomic History  . Marital status: Married    Spouse name: Not on file  . Number of children: Not on file  . Years of education: Not on file  . Highest education level: Not on file  Occupational History  . Not on file  Social Needs  . Financial resource strain: Not on file  . Food insecurity:    Worry: Not on file    Inability: Not on file  . Transportation needs:    Medical: Not on file    Non-medical: Not on file  Tobacco Use  . Smoking status: Current Every Day Smoker    Packs/day: 1.50    Years: 30.00    Pack years: 45.00    Types: Cigarettes  . Smokeless tobacco: Never Used  Substance and Sexual Activity  . Alcohol use: No  . Drug use: No  . Sexual activity: Not Currently    Birth control/protection: Surgical    Comment: hyst  Lifestyle  . Physical activity:    Days per week: Not on file    Minutes per session: Not on file  . Stress: Not on file  Relationships  . Social connections:    Talks on phone: Not on file    Gets together: Not on file    Attends  religious service: Not on file    Active member of club or organization: Not on file    Attends meetings of clubs or organizations: Not on file    Relationship status: Not on file  . Intimate partner violence:    Fear of current or ex partner: Not on file    Emotionally abused: Not on file    Physically abused: Not on file    Forced sexual activity: Not on file  Other Topics Concern  . Not on file  Social History Narrative  . Not on file      Current Meds  Medication Sig  . acetaminophen (TYLENOL) 500 MG tablet Take 1,000 mg by mouth every 6 (six) hours as needed for mild pain.  Marland Kitchen albuterol (PROVENTIL HFA;VENTOLIN HFA) 108 (90 Base) MCG/ACT inhaler INHALE 2 PUFFS INTO THE LUNGS EVERY 4 (FOUR) HOURS AS NEEDED FOR WHEEZING OR SHORTNESS OF BREATH.  Marland Kitchen AMITIZA 24 MCG capsule TAKE 1 CAPSULE (24 MCG TOTAL) BY MOUTH 2 (TWO) TIMES DAILY WITH A MEAL.  Marland Kitchen amitriptyline (ELAVIL) 25 MG tablet Take two at bedtime  . cetirizine (ZYRTEC) 10 MG tablet TAKE 1 TABLET (10 MG TOTAL) BY MOUTH DAILY.  Marland Kitchen diclofenac sodium (VOLTAREN) 1 % GEL Apply 4 g topically 4 (four) times daily. (Patient taking differently: Apply 4 g topically 4 (four) times daily as needed (pain). )  . fluticasone (FLONASE) 50 MCG/ACT nasal spray Place 2 sprays into both nostrils daily.  . fluticasone furoate-vilanterol (BREO ELLIPTA) 200-25 MCG/INH AEPB Inhale 1 puff into the lungs daily.  Marland Kitchen gabapentin (NEURONTIN) 800 MG tablet Take 1 tablet (800 mg total) by mouth 4 (four) times daily.  Marland Kitchen ibuprofen (ADVIL,MOTRIN) 600 MG tablet Take 1 tablet (600 mg total) by mouth every 8 (eight) hours as needed.  . lidocaine (LIDODERM) 5 % PLACE 1 PATCH ONTO THE SKIN DAILY. REMOVE & DISCARD PATCH WITHIN 12 HOURS OR AS DIRECTED  BY MD  . mupirocin ointment (BACTROBAN) 2 % Use twice a day on affected areas  . nystatin ointment (MYCOSTATIN) APPLY 1 APPLICATION TOPICALLY 2 (TWO) TIMES DAILY AS NEEDED.  Marland Kitchen omeprazole (PRILOSEC) 40 MG capsule Take 1 capsule  (40 mg total) by mouth daily.  . SUMAtriptan (IMITREX) 100 MG tablet Take 1 tablet (100 mg total) by mouth once as needed for migraine. May repeat in 2 hours if headache persists or recurs.  . triamcinolone lotion (KENALOG) 0.1 % Apply 1 application topically 2 (two) times daily.  Marland Kitchen venlafaxine (EFFEXOR) 37.5 MG tablet Take 1 tablet (37.5 mg total) by mouth 2 (two) times daily.  . Vitamin D, Ergocalciferol, (DRISDOL) 50000 units CAPS capsule Take 1 capsule (50,000 Units total) by mouth every 7 (seven) days.      Review of systems complete and found to be negative unless listed above in HPI    Physical exam Blood pressure 119/75, pulse 87, height 5\' 2"  (1.575 m), weight 223 lb 12.8 oz (101.5 kg), SpO2 95 %. General: NAD Neck: No JVD, no thyromegaly or thyroid nodule.  Lungs: Clear to auscultation bilaterally with normal respiratory effort. CV: Nondisplaced PMI. Regular rate and rhythm, normal S1/S2, no S3/S4, no murmur.  + Chest wall tenderness.  No peripheral edema.  No carotid bruit.   Abdomen: Soft, nontender, no distention.  Skin: Intact without lesions or rashes.  Neurologic: Alert and oriented x 3.  Psych: Normal affect. Extremities: No clubbing or cyanosis.  HEENT: Normal.   ECG: Most recent ECG reviewed.   Labs: Lab Results  Component Value Date/Time   K 4.2 01/22/2017 10:57 AM   BUN 6 01/22/2017 10:57 AM   CREATININE 0.67 01/22/2017 10:57 AM   CREATININE 0.76 07/07/2016 03:25 PM   ALT 19 01/22/2017 10:57 AM   TSH 0.94 07/07/2016 03:25 PM   HGB 15.8 (H) 07/07/2016 03:25 PM   HGB 15.4 11/01/2015 01:12 PM     Lipids: Lab Results  Component Value Date/Time   LDLCALC 116 (H) 01/22/2017 10:57 AM   CHOL 199 01/22/2017 10:57 AM   TRIG 187 (H) 01/22/2017 10:57 AM   HDL 46 01/22/2017 10:57 AM        ASSESSMENT AND PLAN:  1.  Chest pain: She has atypical and typical features creating a mixed picture.  There are both exertional and nonexertional symptoms.  She also  has some upper left-sided and right-sided chest wall tenderness.  Some of her symptoms may certainly be due to the increased amount of stress in her life. She has a family history of premature coronary artery disease and hypercoagulability. I will proceed with a nuclear myocardial perfusion imaging study to evaluate for ischemic heart disease (exercise Myoview). I will order a 2-D echocardiogram with Doppler to evaluate cardiac structure, function, and regional wall motion.  2.  Palpitations: While her symptoms may be due to significant life stressors, I want to make sure she is not having symptomatic tachyarrhythmias.  I will obtain a 1 week event monitor.  3.  Bilateral breast pain: She also has a family history of breast cancer and a mammogram is indicated given bilateral breast pain as well.   Disposition: Follow up in 2 months  Signed: Kate Sable, M.D., F.A.C.C.  12/17/2017, 9:38 AM

## 2017-12-17 NOTE — Telephone Encounter (Signed)
Pre-cert Verification for the following procedure Stress Cardiolite & Echo Scheduled for 12/24/2017  At [facility]

## 2017-12-17 NOTE — Addendum Note (Signed)
Addended by: Laurine Blazer on: 12/17/2017 10:11 AM   Modules accepted: Orders

## 2017-12-19 ENCOUNTER — Other Ambulatory Visit: Payer: Self-pay | Admitting: *Deleted

## 2017-12-19 DIAGNOSIS — F329 Major depressive disorder, single episode, unspecified: Secondary | ICD-10-CM

## 2017-12-19 DIAGNOSIS — F32A Depression, unspecified: Secondary | ICD-10-CM

## 2017-12-19 MED ORDER — VENLAFAXINE HCL 37.5 MG PO TABS: 37.5000 mg | ORAL_TABLET | Freq: Two times a day (BID) | ORAL | 0 refills | Status: DC

## 2017-12-20 ENCOUNTER — Ambulatory Visit (INDEPENDENT_AMBULATORY_CARE_PROVIDER_SITE_OTHER): Payer: Medicare HMO | Admitting: Pediatrics

## 2017-12-20 ENCOUNTER — Encounter: Payer: Self-pay | Admitting: Pediatrics

## 2017-12-20 DIAGNOSIS — F32A Depression, unspecified: Secondary | ICD-10-CM

## 2017-12-20 DIAGNOSIS — M79662 Pain in left lower leg: Secondary | ICD-10-CM

## 2017-12-20 DIAGNOSIS — R69 Illness, unspecified: Secondary | ICD-10-CM | POA: Diagnosis not present

## 2017-12-20 DIAGNOSIS — J309 Allergic rhinitis, unspecified: Secondary | ICD-10-CM | POA: Diagnosis not present

## 2017-12-20 DIAGNOSIS — F329 Major depressive disorder, single episode, unspecified: Secondary | ICD-10-CM | POA: Diagnosis not present

## 2017-12-20 MED ORDER — GABAPENTIN 800 MG PO TABS: 800.0000 mg | ORAL_TABLET | Freq: Four times a day (QID) | ORAL | 5 refills | Status: DC

## 2017-12-20 MED ORDER — FLUTICASONE PROPIONATE 50 MCG/ACT NA SUSP: 2.0000 | Freq: Every day | NASAL | 6 refills | Status: DC

## 2017-12-20 MED ORDER — VENLAFAXINE HCL 75 MG PO TABS: 75.0000 mg | ORAL_TABLET | Freq: Two times a day (BID) | ORAL | 5 refills | Status: DC

## 2017-12-20 NOTE — Progress Notes (Signed)
  Subjective:   Patient ID: Judy Evans, female    DOB: 03-30-1971, 47 y.o.   MRN: [id number] CC: Follow-up (1 month) Multiple medical problems. HPI: Judy Evans is a 47 y.o. female presenting for Follow-up (1 month)  Mother sick, has been taking care of her, increased stress at home.  Somewhat better compared to last visit.  Mood remains down.  She thinks the venlafaxine may been helping some.  Feels safe to self at home.  More runny nose for the last few days.  No fevers, sometimes itchy eyes.  Relevant past medical, surgical, family and social history reviewed. Allergies and medications reviewed and updated. Social History   Tobacco Use  Smoking Status Current Every Day Smoker  . Packs/day: 1.50  . Years: 30.00  . Pack years: 45.00  . Types: Cigarettes  Smokeless Tobacco Never Used   ROS: Per HPI   Objective:    BP 121/78   Pulse 78   Temp (!) 97.1 F (36.2 C) (Oral)   Ht 5\' 2"  (1.575 m)   Wt 226 lb 6.4 oz (102.7 kg)   BMI 41.41 kg/m   Wt Readings from Last 3 Encounters:  12/20/17 226 lb 6.4 oz (102.7 kg)  12/17/17 223 lb 12.8 oz (101.5 kg)  11/22/17 224 lb (101.6 kg)    Gen: NAD, alert, cooperative with exam, NCAT EYES: EOMI, no conjunctival injection, or no icterus ENT:  TMs pearly gray b/l, OP without erythema LYMPH: no cervical LAD CV: NRRR, normal S1/S2 Resp: CTABL, no wheezes, normal WOB Abd: +BS, soft, NTND. no guarding or organomegaly Ext: No edema, warm Neuro: Alert and oriented, strength equal b/l UE and LE, coordination grossly normal MSK: normal muscle bulk Psych: Normal affect.  No thoughts of self-harm.  Assessment & Plan:  Judy Evans was seen today for follow-up multiple medical problems.  Diagnoses and all orders for this visit:  Allergic rhinitis, unspecified seasonality, unspecified trigger Ongoing symptoms.  Start below. -     fluticasone (FLONASE) 50 MCG/ACT nasal spray; Place 2 sprays into both nostrils daily.  Pain of  left lower leg -     gabapentin (NEURONTIN) 800 MG tablet; Take 1 tablet (800 mg total) by mouth 4 (four) times daily.  Depression, unspecified depression type Ongoing symptoms.  Increase venlafaxine to below. -     venlafaxine (EFFEXOR) 75 MG tablet; Take 1 tablet (75 mg total) by mouth 2 (two) times daily.   Follow up plan: Return in about 3 months (around 03/22/2018). [name], MD [place]

## 2017-12-24 ENCOUNTER — Ambulatory Visit (HOSPITAL_BASED_OUTPATIENT_CLINIC_OR_DEPARTMENT_OTHER)
Admission: RE | Admit: 2017-12-24 | Discharge: 2017-12-24 | Disposition: A | Discharge status: Home / Self Care | Payer: Medicare HMO | Source: Ambulatory Visit | Attending: Cardiovascular Disease | Admitting: Cardiovascular Disease

## 2017-12-24 ENCOUNTER — Encounter (HOSPITAL_COMMUNITY): Payer: Self-pay

## 2017-12-24 ENCOUNTER — Encounter (HOSPITAL_COMMUNITY)
Admission: RE | Admit: 2017-12-24 | Discharge: 2017-12-24 | Disposition: A | Discharge status: Home / Self Care | Payer: Medicare HMO | Source: Ambulatory Visit | Attending: Cardiovascular Disease | Admitting: Cardiovascular Disease

## 2017-12-24 ENCOUNTER — Encounter (HOSPITAL_BASED_OUTPATIENT_CLINIC_OR_DEPARTMENT_OTHER)
Admission: RE | Admit: 2017-12-24 | Discharge: 2017-12-24 | Disposition: A | Discharge status: Home / Self Care | Payer: Medicare HMO | Source: Ambulatory Visit | Attending: Cardiovascular Disease | Admitting: Cardiovascular Disease

## 2017-12-24 DIAGNOSIS — Z6841 Body Mass Index (BMI) 40.0 and over, adult: Secondary | ICD-10-CM

## 2017-12-24 DIAGNOSIS — I10 Essential (primary) hypertension: Secondary | ICD-10-CM

## 2017-12-24 DIAGNOSIS — Z720 Tobacco use: Secondary | ICD-10-CM

## 2017-12-24 DIAGNOSIS — K219 Gastro-esophageal reflux disease without esophagitis: Secondary | ICD-10-CM | POA: Insufficient documentation

## 2017-12-24 DIAGNOSIS — E669 Obesity, unspecified: Secondary | ICD-10-CM

## 2017-12-24 DIAGNOSIS — J449 Chronic obstructive pulmonary disease, unspecified: Secondary | ICD-10-CM

## 2017-12-24 DIAGNOSIS — R079 Chest pain, unspecified: Secondary | ICD-10-CM

## 2017-12-24 DIAGNOSIS — R7303 Prediabetes: Secondary | ICD-10-CM

## 2017-12-24 LAB — NM MYOCAR MULTI W/SPECT W/WALL MOTION / EF
CHL CUP NUCLEAR SDS: 2
CHL CUP NUCLEAR SRS: 0
CHL CUP RESTING HR STRESS: 71 {beats}/min
CSEPEDS: 55 s
CSEPEW: 7 METS
CSEPPHR: 162 {beats}/min
Exercise duration (min): 4 min
LHR: 0.42
LV dias vol: 63 mL (ref 46–106)
LVSYSVOL: 23 mL
MPHR: 175 {beats}/min
NUC STRESS TID: 1.12
Percent HR: 92 %
RPE: 13
SSS: 2

## 2017-12-24 MED ORDER — REGADENOSON 0.4 MG/5ML IV SOLN
INTRAVENOUS | Status: AC
  Filled 2017-12-24: qty 5

## 2017-12-24 MED ORDER — TECHNETIUM TC 99M TETROFOSMIN IV KIT
10.0000 | PACK | Freq: Once | INTRAVENOUS | Status: AC | PRN
  Administered 2017-12-24: 11 via INTRAVENOUS

## 2017-12-24 MED ORDER — SODIUM CHLORIDE 0.9% FLUSH
INTRAVENOUS | Status: AC
  Administered 2017-12-24: 10 mL via INTRAVENOUS
  Filled 2017-12-24: qty 10

## 2017-12-24 MED ORDER — TECHNETIUM TC 99M TETROFOSMIN IV KIT
30.0000 | PACK | Freq: Once | INTRAVENOUS | Status: AC | PRN
  Administered 2017-12-24: 31.5 via INTRAVENOUS

## 2017-12-24 NOTE — Progress Notes (Signed)
*  PRELIMINARY RESULTS* Echocardiogram 2D Echocardiogram has been performed.  Judy Evans 12/24/2017, 12:51 PM

## 2017-12-25 ENCOUNTER — Telehealth: Payer: Self-pay

## 2017-12-25 ENCOUNTER — Other Ambulatory Visit: Payer: Self-pay | Admitting: *Deleted

## 2017-12-25 ENCOUNTER — Telehealth: Payer: Self-pay | Admitting: *Deleted

## 2017-12-25 DIAGNOSIS — M79662 Pain in left lower leg: Secondary | ICD-10-CM

## 2017-12-25 MED ORDER — GABAPENTIN 800 MG PO TABS: 800.0000 mg | ORAL_TABLET | Freq: Four times a day (QID) | ORAL | 1 refills | Status: DC

## 2017-12-25 NOTE — Telephone Encounter (Signed)
STRESS TEST -  Notes recorded by Herminio Commons, MD on 12/24/2017 at 5:02 PM EDT Low risk for blockages.   ECHO -  Notes recorded by Herminio Commons, MD on 12/24/2017 at 5:03 PM EDT Normal cardiac function.

## 2017-12-25 NOTE — Telephone Encounter (Signed)
[facility] - left message.  

## 2017-12-25 NOTE — Telephone Encounter (Signed)
Notes recorded by Laurine Blazer, LPN on 10/30/4268 at 62:37 PM EDT Patient notified. Copy to pmd. Follow up scheduled for 03/01/2018 with Dr. Bronson Ing.

## 2018-01-02 ENCOUNTER — Ambulatory Visit (INDEPENDENT_AMBULATORY_CARE_PROVIDER_SITE_OTHER): Payer: Medicare HMO

## 2018-01-02 ENCOUNTER — Telehealth: Payer: Self-pay

## 2018-01-02 VITALS — BP 133/86 | HR 85 | Temp 97.2°F | Ht 62.0 in | Wt 228.0 lb

## 2018-01-02 DIAGNOSIS — Z0000 Encounter for general adult medical examination without abnormal findings: Secondary | ICD-10-CM

## 2018-01-02 NOTE — Progress Notes (Addendum)
Subjective:   Judy Evans is a 47 y.o. female who presents for an Initial Medicare Annual Wellness Visit. She is very pleasant and states that she is recently separated from her husband and is living alone. Her favorite past time is spending time with her grandchildren. She has multiple medical problems and is currently on disability. She doesn't have a regular exercise routine but she has a membership to silver sneakers and is going to try that out. She states her mother is currently in ICU and she is having some stress from that. She has been in the hospital for 2 months. She has a car but its not dependable so that worries her at times.   Review of Systems      Cardiac Risk Factors include: diabetes mellitus;obesity (BMI >30kg/m2);sedentary lifestyle;smoking/ tobacco exposure     Objective:    Today's Vitals   01/02/18 1506  BP: 133/86  Pulse: 85  Temp: (!) 97.2 F (36.2 C)  TempSrc: Oral  Weight: 228 lb (103.4 kg)  Height: 5\' 2"  (1.575 m)   Body mass index is 41.7 kg/m.  Advanced Directives 01/02/2018 09/14/2016 09/11/2016 07/24/2016  Does Patient Have a Medical Advance Directive? No No No No  Would patient like information on creating a medical advance directive? No - Patient declined No - Patient declined No - Patient declined -   Patient states that she is going to have this taken care of asap due to the situation she has been dealing with with her mother and having to make decisions because her mother does not have any type of paperwork in place. Offered Scientist, clinical (histocompatibility and immunogenetics) but patient states that she is going directly to a Chief Executive Officer.   Current Medications (verified) Outpatient Encounter Medications as of 01/02/2018  Medication Sig  . acetaminophen (TYLENOL) 500 MG tablet Take 1,000 mg by mouth every 6 (six) hours as needed for mild pain.  Marland Kitchen albuterol (PROVENTIL HFA;VENTOLIN HFA) 108 (90 Base) MCG/ACT inhaler INHALE 2 PUFFS INTO THE LUNGS EVERY 4 (FOUR) HOURS AS  NEEDED FOR WHEEZING OR SHORTNESS OF BREATH.  Marland Kitchen AMITIZA 24 MCG capsule TAKE 1 CAPSULE (24 MCG TOTAL) BY MOUTH 2 (TWO) TIMES DAILY WITH A MEAL.  Marland Kitchen amitriptyline (ELAVIL) 25 MG tablet Take two at bedtime  . cetirizine (ZYRTEC) 10 MG tablet TAKE 1 TABLET (10 MG TOTAL) BY MOUTH DAILY.  Marland Kitchen diclofenac sodium (VOLTAREN) 1 % GEL Apply 4 g topically 4 (four) times daily. (Patient taking differently: Apply 4 g topically 4 (four) times daily as needed (pain). )  . fluticasone (FLONASE) 50 MCG/ACT nasal spray Place 2 sprays into both nostrils daily.  . fluticasone furoate-vilanterol (BREO ELLIPTA) 200-25 MCG/INH AEPB Inhale 1 puff into the lungs daily.  Marland Kitchen gabapentin (NEURONTIN) 800 MG tablet Take 1 tablet (800 mg total) by mouth 4 (four) times daily.  Marland Kitchen ibuprofen (ADVIL,MOTRIN) 600 MG tablet Take 1 tablet (600 mg total) by mouth every 8 (eight) hours as needed.  . lidocaine (LIDODERM) 5 % PLACE 1 PATCH ONTO THE SKIN DAILY. REMOVE & DISCARD PATCH WITHIN 12 HOURS OR AS DIRECTED BY MD  . mupirocin ointment (BACTROBAN) 2 % Use twice a day on affected areas  . nystatin ointment (MYCOSTATIN) APPLY 1 APPLICATION TOPICALLY 2 (TWO) TIMES DAILY AS NEEDED.  Marland Kitchen omeprazole (PRILOSEC) 40 MG capsule Take 1 capsule (40 mg total) by mouth daily.  . SUMAtriptan (IMITREX) 100 MG tablet Take 1 tablet (100 mg total) by mouth once as needed for migraine. May repeat in  2 hours if headache persists or recurs.  . triamcinolone lotion (KENALOG) 0.1 % Apply 1 application topically 2 (two) times daily.  Marland Kitchen venlafaxine (EFFEXOR) 75 MG tablet Take 1 tablet (75 mg total) by mouth 2 (two) times daily.  . Vitamin D, Ergocalciferol, (DRISDOL) 50000 units CAPS capsule Take 1 capsule (50,000 Units total) by mouth every 7 (seven) days.   No facility-administered encounter medications on file as of 01/02/2018.     Allergies (verified) Rosuvastatin calcium; Statins; Wellbutrin [bupropion]; Hctz [hydrochlorothiazide]; Hydrocodone-acetaminophen;  Oxycodone; and Penicillins   History: Past Medical History:  Diagnosis Date  . Anxiety   . Aphasia   . Arthritis   . COPD (chronic obstructive pulmonary disease) ([place])   . DDD (degenerative disc disease), cervical   . DDD (degenerative disc disease), lumbar   . Degenerative disc disease at L5-S1 level   . Diabetes mellitus without complication ([place])    States she does not have  . Fibromyalgia   . GERD (gastroesophageal reflux disease)   . H. pylori infection   . Hyperlipidemia    States she does not have  . Hypertension    States she does not have  . IBS (irritable bowel syndrome)   . LLQ pain 06/18/2015  . Migraines   . Moody 01/22/2015  . Pain with urination 02/19/2015  . Psoriasis   . Scoliosis   . Spondylolysis    Past Surgical History:  Procedure Laterality Date  . ABDOMINAL HYSTERECTOMY    . BIOPSY  09/14/2016   Procedure: BIOPSY;  Surgeon: Daneil Dolin, MD;  [facility];  Service: Endoscopy;;  gastric   . CARDIAC CATHETERIZATION  2015   patient reported it to be normal. "I was dying in pain during procedure".   . CARPAL TUNNEL RELEASE Right   . CESAREAN SECTION    . CHOLECYSTECTOMY    . COLONOSCOPY  06/2014   Altamese Dilling DeMason: normal  . DILATION AND CURETTAGE OF UTERUS    . ESOPHAGOGASTRODUODENOSCOPY  07/2014   Burke Keels: Normal  . ESOPHAGOGASTRODUODENOSCOPY (EGD) WITH PROPOFOL N/A 09/14/2016   Dr. Gala Romney: Normal esophagus status post empiric dilation, erosive gastropathy with biopsies showing chronic inactive gastritis, no H pylori.  Marland Kitchen FOOT SURGERY Bilateral    removal of heel spurs  . MALONEY DILATION  09/14/2016   Procedure: Venia Minks DILATION;  Surgeon: Daneil Dolin, MD;  [facility];  Service: Endoscopy;;  . rotator cuff surgery Right   . TONSILLECTOMY     Family History  Problem Relation Age of Onset  . COPD Mother   . Diabetes Mother   . Osteoarthritis Mother   . Atrial fibrillation Mother   . Heart disease Mother   .  Healthy Father   . Lupus Sister   . CAD Sister   . Diabetes Sister   . Early death Brother        pneumonia  . Asthma Daughter   . Asthma Son   . Cancer Maternal Grandmother 60       colon  . Arthritis Maternal Grandmother   . Diabetes Maternal Grandmother   . Dementia Maternal Grandmother   . Other Maternal Grandfather        brain tumor  . Cancer Paternal Grandmother        breast  . Cancer Paternal Grandfather        lung  . Other Paternal Grandfather        brain aneurysm  . Factor V Leiden deficiency Sister   .  Arthritis Sister   . Psoriasis Sister    Social History   Socioeconomic History  . Marital status: Legally Separated    Spouse name: Not on file  . Number of children: 2  . Years of education: Not on file  . Highest education level: GED or equivalent  Occupational History  . Occupation: Disabled  Social Needs  . Financial resource strain: Somewhat hard  . Food insecurity:    Worry: Sometimes true    Inability: Sometimes true  . Transportation needs:    Medical: No    Non-medical: No  Tobacco Use  . Smoking status: Current Every Day Smoker    Packs/day: 1.50    Years: 30.00    Pack years: 45.00    Types: Cigarettes  . Smokeless tobacco: Never Used  Substance and Sexual Activity  . Alcohol use: No  . Drug use: No  . Sexual activity: Not Currently    Birth control/protection: Surgical    Comment: hyst  Lifestyle  . Physical activity:    Days per week: 0 days    Minutes per session: 0 min  . Stress: Not at all  Relationships  . Social connections:    Talks on phone: More than three times a week    Gets together: More than three times a week    Attends religious service: Never    Active member of club or organization: No    Attends meetings of clubs or organizations: Never    Relationship status: Separated  Other Topics Concern  . Not on file  Social History Narrative  . Not on file    Tobacco Counseling Patient is currently a smoker  and does not show interest in quitting at this time   Clinical Intake:                        Activities of Daily Living In your present state of health, do you have any difficulty performing the following activities: 01/02/2018  Hearing? N  Vision? N  Difficulty concentrating or making decisions? N  Walking or climbing stairs? Y  Comment Sometimes due to arthritis of knees and chronic pain  Dressing or bathing? N  Doing errands, shopping? N  Preparing Food and eating ? N  Using the Toilet? N  In the past six months, have you accidently leaked urine? N  Do you have problems with loss of bowel control? N  Managing your Medications? N  Managing your Finances? N  Housekeeping or managing your Housekeeping? N  Some recent data might be hidden     Immunizations and Health Maintenance Immunization History  Administered Date(s) Administered  . Influenza,inj,Quad PF,6+ Mos 08/24/2017  . Influenza-Unspecified 06/10/2013, 11/01/2015  . Pneumococcal Polysaccharide-23 08/24/2017  . Td 06/10/2004  . Tdap 06/14/2017   Health Maintenance Due  Topic Date Due  . OPHTHALMOLOGY EXAM  01/22/1982  . HIV Screening  01/23/1987  . FOOT EXAM  11/21/2016  . HEMOGLOBIN A1C  07/24/2017   Patient will review her health maintenance at follow up appt  Patient Care Team: Eustaquio Maize, MD as PCP - General (Pediatrics) Gala Romney, Cristopher Estimable, MD as Consulting Physician (Gastroenterology) Sandford Craze, MD as Referring Physician (Dermatology)  Indicate any recent Medical Services you may have received from other than [facility] providers in the past year (date may be approximate).     Assessment:   This is a routine wellness examination for Judy Evans.  Hearing/Vision screen No exam data present  Dietary issues and exercise activities discussed: Current Exercise Habits: The patient does not participate in regular exercise at present, Exercise limited by: orthopedic  condition(s);respiratory conditions(s);Other - see comments(Fibromyalgia)  Goals    . DIET - EAT MORE FRUITS AND VEGETABLES    . Exercise 150 min/wk Moderate Activity      Depression Screen PHQ 2/9 Scores 01/02/2018 12/20/2017 11/22/2017 08/24/2017 08/17/2017 06/28/2017 06/14/2017  PHQ - 2 Score 0 0 4 0 0 0 0  PHQ- 9 Score 6 - 10 - - - -    Fall Risk Fall Risk  01/02/2018 12/20/2017 08/24/2017 08/17/2017 06/14/2017  Falls in the past year? No No No No No  Number falls in past yr: - - - - -  Injury with Fall? - - - - -    Is the patient's home free of loose throw rugs in walkways, pet beds, electrical cords, etc?   yes      Grab bars in the bathroom? no      Handrails on the stairs?   yes      Adequate lighting?   yes    Cognitive Function: MMSE - Mini Mental State Exam 01/02/2018  Orientation to time 5  Orientation to Place 5  Registration 3  Attention/ Calculation 5  Recall 3  Language- name 2 objects 2  Language- repeat 1  Language- follow 3 step command 3  Language- read & follow direction 1  Write a sentence 1  Copy design 1  Total score 30        Screening Tests Health Maintenance  Topic Date Due  . OPHTHALMOLOGY EXAM  01/22/1982  . HIV Screening  01/23/1987  . FOOT EXAM  11/21/2016  . HEMOGLOBIN A1C  07/24/2017  . URINE MICROALBUMIN  01/22/2018  . INFLUENZA VACCINE  04/25/2018  . PNEUMOCOCCAL POLYSACCHARIDE VACCINE (2) 08/24/2022  . TETANUS/TDAP  06/15/2027    Qualifies for Shingles Vaccine? Will discuss with PCP at future visit  Cancer Screenings: Lung: Low Dose CT Chest recommended if Age 14-80 years, 30 pack-year currently smoking OR have quit w/in 15years. Patient does qualify. Breast: Up to date on Mammogram? Yes   Up to date of Bone Density/Dexa? No  Not sure if patient qualifies. Will discuss with PCP Colorectal:  Patient has had a colonoscopy and states she is not due for recheck  Additional Screenings: Hepatitis C Screening:  Chart does not show  a hepatitis screening. Will add to bloodwork order when she come for follow up appt     Plan:     I have personally reviewed and noted the following in the patient's chart:   . Medical and social history . Use of alcohol, tobacco or illicit drugs  . Current medications and supplements . Functional ability and status . Nutritional status . Physical activity . Advanced directives . List of other physicians . Hospitalizations, surgeries, and ER visits in previous 12 months . Vitals . Screenings to include cognitive, depression, and falls . Referrals and appointments  In addition, I have reviewed and discussed with patient certain preventive protocols, quality metrics, and best practice recommendations. A written personalized care plan for preventive services as well as general preventive health recommendations were provided to patient.     Rolena Infante, LPN   3/79/0240   I have reviewed and agree with the above AWV documentation.   [name], MD [facility] 01/03/2018, 12:47 PM

## 2018-01-02 NOTE — Telephone Encounter (Signed)
Patient had an AWV today. Please review. She qualifies for a chest  CT due to smoking. Is this ok to order?

## 2018-01-02 NOTE — Patient Instructions (Signed)
  Ms. Karpel , Thank you for taking time to come for your Medicare Wellness Visit. I appreciate your ongoing commitment to your health goals. Please review the following plan we discussed and let me know if I can assist you in the future.   These are the goals we discussed: Goals    . DIET - EAT MORE FRUITS AND VEGETABLES    . Exercise 150 min/wk Moderate Activity       This is a list of the screening recommended for you and due dates:  Health Maintenance  Topic Date Due  . Eye exam for diabetics  01/22/1982  . HIV Screening  01/23/1987  . Complete foot exam   11/21/2016  . Hemoglobin A1C  07/24/2017  . Urine Protein Check  01/22/2018  . Flu Shot  04/25/2018  . Pneumococcal vaccine (2) 08/24/2022  . Tetanus Vaccine  06/15/2027

## 2018-01-03 NOTE — Telephone Encounter (Signed)
She's 47yo, so not between the 39-15year old window so she does not qualify yet.

## 2018-01-18 ENCOUNTER — Other Ambulatory Visit: Payer: Self-pay

## 2018-01-18 DIAGNOSIS — F32A Depression, unspecified: Secondary | ICD-10-CM

## 2018-01-18 DIAGNOSIS — F329 Major depressive disorder, single episode, unspecified: Secondary | ICD-10-CM

## 2018-01-18 MED ORDER — VENLAFAXINE HCL 75 MG PO TABS: 75.0000 mg | ORAL_TABLET | Freq: Two times a day (BID) | ORAL | 0 refills | Status: DC

## 2018-01-21 ENCOUNTER — Encounter (INDEPENDENT_AMBULATORY_CARE_PROVIDER_SITE_OTHER): Payer: Medicare HMO

## 2018-01-21 DIAGNOSIS — R002 Palpitations: Secondary | ICD-10-CM | POA: Diagnosis not present

## 2018-02-04 ENCOUNTER — Telehealth: Payer: Self-pay | Admitting: Clinical

## 2018-02-04 NOTE — Telephone Encounter (Signed)
This [facility] specialist left message to call back with name and contact information. 

## 2018-02-15 ENCOUNTER — Other Ambulatory Visit: Payer: Self-pay | Admitting: Neurology

## 2018-02-28 ENCOUNTER — Telehealth: Payer: Self-pay | Admitting: *Deleted

## 2018-02-28 NOTE — Telephone Encounter (Signed)
Notes recorded by Laurine Blazer, LPN on 2/0/0379 at 4:44 AM EDT Patient notified. Copy to pmd. Follow up scheduled for 03/01/2018 with Dr. Bronson Ing.   ------  Notes recorded by Laurine Blazer, LPN on 03/13/121 at 2:41 PM EDT Left message to return call.  ------  Notes recorded by Herminio Commons, MD on 02/22/2018 at 1:18 PM EDT No significant arrhythmias.

## 2018-03-01 ENCOUNTER — Encounter: Payer: Self-pay | Admitting: Cardiovascular Disease

## 2018-03-01 ENCOUNTER — Ambulatory Visit (INDEPENDENT_AMBULATORY_CARE_PROVIDER_SITE_OTHER): Payer: Medicare HMO | Admitting: Cardiovascular Disease

## 2018-03-01 VITALS — BP 94/62 | HR 92 | Ht 62.0 in | Wt 224.0 lb

## 2018-03-01 DIAGNOSIS — R079 Chest pain, unspecified: Secondary | ICD-10-CM | POA: Diagnosis not present

## 2018-03-01 DIAGNOSIS — N644 Mastodynia: Secondary | ICD-10-CM | POA: Diagnosis not present

## 2018-03-01 DIAGNOSIS — R002 Palpitations: Secondary | ICD-10-CM | POA: Diagnosis not present

## 2018-03-01 NOTE — Patient Instructions (Signed)
Medication Instructions:   Your physician recommends that you continue on your current medications as directed. Please refer to the Current Medication list given to you today.  Labwork:  NONE  Testing/Procedures:  NONE  Follow-Up:  Your physician recommends that you schedule a follow-up appointment in: as needed.   Any Other Special Instructions Will Be Listed Below (If Applicable).  If you need a refill on your cardiac medications before your next appointment, please call your pharmacy. 

## 2018-03-01 NOTE — Progress Notes (Signed)
SUBJECTIVE: The patient returns for follow-up after undergoing cardiovascular testing performed for the evaluation of chest pain.  Nuclear stress test on 12/24/2017 was low risk.  Soft tissue attenuation artifact was noted.  There is no obvious evidence of ischemia.  Echocardiogram demonstrated normal left ventricular systolic and diastolic function, LVEF 60 to 65%.  Event monitoring demonstrated sinus rhythm with no significant arrhythmias.  After a 115-day hospitalization and recovery, her mother has finally returned home.  She is in charge of her mother's medical care.  She continues to experience stress but it is decreasing.  She denies exertional chest pain and exertional dyspnea.  She has intermittent bilateral breast pain.  She is spoken about this with her PCP and will eventually be scheduled for a mammogram.   Review of Systems: As per "subjective", otherwise negative.  Allergies  Allergen Reactions  . Rosuvastatin Calcium Swelling    Ends up in the hospital  . Statins Swelling    Ends up in the hospital  . Wellbutrin [Bupropion]     suicidal  . Hctz [Hydrochlorothiazide] Nausea And Vomiting and Rash  . Hydrocodone-Acetaminophen Rash  . Oxycodone Rash  . Penicillins Nausea And Vomiting and Rash    Has patient had a PCN reaction causing immediate rash, facial/tongue/throat swelling, SOB or lightheadedness with hypotension: Yes Has patient had a PCN reaction causing severe rash involving mucus membranes or skin necrosis: No Has patient had a PCN reaction that required hospitalization No Has patient had a PCN reaction occurring within the last 10 years: No If all of the above answers are "NO", then may proceed with Cephalosporin use.     Current Outpatient Medications  Medication Sig Dispense Refill  . acetaminophen (TYLENOL) 500 MG tablet Take 1,000 mg by mouth every 6 (six) hours as needed for mild pain.    Marland Kitchen albuterol (PROVENTIL HFA;VENTOLIN HFA) 108 (90 Base)  MCG/ACT inhaler INHALE 2 PUFFS INTO THE LUNGS EVERY 4 (FOUR) HOURS AS NEEDED FOR WHEEZING OR SHORTNESS OF BREATH. 18 Inhaler 4  . AMITIZA 24 MCG capsule TAKE 1 CAPSULE (24 MCG TOTAL) BY MOUTH 2 (TWO) TIMES DAILY WITH A MEAL. 60 capsule 5  . amitriptyline (ELAVIL) 25 MG tablet Take two at bedtime 180 tablet 0  . cetirizine (ZYRTEC) 10 MG tablet TAKE 1 TABLET (10 MG TOTAL) BY MOUTH DAILY. 30 tablet 8  . diclofenac sodium (VOLTAREN) 1 % GEL Apply 4 g topically 4 (four) times daily. (Patient taking differently: Apply 4 g topically 4 (four) times daily as needed (pain). ) 100 g 2  . fluticasone (FLONASE) 50 MCG/ACT nasal spray Place 2 sprays into both nostrils daily. 16 g 6  . fluticasone furoate-vilanterol (BREO ELLIPTA) 200-25 MCG/INH AEPB Inhale 1 puff into the lungs daily. 1 each 3  . gabapentin (NEURONTIN) 800 MG tablet Take 1 tablet (800 mg total) by mouth 4 (four) times daily. 360 tablet 1  . ibuprofen (ADVIL,MOTRIN) 600 MG tablet Take 1 tablet (600 mg total) by mouth every 8 (eight) hours as needed. 15 tablet 0  . lidocaine (LIDODERM) 5 % PLACE 1 PATCH ONTO THE SKIN DAILY. REMOVE & DISCARD PATCH WITHIN 12 HOURS OR AS DIRECTED BY MD 30 patch 2  . mupirocin ointment (BACTROBAN) 2 % Use twice a day on affected areas 30 g 0  . nystatin ointment (MYCOSTATIN) APPLY 1 APPLICATION TOPICALLY 2 (TWO) TIMES DAILY AS NEEDED. 30 g 0  . omeprazole (PRILOSEC) 40 MG capsule Take 1 capsule (40 mg total)  by mouth daily. 90 capsule 1  . SUMAtriptan (IMITREX) 100 MG tablet Take 1 tablet (100 mg total) by mouth once as needed for migraine. May repeat in 2 hours if headache persists or recurs. 10 tablet 5  . triamcinolone lotion (KENALOG) 0.1 % Apply 1 application topically 2 (two) times daily.    Marland Kitchen venlafaxine (EFFEXOR) 75 MG tablet Take 1 tablet (75 mg total) by mouth 2 (two) times daily. 180 tablet 0  . Vitamin D, Ergocalciferol, (DRISDOL) 50000 units CAPS capsule Take 1 capsule (50,000 Units total) by mouth every 7  (seven) days. 12 capsule 0   No current facility-administered medications for this visit.     Past Medical History:  Diagnosis Date  . Anxiety   . Aphasia   . Arthritis   . COPD (chronic obstructive pulmonary disease) ([place])   . DDD (degenerative disc disease), cervical   . DDD (degenerative disc disease), lumbar   . Degenerative disc disease at L5-S1 level   . Diabetes mellitus without complication ([place])    States she does not have  . Fibromyalgia   . GERD (gastroesophageal reflux disease)   . H. pylori infection   . Hyperlipidemia    States she does not have  . Hypertension    States she does not have  . IBS (irritable bowel syndrome)   . LLQ pain 06/18/2015  . Migraines   . Moody 01/22/2015  . Pain with urination 02/19/2015  . Psoriasis   . Scoliosis   . Spondylolysis     Past Surgical History:  Procedure Laterality Date  . ABDOMINAL HYSTERECTOMY    . BIOPSY  09/14/2016   Procedure: BIOPSY;  Surgeon: Daneil Dolin, MD;  [facility];  Service: Endoscopy;;  gastric   . CARDIAC CATHETERIZATION  2015   patient reported it to be normal. "I was dying in pain during procedure".   . CARPAL TUNNEL RELEASE Right   . CESAREAN SECTION    . CHOLECYSTECTOMY    . COLONOSCOPY  06/2014   Altamese Dilling DeMason: normal  . DILATION AND CURETTAGE OF UTERUS    . ESOPHAGOGASTRODUODENOSCOPY  07/2014   Burke Keels: Normal  . ESOPHAGOGASTRODUODENOSCOPY (EGD) WITH PROPOFOL N/A 09/14/2016   Dr. Gala Romney: Normal esophagus status post empiric dilation, erosive gastropathy with biopsies showing chronic inactive gastritis, no H pylori.  Marland Kitchen FOOT SURGERY Bilateral    removal of heel spurs  . MALONEY DILATION  09/14/2016   Procedure: Venia Minks DILATION;  Surgeon: Daneil Dolin, MD;  [facility];  Service: Endoscopy;;  . rotator cuff surgery Right   . TONSILLECTOMY      Social History   Socioeconomic History  . Marital status: Legally Separated    Spouse name: Not on file  .  Number of children: 2  . Years of education: Not on file  . Highest education level: GED or equivalent  Occupational History  . Occupation: Disabled  Social Needs  . Financial resource strain: Somewhat hard  . Food insecurity:    Worry: Sometimes true    Inability: Sometimes true  . Transportation needs:    Medical: No    Non-medical: No  Tobacco Use  . Smoking status: Current Every Day Smoker    Packs/day: 1.50    Years: 30.00    Pack years: 45.00    Types: Cigarettes  . Smokeless tobacco: Never Used  Substance and Sexual Activity  . Alcohol use: No  . Drug use: No  . Sexual activity:  Not Currently    Birth control/protection: Surgical    Comment: hyst  Lifestyle  . Physical activity:    Days per week: 0 days    Minutes per session: 0 min  . Stress: Not at all  Relationships  . Social connections:    Talks on phone: More than three times a week    Gets together: More than three times a week    Attends religious service: Never    Active member of club or organization: No    Attends meetings of clubs or organizations: Never    Relationship status: Separated  . Intimate partner violence:    Fear of current or ex partner: No    Emotionally abused: No    Physically abused: No    Forced sexual activity: No  Other Topics Concern  . Not on file  Social History Narrative  . Not on file     Vitals:   03/01/18 1416  BP: 94/62  Pulse: 92  SpO2: 97%  Weight: 224 lb (101.6 kg)  Height: 5\' 2"  (1.575 m)    Wt Readings from Last 3 Encounters:  03/01/18 224 lb (101.6 kg)  01/02/18 228 lb (103.4 kg)  12/20/17 226 lb 6.4 oz (102.7 kg)     PHYSICAL EXAM General: NAD HEENT: Normal. Neck: No JVD, no thyromegaly. Lungs: Clear to auscultation bilaterally with normal respiratory effort. CV: Regular rate and rhythm, normal S1/S2, no S3/S4, no murmur. No pretibial or periankle edema.  Abdomen: Soft, nontender, no distention.  Neurologic: Alert and oriented.  Psych:  Normal affect. Skin: Normal. Musculoskeletal: No gross deformities.    ECG: Most recent ECG reviewed.   Labs: Lab Results  Component Value Date/Time   K 4.2 01/22/2017 10:57 AM   BUN 6 01/22/2017 10:57 AM   CREATININE 0.67 01/22/2017 10:57 AM   CREATININE 0.76 07/07/2016 03:25 PM   ALT 19 01/22/2017 10:57 AM   TSH 0.94 07/07/2016 03:25 PM   HGB 15.8 (H) 07/07/2016 03:25 PM   HGB 15.4 11/01/2015 01:12 PM     Lipids: Lab Results  Component Value Date/Time   LDLCALC 116 (H) 01/22/2017 10:57 AM   CHOL 199 01/22/2017 10:57 AM   TRIG 187 (H) 01/22/2017 10:57 AM   HDL 46 01/22/2017 10:57 AM       ASSESSMENT AND PLAN: 1.  Chest pain: Cardiac testing reviewed above which was found to be normal.  No further testing is indicated.  2.  Palpitations: No arrhythmias seen with cardiac monitoring as noted above.  No further testing is indicated.  3.  Bilateral breast pain: She also has a family history of breast cancer and a mammogram is indicated given bilateral breast pain as well.  This will be scheduled by her PCP.     Disposition: Follow up as needed   Kate Sable, M.D., F.A.C.C.

## 2018-03-04 DIAGNOSIS — D4989 Neoplasm of unspecified behavior of other specified sites: Secondary | ICD-10-CM | POA: Diagnosis not present

## 2018-03-04 DIAGNOSIS — L400 Psoriasis vulgaris: Secondary | ICD-10-CM | POA: Diagnosis not present

## 2018-03-04 DIAGNOSIS — D485 Neoplasm of uncertain behavior of skin: Secondary | ICD-10-CM | POA: Diagnosis not present

## 2018-03-06 ENCOUNTER — Other Ambulatory Visit: Payer: Self-pay | Admitting: Pediatrics

## 2018-03-06 ENCOUNTER — Other Ambulatory Visit: Payer: Self-pay | Admitting: Neurology

## 2018-03-06 DIAGNOSIS — Z1239 Encounter for other screening for malignant neoplasm of breast: Secondary | ICD-10-CM

## 2018-03-11 ENCOUNTER — Other Ambulatory Visit: Payer: Self-pay | Admitting: Pediatrics

## 2018-03-11 DIAGNOSIS — J441 Chronic obstructive pulmonary disease with (acute) exacerbation: Secondary | ICD-10-CM

## 2018-03-12 ENCOUNTER — Telehealth: Payer: Self-pay

## 2018-03-12 NOTE — Telephone Encounter (Signed)
Writer is unable to make contact with the patient.  Writer has made several attempts to follow up with the patient. Writer will make this patient inactive.  Writer routed this information to PCP and Dr. Hisada  

## 2018-03-14 NOTE — Telephone Encounter (Signed)
Error in charting.

## 2018-03-18 ENCOUNTER — Encounter

## 2018-03-18 ENCOUNTER — Encounter: Payer: Self-pay | Admitting: Neurology

## 2018-03-18 ENCOUNTER — Ambulatory Visit (INDEPENDENT_AMBULATORY_CARE_PROVIDER_SITE_OTHER): Payer: Medicare HMO | Admitting: Neurology

## 2018-03-18 ENCOUNTER — Other Ambulatory Visit: Payer: Self-pay

## 2018-03-18 VITALS — BP 133/85 | HR 89 | Resp 18 | Ht 62.0 in | Wt 224.5 lb

## 2018-03-18 DIAGNOSIS — G43709 Chronic migraine without aura, not intractable, without status migrainosus: Secondary | ICD-10-CM

## 2018-03-18 DIAGNOSIS — M7062 Trochanteric bursitis, left hip: Secondary | ICD-10-CM | POA: Diagnosis not present

## 2018-03-18 DIAGNOSIS — M25512 Pain in left shoulder: Secondary | ICD-10-CM | POA: Diagnosis not present

## 2018-03-18 DIAGNOSIS — M542 Cervicalgia: Secondary | ICD-10-CM | POA: Diagnosis not present

## 2018-03-18 DIAGNOSIS — G5622 Lesion of ulnar nerve, left upper limb: Secondary | ICD-10-CM | POA: Diagnosis not present

## 2018-03-18 DIAGNOSIS — IMO0002 Reserved for concepts with insufficient information to code with codable children: Secondary | ICD-10-CM

## 2018-03-18 DIAGNOSIS — G5603 Carpal tunnel syndrome, bilateral upper limbs: Secondary | ICD-10-CM | POA: Diagnosis not present

## 2018-03-18 MED ORDER — AMITRIPTYLINE HCL 25 MG PO TABS: ORAL_TABLET | ORAL | 11 refills | Status: DC

## 2018-03-18 NOTE — Progress Notes (Signed)
[facility]  PATIENT: Judy Evans DOB: 04-03-1971  REFERRING DOCTOR OR PCP:  Dr. Evette Doffing SOURCE: Patient, notes from Dr. Evette Doffing imaging reports, MRI images on PACS.  _________________________________   HISTORICAL  CHIEF COMPLAINT:  Chief Complaint  Patient presents with  . Migraines    Sts. h/a's have been less frequent, less severe since increasing Amitriptyline to 50mg  qhs. Sts. she continues ot have pain in neck, left shoulder, left leg, knees.  Sts. left shoulder and left hip injections given at last ov helped/fim  . Neck Pain  . Fibromyalgia    HISTORY OF PRESENT ILLNESS:  Judy Evans is a 47 yo woman with headaches, shoulder pain and neck pain.  Update 03/18/2018: She feels her migraines are helping a lot since the amitriptyline was titrated up.     She does not get the severe ones anymore and most of the headaches are short and mild.    At a prior visit, we did a shoulder injection and the pain was much better until a few months ago.  Pain is worse with driving and with elevating the arm.    She will note some numbness in her hand at times.     She also has left hip and buttock pain.   A trochanteric bursa injection helped her a lot in the past.      She has numbness in her arms, left > right.  This fluctuates some.   MRI shows C3C4 > C5C6 DJD with foraminal narrowing to the left.       From 03/13/2017:  Her more constant headache and migraines are better on amitriptyline but she continues to get the sharp pain at times that is felt mostly in the right temple.    She tolerates the amitriptyline well.   HA History:   She has had headaches off and on x many years but they worsened 4-5 years ago. She had three especially bad headaches associated with difficulty speaking.   She saw a neurologist at [facility].   She was placed on amitriptyline with some benefit.   However, when she moved, her new PCP would not right amitriptyline for her.   She was  referred to a pain management practice.    When they occur, she often gets sharp pains on the right.   Pain is stabbing.   She gets nausea but rarely has vomiting.   She has had no furhter migraine with aphasia.  Moving increases her pain.   Bright lights and noises increases her pain.  Amitriptyline has helped the headaches Triptans help sometimes but not always.   Tylenol arthritis helps a little bit.   .  FMS/neck pain/LBP:  She still notes the fibromyalgia pain, mostly in the back, neck and hips.     She also has left hip bursitis and at times it seems like the leg is going to give out.     She is on Cymbalta and gabapentin (800 mg po qid) with some benefit.   She is also on Dilaudid 2 mg qid with benfit.  She has had lumbar spinal injections with benefit.    She sees Dr. Francesco Runner for [facility]) for pain management.        Shoulder pain:   The left shoulder pain did better withteh subacromial bursa injection but benefit was only about 2 weeks. Pain increases with elevation and external rotation.    I personally reviewed an MRI of the cervical spine dated 07/14/2016.  At C3-C4, she has a disc osteophyte complex causing moderate left foraminal narrowing with some encroachment upon the left C4 nerve root. At C5-C6 she has mild left foraminal narrowing due to a small disc osteophyte complex. There does not appear to be any nerve root compression.  REVIEW OF SYSTEMS: Constitutional: No fevers, chills, sweats, or change in appetite Eyes: No visual changes, double vision, eye pain Ear, nose and throat: No hearing loss, ear pain, nasal congestion, sore throat Cardiovascular: No chest pain, palpitations Respiratory: No shortness of breath at rest or with exertion.   No wheezes GastrointestinaI: has Irritable bowel Genitourinary: No dysuria, urinary retention or frequency.  No nocturia. Musculoskeletal: as above, fibromyalgia/neck pain/ shoulder pain/back pain Integumentary: Has  psoriasis Neurological: as above Psychiatric: No depression at this time.  No anxiety Endocrine: has pre-diabetesNo palpitations, diaphoresis, change in appetite, change in weigh or increased thirst Hematologic/Lymphatic: No anemia, purpura, petechiae. Allergic/Immunologic: No itchy/runny eyes, nasal congestion, recent allergic reactions, rashes  ALLERGIES: Allergies  Allergen Reactions  . Rosuvastatin Calcium Swelling    Ends up in the hospital  . Statins Swelling    Ends up in the hospital  . Wellbutrin [Bupropion]     suicidal  . Hctz [Hydrochlorothiazide] Nausea And Vomiting and Rash  . Hydrocodone-Acetaminophen Rash  . Oxycodone Rash  . Penicillins Nausea And Vomiting and Rash    Has patient had a PCN reaction causing immediate rash, facial/tongue/throat swelling, SOB or lightheadedness with hypotension: Yes Has patient had a PCN reaction causing severe rash involving mucus membranes or skin necrosis: No Has patient had a PCN reaction that required hospitalization No Has patient had a PCN reaction occurring within the last 10 years: No If all of the above answers are "NO", then may proceed with Cephalosporin use.     HOME MEDICATIONS:  Current Outpatient Medications:  .  acetaminophen (TYLENOL) 500 MG tablet, Take 1,000 mg by mouth every 6 (six) hours as needed for mild pain., Disp: , Rfl:  .  albuterol (PROVENTIL HFA;VENTOLIN HFA) 108 (90 Base) MCG/ACT inhaler, INHALE 2 PUFFS INTO THE LUNGS EVERY 4 (FOUR) HOURS AS NEEDED FOR WHEEZING OR SHORTNESS OF BREATH., Disp: 18 Inhaler, Rfl: 4 .  AMITIZA 24 MCG capsule, TAKE 1 CAPSULE (24 MCG TOTAL) BY MOUTH 2 (TWO) TIMES DAILY WITH A MEAL., Disp: 60 capsule, Rfl: 5 .  amitriptyline (ELAVIL) 25 MG tablet, One or two po qHS, Disp: 60 tablet, Rfl: 11 .  BREO ELLIPTA 200-25 MCG/INH AEPB, TAKE 1 PUFF BY MOUTH EVERY DAY, Disp: 180 each, Rfl: 0 .  cetirizine (ZYRTEC) 10 MG tablet, TAKE 1 TABLET (10 MG TOTAL) BY MOUTH DAILY., Disp: 30  tablet, Rfl: 8 .  diclofenac sodium (VOLTAREN) 1 % GEL, Apply 4 g topically 4 (four) times daily. (Patient taking differently: Apply 4 g topically 4 (four) times daily as needed (pain). ), Disp: 100 g, Rfl: 2 .  fluticasone (FLONASE) 50 MCG/ACT nasal spray, Place 2 sprays into both nostrils daily., Disp: 16 g, Rfl: 6 .  gabapentin (NEURONTIN) 800 MG tablet, Take 1 tablet (800 mg total) by mouth 4 (four) times daily., Disp: 360 tablet, Rfl: 1 .  ibuprofen (ADVIL,MOTRIN) 600 MG tablet, Take 1 tablet (600 mg total) by mouth every 8 (eight) hours as needed., Disp: 15 tablet, Rfl: 0 .  lidocaine (LIDODERM) 5 %, PLACE 1 PATCH ONTO THE SKIN DAILY. REMOVE & DISCARD PATCH WITHIN 12 HOURS OR AS DIRECTED BY MD, Disp: 30 patch, Rfl: 2 .  mupirocin ointment (BACTROBAN)  2 %, Use twice a day on affected areas, Disp: 30 g, Rfl: 0 .  nystatin ointment (MYCOSTATIN), APPLY 1 APPLICATION TOPICALLY 2 (TWO) TIMES DAILY AS NEEDED., Disp: 30 g, Rfl: 0 .  omeprazole (PRILOSEC) 40 MG capsule, Take 1 capsule (40 mg total) by mouth daily., Disp: 90 capsule, Rfl: 1 .  SUMAtriptan (IMITREX) 100 MG tablet, Take 1 tablet (100 mg total) by mouth once as needed for migraine. May repeat in 2 hours if headache persists or recurs., Disp: 10 tablet, Rfl: 5 .  triamcinolone lotion (KENALOG) 0.1 %, Apply 1 application topically 2 (two) times daily., Disp: , Rfl:  .  venlafaxine (EFFEXOR) 75 MG tablet, Take 1 tablet (75 mg total) by mouth 2 (two) times daily., Disp: 180 tablet, Rfl: 0 .  Vitamin D, Ergocalciferol, (DRISDOL) 50000 units CAPS capsule, Take 1 capsule (50,000 Units total) by mouth every 7 (seven) days., Disp: 12 capsule, Rfl: 0  PAST MEDICAL HISTORY: Past Medical History:  Diagnosis Date  . Anxiety   . Aphasia   . Arthritis   . COPD (chronic obstructive pulmonary disease) ([place])   . DDD (degenerative disc disease), cervical   . DDD (degenerative disc disease), lumbar   . Degenerative disc disease at L5-S1 level   .  Diabetes mellitus without complication ([place])    States she does not have  . Fibromyalgia   . GERD (gastroesophageal reflux disease)   . H. pylori infection   . Hyperlipidemia    States she does not have  . Hypertension    States she does not have  . IBS (irritable bowel syndrome)   . LLQ pain 06/18/2015  . Migraines   . Moody 01/22/2015  . Pain with urination 02/19/2015  . Psoriasis   . Scoliosis   . Spondylolysis     PAST SURGICAL HISTORY: Past Surgical History:  Procedure Laterality Date  . ABDOMINAL HYSTERECTOMY    . BIOPSY  09/14/2016   Procedure: BIOPSY;  Surgeon: Daneil Dolin, MD;  Location: [facility];  Service: Endoscopy;;  gastric   . CARDIAC CATHETERIZATION  2015   patient reported it to be normal. "I was dying in pain during procedure".   . CARPAL TUNNEL RELEASE Right   . CESAREAN SECTION    . CHOLECYSTECTOMY    . COLONOSCOPY  06/2014   Altamese Dilling DeMason: normal  . DILATION AND CURETTAGE OF UTERUS    . ESOPHAGOGASTRODUODENOSCOPY  07/2014   Burke Keels: Normal  . ESOPHAGOGASTRODUODENOSCOPY (EGD) WITH PROPOFOL N/A 09/14/2016   Dr. Gala Romney: Normal esophagus status post empiric dilation, erosive gastropathy with biopsies showing chronic inactive gastritis, no H pylori.  Marland Kitchen FOOT SURGERY Bilateral    removal of heel spurs  . MALONEY DILATION  09/14/2016   Procedure: Venia Minks DILATION;  Surgeon: Daneil Dolin, MD;  Location: [facility];  Service: Endoscopy;;  . rotator cuff surgery Right   . TONSILLECTOMY      FAMILY HISTORY: Family History  Problem Relation Age of Onset  . COPD Mother   . Diabetes Mother   . Osteoarthritis Mother   . Atrial fibrillation Mother   . Heart disease Mother   . Healthy Father   . Lupus Sister   . CAD Sister   . Diabetes Sister   . Early death Brother        pneumonia  . Asthma Daughter   . Asthma Son   . Cancer Maternal Grandmother 23       colon  . Arthritis  Maternal Grandmother   . Diabetes Maternal Grandmother   .  Dementia Maternal Grandmother   . Other Maternal Grandfather        brain tumor  . Cancer Paternal Grandmother        breast  . Cancer Paternal Grandfather        lung  . Other Paternal Grandfather        brain aneurysm  . Factor V Leiden deficiency Sister   . Arthritis Sister   . Psoriasis Sister     SOCIAL HISTORY:  Social History   Socioeconomic History  . Marital status: Legally Separated    Spouse name: Not on file  . Number of children: 2  . Years of education: Not on file  . Highest education level: GED or equivalent  Occupational History  . Occupation: Disabled  Social Needs  . Financial resource strain: Somewhat hard  . Food insecurity:    Worry: Sometimes true    Inability: Sometimes true  . Transportation needs:    Medical: No    Non-medical: No  Tobacco Use  . Smoking status: Current Every Day Smoker    Packs/day: 1.50    Years: 30.00    Pack years: 45.00    Types: Cigarettes  . Smokeless tobacco: Never Used  Substance and Sexual Activity  . Alcohol use: No  . Drug use: No  . Sexual activity: Not Currently    Birth control/protection: Surgical    Comment: hyst  Lifestyle  . Physical activity:    Days per week: 0 days    Minutes per session: 0 min  . Stress: Not at all  Relationships  . Social connections:    Talks on phone: More than three times a week    Gets together: More than three times a week    Attends religious service: Never    Active member of club or organization: No    Attends meetings of clubs or organizations: Never    Relationship status: Separated  . Intimate partner violence:    Fear of current or ex partner: No    Emotionally abused: No    Physically abused: No    Forced sexual activity: No  Other Topics Concern  . Not on file  Social History Narrative  . Not on file     PHYSICAL EXAM  Vitals:   03/18/18 1001  BP: 133/85  Pulse: 89  Resp: 18  Weight: 224 lb 8 oz (101.8 kg)  Height: 5\' 2"  (1.575 m)    Body  mass index is 41.06 kg/m.   General: The patient is well-developed and well-nourished and in no acute distress   Neck: The neck is supple, no carotid bruits are noted.  The neck is mildly tender with good ROM   Musculoskeletal:  There is mild tenderness at the  splenius capitis muscles/occipital nerves.   She has mild (better) tenderness over the left subacromial bursa .   ROM is better.   She has tenderness over many of the classic fibromyalgia tender points and the left trochanteric bursa.  .   Neurologic Exam  Mental status: The patient is alert and oriented x 3 at the time of the examination. The patient has apparent normal recent and remote memory, with an apparently normal attention span and concentration ability.   Speech is normal.  Cranial nerves: Extraocular muscles are intact. Pupils reactive to light and accommodation. Facial strength and sensation is normal.   The tongue is midline, and the patient  has symmetric elevation of the soft palate. No obvious hearing deficits are noted.  Motor:  Muscle bulk is normal.   Muscle tone is normal. Strength is 5/5 in the arms or legs..   Sensory: Sensory testing is intact to pinprick, soft touch and vibration sensation in all 4 extremities.  Coordination: Cerebellar testing reveals good finger-nose-finger bilaterally.  Gait and station: Station is normal.   Gait and tandem gait are normal. The Romberg sign is negative.  Reflexes: Deep tendon reflexes are symmetric and normal bilaterally.        DIAGNOSTIC DATA (LABS, IMAGING, TESTING) - I reviewed patient records, labs, notes, testing and imaging myself where available.  Lab Results  Component Value Date   WBC 6.5 07/07/2016   HGB 15.8 (H) 07/07/2016   HCT 46.9 (H) 07/07/2016   MCV 90.7 07/07/2016   PLT 235 07/07/2016      Component Value Date/Time   NA 140 01/22/2017 1057   K 4.2 01/22/2017 1057   CL 100 01/22/2017 1057   CO2 26 01/22/2017 1057   GLUCOSE 124 (H)  01/22/2017 1057   GLUCOSE 98 07/07/2016 1525   BUN 6 01/22/2017 1057   CREATININE 0.67 01/22/2017 1057   CREATININE 0.76 07/07/2016 1525   CALCIUM 9.2 01/22/2017 1057   PROT 6.2 01/22/2017 1057   ALBUMIN 4.0 01/22/2017 1057   AST 20 01/22/2017 1057   ALT 19 01/22/2017 1057   ALKPHOS 127 (H) 01/22/2017 1057   BILITOT 0.5 01/22/2017 1057   GFRNONAA 106 01/22/2017 1057   GFRAA 123 01/22/2017 1057   Lab Results  Component Value Date   CHOL 199 01/22/2017   HDL 46 01/22/2017   LDLCALC 116 (H) 01/22/2017   TRIG 187 (H) 01/22/2017   CHOLHDL 4.3 01/22/2017   Lab Results  Component Value Date   HGBA1C 5.6 11/01/2015   Lab Results  Component Value Date   IAXKPVVZ48 270 09/15/2016   Lab Results  Component Value Date   TSH 0.94 07/07/2016       ASSESSMENT AND PLAN  Chronic migraine  Bilateral carpal tunnel syndrome - Plan: NCV with EMG(electromyography)  Trochanteric bursitis of left hip  Left shoulder pain, unspecified chronicity - Plan: NCV with EMG(electromyography)  Neck pain - Plan: NCV with EMG(electromyography)  Ulnar neuropathy at elbow, left - Plan: NCV with EMG(electromyography)   1.   For the migraines and fibromyalgia, she will continue amitriptyline 50 mg nightly. 2.    Left subacromial bursa injection and left trochanteric bursa injection with 40 mg Depo-Medrol in [place] into each joint using sterile technique.   She tolerated the procedures well with no new complications.   She noted pain was better afterwards. 3.    Nerve conduction study/EMG for evaluation of numbness in the hands.  She appears to have a left ulnar neuropathy and bilateral carpal tunnel syndrome.  She could also have superimposed radiculopathy. 4.   Return to see me in 6 months and call sooner if she has significant worsening of pain .      She will continue to see pain management for her back and neck pain.    Ercel Pepitone A. Felecia Shelling, MD, PhD 7/86/7544, 92:01 PM Certified in Neurology,  Clinical Neurophysiology, Sleep Medicine, Pain Medicine and Neuroimaging  [facility] [address] [place], [place] [address] [phone]

## 2018-03-21 DIAGNOSIS — L989 Disorder of the skin and subcutaneous tissue, unspecified: Secondary | ICD-10-CM | POA: Diagnosis not present

## 2018-03-21 DIAGNOSIS — C4361 Malignant melanoma of right upper limb, including shoulder: Secondary | ICD-10-CM | POA: Diagnosis not present

## 2018-03-22 ENCOUNTER — Ambulatory Visit (INDEPENDENT_AMBULATORY_CARE_PROVIDER_SITE_OTHER): Payer: Medicare HMO | Admitting: Pediatrics

## 2018-03-22 ENCOUNTER — Encounter: Payer: Self-pay | Admitting: Pediatrics

## 2018-03-22 VITALS — BP 132/81 | HR 79 | Temp 98.9°F | Ht 62.0 in | Wt 225.0 lb

## 2018-03-22 DIAGNOSIS — F329 Major depressive disorder, single episode, unspecified: Secondary | ICD-10-CM

## 2018-03-22 DIAGNOSIS — K5900 Constipation, unspecified: Secondary | ICD-10-CM

## 2018-03-22 DIAGNOSIS — Z111 Encounter for screening for respiratory tuberculosis: Secondary | ICD-10-CM | POA: Diagnosis not present

## 2018-03-22 DIAGNOSIS — F32A Depression, unspecified: Secondary | ICD-10-CM

## 2018-03-22 DIAGNOSIS — N644 Mastodynia: Secondary | ICD-10-CM | POA: Diagnosis not present

## 2018-03-22 DIAGNOSIS — Z79899 Other long term (current) drug therapy: Secondary | ICD-10-CM

## 2018-03-22 DIAGNOSIS — R7303 Prediabetes: Secondary | ICD-10-CM

## 2018-03-22 DIAGNOSIS — K219 Gastro-esophageal reflux disease without esophagitis: Secondary | ICD-10-CM

## 2018-03-22 DIAGNOSIS — M79662 Pain in left lower leg: Secondary | ICD-10-CM

## 2018-03-22 DIAGNOSIS — M5441 Lumbago with sciatica, right side: Secondary | ICD-10-CM

## 2018-03-22 DIAGNOSIS — R69 Illness, unspecified: Secondary | ICD-10-CM | POA: Diagnosis not present

## 2018-03-22 DIAGNOSIS — J309 Allergic rhinitis, unspecified: Secondary | ICD-10-CM | POA: Diagnosis not present

## 2018-03-22 DIAGNOSIS — L409 Psoriasis, unspecified: Secondary | ICD-10-CM

## 2018-03-22 LAB — BAYER DCA HB A1C WAIVED: HB A1C (BAYER DCA - WAIVED): 5.5 % (ref <7.0)

## 2018-03-22 MED ORDER — CETIRIZINE HCL 10 MG PO TABS: ORAL_TABLET | ORAL | 8 refills | Status: DC

## 2018-03-22 MED ORDER — OMEPRAZOLE 40 MG PO CPDR: 40.0000 mg | DELAYED_RELEASE_CAPSULE | Freq: Every day | ORAL | 1 refills | Status: DC

## 2018-03-22 MED ORDER — LUBIPROSTONE 24 MCG PO CAPS: ORAL_CAPSULE | ORAL | 1 refills | Status: DC

## 2018-03-22 MED ORDER — VENLAFAXINE HCL 75 MG PO TABS: 75.0000 mg | ORAL_TABLET | Freq: Two times a day (BID) | ORAL | 0 refills | Status: DC

## 2018-03-22 MED ORDER — GABAPENTIN 800 MG PO TABS: 800.0000 mg | ORAL_TABLET | Freq: Four times a day (QID) | ORAL | 1 refills | Status: DC

## 2018-03-22 MED ORDER — LIDOCAINE 5 % EX PTCH: 1.0000 | MEDICATED_PATCH | CUTANEOUS | 2 refills | Status: DC
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
## 2018-03-23 LAB — CBC WITH DIFFERENTIAL/PLATELET
BASOS ABS: 0 10*3/uL (ref 0.0–0.2)
Basos: 0 %
EOS (ABSOLUTE): 0.1 10*3/uL (ref 0.0–0.4)
Eos: 1 %
Hematocrit: 48.3 % — ABNORMAL HIGH (ref 34.0–46.6)
Hemoglobin: 16.3 g/dL — ABNORMAL HIGH (ref 11.1–15.9)
IMMATURE GRANS (ABS): 0 10*3/uL (ref 0.0–0.1)
IMMATURE GRANULOCYTES: 0 %
LYMPHS: 27 %
Lymphocytes Absolute: 2.6 10*3/uL (ref 0.7–3.1)
MCH: 30.7 pg (ref 26.6–33.0)
MCHC: 33.7 g/dL (ref 31.5–35.7)
MCV: 91 fL (ref 79–97)
Monocytes Absolute: 0.4 10*3/uL (ref 0.1–0.9)
Monocytes: 4 %
NEUTROS PCT: 68 %
Neutrophils Absolute: 6.5 10*3/uL (ref 1.4–7.0)
PLATELETS: 277 10*3/uL (ref 150–450)
RBC: 5.31 x10E6/uL — AB (ref 3.77–5.28)
RDW: 14.1 % (ref 12.3–15.4)
WBC: 9.6 10*3/uL (ref 3.4–10.8)

## 2018-03-23 LAB — CMP14+EGFR
A/G RATIO: 2 (ref 1.2–2.2)
ALT: 12 IU/L (ref 0–32)
AST: 13 IU/L (ref 0–40)
Albumin: 4.5 g/dL (ref 3.5–5.5)
Alkaline Phosphatase: 102 IU/L (ref 39–117)
BUN/Creatinine Ratio: 12 (ref 9–23)
BUN: 10 mg/dL (ref 6–24)
Bilirubin Total: 0.4 mg/dL (ref 0.0–1.2)
CO2: 24 mmol/L (ref 20–29)
Calcium: 9.3 mg/dL (ref 8.7–10.2)
Chloride: 104 mmol/L (ref 96–106)
Creatinine, Ser: 0.86 mg/dL (ref 0.57–1.00)
GFR calc Af Amer: 94 mL/min/{1.73_m2} (ref >59)
GFR, EST NON AFRICAN AMERICAN: 81 mL/min/{1.73_m2} (ref >59)
Globulin, Total: 2.3 g/dL (ref 1.5–4.5)
Glucose: 116 mg/dL — ABNORMAL HIGH (ref 65–99)
POTASSIUM: 3.8 mmol/L (ref 3.5–5.2)
Sodium: 143 mmol/L (ref 134–144)
TOTAL PROTEIN: 6.8 g/dL (ref 6.0–8.5)

## 2018-03-23 LAB — TSH: TSH: 1.23 u[IU]/mL (ref 0.450–4.500)

## 2018-03-25 LAB — TB SKIN TEST
INDURATION: 0 mm
TB Skin Test: NEGATIVE

## 2018-04-02 DIAGNOSIS — L0100 Impetigo, unspecified: Secondary | ICD-10-CM | POA: Diagnosis not present

## 2018-04-04 DIAGNOSIS — L0390 Cellulitis, unspecified: Secondary | ICD-10-CM | POA: Diagnosis not present

## 2018-04-22 DIAGNOSIS — M5136 Other intervertebral disc degeneration, lumbar region: Secondary | ICD-10-CM | POA: Diagnosis not present

## 2018-04-22 DIAGNOSIS — M797 Fibromyalgia: Secondary | ICD-10-CM | POA: Diagnosis not present

## 2018-04-22 DIAGNOSIS — Z79891 Long term (current) use of opiate analgesic: Secondary | ICD-10-CM | POA: Diagnosis not present

## 2018-04-22 DIAGNOSIS — M545 Low back pain: Secondary | ICD-10-CM | POA: Diagnosis not present

## 2018-04-22 DIAGNOSIS — M47816 Spondylosis without myelopathy or radiculopathy, lumbar region: Secondary | ICD-10-CM | POA: Diagnosis not present

## 2018-04-22 DIAGNOSIS — M7062 Trochanteric bursitis, left hip: Secondary | ICD-10-CM | POA: Diagnosis not present

## 2018-04-22 DIAGNOSIS — M2550 Pain in unspecified joint: Secondary | ICD-10-CM | POA: Diagnosis not present

## 2018-04-22 DIAGNOSIS — G894 Chronic pain syndrome: Secondary | ICD-10-CM | POA: Diagnosis not present

## 2018-04-23 ENCOUNTER — Ambulatory Visit (INDEPENDENT_AMBULATORY_CARE_PROVIDER_SITE_OTHER): Payer: Medicare HMO | Admitting: Neurology

## 2018-04-23 ENCOUNTER — Encounter (INDEPENDENT_AMBULATORY_CARE_PROVIDER_SITE_OTHER): Payer: Self-pay | Admitting: Neurology

## 2018-04-23 DIAGNOSIS — Z0289 Encounter for other administrative examinations: Secondary | ICD-10-CM

## 2018-04-23 DIAGNOSIS — G5622 Lesion of ulnar nerve, left upper limb: Secondary | ICD-10-CM

## 2018-04-23 DIAGNOSIS — G5603 Carpal tunnel syndrome, bilateral upper limbs: Secondary | ICD-10-CM | POA: Diagnosis not present

## 2018-04-23 DIAGNOSIS — R200 Anesthesia of skin: Secondary | ICD-10-CM | POA: Diagnosis not present

## 2018-04-23 DIAGNOSIS — M542 Cervicalgia: Secondary | ICD-10-CM

## 2018-04-23 DIAGNOSIS — M5412 Radiculopathy, cervical region: Secondary | ICD-10-CM

## 2018-04-23 DIAGNOSIS — M25512 Pain in left shoulder: Secondary | ICD-10-CM | POA: Diagnosis not present

## 2018-04-23 NOTE — Progress Notes (Signed)
Full Name: Judy Evans Gender: Female MRN #: [id number] Date of Birth: 17-Jun-2071    Visit Date: 04/23/18 13:12 Age: 47 Years 47 Months Old Examining Physician: Arlice Colt, MD  Referring Physician: Felecia Shelling, MD     History:  She reports bilateral hand and arm numbness and pain, worse on the left.  Nerve conduction studies:    Bilateral median and ulnar motor responses had normal distal latencies, amplitudes and conduction velocities.  Bilateral median and ulnar sensory responses had normal peak latencies with slightly reduced amplitudes.  EMG: Needle EMG of selected muscles of the left arm showed mild chronic denervation in some of the C7 innervated muscles.  The other muscles were essentially normal.  There was no abnormal spontaneous activity.  Impression: This NCV/EMG study shows the following: 1.   Mild chronic left C7 radiculopathy without active features 2.   No evidence of median or ulnar neuropathies.    Richard A. Felecia Shelling, MD, PhD, FAAN Certified in Neurology, Clinical Neurophysiology, Sleep Medicine, Pain Medicine and Neuroimaging Director, [facility] [address], [place], [place] [address] [phone]        [facility]    Nerve / Sites Muscle Latency Ref. Amplitude Ref. Rel Amp Segments Distance Velocity Ref. Area    ms ms mV mV %  cm m/s m/s mVms  R Median - APB     Wrist APB 3.5 ?4.4 6.7 ?4.0 100 Wrist - APB 7   22.9     Upper arm APB 6.9  6.3  95.1 Upper arm - Wrist 19 56 ?49 21.9  L Median - APB     Wrist APB 3.9 ?4.4 7.5 ?4.0 100 Wrist - APB 7   26.0     Upper arm APB 7.7  7.3  96.5 Upper arm - Wrist 19 51 ?49 25.4  R Ulnar - ADM     Wrist ADM 2.2 ?3.3 9.7 ?6.0 100 Wrist - ADM 7   28.0     B.Elbow ADM 5.3  8.7  89.8 B.Elbow - Wrist 16 53 ?49 26.1     A.Elbow ADM 7.2  0.5  5.62 A.Elbow - B.Elbow 10 52 ?49 26.9         A.Elbow - Wrist      L Ulnar - ADM    Wrist ADM 2.4 ?3.3 7.8 ?6.0 100 Wrist - ADM 7   23.8     B.Elbow ADM 5.6  7.1  91.1 B.Elbow - Wrist 16 50 ?49 22.1     A.Elbow ADM 7.4  6.8  95.5 A.Elbow - B.Elbow 10 56 ?49 23.6         A.Elbow - Wrist                 SNC    Nerve / Sites Rec. Site Peak Lat Ref.  Amp Ref. Segments Distance    ms ms V V  cm  R Median - Orthodromic (Dig II, Mid palm)     Dig II Wrist 3.2 ?3.4 7 ?10 Dig II - Wrist 13  L Median - Orthodromic (Dig II, Mid palm)     Dig II Wrist 3.3 ?3.4 5 ?10 Dig II - Wrist 13  R Ulnar - Orthodromic, (Dig V, Mid palm)     Dig V Wrist 2.4 ?3.1 5 ?5 Dig V - Wrist 11  L Ulnar - Orthodromic, (Dig V, Mid palm)  Dig V Wrist 2.4 ?3.1 6 ?5 Dig V - Wrist 11              F  Wave    Nerve F Lat Ref.   ms ms  R Ulnar - ADM 25.8 ?32.0  L Ulnar - ADM 25.2 ?32.0         EMG full       EMG Summary Table    Spontaneous MUAP Recruitment  Muscle IA Fib PSW Fasc Other Amp Dur. Poly Pattern  L. Deltoid Normal None None None _______ Normal Normal Normal Normal  L. Triceps brachii Normal None None None _______ Increased Increased 1+ Reduced  L. Biceps brachii Normal None None None _______ Normal Normal Normal Normal  L. Extensor digitorum communis Normal None None None _______ Normal Increased 1+ Reduced  L. Flexor carpi ulnaris Normal None None None _______ Increased Increased 1+ Reduced  L. First dorsal interosseous Normal None None None _______ Normal Normal Normal Normal  L. Abductor pollicis brevis Normal None None None _______ Normal Normal Normal Normal

## 2018-05-07 DIAGNOSIS — Z79899 Other long term (current) drug therapy: Secondary | ICD-10-CM | POA: Diagnosis not present

## 2018-05-07 DIAGNOSIS — L400 Psoriasis vulgaris: Secondary | ICD-10-CM | POA: Diagnosis not present

## 2018-05-16 IMAGING — DX DG ABDOMEN 1V
1 series · 1 of 1 positions shown · non-contrast
Comparison: Abdominal and pelvic CT scan January 23, 2015

CLINICAL DATA: Assess stool burden. History of irritable bowel
disease. Patient reports abdominal pain for some time.

EXAM:
ABDOMEN - 1 VIEW

[abdomen kub]
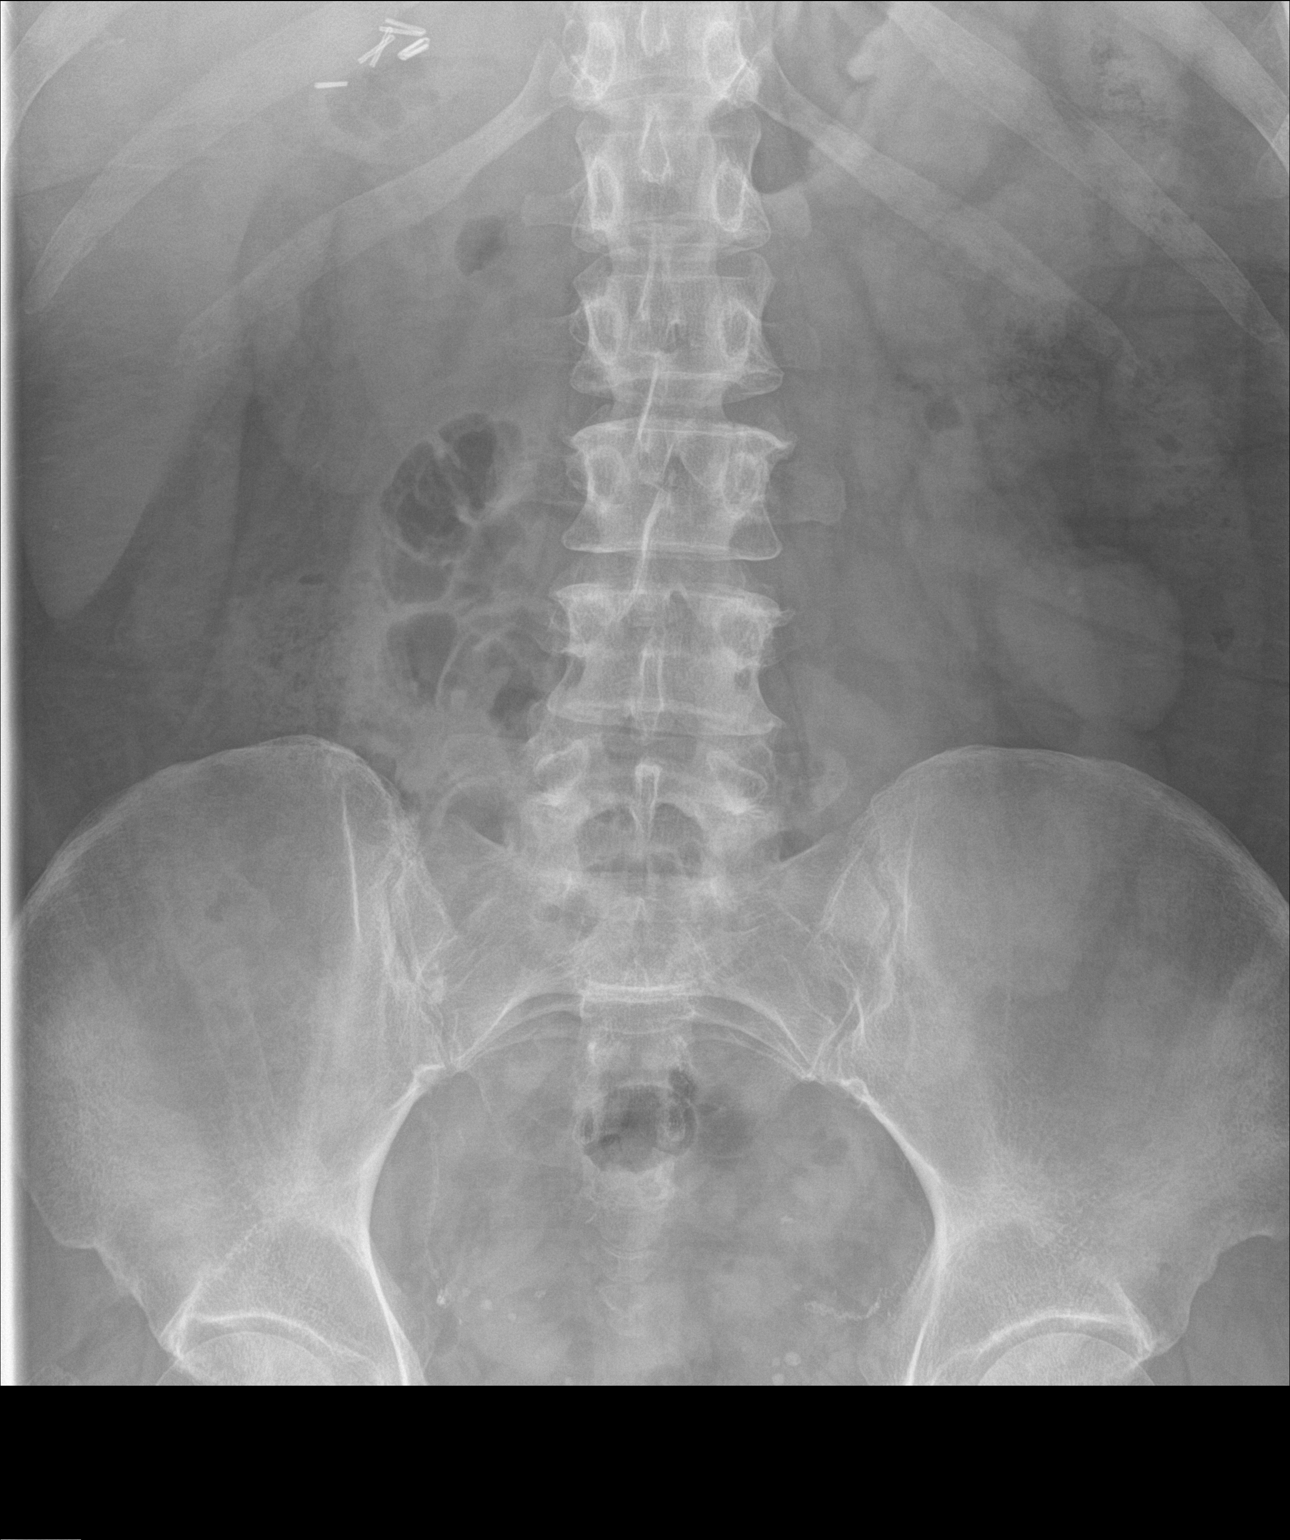

[1 of 1 positions shown; findings below may reference images not displayed]

FINDINGS: The colonic stool burden is moderate. No small or large bowel
obstructive pattern is observed. There is no evidence of a fecal
impaction. There surgical clips in the gallbladder fossa. There are
numerous phleboliths within the pelvis. The bony structures exhibit
no acute abnormality.
IMPRESSION: Moderate colonic stool burden without evidence of obstruction or
fecal impaction.

## 2018-05-20 DIAGNOSIS — M5412 Radiculopathy, cervical region: Secondary | ICD-10-CM | POA: Diagnosis not present

## 2018-05-20 DIAGNOSIS — M47816 Spondylosis without myelopathy or radiculopathy, lumbar region: Secondary | ICD-10-CM | POA: Diagnosis not present

## 2018-05-20 DIAGNOSIS — M797 Fibromyalgia: Secondary | ICD-10-CM | POA: Diagnosis not present

## 2018-05-20 DIAGNOSIS — M545 Low back pain: Secondary | ICD-10-CM | POA: Diagnosis not present

## 2018-05-20 DIAGNOSIS — M5136 Other intervertebral disc degeneration, lumbar region: Secondary | ICD-10-CM | POA: Diagnosis not present

## 2018-05-20 DIAGNOSIS — M5030 Other cervical disc degeneration, unspecified cervical region: Secondary | ICD-10-CM | POA: Diagnosis not present

## 2018-05-23 DIAGNOSIS — Z79899 Other long term (current) drug therapy: Secondary | ICD-10-CM | POA: Diagnosis not present

## 2018-05-23 DIAGNOSIS — L400 Psoriasis vulgaris: Secondary | ICD-10-CM | POA: Diagnosis not present

## 2018-06-10 DIAGNOSIS — M5030 Other cervical disc degeneration, unspecified cervical region: Secondary | ICD-10-CM | POA: Diagnosis not present

## 2018-06-10 DIAGNOSIS — M47812 Spondylosis without myelopathy or radiculopathy, cervical region: Secondary | ICD-10-CM | POA: Diagnosis not present

## 2018-06-20 ENCOUNTER — Encounter: Payer: Self-pay | Admitting: Pediatrics

## 2018-06-20 ENCOUNTER — Telehealth: Payer: Self-pay | Admitting: Neurology

## 2018-06-20 ENCOUNTER — Ambulatory Visit (INDEPENDENT_AMBULATORY_CARE_PROVIDER_SITE_OTHER): Payer: Medicare HMO | Admitting: Pediatrics

## 2018-06-20 ENCOUNTER — Other Ambulatory Visit: Payer: Self-pay | Admitting: Pediatrics

## 2018-06-20 VITALS — BP 122/75 | HR 86 | Temp 97.9°F | Resp 18 | Ht 62.0 in | Wt 226.6 lb

## 2018-06-20 DIAGNOSIS — Z23 Encounter for immunization: Secondary | ICD-10-CM | POA: Diagnosis not present

## 2018-06-20 DIAGNOSIS — G43809 Other migraine, not intractable, without status migrainosus: Secondary | ICD-10-CM

## 2018-06-20 DIAGNOSIS — J441 Chronic obstructive pulmonary disease with (acute) exacerbation: Secondary | ICD-10-CM

## 2018-06-20 DIAGNOSIS — J0100 Acute maxillary sinusitis, unspecified: Secondary | ICD-10-CM | POA: Diagnosis not present

## 2018-06-20 MED ORDER — DOXYCYCLINE HYCLATE 100 MG PO TABS: 100.0000 mg | ORAL_TABLET | Freq: Two times a day (BID) | ORAL | 0 refills | Status: DC

## 2018-06-20 MED ORDER — VENLAFAXINE HCL 37.5 MG PO TABS: 37.5000 mg | ORAL_TABLET | Freq: Two times a day (BID) | ORAL | 1 refills | Status: DC

## 2018-06-20 MED ORDER — SUMATRIPTAN SUCCINATE 100 MG PO TABS: 100.0000 mg | ORAL_TABLET | Freq: Once | ORAL | 2 refills | Status: DC | PRN

## 2018-06-20 NOTE — Telephone Encounter (Signed)
Spoke with pt. She sts. she is having new left sided h/a's; has seen pcp for same and sts. pcp is worried that b/c h/a is on the left, she may have sz. (no hx. of sz.) Next available appt. given, 08/13/18, arrival time of 10am for 1030 appt. and I will call her if a sooner appt. becomes available/fim

## 2018-06-20 NOTE — Progress Notes (Signed)
  Subjective:   Patient ID: Judy Evans, female    DOB: 1970-09-30, 47 y.o.   MRN: [id number] CC: Facial Pain; Ear Pain; Cough; Headache; Sore Throat; and Fatigue  HPI: Judy Evans is a 47 y.o. female   Patient has had more pressure in her face over the last 2 to 3 weeks.  Ongoing nasal congestion.  No wheezing.  Some coughing, sore throat fatigue.  Symptoms are worsening, not improving.  Taking humira for psoriasis, has helped some.   Pain L posterior occiput. Having intermittent sharp pain. Happening every day to every other day. Does have h/o migraines but those are typically frontal.  This pain feels like a throbbing burning feeling.   Grandfather with h/o brain aneurysm.   H/o aphasia that came and went for about a year, would start with a bad headache. Seen by neurology at the time, happened about 10 years ago. Was started on amitriptyline at the time, has helped improve symptoms.   She stopped venlafaxine a few weeks ago, thinks it may have been helping some to prevent headaches.  Relevant past medical, surgical, family and social history reviewed. Allergies and medications reviewed and updated. Social History   Tobacco Use  Smoking Status Current Every Day Smoker  . Packs/day: 1.50  . Years: 30.00  . Pack years: 45.00  . Types: Cigarettes  Smokeless Tobacco Never Used   ROS: Per HPI   Objective:    BP 122/75   Pulse 86   Temp 97.9 F (36.6 C) (Oral)   Resp 18   Ht 5\' 2"  (1.575 m)   Wt 226 lb 9.6 oz (102.8 kg)   SpO2 97%   BMI 41.45 kg/m   Wt Readings from Last 3 Encounters:  06/20/18 226 lb 9.6 oz (102.8 kg)  03/22/18 225 lb (102.1 kg)  03/18/18 224 lb 8 oz (101.8 kg)    Gen: NAD, alert, cooperative with exam, NCAT EYES: EOMI, no conjunctival injection, or no icterus ENT:  TMs dull gray b/l, OP with mild erythema, ttp over maxillary sinuses bilaterally LYMPH: no cervical LAD CV: NRRR, normal S1/S2, no murmur, distal pulses 2+ b/l Resp: CTABL,  no wheezes, normal WOB Abd: +BS, soft, NTND. no guarding or organomegaly Ext: No edema, warm Neuro: Alert and oriented, strength equal b/l UE and LE, coordination grossly normal, sensation intact bilaterally.  MSK: normal muscle bulk  Assessment & Plan:  Judy Evans was seen today for facial pain, ear pain, cough, headache, sore throat and fatigue.  Diagnoses and all orders for this visit:  Acute non-recurrent maxillary sinusitis Treat with below.  Sinus rinses as tolerated, return precautions discussed. -     doxycycline (VIBRA-TABS) 100 MG tablet; Take 1 tablet (100 mg total) by mouth 2 (two) times daily.  Other migraine without status migrainosus, not intractable Restart venlafaxine, patient to follow-up with her neurologist who is been following her for headaches. -     SUMAtriptan (IMITREX) 100 MG tablet; Take 1 tablet (100 mg total) by mouth once as needed for up to 1 dose for migraine. May repeat in 2 hours if headache persists or recurs. -     venlafaxine (EFFEXOR) 37.5 MG tablet; Take 1 tablet (37.5 mg total) by mouth 2 (two) times daily.  Need for immunization against influenza -     Flu Vaccine QUAD 36+ mos IM   Follow up plan: Return in about 3 months (around 09/19/2018). [name], MD [place]

## 2018-06-20 NOTE — Telephone Encounter (Signed)
Pt called stating she has on going h/a for about 3 weeks, pts PCP requesting her to see Dr. Felecia Evans as soon as possible requesting a call to discuss getting in

## 2018-06-24 ENCOUNTER — Ambulatory Visit: Payer: Medicare HMO | Admitting: Pediatrics

## 2018-07-08 DIAGNOSIS — M5030 Other cervical disc degeneration, unspecified cervical region: Secondary | ICD-10-CM | POA: Diagnosis not present

## 2018-07-08 DIAGNOSIS — M545 Low back pain: Secondary | ICD-10-CM | POA: Diagnosis not present

## 2018-07-08 DIAGNOSIS — M5136 Other intervertebral disc degeneration, lumbar region: Secondary | ICD-10-CM | POA: Diagnosis not present

## 2018-07-08 DIAGNOSIS — M47812 Spondylosis without myelopathy or radiculopathy, cervical region: Secondary | ICD-10-CM | POA: Diagnosis not present

## 2018-07-08 DIAGNOSIS — M797 Fibromyalgia: Secondary | ICD-10-CM | POA: Diagnosis not present

## 2018-07-08 DIAGNOSIS — G894 Chronic pain syndrome: Secondary | ICD-10-CM | POA: Diagnosis not present

## 2018-07-08 DIAGNOSIS — M47816 Spondylosis without myelopathy or radiculopathy, lumbar region: Secondary | ICD-10-CM | POA: Diagnosis not present

## 2018-07-16 ENCOUNTER — Other Ambulatory Visit: Payer: Self-pay | Admitting: Pediatrics

## 2018-07-16 DIAGNOSIS — G43809 Other migraine, not intractable, without status migrainosus: Secondary | ICD-10-CM

## 2018-07-18 ENCOUNTER — Encounter: Payer: Self-pay | Admitting: Neurology

## 2018-07-18 ENCOUNTER — Ambulatory Visit (INDEPENDENT_AMBULATORY_CARE_PROVIDER_SITE_OTHER): Payer: Medicare HMO | Admitting: Neurology

## 2018-07-18 ENCOUNTER — Other Ambulatory Visit: Payer: Self-pay

## 2018-07-18 ENCOUNTER — Telehealth: Payer: Self-pay | Admitting: Neurology

## 2018-07-18 VITALS — BP 130/82 | HR 97 | Ht 62.0 in | Wt 224.5 lb

## 2018-07-18 DIAGNOSIS — M542 Cervicalgia: Secondary | ICD-10-CM

## 2018-07-18 DIAGNOSIS — Z8249 Family history of ischemic heart disease and other diseases of the circulatory system: Secondary | ICD-10-CM | POA: Diagnosis not present

## 2018-07-18 DIAGNOSIS — M797 Fibromyalgia: Secondary | ICD-10-CM | POA: Diagnosis not present

## 2018-07-18 DIAGNOSIS — R69 Illness, unspecified: Secondary | ICD-10-CM | POA: Diagnosis not present

## 2018-07-18 DIAGNOSIS — M5481 Occipital neuralgia: Secondary | ICD-10-CM | POA: Diagnosis not present

## 2018-07-18 DIAGNOSIS — G4489 Other headache syndrome: Secondary | ICD-10-CM | POA: Insufficient documentation

## 2018-07-18 DIAGNOSIS — F32A Depression, unspecified: Secondary | ICD-10-CM

## 2018-07-18 DIAGNOSIS — F329 Major depressive disorder, single episode, unspecified: Secondary | ICD-10-CM

## 2018-07-18 DIAGNOSIS — M5412 Radiculopathy, cervical region: Secondary | ICD-10-CM | POA: Diagnosis not present

## 2018-07-18 DIAGNOSIS — IMO0002 Reserved for concepts with insufficient information to code with codable children: Secondary | ICD-10-CM

## 2018-07-18 DIAGNOSIS — R51 Headache: Secondary | ICD-10-CM

## 2018-07-18 DIAGNOSIS — R519 Headache, unspecified: Secondary | ICD-10-CM

## 2018-07-18 DIAGNOSIS — G43709 Chronic migraine without aura, not intractable, without status migrainosus: Secondary | ICD-10-CM | POA: Diagnosis not present

## 2018-07-18 MED ORDER — TIZANIDINE HCL 4 MG PO TABS: ORAL_TABLET | ORAL | 3 refills | Status: DC

## 2018-07-18 NOTE — Progress Notes (Signed)
[facility]  PATIENT: Judy Evans DOB: [birth year]  REFERRING DOCTOR OR PCP:  Dr. Evette Doffing SOURCE: Patient, notes from Dr. Evette Doffing imaging reports, MRI images on PACS.  _________________________________   HISTORICAL  CHIEF COMPLAINT:  Chief Complaint  Patient presents with  . Follow-up    RM 12, alone. Last seen 03/18/18. Here to f/u on headaches. She went to her PCP d/t having bad headaches on the back of her head on left side. She has N/V with this. Her maternal grandfather had a brain aneurysm and PCP wanted her evaluated by neurology. Mentioned possible seizures to pt.    HISTORY OF PRESENT ILLNESS:  Judy Evans is a 47 yo woman with headaches, shoulder pain and neck pain.  Update 07/18/2018: She reports a lot of headache in the back of the head on the left, rarely on both sides.   Pain is aching in quality.   She has nausea and some vomiting   Bending over and noise and movements worsens the pain.    Nothing makes it better.    She denies specific arm weakness but notes general arm > leg weakness.   Bladder is doing well.  Gait is fine.    She sees Dr. Francesco Runner for Pain management and had a recent cervical ESI --- this helped pain a short time.   She also notes some muscle aches and has been diagnosed with FMS.   She has some sleep maintenance insomnia.     NCV/EMG 04/23/18 Impression: This NCV/EMG study shows the following: 1.   Mild chronic left C7 radiculopathy without active features 2.   No evidence of median or ulnar neuropathies.    Her maternal grandfather and maternal great grandfather had cerebral aneurysm.     Update 03/18/2018: She feels her migraines are helping a lot since the amitriptyline was titrated up.     She does not get the severe ones anymore and most of the headaches are short and mild.    At a prior visit, we did a shoulder injection and the pain was much better until a few months ago.  Pain is worse with driving and with  elevating the arm.    She will note some numbness in her hand at times.     She also has left hip and buttock pain.   A trochanteric bursa injection helped her a lot in the past.      She has numbness in her arms, left > right.  This fluctuates some.   MRI shows C3C4 > C5C6 DJD with foraminal narrowing to the left.       From 03/13/2017:  Her more constant headache and migraines are better on amitriptyline but she continues to get the sharp pain at times that is felt mostly in the right temple.    She tolerates the amitriptyline well.   HA History:   She has had headaches off and on x many years but they worsened 4-5 years ago. She had three especially bad headaches associated with difficulty speaking.   She saw a neurologist at [facility].   She was placed on amitriptyline with some benefit.   However, when she moved, her new PCP would not right amitriptyline for her.   She was referred to a pain management practice.    When they occur, she often gets sharp pains on the right.   Pain is stabbing.   She gets nausea but rarely has vomiting.   She has had no furhter  migraine with aphasia.  Moving increases her pain.   Bright lights and noises increases her pain.  Amitriptyline has helped the headaches Triptans help sometimes but not always.   Tylenol arthritis helps a little bit.   .  FMS/neck pain/LBP:  She still notes the fibromyalgia pain, mostly in the back, neck and hips.     She also has left hip bursitis and at times it seems like the leg is going to give out.     She is on Cymbalta and gabapentin (800 mg po qid) with some benefit.   She is also on Dilaudid 2 mg qid with benfit.  She has had lumbar spinal injections with benefit.    She sees Dr. Francesco Runner for Pain Management [place] [facility]) for pain management.        Shoulder pain:   The left shoulder pain did better withteh subacromial bursa injection but benefit was only about 2 weeks. Pain increases with elevation and external rotation.    I  personally reviewed an MRI of the cervical spine dated 07/14/2016. At C3-C4, she has a disc osteophyte complex causing moderate left foraminal narrowing with some encroachment upon the left C4 nerve root. At C5-C6 she has mild left foraminal narrowing due to a small disc osteophyte complex. There does not appear to be any nerve root compression.  REVIEW OF SYSTEMS: Constitutional: No fevers, chills, sweats, or change in appetite Eyes: No visual changes, double vision, eye pain Ear, nose and throat: No hearing loss, ear pain, nasal congestion, sore throat Cardiovascular: No chest pain, palpitations Respiratory: No shortness of breath at rest or with exertion.   No wheezes GastrointestinaI: has Irritable bowel Genitourinary: No dysuria, urinary retention or frequency.  No nocturia. Musculoskeletal: as above, fibromyalgia/neck pain/ shoulder pain/back pain Integumentary: Has psoriasis Neurological: as above Psychiatric: No depression at this time.  No anxiety Endocrine: has pre-diabetesNo palpitations, diaphoresis, change in appetite, change in weigh or increased thirst Hematologic/Lymphatic: No anemia, purpura, petechiae. Allergic/Immunologic: No itchy/runny eyes, nasal congestion, recent allergic reactions, rashes  ALLERGIES: Allergies  Allergen Reactions  . Rosuvastatin Calcium Swelling    Ends up in the hospital  . Statins Swelling    Ends up in the hospital  . Wellbutrin [Bupropion]     suicidal  . Hctz [Hydrochlorothiazide] Nausea And Vomiting and Rash  . Hydrocodone-Acetaminophen Rash  . Oxycodone Rash  . Penicillins Nausea And Vomiting and Rash    Has patient had a PCN reaction causing immediate rash, facial/tongue/throat swelling, SOB or lightheadedness with hypotension: Yes Has patient had a PCN reaction causing severe rash involving mucus membranes or skin necrosis: No Has patient had a PCN reaction that required hospitalization No Has patient had a PCN reaction  occurring within the last 10 years: No If all of the above answers are "NO", then may proceed with Cephalosporin use.     HOME MEDICATIONS:  Current Outpatient Medications:  .  acetaminophen (TYLENOL) 500 MG tablet, Take 1,000 mg by mouth every 6 (six) hours as needed for mild pain., Disp: , Rfl:  .  albuterol (PROVENTIL HFA;VENTOLIN HFA) 108 (90 Base) MCG/ACT inhaler, INHALE 2 PUFFS INTO THE LUNGS EVERY 4 (FOUR) HOURS AS NEEDED FOR WHEEZING OR SHORTNESS OF BREATH., Disp: 18 Inhaler, Rfl: 4 .  amitriptyline (ELAVIL) 25 MG tablet, One or two po qHS, Disp: 60 tablet, Rfl: 11 .  BREO ELLIPTA 200-25 MCG/INH AEPB, INHALE 1 PUFF INTO LUNGS DAILY, Disp: 180 each, Rfl: 1 .  cetirizine (ZYRTEC) 10 MG tablet, TAKE  1 TABLET (10 MG TOTAL) BY MOUTH DAILY., Disp: 30 tablet, Rfl: 8 .  fluticasone (FLONASE) 50 MCG/ACT nasal spray, Place 2 sprays into both nostrils daily., Disp: 16 g, Rfl: 6 .  gabapentin (NEURONTIN) 800 MG tablet, Take 1 tablet (800 mg total) by mouth 4 (four) times daily., Disp: 360 tablet, Rfl: 1 .  HUMIRA PEN-PS/UV/ADOL HS START 80 MG/0.8ML & 40MG /0.4ML PNKT, , Disp: , Rfl:  .  ibuprofen (ADVIL,MOTRIN) 600 MG tablet, Take 1 tablet (600 mg total) by mouth every 8 (eight) hours as needed., Disp: 15 tablet, Rfl: 0 .  lidocaine (LIDODERM) 5 %, Place 1 patch onto the skin daily. Remove & Discard patch within 12 hours or as directed by MD, Disp: 30 patch, Rfl: 2 .  lubiprostone (AMITIZA) 24 MCG capsule, TAKE 1 CAPSULE (24 MCG TOTAL) BY MOUTH 2 (TWO) TIMES DAILY WITH A MEAL., Disp: 180 capsule, Rfl: 1 .  nystatin ointment (MYCOSTATIN), APPLY 1 APPLICATION TOPICALLY 2 (TWO) TIMES DAILY AS NEEDED., Disp: 30 g, Rfl: 0 .  omeprazole (PRILOSEC) 40 MG capsule, Take 1 capsule (40 mg total) by mouth daily., Disp: 90 capsule, Rfl: 1 .  SUMAtriptan (IMITREX) 100 MG tablet, Take 1 tablet (100 mg total) by mouth once as needed for up to 1 dose for migraine. May repeat in 2 hours if headache persists or  recurs., Disp: 10 tablet, Rfl: 2 .  traMADol (ULTRAM) 50 MG tablet, TAKE ONE TABLET (50 MG DOSE) BY MOUTH EVERY 8 (EIGHT) HOURS AS NEEDED FOR PAIN., Disp: , Rfl: 1 .  triamcinolone lotion (KENALOG) 0.1 %, Apply 1 application topically 2 (two) times daily., Disp: , Rfl:  .  venlafaxine (EFFEXOR) 37.5 MG tablet, TAKE 1 TABLET BY MOUTH 2 (TWO) TIMES DAILY., Disp: 180 tablet, Rfl: 0 .  tiZANidine (ZANAFLEX) 4 MG tablet, Take 1/2 to 2 nightly, Disp: 60 tablet, Rfl: 3  PAST MEDICAL HISTORY: Past Medical History:  Diagnosis Date  . Anxiety   . Aphasia   . Arthritis   . COPD (chronic obstructive pulmonary disease) ([place])   . DDD (degenerative disc disease), cervical   . DDD (degenerative disc disease), lumbar   . Degenerative disc disease at L5-S1 level   . Diabetes mellitus without complication ([place])    States she does not have  . Fibromyalgia   . GERD (gastroesophageal reflux disease)   . H. pylori infection   . Hyperlipidemia    States she does not have  . Hypertension    States she does not have  . IBS (irritable bowel syndrome)   . LLQ pain 06/18/2015  . Migraines   . Moody 01/22/2015  . Pain with urination 02/19/2015  . Psoriasis   . Scoliosis   . Spondylolysis     PAST SURGICAL HISTORY: Past Surgical History:  Procedure Laterality Date  . ABDOMINAL HYSTERECTOMY    . BIOPSY  09/14/2016   Procedure: BIOPSY;  Surgeon: Daneil Dolin, MD;  [facility]: [facility];  Service: Endoscopy;;  gastric   . CARDIAC CATHETERIZATION  2015   patient reported it to be normal. "I was dying in pain during procedure".   . CARPAL TUNNEL RELEASE Right   . CESAREAN SECTION    . CHOLECYSTECTOMY    . COLONOSCOPY  06/2014   Altamese Dilling DeMason: normal  . DILATION AND CURETTAGE OF UTERUS    . ESOPHAGOGASTRODUODENOSCOPY  07/2014   Burke Keels: Normal  . ESOPHAGOGASTRODUODENOSCOPY (EGD) WITH PROPOFOL N/A 09/14/2016   Dr. Gala Romney: Normal esophagus status post  empiric dilation, erosive gastropathy with  biopsies showing chronic inactive gastritis, no H pylori.  Marland Kitchen FOOT SURGERY Bilateral    removal of heel spurs  . MALONEY DILATION  09/14/2016   Procedure: Venia Minks DILATION;  Surgeon: Daneil Dolin, MD;  Location: [facility];  Service: Endoscopy;;  . rotator cuff surgery Right   . TONSILLECTOMY      FAMILY HISTORY: Family History  Problem Relation Age of Onset  . COPD Mother   . Diabetes Mother   . Osteoarthritis Mother   . Atrial fibrillation Mother   . Heart disease Mother   . Healthy Father   . Lupus Sister   . CAD Sister   . Diabetes Sister   . Early death Brother        pneumonia  . Asthma Daughter   . Asthma Son   . Cancer Maternal Grandmother 60       colon  . Arthritis Maternal Grandmother   . Diabetes Maternal Grandmother   . Dementia Maternal Grandmother   . Other Maternal Grandfather        brain tumor  . Cancer Paternal Grandmother        breast  . Cancer Paternal Grandfather        lung  . Other Paternal Grandfather        brain aneurysm  . Factor V Leiden deficiency Sister   . Arthritis Sister   . Psoriasis Sister     SOCIAL HISTORY:  Social History   Socioeconomic History  . Marital status: Legally Separated    Spouse name: Not on file  . Number of children: 2  . Years of education: GED  . Highest education level: GED or equivalent  Occupational History  . Occupation: Disabled  Social Needs  . Financial resource strain: Somewhat hard  . Food insecurity:    Worry: Sometimes true    Inability: Sometimes true  . Transportation needs:    Medical: No    Non-medical: No  Tobacco Use  . Smoking status: Current Every Day Smoker    Packs/day: 1.50    Years: 30.00    Pack years: 45.00    Types: Cigarettes  . Smokeless tobacco: Never Used  Substance and Sexual Activity  . Alcohol use: No  . Drug use: No  . Sexual activity: Not Currently    Birth control/protection: Surgical    Comment: hyst  Lifestyle  . Physical activity:    Days  per week: 0 days    Minutes per session: 0 min  . Stress: Not at all  Relationships  . Social connections:    Talks on phone: More than three times a week    Gets together: More than three times a week    Attends religious service: Never    Active member of club or organization: No    Attends meetings of clubs or organizations: Never    Relationship status: Separated  . Intimate partner violence:    Fear of current or ex partner: No    Emotionally abused: No    Physically abused: No    Forced sexual activity: No  Other Topics Concern  . Not on file  Social History Narrative   Caffeine use: Drinks coffee (2 cups per day), tea/soda- 2-3 cups per day   Right handed   Lives alone     PHYSICAL EXAM  Vitals:   07/18/18 1537  BP: 130/82  Pulse: 97  Weight: 224 lb 8 oz (101.8 kg)  Height: 5\' 2"  (1.575 m)    Body mass index is 41.06 kg/m.   General: The patient is well-developed and well-nourished and in no acute distress   Musculoskeletal: She has severe tenderness at the splenius capitis/occipital nerve on the left and moderate tenderness on the right.  Range of motion is mildly reduced in the neck.    She has tenderness over many of the classic fibromyalgia tender points and the left trochanteric bursa.  .   Neurologic Exam  Mental status: The patient is alert and oriented x 3 at the time of the examination. The patient has apparent normal recent and remote memory, with an apparently normal attention span and concentration ability.   Speech is normal.  Cranial nerves: Extraocular muscles are intact.  Facial strength and sensation is normal.  Trapezius strength is normal.  The tongue is midline, and the patient has symmetric elevation of the soft palate. No obvious hearing deficits are noted.  Motor:  Muscle bulk is normal.   Muscle tone is normal. Strength is 5/5 in the arms or legs..   Sensory: Sensory testing is intact to pinprick, soft touch and vibration sensation in  all 4 extremities.  Coordination: Cerebellar testing reveals good finger-nose-finger bilaterally.  Gait and station: Station is normal.   The gait is normal.  Tandem gait is slightly wide.  Romberg is negative.  Reflexes: Deep tendon reflexes are symmetric and normal bilaterally.        DIAGNOSTIC DATA (LABS, IMAGING, TESTING) - I reviewed patient records, labs, notes, testing and imaging myself where available.  Lab Results  Component Value Date   WBC 9.6 03/22/2018   HGB 16.3 (H) 03/22/2018   HCT 48.3 (H) 03/22/2018   MCV 91 03/22/2018   PLT 277 03/22/2018      Component Value Date/Time   NA 143 03/22/2018 1316   K 3.8 03/22/2018 1316   CL 104 03/22/2018 1316   CO2 24 03/22/2018 1316   GLUCOSE 116 (H) 03/22/2018 1316   GLUCOSE 98 07/07/2016 1525   BUN 10 03/22/2018 1316   CREATININE 0.86 03/22/2018 1316   CREATININE 0.76 07/07/2016 1525   CALCIUM 9.3 03/22/2018 1316   PROT 6.8 03/22/2018 1316   ALBUMIN 4.5 03/22/2018 1316   AST 13 03/22/2018 1316   ALT 12 03/22/2018 1316   ALKPHOS 102 03/22/2018 1316   BILITOT 0.4 03/22/2018 1316   GFRNONAA 81 03/22/2018 1316   GFRAA 94 03/22/2018 1316   Lab Results  Component Value Date   CHOL 199 01/22/2017   HDL 46 01/22/2017   LDLCALC 116 (H) 01/22/2017   TRIG 187 (H) 01/22/2017   CHOLHDL 4.3 01/22/2017   Lab Results  Component Value Date   HGBA1C 5.6 11/01/2015   Lab Results  Component Value Date   VITAMINB12 283 09/15/2016   Lab Results  Component Value Date   TSH 1.230 03/22/2018       ASSESSMENT AND PLAN  Chronic migraine  Intractable headache, unspecified chronicity pattern, unspecified headache type - Plan: MR MRA HEAD WO CONTRAST  Cervical radiculopathy  Fibromyalgia  Neck pain - Plan: MR CERVICAL SPINE WO CONTRAST  Depression, unspecified depression type  Bilateral occipital neuralgia - Plan: MR CERVICAL SPINE WO CONTRAST  Family history of cerebral aneurysm   1.   She will continue  gabapentin and amitriptyline.  I will add nighttime tizanidine to help with her insomnia and pain at night. 2.   Bilateral splenius capitis trigger point injection with a total of  80 mg Depo-Medrol in 4 cc Marcaine using sterile technique.   She tolerated the procedures well with no new complications.   She noted pain was better afterwards. 3.    Due to her worsening pain.  We will check an MRI of the cervical spine.  This will help [place] rule out significant stenosis, nerve root compression and upper cervical facet hypertrophy (that could be causing her occipital neuralgia) additionally we need to check an MR angiogram due to her positive family history of cerebral aneurysm and her worsening headaches  4.   Return to see me in 6 months and call sooner if she has significant new or worsening symptoms or based on the results of the MRI.   Tasheka Houseman A. Felecia Shelling, MD, PhD [id number], 8:01 PM Certified in Neurology, Clinical Neurophysiology, Sleep Medicine, Pain Medicine and Neuroimaging  [facility] [address] [place], [place] [address] [phone]

## 2018-07-18 NOTE — Telephone Encounter (Signed)
[employer] medicare/medicaid order sent to [facility]. They will obtain the auth and will reach out to the pt to schedule.

## 2018-07-22 NOTE — Telephone Encounter (Signed)
Patient is scheduled at [facility] for 07/30/18.

## 2018-07-29 NOTE — Telephone Encounter (Signed)
Judy Evans--It is preferable that she have both at the same time

## 2018-07-29 NOTE — Telephone Encounter (Signed)
[employer] approved the MRI Cervical spine Auth: [id number] (exp. 07/25/18 to 10/23/18).  As of right now the MRA Head has not been approved they did receive the clinical notes and I called and went over everything with the nurse but she is unable to approve it on her level so she is sending it to the MD level. It can take up to a business day to here if that was approved or not if it doesn't get a approve then there is an option for a peer to peer.   Right now she is scheduled at [facility] for 07/30/18 do you want her to proceed on having the MR Cervical for now or wait and see if the MRA head gets approved?

## 2018-07-29 NOTE — Telephone Encounter (Signed)
Both exams were approved and she is still scheduled for 07/30/18.

## 2018-07-30 ENCOUNTER — Ambulatory Visit
Admission: RE | Admit: 2018-07-30 | Discharge: 2018-07-30 | Disposition: A | Discharge status: Home / Self Care | Payer: Medicare HMO | Source: Ambulatory Visit | Attending: Neurology | Admitting: Neurology

## 2018-07-30 DIAGNOSIS — R519 Headache, unspecified: Secondary | ICD-10-CM

## 2018-07-30 DIAGNOSIS — R51 Headache: Secondary | ICD-10-CM | POA: Diagnosis not present

## 2018-07-30 DIAGNOSIS — M5481 Occipital neuralgia: Secondary | ICD-10-CM | POA: Diagnosis not present

## 2018-07-30 DIAGNOSIS — M542 Cervicalgia: Secondary | ICD-10-CM

## 2018-08-02 ENCOUNTER — Telehealth: Payer: Self-pay | Admitting: *Deleted

## 2018-08-02 NOTE — Telephone Encounter (Signed)
-----   Message from Britt Bottom, MD sent at 08/02/2018 12:10 PM EST ----- Please let her know that the MRI cervical spine showed some degenerative changes but there is no nerve root compression or spinal stenosis.  The one level was very mildly worse than it was in 2017 and the other levels were the same.  MR Angiogram of the blood vessels in the brain did not show any aneurysm.

## 2018-08-02 NOTE — Telephone Encounter (Signed)
Spoke with pt. and reviewed below MRI/MRA reports. She verbalized understanding of same/fim

## 2018-08-13 ENCOUNTER — Ambulatory Visit: Payer: Self-pay | Admitting: Neurology

## 2018-08-19 DIAGNOSIS — L400 Psoriasis vulgaris: Secondary | ICD-10-CM | POA: Diagnosis not present

## 2018-08-19 DIAGNOSIS — Z7984 Long term (current) use of oral hypoglycemic drugs: Secondary | ICD-10-CM | POA: Diagnosis not present

## 2018-09-04 ENCOUNTER — Other Ambulatory Visit: Payer: Self-pay | Admitting: Pediatrics

## 2018-09-04 DIAGNOSIS — K5900 Constipation, unspecified: Secondary | ICD-10-CM

## 2018-09-12 ENCOUNTER — Encounter: Payer: Self-pay | Admitting: Pediatrics

## 2018-09-12 ENCOUNTER — Ambulatory Visit (INDEPENDENT_AMBULATORY_CARE_PROVIDER_SITE_OTHER): Payer: Medicare HMO | Admitting: Pediatrics

## 2018-09-12 VITALS — BP 130/84 | HR 80 | Temp 98.4°F | Ht 62.0 in | Wt 222.4 lb

## 2018-09-12 DIAGNOSIS — B370 Candidal stomatitis: Secondary | ICD-10-CM

## 2018-09-12 DIAGNOSIS — J0100 Acute maxillary sinusitis, unspecified: Secondary | ICD-10-CM

## 2018-09-12 MED ORDER — DOXYCYCLINE HYCLATE 100 MG PO TABS: 100.0000 mg | ORAL_TABLET | Freq: Two times a day (BID) | ORAL | 0 refills | Status: DC

## 2018-09-12 MED ORDER — NYSTATIN 100000 UNIT/ML MT SUSP: OROMUCOSAL | 1 refills | Status: DC

## 2018-09-12 NOTE — Progress Notes (Signed)
  Subjective:   Patient ID: Judy Evans, female    DOB: 1970-12-27, 47 y.o.   MRN: [id number] CC: Facial Pain; Nasal Congestion; and Cough  HPI: Judy Evans is a 47 y.o. female   Symptoms ongoing about 8 days.  Nasal congestion, facial pain bothering her the most.  Having lots of drainage down the back of her throat.  Worsening symptoms over the last 1 to 2 days.  No fevers.  Appetite comes and goes.  Using flonase nasal spray, OTC sinus medicine, taking cetirizine daily.   On Humira for psoriasis.  Has been helping with the psoriasis quite a bit, legs more clear.  COPD: Symptoms much improved on the Breo.  Does regularly rinse her mouth after using the Breo.  Still is getting some thrush.  Relevant past medical, surgical, family and social history reviewed. Allergies and medications reviewed and updated. Social History   Tobacco Use  Smoking Status Current Every Day Smoker  . Packs/day: 1.50  . Years: 30.00  . Pack years: 45.00  . Types: Cigarettes  Smokeless Tobacco Never Used   ROS: Per HPI   Objective:    BP 130/84   Pulse 80   Temp 98.4 F (36.9 C) (Oral)   Ht 5\' 2"  (1.575 m)   Wt 222 lb 6.4 oz (100.9 kg)   BMI 40.68 kg/m   Wt Readings from Last 3 Encounters:  09/12/18 222 lb 6.4 oz (100.9 kg)  07/18/18 224 lb 8 oz (101.8 kg)  06/20/18 226 lb 9.6 oz (102.8 kg)    Gen: NAD, alert, cooperative with exam, NCAT EYES: EOMI, no conjunctival injection, or no icterus ENT:  TMs pink b/l, OP without erythema, 56mm white patches over tonsillar pillars, soft palate LYMPH: no cervical LAD CV: NRRR, normal S1/S2, no murmur, distal pulses 2+ b/l Resp: CTABL, no wheezes, normal WOB Ext: No edema, warm Neuro: Alert and oriented MSK: normal muscle bulk Skin: Psoriasis rash on lower legs improved.  Assessment & Plan:  Paulyne was seen today for facial pain, nasal congestion and cough.  Diagnoses and all orders for this visit:  Acute non-recurrent maxillary  sinusitis Discussed symptomatic care, return precautions. -     doxycycline (VIBRA-TABS) 100 MG tablet; Take 1 tablet (100 mg total) by mouth 2 (two) times daily.  Thrush Use below for symptoms -     nystatin (MYCOSTATIN) 100000 UNIT/ML suspension; Take 1 teaspoon, swish, gargle for 1 minute and then spit up 4 times a day until symptoms improve.   Follow up plan: Return if symptoms worsen or fail to improve. [name], MD [place]

## 2018-09-12 NOTE — Patient Instructions (Signed)
Fever reducer and headache: tylenol and ibuprofen, can take together or alternating   Sinus pressure:  Nasal steroid such as flonase/fluticaone or nasocort daily Can also take daily antihistamine such as loratadine/claritin or cetirizine/zyrtec  Sinus rinses/irritation: Netipot or similar with distilled water 2-3 times a day to clear out sinuses or Normal saline nasal spray  Sore throat:  Throat lozenges chloroseptic spray  Stick with bland foods Drink lots of fluids

## 2018-09-16 ENCOUNTER — Encounter: Payer: Self-pay | Admitting: Internal Medicine

## 2018-10-02 ENCOUNTER — Ambulatory Visit (INDEPENDENT_AMBULATORY_CARE_PROVIDER_SITE_OTHER): Payer: Medicare HMO | Admitting: Pediatrics

## 2018-10-02 ENCOUNTER — Other Ambulatory Visit: Payer: Self-pay | Admitting: Pediatrics

## 2018-10-02 ENCOUNTER — Encounter: Payer: Self-pay | Admitting: Pediatrics

## 2018-10-02 VITALS — BP 121/78 | HR 93 | Temp 97.7°F | Ht 62.0 in | Wt 223.0 lb

## 2018-10-02 DIAGNOSIS — G43809 Other migraine, not intractable, without status migrainosus: Secondary | ICD-10-CM

## 2018-10-02 DIAGNOSIS — J309 Allergic rhinitis, unspecified: Secondary | ICD-10-CM | POA: Diagnosis not present

## 2018-10-02 DIAGNOSIS — R059 Cough, unspecified: Secondary | ICD-10-CM

## 2018-10-02 DIAGNOSIS — B370 Candidal stomatitis: Secondary | ICD-10-CM

## 2018-10-02 DIAGNOSIS — R300 Dysuria: Secondary | ICD-10-CM | POA: Diagnosis not present

## 2018-10-02 DIAGNOSIS — R0981 Nasal congestion: Secondary | ICD-10-CM | POA: Diagnosis not present

## 2018-10-02 DIAGNOSIS — L409 Psoriasis, unspecified: Secondary | ICD-10-CM | POA: Diagnosis not present

## 2018-10-02 DIAGNOSIS — B373 Candidiasis of vulva and vagina: Secondary | ICD-10-CM

## 2018-10-02 DIAGNOSIS — N898 Other specified noninflammatory disorders of vagina: Secondary | ICD-10-CM | POA: Diagnosis not present

## 2018-10-02 DIAGNOSIS — R05 Cough: Secondary | ICD-10-CM

## 2018-10-02 DIAGNOSIS — B3731 Acute candidiasis of vulva and vagina: Secondary | ICD-10-CM

## 2018-10-02 LAB — MICROSCOPIC EXAMINATION
BACTERIA UA: NONE SEEN
RBC MICROSCOPIC, UA: NONE SEEN /HPF (ref 0–2)
Renal Epithel, UA: NONE SEEN /hpf

## 2018-10-02 LAB — URINALYSIS, COMPLETE
Bilirubin, UA: NEGATIVE
GLUCOSE, UA: NEGATIVE
Ketones, UA: NEGATIVE
Leukocytes, UA: NEGATIVE
Nitrite, UA: NEGATIVE
PROTEIN UA: NEGATIVE
RBC UA: NEGATIVE
SPEC GRAV UA: 1.01 (ref 1.005–1.030)
UUROB: 0.2 mg/dL (ref 0.2–1.0)
pH, UA: 5.5 (ref 5.0–7.5)

## 2018-10-02 LAB — WET PREP FOR TRICH, YEAST, CLUE
Clue Cell Exam: NEGATIVE
TRICHOMONAS EXAM: NEGATIVE
Yeast Exam: POSITIVE — AB

## 2018-10-02 MED ORDER — VENLAFAXINE HCL 75 MG PO TABS: 75.0000 mg | ORAL_TABLET | Freq: Two times a day (BID) | ORAL | 1 refills | Status: DC

## 2018-10-02 MED ORDER — FLUTICASONE FUROATE-VILANTEROL 100-25 MCG/INH IN AEPB: 1.0000 | INHALATION_SPRAY | Freq: Every day | RESPIRATORY_TRACT | 1 refills | Status: DC

## 2018-10-02 MED ORDER — FLUCONAZOLE 150 MG PO TABS: 150.0000 mg | ORAL_TABLET | ORAL | 0 refills | Status: DC | PRN

## 2018-10-02 NOTE — Progress Notes (Signed)
Subjective:   Patient ID: Judy Evans, female    DOB: 23-Feb-1971, 48 y.o.   MRN: [id number] CC: Medical Management of Chronic Issues  HPI: Judy Evans is a 48 y.o. female   Depressed mood: Some ongoing symptoms.  Feels safe at home.  Taking venlafaxine 37.5 mg twice a day.  She does think it is been helping some with migraines.  Tobacco use: Trying to cut back  Chronic cough: She does think the Memory [facility] is been helping.  Has not been tolerant of other inhalers in the past.  Tends to make the cough worse.  She has had some thrush off and on with the [facility].  Currently taking a high dose.  Sinus congestion: Ongoing, some days worse than others.  Not any worse in the last week.  No fevers.  No facial pain.  Has had some vaginal itching off and on over the last week.  No vaginal discharge.  No change in sexual partners.  Psoriasis: Taking Humira, following with dermatology.  Her toenails started to bother her a lot, causing some pain.  Humira has helped with the rash on lower legs.  Relevant past medical, surgical, family and social history reviewed. Allergies and medications reviewed and updated. Social History   Tobacco Use  Smoking Status Current Every Day Smoker  . Packs/day: 1.50  . Years: 30.00  . Pack years: 45.00  . Types: Cigarettes  Smokeless Tobacco Never Used   ROS: Per HPI   Objective:    BP 121/78   Pulse 93   Temp 97.7 F (36.5 C) (Oral)   Ht 5\' 2"  (1.575 m)   Wt 223 lb (101.2 kg)   BMI 40.79 kg/m   Wt Readings from Last 3 Encounters:  10/02/18 223 lb (101.2 kg)  09/12/18 222 lb 6.4 oz (100.9 kg)  07/18/18 224 lb 8 oz (101.8 kg)    Gen: NAD, alert, cooperative with exam, NCAT EYES: EOMI, no conjunctival injection, or no icterus ENT:  TMs pearly gray b/l, OP without erythema LYMPH: no cervical LAD CV: NRRR, normal S1/S2, no murmur, distal pulses 2+ b/l Resp: CTABL, no wheezes, normal WOB Abd: +BS, soft, NTND.  Ext: No edema, warm Neuro: Alert  and oriented, strength equal b/l UE and LE, coordination grossly normal MSK: normal muscle bulk  Assessment & Plan:  Judy Evans was seen today for medical management of chronic issues.  Diagnoses and all orders for this visit:  Psoriasis Toenails thickened, causing pain.  Will refer to podiatry. -     Ambulatory referral to Podiatry  Other migraine without status migrainosus, not intractable Depression Some ongoing symptoms, will increase in the vaccine to 75 mg. -     venlafaxine (EFFEXOR) 75 MG tablet; Take 1 tablet (75 mg total) by mouth 2 (two) times daily with a meal.  Thrush Continue oral solution nystatin as needed  Cough Decrease to low-dose Breo, will see if continues to help with cough but with less thrush symptoms. -     fluticasone furoate-vilanterol (BREO ELLIPTA) 100-25 MCG/INH AEPB; Inhale 1 puff into the lungs daily.  Allergic rhinitis, unspecified seasonality, unspecified trigger  Sinus congestion Symptomatic care discussed.  Continue to encourage smoking cessation  Vaginal itching -     WET PREP FOR TRICH, YEAST, CLUE  Dysuria -     Urinalysis, Complete -     Urine Culture  Other orders -     Microscopic Examination   Follow up plan: 3-6 months, sooner if needed Arbie Cookey  Evette Doffing, Violet

## 2018-10-02 NOTE — Patient Instructions (Signed)
[facility] Contact: [phone]  

## 2018-10-03 LAB — URINE CULTURE

## 2018-10-06 ENCOUNTER — Encounter: Payer: Self-pay | Admitting: Pediatrics

## 2018-10-07 ENCOUNTER — Ambulatory Visit: Payer: Medicare HMO | Admitting: Podiatry

## 2018-10-22 ENCOUNTER — Other Ambulatory Visit: Payer: Self-pay | Admitting: Neurology

## 2018-11-04 ENCOUNTER — Ambulatory Visit: Payer: Medicare HMO | Admitting: Podiatry

## 2018-11-15 ENCOUNTER — Encounter: Payer: Self-pay | Admitting: *Deleted

## 2018-11-19 ENCOUNTER — Encounter: Payer: Self-pay | Admitting: Neurology

## 2018-11-19 ENCOUNTER — Ambulatory Visit (INDEPENDENT_AMBULATORY_CARE_PROVIDER_SITE_OTHER): Payer: Medicare HMO | Admitting: Neurology

## 2018-11-19 VITALS — BP 118/78 | HR 90 | Ht 62.0 in | Wt 221.5 lb

## 2018-11-19 DIAGNOSIS — M542 Cervicalgia: Secondary | ICD-10-CM | POA: Diagnosis not present

## 2018-11-19 DIAGNOSIS — G43709 Chronic migraine without aura, not intractable, without status migrainosus: Secondary | ICD-10-CM

## 2018-11-19 DIAGNOSIS — M5481 Occipital neuralgia: Secondary | ICD-10-CM | POA: Diagnosis not present

## 2018-11-19 DIAGNOSIS — G4700 Insomnia, unspecified: Secondary | ICD-10-CM | POA: Diagnosis not present

## 2018-11-19 DIAGNOSIS — IMO0002 Reserved for concepts with insufficient information to code with codable children: Secondary | ICD-10-CM

## 2018-11-19 NOTE — Progress Notes (Signed)
[facility]  PATIENT: Judy Evans DOB: 06-14-1971  REFERRING DOCTOR OR PCP:  Dr. Evette Doffing SOURCE: Patient, notes from Dr. Evette Doffing imaging reports, MRI images on PACS.  _________________________________   HISTORICAL  CHIEF COMPLAINT:  Chief Complaint  Patient presents with  . Follow-up    RM 13, alone. Last seen 07/18/18. Started having headaches again within the last 2-3 weeks. Having increased pressure in her neck. She has nausea with this.     HISTORY OF PRESENT ILLNESS:  Judy Evans is a 48 y.o. woman with headaches, shoulder pain and neck pain.  Update 11/19/2018: After the last visit when we do a splenius capitis/occipital nerve block headaches improved.  Over the last 2 to 3 weeks the headaches have returned.  She has a pressure-like sensation in the head and neck.    Pain starts in the neck and radiates up.   Her neck feels stiff.   Pain is worse with noises and bright lights and best with rest.    Additionally she is having nausea.  Tylenol slightly takes the edge off for a few hours.   She is on amitriptyline.      Update 07/18/2018: She reports a lot of headache in the back of the head on the left, rarely on both sides.   Pain is aching in quality.   She has nausea and some vomiting   Bending over and noise and movements worsens the pain.    Nothing makes it better.    She denies specific arm weakness but notes general arm > leg weakness.   Bladder is doing well.  Gait is fine.    She sees Dr. Francesco Runner for Pain management and had a recent cervical ESI --- this helped pain a short time.   She also notes some muscle aches and has been diagnosed with FMS.   She has some sleep maintenance insomnia.     NCV/EMG 04/23/18 Impression: This NCV/EMG study shows the following: 1.   Mild chronic left C7 radiculopathy without active features 2.   No evidence of median or ulnar neuropathies.    Her maternal grandfather and maternal great grandfather had  cerebral aneurysm.     Update 03/18/2018: She feels her migraines are helping a lot since the amitriptyline was titrated up.     She does not get the severe ones anymore and most of the headaches are short and mild.    At a prior visit, we did a shoulder injection and the pain was much better until a few months ago.  Pain is worse with driving and with elevating the arm.    She will note some numbness in her hand at times.     She also has left hip and buttock pain.   A trochanteric bursa injection helped her a lot in the past.      She has numbness in her arms, left > right.  This fluctuates some.   MRI shows C3C4 > C5C6 DJD with foraminal narrowing to the left.       From 03/13/2017:  Her more constant headache and migraines are better on amitriptyline but she continues to get the sharp pain at times that is felt mostly in the right temple.    She tolerates the amitriptyline well.   HA History:   She has had headaches off and on x many years but they worsened 4-5 years ago. She had three especially bad headaches associated with difficulty speaking.   She saw  a neurologist at [facility].   She was placed on amitriptyline with some benefit.   However, when she moved, her new PCP would not right amitriptyline for her.   She was referred to a pain management practice.    When they occur, she often gets sharp pains on the right.   Pain is stabbing.   She gets nausea but rarely has vomiting.   She has had no furhter migraine with aphasia.  Moving increases her pain.   Bright lights and noises increases her pain.  Amitriptyline has helped the headaches Triptans help sometimes but not always.   Tylenol arthritis helps a little bit.   .  FMS/neck pain/LBP:  She still notes the fibromyalgia pain, mostly in the back, neck and hips.     She also has left hip bursitis and at times it seems like the leg is going to give out.     She is on Cymbalta and gabapentin (800 mg po qid) with some benefit.   She is also on  Dilaudid 2 mg qid with benfit.  She has had lumbar spinal injections with benefit.    She sees Dr. Francesco Runner for Pain Management [facility]) for pain management.        Shoulder pain:   The left shoulder pain did better withteh subacromial bursa injection but benefit was only about 2 weeks. Pain increases with elevation and external rotation.    I personally reviewed an MRI of the cervical spine dated 07/14/2016. At C3-C4, she has a disc osteophyte complex causing moderate left foraminal narrowing with some encroachment upon the left C4 nerve root. At C5-C6 she has mild left foraminal narrowing due to a small disc osteophyte complex. There does not appear to be any nerve root compression.  REVIEW OF SYSTEMS: Constitutional: No fevers, chills, sweats, or change in appetite Eyes: No visual changes, double vision, eye pain Ear, nose and throat: No hearing loss, ear pain, nasal congestion, sore throat Cardiovascular: No chest pain, palpitations Respiratory: No shortness of breath at rest or with exertion.   No wheezes GastrointestinaI: has Irritable bowel Genitourinary: No dysuria, urinary retention or frequency.  No nocturia. Musculoskeletal: as above, fibromyalgia/neck pain/ shoulder pain/back pain Integumentary: Has psoriasis Neurological: as above Psychiatric: No depression at this time.  No anxiety Endocrine: has pre-diabetesNo palpitations, diaphoresis, change in appetite, change in weigh or increased thirst Hematologic/Lymphatic: No anemia, purpura, petechiae. Allergic/Immunologic: No itchy/runny eyes, nasal congestion, recent allergic reactions, rashes  ALLERGIES: Allergies  Allergen Reactions  . Rosuvastatin Calcium Swelling    Ends up in the hospital  . Statins Swelling    Ends up in the hospital  . Wellbutrin [Bupropion]     suicidal  . Hctz [Hydrochlorothiazide] Nausea And Vomiting and Rash  . Hydrocodone-Acetaminophen Rash  . Oxycodone Rash  . Penicillins Nausea And  Vomiting and Rash    Has patient had a PCN reaction causing immediate rash, facial/tongue/throat swelling, SOB or lightheadedness with hypotension: Yes Has patient had a PCN reaction causing severe rash involving mucus membranes or skin necrosis: No Has patient had a PCN reaction that required hospitalization No Has patient had a PCN reaction occurring within the last 10 years: No If all of the above answers are "NO", then may proceed with Cephalosporin use.     HOME MEDICATIONS:  Current Outpatient Medications:  .  acetaminophen (TYLENOL) 500 MG tablet, Take 1,000 mg by mouth every 6 (six) hours as needed for mild pain., Disp: , Rfl:  .  albuterol (  PROVENTIL HFA;VENTOLIN HFA) 108 (90 Base) MCG/ACT inhaler, INHALE 2 PUFFS INTO THE LUNGS EVERY 4 (FOUR) HOURS AS NEEDED FOR WHEEZING OR SHORTNESS OF BREATH., Disp: 18 Inhaler, Rfl: 4 .  amitriptyline (ELAVIL) 25 MG tablet, TAKE 1 OR 2 TABLETS BY MOUTH AT BEDTIME, Disp: 180 tablet, Rfl: 3 .  cetirizine (ZYRTEC) 10 MG tablet, TAKE 1 TABLET (10 MG TOTAL) BY MOUTH DAILY., Disp: 30 tablet, Rfl: 8 .  fluconazole (DIFLUCAN) 150 MG tablet, Take 1 tablet (150 mg total) by mouth every three (3) days as needed., Disp: 3 tablet, Rfl: 0 .  fluticasone (FLONASE) 50 MCG/ACT nasal spray, Place 2 sprays into both nostrils daily., Disp: 16 g, Rfl: 6 .  fluticasone furoate-vilanterol (BREO ELLIPTA) 100-25 MCG/INH AEPB, Inhale 1 puff into the lungs daily., Disp: 90 each, Rfl: 1 .  gabapentin (NEURONTIN) 800 MG tablet, Take 1 tablet (800 mg total) by mouth 4 (four) times daily., Disp: 360 tablet, Rfl: 1 .  HUMIRA PEN-PS/UV/ADOL HS START 80 MG/0.8ML & 40MG /0.4ML PNKT, , Disp: , Rfl:  .  ibuprofen (ADVIL,MOTRIN) 600 MG tablet, Take 1 tablet (600 mg total) by mouth every 8 (eight) hours as needed., Disp: 15 tablet, Rfl: 0 .  lidocaine (LIDODERM) 5 %, Place 1 patch onto the skin daily. Remove & Discard patch within 12 hours or as directed by MD, Disp: 30 patch, Rfl: 2 .   lubiprostone (AMITIZA) 24 MCG capsule, TAKE 1 CAPSULE (24 MCG TOTAL) BY MOUTH 2 (TWO) TIMES DAILY WITH A MEAL., Disp: 180 capsule, Rfl: 1 .  nystatin (MYCOSTATIN) 100000 UNIT/ML suspension, Take 1 teaspoon, swish, gargle for 1 minute and then spit up 4 times a day until symptoms improve., Disp: 60 mL, Rfl: 1 .  nystatin ointment (MYCOSTATIN), APPLY 1 APPLICATION TOPICALLY 2 (TWO) TIMES DAILY AS NEEDED., Disp: 30 g, Rfl: 0 .  omeprazole (PRILOSEC) 40 MG capsule, Take 1 capsule (40 mg total) by mouth daily., Disp: 90 capsule, Rfl: 1 .  SUMAtriptan (IMITREX) 100 MG tablet, Take 1 tablet (100 mg total) by mouth once as needed for up to 1 dose for migraine. May repeat in 2 hours if headache persists or recurs., Disp: 10 tablet, Rfl: 2 .  traMADol (ULTRAM) 50 MG tablet, TAKE ONE TABLET (50 MG DOSE) BY MOUTH EVERY 8 (EIGHT) HOURS AS NEEDED FOR PAIN., Disp: , Rfl: 1 .  triamcinolone lotion (KENALOG) 0.1 %, Apply 1 application topically 2 (two) times daily., Disp: , Rfl:  .  venlafaxine (EFFEXOR) 75 MG tablet, Take 1 tablet (75 mg total) by mouth 2 (two) times daily with a meal., Disp: 180 tablet, Rfl: 1  PAST MEDICAL HISTORY: Past Medical History:  Diagnosis Date  . Anxiety   . Aphasia   . Arthritis   . COPD (chronic obstructive pulmonary disease) ([place])   . DDD (degenerative disc disease), cervical   . DDD (degenerative disc disease), lumbar   . Degenerative disc disease at L5-S1 level   . Diabetes mellitus without complication ([place])    States she does not have  . Fibromyalgia   . GERD (gastroesophageal reflux disease)   . H. pylori infection   . Hyperlipidemia    States she does not have  . Hypertension    States she does not have  . IBS (irritable bowel syndrome)   . LLQ pain 06/18/2015  . Migraines   . Moody 01/22/2015  . Pain with urination 02/19/2015  . Psoriasis   . Scoliosis   . Spondylolysis  PAST SURGICAL HISTORY: Past Surgical History:  Procedure Laterality Date  .  ABDOMINAL HYSTERECTOMY    . BIOPSY  09/14/2016   Procedure: BIOPSY;  Surgeon: Daneil Dolin, MD;  Location: [facility];  Service: Endoscopy;;  gastric   . CARDIAC CATHETERIZATION  2015   patient reported it to be normal. "I was dying in pain during procedure".   . CARPAL TUNNEL RELEASE Right   . CESAREAN SECTION    . CHOLECYSTECTOMY    . COLONOSCOPY  06/2014   Altamese Dilling DeMason: normal  . DILATION AND CURETTAGE OF UTERUS    . ESOPHAGOGASTRODUODENOSCOPY  07/2014   Burke Keels: Normal  . ESOPHAGOGASTRODUODENOSCOPY (EGD) WITH PROPOFOL N/A 09/14/2016   Dr. Gala Romney: Normal esophagus status post empiric dilation, erosive gastropathy with biopsies showing chronic inactive gastritis, no H pylori.  Marland Kitchen FOOT SURGERY Bilateral    removal of heel spurs  . MALONEY DILATION  09/14/2016   Procedure: Venia Minks DILATION;  Surgeon: Daneil Dolin, MD;  Location: [facility];  Service: Endoscopy;;  . rotator cuff surgery Right   . TONSILLECTOMY      FAMILY HISTORY: Family History  Problem Relation Age of Onset  . COPD Mother   . Diabetes Mother   . Osteoarthritis Mother   . Atrial fibrillation Mother   . Heart disease Mother   . Healthy Father   . Lupus Sister   . CAD Sister   . Diabetes Sister   . Early death Brother        pneumonia  . Asthma Daughter   . Asthma Son   . Cancer Maternal Grandmother 63       colon  . Arthritis Maternal Grandmother   . Diabetes Maternal Grandmother   . Dementia Maternal Grandmother   . Other Maternal Grandfather        brain tumor  . Cancer Paternal Grandmother        breast  . Cancer Paternal Grandfather        lung  . Other Paternal Grandfather        brain aneurysm  . Factor V Leiden deficiency Sister   . Arthritis Sister   . Psoriasis Sister     SOCIAL HISTORY:  Social History   Socioeconomic History  . Marital status: Legally Separated    Spouse name: Not on file  . Number of children: 2  . Years of education: GED  . Highest education  level: GED or equivalent  Occupational History  . Occupation: Disabled  Social Needs  . Financial resource strain: Somewhat hard  . Food insecurity:    Worry: Sometimes true    Inability: Sometimes true  . Transportation needs:    Medical: No    Non-medical: No  Tobacco Use  . Smoking status: Current Every Day Smoker    Packs/day: 1.50    Years: 30.00    Pack years: 45.00    Types: Cigarettes  . Smokeless tobacco: Never Used  Substance and Sexual Activity  . Alcohol use: No  . Drug use: No  . Sexual activity: Not Currently    Birth control/protection: Surgical    Comment: hyst  Lifestyle  . Physical activity:    Days per week: 0 days    Minutes per session: 0 min  . Stress: Not at all  Relationships  . Social connections:    Talks on phone: More than three times a week    Gets together: More than three times a week  Attends religious service: Never    Active member of club or organization: No    Attends meetings of clubs or organizations: Never    Relationship status: Separated  . Intimate partner violence:    Fear of current or ex partner: No    Emotionally abused: No    Physically abused: No    Forced sexual activity: No  Other Topics Concern  . Not on file  Social History Narrative   Caffeine use: Drinks coffee (2 cups per day), tea/soda- 2-3 cups per day   Right handed   Lives alone     PHYSICAL EXAM  Vitals:   11/19/18 1527  BP: 118/78  Pulse: 90  SpO2: 98%  Weight: 221 lb 8 oz (100.5 kg)  Height: 5\' 2"  (1.575 m)    Body mass index is 40.51 kg/m.   General: The patient is well-developed and well-nourished and in no acute distress   Musculoskeletal: She has severe tenderness at the splenius capitis/occipital nerve on the left and moderate tenderness on the right.  Range of motion is mildly reduced in the neck.    She has tenderness over many of the classic fibromyalgia tender points and the left trochanteric bursa.  .   Neurologic  Exam  Mental status: The patient is alert and oriented x 3 at the time of the examination. The patient has apparent normal recent and remote memory, with an apparently normal attention span and concentration ability.   Speech is normal.  Cranial nerves: Extraocular muscles are intact.  Facial strength and sensation is normal.  Trapezius strength is normal.  The tongue is midline, and the patient has symmetric elevation of the soft palate. No obvious hearing deficits are noted.  Motor:  Muscle bulk is normal.   Muscle tone is normal. Strength is 5/5 in the arms or legs..   Sensory: Sensory testing is intact to pinprick, soft touch and vibration sensation in all 4 extremities.  Coordination: Cerebellar testing reveals good finger-nose-finger bilaterally.  Gait and station: Station is normal.   The gait is normal.  Tandem gait is slightly wide.  Romberg is negative.  Reflexes: Deep tendon reflexes are symmetric and normal bilaterally.        DIAGNOSTIC DATA (LABS, IMAGING, TESTING) - I reviewed patient records, labs, notes, testing and imaging myself where available.  Lab Results  Component Value Date   WBC 9.6 03/22/2018   HGB 16.3 (H) 03/22/2018   HCT 48.3 (H) 03/22/2018   MCV 91 03/22/2018   PLT 277 03/22/2018      Component Value Date/Time   NA 143 03/22/2018 1316   K 3.8 03/22/2018 1316   CL 104 03/22/2018 1316   CO2 24 03/22/2018 1316   GLUCOSE 116 (H) 03/22/2018 1316   GLUCOSE 98 07/07/2016 1525   BUN 10 03/22/2018 1316   CREATININE 0.86 03/22/2018 1316   CREATININE 0.76 07/07/2016 1525   CALCIUM 9.3 03/22/2018 1316   PROT 6.8 03/22/2018 1316   ALBUMIN 4.5 03/22/2018 1316   AST 13 03/22/2018 1316   ALT 12 03/22/2018 1316   ALKPHOS 102 03/22/2018 1316   BILITOT 0.4 03/22/2018 1316   GFRNONAA 81 03/22/2018 1316   GFRAA 94 03/22/2018 1316   Lab Results  Component Value Date   CHOL 199 01/22/2017   HDL 46 01/22/2017   LDLCALC 116 (H) 01/22/2017   TRIG 187 (H)  01/22/2017   CHOLHDL 4.3 01/22/2017   Lab Results  Component Value Date   HGBA1C 5.5 03/22/2018  Lab Results  Component Value Date   [id number] 694 09/15/2016   Lab Results  Component Value Date   TSH 1.230 03/22/2018       ASSESSMENT AND PLAN  Chronic migraine  Neck pain  Bilateral occipital neuralgia  Insomnia, unspecified type   1.   Bilateral splenius capitis trigger point injection with 80 mg Depo-Medrol in 3 cc Lidocaine using sterile technique.   She tolerated the procedures well with no new complications.   She noted pain was better afterwards. 2.   She will continue gabapentin and amitriptyline.  I will add nighttime tizanidine to help with her insomnia and pain at night. 3.    Stay active and exercise as tolerated.   4.   Hopefully insomnia will improve when the headaches are doing better.  She will call if not better.   5.   Return to see me in 6 months and call sooner if she has significant new or worsening symptoms or based on the results of the MRI.   Raneen Jaffer A. Felecia Shelling, MD, PhD 8/54/6270, 3:50 PM Certified in Neurology, Clinical Neurophysiology, Sleep Medicine, Pain Medicine and Neuroimaging  [facility] [address] [place], [place] [address] [phone]

## 2018-11-20 ENCOUNTER — Other Ambulatory Visit: Payer: Self-pay | Admitting: Neurology

## 2018-11-25 ENCOUNTER — Encounter: Payer: Self-pay | Admitting: Podiatry

## 2018-11-25 ENCOUNTER — Ambulatory Visit (INDEPENDENT_AMBULATORY_CARE_PROVIDER_SITE_OTHER): Payer: Medicare HMO | Admitting: Podiatry

## 2018-11-25 VITALS — BP 121/67 | HR 78 | Resp 16

## 2018-11-25 DIAGNOSIS — M79674 Pain in right toe(s): Secondary | ICD-10-CM

## 2018-11-25 DIAGNOSIS — B351 Tinea unguium: Secondary | ICD-10-CM | POA: Diagnosis not present

## 2018-11-25 DIAGNOSIS — L600 Ingrowing nail: Secondary | ICD-10-CM

## 2018-11-25 DIAGNOSIS — M79675 Pain in left toe(s): Secondary | ICD-10-CM

## 2018-11-25 NOTE — Patient Instructions (Signed)

## 2018-11-25 NOTE — Progress Notes (Signed)
   Subjective:    Patient ID: Judy Evans, female    DOB: Feb 25, 1971, 48 y.o.   MRN: [id number]  HPI    Review of Systems  All other systems reviewed and are negative.      Objective:   Physical Exam        Assessment & Plan:

## 2018-11-28 NOTE — Progress Notes (Signed)
Subjective:   Patient ID: Judy Evans, female   DOB: 48 y.o.   MRN: [id number]   HPI Patient presents with thick nailbeds 1-5 both feet that she cannot cut with long-term history of ingrown toenails.  Patient does smoke and is not active currently    Review of Systems  All other systems reviewed and are negative.       Objective:  Physical Exam Vitals signs and nursing note reviewed.  Constitutional:      Appearance: She is well-developed.  Pulmonary:     Effort: Pulmonary effort is normal.  Musculoskeletal: Normal range of motion.  Skin:    General: Skin is warm.  Neurological:     Mental Status: She is alert.     Neurovascular status was found to be intact muscle strength was adequate range of motion was within normal limits.  Patient is found to have thick brittle nailbeds 1-5 both feet that are moderately painful and is found to have incurvation of the nail beds bilateral     Assessment:  Chronic ingrown toenail deformity with thick yellow brittle nailbeds that are painful 1-5 both feet     Plan:  Reviewed condition and at this point aggressive debridement accomplished with consideration of ingrown toenail or nail removal in future which I educated her on today explaining in great detail what could be done to try to help her condition

## 2018-12-25 ENCOUNTER — Other Ambulatory Visit: Payer: Self-pay | Admitting: Pediatrics

## 2018-12-25 DIAGNOSIS — M79662 Pain in left lower leg: Secondary | ICD-10-CM

## 2019-01-01 ENCOUNTER — Other Ambulatory Visit: Payer: Self-pay | Admitting: Pediatrics

## 2019-01-01 DIAGNOSIS — J449 Chronic obstructive pulmonary disease, unspecified: Secondary | ICD-10-CM

## 2019-01-01 DIAGNOSIS — J309 Allergic rhinitis, unspecified: Secondary | ICD-10-CM

## 2019-01-06 ENCOUNTER — Encounter: Payer: Medicare HMO | Admitting: *Deleted

## 2019-01-06 ENCOUNTER — Ambulatory Visit: Payer: Medicare HMO | Admitting: Family Medicine

## 2019-01-07 ENCOUNTER — Ambulatory Visit (INDEPENDENT_AMBULATORY_CARE_PROVIDER_SITE_OTHER): Payer: Medicare HMO | Admitting: Family Medicine

## 2019-01-07 ENCOUNTER — Encounter: Payer: Self-pay | Admitting: Family Medicine

## 2019-01-07 ENCOUNTER — Other Ambulatory Visit: Payer: Self-pay

## 2019-01-07 DIAGNOSIS — J0110 Acute frontal sinusitis, unspecified: Secondary | ICD-10-CM | POA: Diagnosis not present

## 2019-01-07 MED ORDER — DOXYCYCLINE HYCLATE 100 MG PO TABS: 100.0000 mg | ORAL_TABLET | Freq: Two times a day (BID) | ORAL | 0 refills | Status: AC

## 2019-01-07 NOTE — Progress Notes (Signed)
Virtual Visit via telephone Note Due to COVID-19, visit is conducted virtually and was requested by patient.  I connected with Judy Evans on 01/07/19 at 1305 by telephone and verified that I am speaking with the correct person using two identifiers. Judy Evans is currently located at home and family is currently with them during visit. The provider, Monia Pouch, FNP is located in their office at time of visit.  I discussed the limitations, risks, security and privacy concerns of performing an evaluation and management service by telephone and the availability of in person appointments. I also discussed with the patient that there may be a patient responsible charge related to this service. The patient expressed understanding and agreed to proceed.  Subjective:  Patient ID: Judy Evans, female    DOB: June 21, 1971, 48 y.o.   MRN: [id number]  Chief Complaint:  Sinus Problem   HPI: Judy Evans is a 48 y.o. female presenting on 01/07/2019 for Sinus Problem   Pt reports she is doing fairly well overall. States she has developed frontal sinus pressure and pain, nasal congestion, sore throat, and rhinorrhea. States this started several days ago and is getting worse. States the pain is worse when supine or bending over. States she has been using her Flonase without relief of symptoms. Pt states she has not checked her temperature. States she does have chills and fatigue with the symptoms.    Relevant past medical, surgical, family, and social history reviewed and updated as indicated.  Allergies and medications reviewed and updated.   Past Medical History:  Diagnosis Date  . Anxiety   . Aphasia   . Arthritis   . COPD (chronic obstructive pulmonary disease) ([place])   . DDD (degenerative disc disease), cervical   . DDD (degenerative disc disease), lumbar   . Degenerative disc disease at L5-S1 level   . Diabetes mellitus without complication ([place])    States she  does not have  . Fibromyalgia   . GERD (gastroesophageal reflux disease)   . H. pylori infection   . Hyperlipidemia    States she does not have  . Hypertension    States she does not have  . IBS (irritable bowel syndrome)   . LLQ pain 06/18/2015  . Migraines   . Moody 01/22/2015  . Pain with urination 02/19/2015  . Psoriasis   . Scoliosis   . Spondylolysis     Past Surgical History:  Procedure Laterality Date  . ABDOMINAL HYSTERECTOMY    . BIOPSY  09/14/2016   Procedure: BIOPSY;  Surgeon: Daneil Dolin, MD;  Location: [facility];  Service: Endoscopy;;  gastric   . CARDIAC CATHETERIZATION  2015   patient reported it to be normal. "I was dying in pain during procedure".   . CARPAL TUNNEL RELEASE Right   . CESAREAN SECTION    . CHOLECYSTECTOMY    . COLONOSCOPY  06/2014   Altamese Dilling DeMason: normal  . DILATION AND CURETTAGE OF UTERUS    . ESOPHAGOGASTRODUODENOSCOPY  07/2014   Burke Keels: Normal  . ESOPHAGOGASTRODUODENOSCOPY (EGD) WITH PROPOFOL N/A 09/14/2016   Dr. Gala Romney: Normal esophagus status post empiric dilation, erosive gastropathy with biopsies showing chronic inactive gastritis, no H pylori.  Marland Kitchen FOOT SURGERY Bilateral    removal of heel spurs  . MALONEY DILATION  09/14/2016   Procedure: Venia Minks DILATION;  Surgeon: Daneil Dolin, MD;  Location: [facility];  Service: Endoscopy;;  . rotator cuff surgery Right   . TONSILLECTOMY  Social History   Socioeconomic History  . Marital status: Legally Separated    Spouse name: Not on file  . Number of children: 2  . Years of education: GED  . Highest education level: GED or equivalent  Occupational History  . Occupation: Disabled  Social Needs  . Financial resource strain: Somewhat hard  . Food insecurity:    Worry: Sometimes true    Inability: Sometimes true  . Transportation needs:    Medical: No    Non-medical: No  Tobacco Use  . Smoking status: Current Every Day Smoker    Packs/day: 1.50    Years: 30.00     Pack years: 45.00    Types: Cigarettes  . Smokeless tobacco: Never Used  Substance and Sexual Activity  . Alcohol use: No  . Drug use: No  . Sexual activity: Not Currently    Birth control/protection: Surgical    Comment: hyst  Lifestyle  . Physical activity:    Days per week: 0 days    Minutes per session: 0 min  . Stress: Not at all  Relationships  . Social connections:    Talks on phone: More than three times a week    Gets together: More than three times a week    Attends religious service: Never    Active member of club or organization: No    Attends meetings of clubs or organizations: Never    Relationship status: Separated  . Intimate partner violence:    Fear of current or ex partner: No    Emotionally abused: No    Physically abused: No    Forced sexual activity: No  Other Topics Concern  . Not on file  Social History Narrative   Caffeine use: Drinks coffee (2 cups per day), tea/soda- 2-3 cups per day   Right handed   Lives alone    Outpatient Encounter Medications as of 01/07/2019  Medication Sig  . acetaminophen (TYLENOL) 500 MG tablet Take 1,000 mg by mouth every 6 (six) hours as needed for mild pain.  Marland Kitchen albuterol (VENTOLIN HFA) 108 (90 Base) MCG/ACT inhaler INHALE 2 PUFFS INTO THE LUNGS EVERY 4 (FOUR) HOURS AS NEEDED FOR WHEEZING OR SHORTNESS OF BREATH.  Marland Kitchen amitriptyline (ELAVIL) 25 MG tablet TAKE 1 OR 2 TABLETS BY MOUTH AT BEDTIME  . cetirizine (ZYRTEC) 10 MG tablet TAKE 1 TABLET (10 MG TOTAL) BY MOUTH DAILY.  Marland Kitchen doxycycline (VIBRA-TABS) 100 MG tablet Take 1 tablet (100 mg total) by mouth 2 (two) times daily for 7 days. 1 po bid  . fluconazole (DIFLUCAN) 150 MG tablet Take 1 tablet (150 mg total) by mouth every three (3) days as needed.  . fluticasone (FLONASE) 50 MCG/ACT nasal spray SPRAY 2 SPRAYS INTO EACH NOSTRIL EVERY DAY  . fluticasone furoate-vilanterol (BREO ELLIPTA) 100-25 MCG/INH AEPB Inhale 1 puff into the lungs daily.  Marland Kitchen gabapentin (NEURONTIN)  800 MG tablet TAKE 1 TABLET (800 MG TOTAL) BY MOUTH 4 (FOUR) TIMES DAILY.  Marland Kitchen HUMIRA PEN-PS/UV/ADOL HS START 80 MG/0.8ML & 40MG /0.4ML PNKT   . ibuprofen (ADVIL,MOTRIN) 600 MG tablet Take 1 tablet (600 mg total) by mouth every 8 (eight) hours as needed.  . lidocaine (LIDODERM) 5 % Place 1 patch onto the skin daily. Remove & Discard patch within 12 hours or as directed by MD  . lubiprostone (AMITIZA) 24 MCG capsule TAKE 1 CAPSULE (24 MCG TOTAL) BY MOUTH 2 (TWO) TIMES DAILY WITH A MEAL.  Marland Kitchen nystatin (MYCOSTATIN) 100000 UNIT/ML suspension Take 1 teaspoon, swish,  gargle for 1 minute and then spit up 4 times a day until symptoms improve.  . nystatin ointment (MYCOSTATIN) APPLY 1 APPLICATION TOPICALLY 2 (TWO) TIMES DAILY AS NEEDED.  Marland Kitchen omeprazole (PRILOSEC) 40 MG capsule Take 1 capsule (40 mg total) by mouth daily.  . SUMAtriptan (IMITREX) 100 MG tablet Take 1 tablet (100 mg total) by mouth once as needed for up to 1 dose for migraine. May repeat in 2 hours if headache persists or recurs.  Marland Kitchen tiZANidine (ZANAFLEX) 4 MG tablet TAKE 1 TO 2 TABLETS BY MOUTH NIGHTLY  . traMADol (ULTRAM) 50 MG tablet TAKE ONE TABLET (50 MG DOSE) BY MOUTH EVERY 8 (EIGHT) HOURS AS NEEDED FOR PAIN.  Marland Kitchen triamcinolone lotion (KENALOG) 0.1 % Apply 1 application topically 2 (two) times daily.  Marland Kitchen venlafaxine (EFFEXOR) 75 MG tablet Take 1 tablet (75 mg total) by mouth 2 (two) times daily with a meal.   No facility-administered encounter medications on file as of 01/07/2019.     Allergies  Allergen Reactions  . Rosuvastatin Calcium Swelling    Ends up in the hospital  . Statins Swelling    Ends up in the hospital  . Wellbutrin [Bupropion]     suicidal  . Hctz [Hydrochlorothiazide] Nausea And Vomiting and Rash  . Hydrocodone-Acetaminophen Rash  . Oxycodone Rash  . Penicillins Nausea And Vomiting and Rash    Has patient had a PCN reaction causing immediate rash, facial/tongue/throat swelling, SOB or lightheadedness with hypotension:  Yes Has patient had a PCN reaction causing severe rash involving mucus membranes or skin necrosis: No Has patient had a PCN reaction that required hospitalization No Has patient had a PCN reaction occurring within the last 10 years: No If all of the above answers are "NO", then may proceed with Cephalosporin use.     Review of Systems  Constitutional: Positive for chills and fatigue. Negative for fever and unexpected weight change.  HENT: Positive for congestion, postnasal drip, rhinorrhea, sinus pressure, sinus pain and sore throat.   Respiratory: Positive for cough. Negative for chest tightness and shortness of breath.   Cardiovascular: Negative for chest pain and palpitations.  Gastrointestinal: Positive for abdominal pain (IBS), constipation (IBS) and diarrhea (IBS).  Genitourinary: Negative for decreased urine volume, difficulty urinating, flank pain, vaginal bleeding, vaginal discharge and vaginal pain.  Musculoskeletal: Negative for myalgias.  Neurological: Positive for headaches. Negative for dizziness, syncope, weakness and light-headedness.  Psychiatric/Behavioral: Negative for confusion.  All other systems reviewed and are negative.        Observations/Objective: No vital signs or physical exam, this was a telephone or virtual health encounter.  Pt alert and oriented, answers all questions appropriately, and able to speak in full sentences.    Assessment and Plan: Kaitlinn was seen today for sinus problem.  Diagnoses and all orders for this visit:  Acute non-recurrent frontal sinusitis Symptomatic care discussed. Increase water intake. Continue Flonase. Medications as prescribed. Report any new or worsening symptoms.  -     doxycycline (VIBRA-TABS) 100 MG tablet; Take 1 tablet (100 mg total) by mouth 2 (two) times daily for 7 days. 1 po bid     Follow Up Instructions: Return in about 3 months (around 04/08/2019), or if symptoms worsen or fail to improve, for  chronic medical conditions.    I discussed the assessment and treatment plan with the patient. The patient was provided an opportunity to ask questions and all were answered. The patient agreed with the plan and demonstrated an understanding  of the instructions.   The patient was advised to call back or seek an in-person evaluation if the symptoms worsen or if the condition fails to improve as anticipated.  The above assessment and management plan was discussed with the patient. The patient verbalized understanding of and has agreed to the management plan. Patient is aware to call the clinic if symptoms persist or worsen. Patient is aware when to return to the clinic for a follow-up visit. Patient educated on when it is appropriate to go to the emergency department.    I provided 15 minutes of non-face-to-face time during this encounter. The call started at 1305. The call ended at 1320.   Monia Pouch, FNP-C [facility] [address] [place], [place] [address] [phone]

## 2019-01-21 ENCOUNTER — Other Ambulatory Visit: Payer: Self-pay | Admitting: Pediatrics

## 2019-01-21 ENCOUNTER — Telehealth: Payer: Self-pay | Admitting: Family Medicine

## 2019-01-21 DIAGNOSIS — K219 Gastro-esophageal reflux disease without esophagitis: Secondary | ICD-10-CM

## 2019-01-25 ENCOUNTER — Other Ambulatory Visit: Payer: Self-pay | Admitting: Family Medicine

## 2019-01-25 DIAGNOSIS — J309 Allergic rhinitis, unspecified: Secondary | ICD-10-CM

## 2019-01-29 ENCOUNTER — Other Ambulatory Visit: Payer: Self-pay | Admitting: Family Medicine

## 2019-01-29 DIAGNOSIS — J449 Chronic obstructive pulmonary disease, unspecified: Secondary | ICD-10-CM

## 2019-02-03 ENCOUNTER — Encounter: Payer: Self-pay | Admitting: Podiatry

## 2019-02-03 ENCOUNTER — Other Ambulatory Visit: Payer: Self-pay

## 2019-02-03 ENCOUNTER — Ambulatory Visit (INDEPENDENT_AMBULATORY_CARE_PROVIDER_SITE_OTHER): Payer: Medicare HMO | Admitting: Podiatry

## 2019-02-03 VITALS — Temp 98.6°F

## 2019-02-03 DIAGNOSIS — M79675 Pain in left toe(s): Secondary | ICD-10-CM | POA: Diagnosis not present

## 2019-02-03 DIAGNOSIS — M722 Plantar fascial fibromatosis: Secondary | ICD-10-CM | POA: Diagnosis not present

## 2019-02-03 DIAGNOSIS — B351 Tinea unguium: Secondary | ICD-10-CM | POA: Diagnosis not present

## 2019-02-03 DIAGNOSIS — M79674 Pain in right toe(s): Secondary | ICD-10-CM | POA: Diagnosis not present

## 2019-02-03 NOTE — Patient Instructions (Addendum)
Plantar Fasciitis      Plantar fasciitis is a painful foot condition that affects the heel. It occurs when the band of tissue that connects the toes to the heel bone (plantar fascia) becomes irritated. This can happen as the result of exercising too much or doing other repetitive activities (overuse injury).  The pain from plantar fasciitis can range from mild irritation to severe pain that makes it difficult to walk or move. The pain is usually worse in the morning after sleeping, or after sitting or lying down for a while. Pain may also be worse after long periods of walking or standing.  What are the causes?  This condition may be caused by:  Standing for long periods of time.  Wearing shoes that do not have good arch support.  Doing activities that put stress on joints (high-impact activities), including running, aerobics, and ballet.  Being overweight.  An abnormal way of walking (gait).  Tight muscles in the back of your lower leg (calf).  High arches in your feet.  Starting a new athletic activity.  What are the signs or symptoms?  The main symptom of this condition is heel pain. Pain may:  Be worse with first steps after a time of rest, especially in the morning after sleeping or after you have been sitting or lying down for a while.  Be worse after long periods of standing still.  Decrease after 30-45 minutes of activity, such as gentle walking.  How is this diagnosed?  This condition may be diagnosed based on your medical history and your symptoms. Your health care provider may ask questions about your activity level. Your health care provider will do a physical exam to check for:  A tender area on the bottom of your foot.  A high arch in your foot.  Pain when you move your foot.  Difficulty moving your foot.  You may have imaging tests to confirm the diagnosis, such as:  X-rays.  Ultrasound.  MRI.  How is this treated?  Treatment for plantar fasciitis depends on how severe your condition is.  Treatment may include:  Rest, ice, applying pressure (compression), and raising the affected foot (elevation). This may be called RICE therapy. Your health care provider may recommend RICE therapy along with over-the-counter pain medicines to manage your pain.  Exercises to stretch your calves and your plantar fascia.  A splint that holds your foot in a stretched, upward position while you sleep (night splint).  Physical therapy to relieve symptoms and prevent problems in the future.  Injections of steroid medicine (cortisone) to relieve pain and inflammation.  Stimulating your plantar fascia with electrical impulses (extracorporeal shock wave therapy). This is usually the last treatment option before surgery.  Surgery, if other treatments have not worked after 12 months.  Follow these instructions at home:      Managing pain, stiffness, and swelling  If directed, put ice on the painful area:  Put ice in a plastic bag, or use a frozen bottle of water.  Place a towel between your skin and the bag or bottle.  Roll the bottom of your foot over the bag or bottle.  Do this for 20 minutes, 2-3 times a day.  Wear athletic shoes that have air-sole or gel-sole cushions, or try wearing soft shoe inserts that are designed for plantar fasciitis.  Raise (elevate) your foot above the level of your heart while you are sitting or lying down.  Activity  Avoid activities that cause pain.   Ask your health care provider what activities are safe for you.  Do physical therapy exercises and stretches as told by your health care provider.  Try activities and forms of exercise that are easier on your joints (low-impact). Examples include swimming, water aerobics, and biking.  General instructions  Take over-the-counter and prescription medicines only as told by your health care provider.  Wear a night splint while sleeping, if told by your health care provider. Loosen the splint if your toes tingle, become numb, or turn cold and  blue.  Maintain a healthy weight, or work with your health care provider to lose weight as needed.  Keep all follow-up visits as told by your health care provider. This is important.  Contact a health care provider if you:  Have symptoms that do not go away after caring for yourself at home.  Have pain that gets worse.  Have pain that affects your ability to move or do your daily activities.  Summary  Plantar fasciitis is a painful foot condition that affects the heel. It occurs when the band of tissue that connects the toes to the heel bone (plantar fascia) becomes irritated.  The main symptom of this condition is heel pain that may be worse after exercising too much or standing still for a long time.  Treatment varies, but it usually starts with rest, ice, compression, and elevation (RICE therapy) and over-the-counter medicines to manage pain.  This information is not intended to replace advice given to you by your health care provider. Make sure you discuss any questions you have with your health care provider.  Document Released: 06/06/2001 Document Revised: 07/09/2017 Document Reviewed: 07/09/2017  Elsevier Interactive Patient Education © 2019 [employer]

## 2019-02-05 ENCOUNTER — Other Ambulatory Visit: Payer: Self-pay | Admitting: *Deleted

## 2019-02-09 NOTE — Progress Notes (Signed)
Subjective: Judy Evans presents today with painful, thick toenails 1-5 b/l that she cannot cut and which interfere with daily activities.  Pain is aggravated when wearing enclosed shoe gear.  Baruch Gouty, FNP is her PCP.   Pt states she is also having discomfort b/l heels. She has prior surgical history of bilateral plantar heel spurs/fasciotomies about 10 years ago.    Current Outpatient Medications:  .  acetaminophen (TYLENOL) 500 MG tablet, Take 1,000 mg by mouth every 6 (six) hours as needed for mild pain., Disp: , Rfl:  .  albuterol (VENTOLIN HFA) 108 (90 Base) MCG/ACT inhaler, INHALE 2 PUFFS INTO THE LUNGS EVERY 4 (FOUR) HOURS AS NEEDED FOR WHEEZING OR SHORTNESS OF BREATH., Disp: 18 Inhaler, Rfl: 0 .  amitriptyline (ELAVIL) 25 MG tablet, TAKE 1 OR 2 TABLETS BY MOUTH AT BEDTIME, Disp: 180 tablet, Rfl: 3 .  cetirizine (ZYRTEC) 10 MG tablet, TAKE 1 TABLET (10 MG TOTAL) BY MOUTH DAILY., Disp: 30 tablet, Rfl: 8 .  fluconazole (DIFLUCAN) 150 MG tablet, Take 1 tablet (150 mg total) by mouth every three (3) days as needed., Disp: 3 tablet, Rfl: 0 .  fluticasone (FLONASE) 50 MCG/ACT nasal spray, SPRAY 2 SPRAYS INTO EACH NOSTRIL EVERY DAY, Disp: 48 g, Rfl: 1 .  fluticasone furoate-vilanterol (BREO ELLIPTA) 100-25 MCG/INH AEPB, Inhale 1 puff into the lungs daily., Disp: 90 each, Rfl: 1 .  gabapentin (NEURONTIN) 800 MG tablet, TAKE 1 TABLET (800 MG TOTAL) BY MOUTH 4 (FOUR) TIMES DAILY., Disp: 360 tablet, Rfl: 0 .  HUMIRA PEN-PS/UV/ADOL HS START 80 MG/0.8ML & 40MG /0.4ML PNKT, , Disp: , Rfl:  .  ibuprofen (ADVIL,MOTRIN) 600 MG tablet, Take 1 tablet (600 mg total) by mouth every 8 (eight) hours as needed., Disp: 15 tablet, Rfl: 0 .  lidocaine (LIDODERM) 5 %, Place 1 patch onto the skin daily. Remove & Discard patch within 12 hours or as directed by MD, Disp: 30 patch, Rfl: 2 .  lubiprostone (AMITIZA) 24 MCG capsule, TAKE 1 CAPSULE (24 MCG TOTAL) BY MOUTH 2 (TWO) TIMES DAILY WITH A MEAL., Disp:  180 capsule, Rfl: 1 .  nystatin (MYCOSTATIN) 100000 UNIT/ML suspension, Take 1 teaspoon, swish, gargle for 1 minute and then spit up 4 times a day until symptoms improve., Disp: 60 mL, Rfl: 1 .  nystatin ointment (MYCOSTATIN), APPLY 1 APPLICATION TOPICALLY 2 (TWO) TIMES DAILY AS NEEDED., Disp: 30 g, Rfl: 0 .  omeprazole (PRILOSEC) 40 MG capsule, TAKE 1 CAPSULE BY MOUTH EVERY DAY, Disp: 90 capsule, Rfl: 1 .  SUMAtriptan (IMITREX) 100 MG tablet, Take 1 tablet (100 mg total) by mouth once as needed for up to 1 dose for migraine. May repeat in 2 hours if headache persists or recurs., Disp: 10 tablet, Rfl: 2 .  tiZANidine (ZANAFLEX) 4 MG tablet, TAKE 1 TO 2 TABLETS BY MOUTH NIGHTLY, Disp: 60 tablet, Rfl: 5 .  triamcinolone lotion (KENALOG) 0.1 %, Apply 1 application topically 2 (two) times daily., Disp: , Rfl:  .  venlafaxine (EFFEXOR) 75 MG tablet, Take 1 tablet (75 mg total) by mouth 2 (two) times daily with a meal., Disp: 180 tablet, Rfl: 1  Allergies  Allergen Reactions  . Rosuvastatin Calcium Swelling    Ends up in the hospital  . Statins Swelling    Ends up in the hospital  . Wellbutrin [Bupropion]     suicidal  . Hctz [Hydrochlorothiazide] Nausea And Vomiting and Rash  . Hydrocodone-Acetaminophen Rash  . Oxycodone Rash  . Penicillins Nausea And  Vomiting and Rash    Has patient had a PCN reaction causing immediate rash, facial/tongue/throat swelling, SOB or lightheadedness with hypotension: Yes Has patient had a PCN reaction causing severe rash involving mucus membranes or skin necrosis: No Has patient had a PCN reaction that required hospitalization No Has patient had a PCN reaction occurring within the last 10 years: No If all of the above answers are "NO", then may proceed with Cephalosporin use.     Objective: Vitals:   02/03/19 1536  Temp: 98.6 F (37 C)   Vascular Examination: Capillary refill time immediate x 10 digits.  Dorsalis pedis and Posterior tibial pulses  palpable b/l.  Digital hair sparse x 10 digits.  Skin temperature gradient WNL b/l.  Dermatological Examination: Skin with normal turgor, texture and tone b/l.  Toenails 1-5 b/l discolored, thick, dystrophic with subungual debris and pain with palpation to nailbeds due to thickness of nails.  Musculoskeletal: Muscle strength 5/5 to all LE muscle groups  Tenderness to plantarmedial calcaneal tubercle left greater than right.  No pain, crepitus or joint limitation noted with ROM.   Neurological: Sensation intact with 10 gram monofilament.  Vibratory sensation intact.  Assessment: Painful onychomycosis toenails 1-5 b/l  Plantar fasciitis left foot >right  Plan: 1. Toenails 1-5 b/l were debrided in length and girth without iatrogenic bleeding. 2. Discussed options for plantar fasciitis discussed included stretching exercises, night splint, plantar fascial strap, oral anti-inflammatories, icing, protection, local steroid injection, and arch supports/custom orthotics. Patient opted for plantar fascial strapping left foot. Dispensed plantar fascial strap for left heel. Consider injection if symptoms do not resolve. 3. Written instructions for stretching exercises dispensed.  4. Patient to continue soft, supportive shoe gear daily.Recommended New Balance sneakers, series 600 or higher. 5. Patient to report any pedal injuries to medical professional immediately. 6. Follow up 3 months.  7. Patient/POA to call should there be a concern in the interim.

## 2019-02-18 DIAGNOSIS — D2262 Melanocytic nevi of left upper limb, including shoulder: Secondary | ICD-10-CM | POA: Diagnosis not present

## 2019-02-18 DIAGNOSIS — D485 Neoplasm of uncertain behavior of skin: Secondary | ICD-10-CM | POA: Diagnosis not present

## 2019-02-18 DIAGNOSIS — L400 Psoriasis vulgaris: Secondary | ICD-10-CM | POA: Diagnosis not present

## 2019-02-18 DIAGNOSIS — L814 Other melanin hyperpigmentation: Secondary | ICD-10-CM | POA: Diagnosis not present

## 2019-02-18 DIAGNOSIS — Z79899 Other long term (current) drug therapy: Secondary | ICD-10-CM | POA: Diagnosis not present

## 2019-03-07 ENCOUNTER — Telehealth: Payer: Self-pay | Admitting: Family Medicine

## 2019-03-20 ENCOUNTER — Ambulatory Visit: Payer: Medicare HMO | Admitting: Neurology

## 2019-05-07 ENCOUNTER — Ambulatory Visit (INDEPENDENT_AMBULATORY_CARE_PROVIDER_SITE_OTHER): Payer: Medicare HMO | Admitting: Podiatry

## 2019-05-07 ENCOUNTER — Other Ambulatory Visit: Payer: Self-pay

## 2019-05-07 ENCOUNTER — Encounter: Payer: Self-pay | Admitting: Podiatry

## 2019-05-07 VITALS — Temp 97.9°F

## 2019-05-07 DIAGNOSIS — M79675 Pain in left toe(s): Secondary | ICD-10-CM

## 2019-05-07 DIAGNOSIS — B351 Tinea unguium: Secondary | ICD-10-CM | POA: Diagnosis not present

## 2019-05-07 DIAGNOSIS — M79674 Pain in right toe(s): Secondary | ICD-10-CM

## 2019-05-07 NOTE — Patient Instructions (Signed)

## 2019-05-14 ENCOUNTER — Other Ambulatory Visit: Payer: Self-pay | Admitting: *Deleted

## 2019-05-14 DIAGNOSIS — K5900 Constipation, unspecified: Secondary | ICD-10-CM

## 2019-05-14 MED ORDER — LUBIPROSTONE 24 MCG PO CAPS: ORAL_CAPSULE | ORAL | 0 refills | Status: DC

## 2019-05-15 ENCOUNTER — Other Ambulatory Visit: Payer: Self-pay | Admitting: Neurology

## 2019-05-18 NOTE — Progress Notes (Signed)
Subjective:  Judy Evans presents to clinic today with cc of  painful, thick, discolored, elongated toenails 1-5 b/l that become tender and cannot cut because of thickness.  Pain is aggravated when wearing enclosed shoe gear.  She states her plantar fasciitis of the left foot has resolved.  Baruch Gouty, FNP is her PCP.    Current Outpatient Medications:  .  acetaminophen (TYLENOL) 500 MG tablet, Take 1,000 mg by mouth every 6 (six) hours as needed for mild pain., Disp: , Rfl:  .  albuterol (VENTOLIN HFA) 108 (90 Base) MCG/ACT inhaler, INHALE 2 PUFFS INTO THE LUNGS EVERY 4 (FOUR) HOURS AS NEEDED FOR WHEEZING OR SHORTNESS OF BREATH., Disp: 18 Inhaler, Rfl: 0 .  amitriptyline (ELAVIL) 25 MG tablet, TAKE 1 OR 2 TABLETS BY MOUTH AT BEDTIME, Disp: 180 tablet, Rfl: 3 .  cetirizine (ZYRTEC) 10 MG tablet, TAKE 1 TABLET (10 MG TOTAL) BY MOUTH DAILY., Disp: 30 tablet, Rfl: 8 .  COSENTYX SENSOREADY, 300 MG, 150 MG/ML SOAJ, , Disp: , Rfl:  .  fluconazole (DIFLUCAN) 150 MG tablet, Take 1 tablet (150 mg total) by mouth every three (3) days as needed., Disp: 3 tablet, Rfl: 0 .  fluticasone (FLONASE) 50 MCG/ACT nasal spray, SPRAY 2 SPRAYS INTO EACH NOSTRIL EVERY DAY, Disp: 48 g, Rfl: 1 .  fluticasone furoate-vilanterol (BREO ELLIPTA) 100-25 MCG/INH AEPB, Inhale 1 puff into the lungs daily., Disp: 90 each, Rfl: 1 .  gabapentin (NEURONTIN) 800 MG tablet, TAKE 1 TABLET (800 MG TOTAL) BY MOUTH 4 (FOUR) TIMES DAILY., Disp: 360 tablet, Rfl: 0 .  HUMIRA PEN-PS/UV/ADOL HS START 80 MG/0.8ML & 40MG /0.4ML PNKT, , Disp: , Rfl:  .  ibuprofen (ADVIL,MOTRIN) 600 MG tablet, Take 1 tablet (600 mg total) by mouth every 8 (eight) hours as needed., Disp: 15 tablet, Rfl: 0 .  lidocaine (LIDODERM) 5 %, Place 1 patch onto the skin daily. Remove & Discard patch within 12 hours or as directed by MD, Disp: 30 patch, Rfl: 2 .  lubiprostone (AMITIZA) 24 MCG capsule, (Needs to be seen before next refill)TAKE 1 CAPSULE (24 MCG  TOTAL) BY MOUTH 2 (TWO) TIMES DAILY WITH A MEAL., Disp: 60 capsule, Rfl: 0 .  nystatin (MYCOSTATIN) 100000 UNIT/ML suspension, Take 1 teaspoon, swish, gargle for 1 minute and then spit up 4 times a day until symptoms improve., Disp: 60 mL, Rfl: 1 .  nystatin ointment (MYCOSTATIN), APPLY 1 APPLICATION TOPICALLY 2 (TWO) TIMES DAILY AS NEEDED., Disp: 30 g, Rfl: 0 .  omeprazole (PRILOSEC) 40 MG capsule, TAKE 1 CAPSULE BY MOUTH EVERY DAY, Disp: 90 capsule, Rfl: 1 .  SUMAtriptan (IMITREX) 100 MG tablet, Take 1 tablet (100 mg total) by mouth once as needed for up to 1 dose for migraine. May repeat in 2 hours if headache persists or recurs., Disp: 10 tablet, Rfl: 2 .  tiZANidine (ZANAFLEX) 4 MG tablet, TAKE 1 TO 2 TABLETS BY MOUTH NIGHTLY, Disp: 180 tablet, Rfl: 1 .  triamcinolone cream (KENALOG) 0.1 %, , Disp: , Rfl:  .  triamcinolone lotion (KENALOG) 0.1 %, Apply 1 application topically 2 (two) times daily., Disp: , Rfl:  .  venlafaxine (EFFEXOR) 75 MG tablet, Take 1 tablet (75 mg total) by mouth 2 (two) times daily with a meal., Disp: 180 tablet, Rfl: 1   Allergies  Allergen Reactions  . Rosuvastatin Calcium Swelling    Ends up in the hospital  . Statins Swelling    Ends up in the hospital  . Wellbutrin [Bupropion]  suicidal  . Hctz [Hydrochlorothiazide] Nausea And Vomiting and Rash  . Hydrocodone-Acetaminophen Rash  . Oxycodone Rash  . Penicillins Nausea And Vomiting and Rash    Has patient had a PCN reaction causing immediate rash, facial/tongue/throat swelling, SOB or lightheadedness with hypotension: Yes Has patient had a PCN reaction causing severe rash involving mucus membranes or skin necrosis: No Has patient had a PCN reaction that required hospitalization No Has patient had a PCN reaction occurring within the last 10 years: No If all of the above answers are "NO", then may proceed with Cephalosporin use.      Objective: Vitals:   05/07/19 1354  Temp: 97.9 F (36.6 C)     Physical Examination:  Vascular Examination: Capillary refill time immediate x 10 digits.  Palpable DP/PT pulses b/l.  Digital hair present b/l.  No edema noted b/l.  Skin temperature gradient WNL b/l.  Dermatological Examination: Skin with normal turgor, texture and tone b/l.  No open wounds b/l.  No interdigital macerations noted b/l.  Elongated, thick, discolored brittle toenails with subungual debris and pain on dorsal palpation of nailbeds 1-5 b/l.  Musculoskeletal Examination: Muscle strength 5/5 b/l  to all muscle groups.  No pain on palpation left medial tubercle.   No pain, crepitus or joint discomfort with active/passive ROM.  Neurological Examination: Sensation intact 5/5 b/l with 10 gram monofilament.  Vibratory sensation intact b/l.  Proprioceptive sensation intact b/l.  Assessment: Mycotic nail infection with pain 1-5 b/l  Plan: 1. Toenails 1-5 b/l were debrided in length and girth without iatrogenic laceration. 2.  Continue soft, supportive shoe gear daily. 3.  Report any pedal injuries to medical professional. 4.  Follow up 3 months. 5.  Patient/POA to call should there be a question/concern in there interim.

## 2019-05-20 ENCOUNTER — Encounter: Payer: Self-pay | Admitting: Neurology

## 2019-05-20 ENCOUNTER — Other Ambulatory Visit: Payer: Self-pay

## 2019-05-20 ENCOUNTER — Ambulatory Visit (INDEPENDENT_AMBULATORY_CARE_PROVIDER_SITE_OTHER): Payer: Medicare HMO | Admitting: Neurology

## 2019-05-20 VITALS — BP 122/72 | HR 101 | Temp 97.8°F | Ht 62.0 in | Wt 218.0 lb

## 2019-05-20 DIAGNOSIS — M542 Cervicalgia: Secondary | ICD-10-CM

## 2019-05-20 DIAGNOSIS — G43709 Chronic migraine without aura, not intractable, without status migrainosus: Secondary | ICD-10-CM

## 2019-05-20 DIAGNOSIS — M797 Fibromyalgia: Secondary | ICD-10-CM

## 2019-05-20 DIAGNOSIS — M5412 Radiculopathy, cervical region: Secondary | ICD-10-CM | POA: Diagnosis not present

## 2019-05-20 DIAGNOSIS — IMO0002 Reserved for concepts with insufficient information to code with codable children: Secondary | ICD-10-CM

## 2019-05-20 MED ORDER — EMGALITY 120 MG/ML ~~LOC~~ SOSY: 1.0000 mL | PREFILLED_SYRINGE | SUBCUTANEOUS | 11 refills | Status: DC

## 2019-05-20 NOTE — Progress Notes (Signed)
[facility]  PATIENT: KAITLYN ZOCCO DOB: 19-Aug-1971  REFERRING DOCTOR OR PCP:  Dr. Evette Doffing SOURCE: Patient, notes from Dr. Evette Doffing imaging reports, MRI images on PACS.  _________________________________   HISTORICAL  CHIEF COMPLAINT:  Chief Complaint  Patient presents with   Follow-up    RM 12. Last seen 11/19/2018   Migraine    She estimates three migraines each month. Taking gabapentin 800mg  po QID, amitriptyline 25mg  (1-2 po qhs), tizanidine qhs.   Insomnia    She still struggles with some sleepness nights.      HISTORY OF PRESENT ILLNESS:  Chryel Stinson is a 48 y.o. woman with headaches, shoulder pain and neck pain.  Update 05/20/2019: She feels she is doing a little better with fewer headache days..   She is still having 3 severe headaches a month with nausea and photophobia.  These can be intense.   However, she continues to have 15 days or so of milder headaches a month..  The intensity is not as bad as it was at the last visit.  Milder headaches are often occipital.  She takes amitriptyline 50 mg nightly and gabapentin 800 mg po tid.   these medications have only mildly helped.  Topiramate made her feel sick.   She got some benefit from trigger point injections performed at the last visit that lasted a couple months.  She feels her weight is stable.     Update 11/19/2018: After the last visit when we do a splenius capitis/occipital nerve block headaches improved.  Over the last 2 to 3 weeks the headaches have returned.  She has a pressure-like sensation in the head and neck.    Pain starts in the neck and radiates up.   Her neck feels stiff.   Pain is worse with noises and bright lights and best with rest.    Additionally she is having nausea.  Tylenol slightly takes the edge off for a few hours.   She is on amitriptyline.      Update 07/18/2018: She reports a lot of headache in the back of the head on the left, rarely on both sides.   Pain  is aching in quality.   She has nausea and some vomiting   Bending over and noise and movements worsens the pain.    Nothing makes it better.    She denies specific arm weakness but notes general arm > leg weakness.   Bladder is doing well.  Gait is fine.    She sees Dr. Francesco Runner for Pain management and had a recent cervical ESI --- this helped pain a short time.   She also notes some muscle aches and has been diagnosed with FMS.   She has some sleep maintenance insomnia.     NCV/EMG 04/23/18 Impression: This NCV/EMG study shows the following: 1.   Mild chronic left C7 radiculopathy without active features 2.   No evidence of median or ulnar neuropathies.    Her maternal grandfather and maternal great grandfather had cerebral aneurysm.     Update 03/18/2018: She feels her migraines are helping a lot since the amitriptyline was titrated up.     She does not get the severe ones anymore and most of the headaches are short and mild.    At a prior visit, we did a shoulder injection and the pain was much better until a few months ago.  Pain is worse with driving and with elevating the arm.    She will note  some numbness in her hand at times.     She also has left hip and buttock pain.   A trochanteric bursa injection helped her a lot in the past.      She has numbness in her arms, left > right.  This fluctuates some.   MRI shows C3C4 > C5C6 DJD with foraminal narrowing to the left.       From 03/13/2017:  Her more constant headache and migraines are better on amitriptyline but she continues to get the sharp pain at times that is felt mostly in the right temple.    She tolerates the amitriptyline well.   HA History:   She has had headaches off and on x many years but they worsened 4-5 years ago. She had three especially bad headaches associated with difficulty speaking.   She saw a neurologist at [facility].   She was placed on amitriptyline with some benefit.   However, when she moved, her new PCP would  not right amitriptyline for her.   She was referred to a pain management practice.    When they occur, she often gets sharp pains on the right.   Pain is stabbing.   She gets nausea but rarely has vomiting.   She has had no furhter migraine with aphasia.  Moving increases her pain.   Bright lights and noises increases her pain.  Amitriptyline has helped the headaches Triptans help sometimes but not always.   Tylenol arthritis helps a little bit.   .  FMS/neck pain/LBP:  She still notes the fibromyalgia pain, mostly in the back, neck and hips.     She also has left hip bursitis and at times it seems like the leg is going to give out.     She is on Cymbalta and gabapentin (800 mg po qid) with some benefit.   She is also on Dilaudid 2 mg qid with benfit.  She has had lumbar spinal injections with benefit.    She sees Dr. Francesco Runner for Pain Management [facility]) for pain management.        Shoulder pain:   The left shoulder pain did better withteh subacromial bursa injection but benefit was only about 2 weeks. Pain increases with elevation and external rotation.    I personally reviewed an MRI of the cervical spine dated 07/14/2016. At C3-C4, she has a disc osteophyte complex causing moderate left foraminal narrowing with some encroachment upon the left C4 nerve root. At C5-C6 she has mild left foraminal narrowing due to a small disc osteophyte complex. There does not appear to be any nerve root compression.  REVIEW OF SYSTEMS: Constitutional: No fevers, chills, sweats, or change in appetite Eyes: No visual changes, double vision, eye pain Ear, nose and throat: No hearing loss, ear pain, nasal congestion, sore throat Cardiovascular: No chest pain, palpitations Respiratory: No shortness of breath at rest or with exertion.   No wheezes GastrointestinaI: has Irritable bowel Genitourinary: No dysuria, urinary retention or frequency.  No nocturia. Musculoskeletal: as above, fibromyalgia/neck pain/  shoulder pain/back pain Integumentary: Has psoriasis Neurological: as above Psychiatric: No depression at this time.  No anxiety Endocrine: has pre-diabetesNo palpitations, diaphoresis, change in appetite, change in weigh or increased thirst Hematologic/Lymphatic: No anemia, purpura, petechiae. Allergic/Immunologic: No itchy/runny eyes, nasal congestion, recent allergic reactions, rashes  ALLERGIES: Allergies  Allergen Reactions   Rosuvastatin Calcium Swelling    Ends up in the hospital   Statins Swelling    Ends up in the hospital  Wellbutrin [Bupropion]     suicidal   Hctz [Hydrochlorothiazide] Nausea And Vomiting and Rash   Hydrocodone-Acetaminophen Rash   Oxycodone Rash   Penicillins Nausea And Vomiting and Rash    Has patient had a PCN reaction causing immediate rash, facial/tongue/throat swelling, SOB or lightheadedness with hypotension: Yes Has patient had a PCN reaction causing severe rash involving mucus membranes or skin necrosis: No Has patient had a PCN reaction that required hospitalization No Has patient had a PCN reaction occurring within the last 10 years: No If all of the above answers are "NO", then may proceed with Cephalosporin use.     HOME MEDICATIONS:  Current Outpatient Medications:    acetaminophen (TYLENOL) 500 MG tablet, Take 1,000 mg by mouth every 6 (six) hours as needed for mild pain., Disp: , Rfl:    albuterol (VENTOLIN HFA) 108 (90 Base) MCG/ACT inhaler, INHALE 2 PUFFS INTO THE LUNGS EVERY 4 (FOUR) HOURS AS NEEDED FOR WHEEZING OR SHORTNESS OF BREATH., Disp: 18 Inhaler, Rfl: 0   amitriptyline (ELAVIL) 25 MG tablet, TAKE 1 OR 2 TABLETS BY MOUTH AT BEDTIME, Disp: 180 tablet, Rfl: 3   cetirizine (ZYRTEC) 10 MG tablet, TAKE 1 TABLET (10 MG TOTAL) BY MOUTH DAILY., Disp: 30 tablet, Rfl: 8   COSENTYX SENSOREADY, 300 MG, 150 MG/ML SOAJ, , Disp: , Rfl:    fluconazole (DIFLUCAN) 150 MG tablet, Take 1 tablet (150 mg total) by mouth every three  (3) days as needed., Disp: 3 tablet, Rfl: 0   fluticasone (FLONASE) 50 MCG/ACT nasal spray, SPRAY 2 SPRAYS INTO EACH NOSTRIL EVERY DAY, Disp: 48 g, Rfl: 1   fluticasone furoate-vilanterol (BREO ELLIPTA) 100-25 MCG/INH AEPB, Inhale 1 puff into the lungs daily., Disp: 90 each, Rfl: 1   gabapentin (NEURONTIN) 800 MG tablet, TAKE 1 TABLET (800 MG TOTAL) BY MOUTH 4 (FOUR) TIMES DAILY., Disp: 360 tablet, Rfl: 0   ibuprofen (ADVIL,MOTRIN) 600 MG tablet, Take 1 tablet (600 mg total) by mouth every 8 (eight) hours as needed., Disp: 15 tablet, Rfl: 0   lidocaine (LIDODERM) 5 %, Place 1 patch onto the skin daily. Remove & Discard patch within 12 hours or as directed by MD, Disp: 30 patch, Rfl: 2   lubiprostone (AMITIZA) 24 MCG capsule, (Needs to be seen before next refill)TAKE 1 CAPSULE (24 MCG TOTAL) BY MOUTH 2 (TWO) TIMES DAILY WITH A MEAL., Disp: 60 capsule, Rfl: 0   nystatin (MYCOSTATIN) 100000 UNIT/ML suspension, Take 1 teaspoon, swish, gargle for 1 minute and then spit up 4 times a day until symptoms improve., Disp: 60 mL, Rfl: 1   nystatin ointment (MYCOSTATIN), APPLY 1 APPLICATION TOPICALLY 2 (TWO) TIMES DAILY AS NEEDED., Disp: 30 g, Rfl: 0   omeprazole (PRILOSEC) 40 MG capsule, TAKE 1 CAPSULE BY MOUTH EVERY DAY, Disp: 90 capsule, Rfl: 1   SUMAtriptan (IMITREX) 100 MG tablet, Take 1 tablet (100 mg total) by mouth once as needed for up to 1 dose for migraine. May repeat in 2 hours if headache persists or recurs., Disp: 10 tablet, Rfl: 2   tiZANidine (ZANAFLEX) 4 MG tablet, TAKE 1 TO 2 TABLETS BY MOUTH NIGHTLY, Disp: 180 tablet, Rfl: 1   triamcinolone cream (KENALOG) 0.1 %, , Disp: , Rfl:    triamcinolone lotion (KENALOG) 0.1 %, Apply 1 application topically 2 (two) times daily., Disp: , Rfl:   PAST MEDICAL HISTORY: Past Medical History:  Diagnosis Date   Anxiety    Aphasia    Arthritis    COPD (chronic obstructive  pulmonary disease) ([place])    DDD (degenerative disc disease),  cervical    DDD (degenerative disc disease), lumbar    Degenerative disc disease at L5-S1 level    Diabetes mellitus without complication ([place])    States she does not have   Fibromyalgia    GERD (gastroesophageal reflux disease)    H. pylori infection    Hyperlipidemia    States she does not have   Hypertension    States she does not have   IBS (irritable bowel syndrome)    LLQ pain 06/18/2015   Migraines    Moody 01/22/2015   Pain with urination 02/19/2015   Psoriasis    Scoliosis    Spondylolysis     PAST SURGICAL HISTORY: Past Surgical History:  Procedure Laterality Date   ABDOMINAL HYSTERECTOMY     BIOPSY  09/14/2016   Procedure: BIOPSY;  Surgeon: Daneil Dolin, MD;  Location: [facility];  Service: Endoscopy;;  gastric    CARDIAC CATHETERIZATION  2015   patient reported it to be normal. "I was dying in pain during procedure".    CARPAL TUNNEL RELEASE Right    CESAREAN SECTION     CHOLECYSTECTOMY     COLONOSCOPY  06/2014   Altamese Dilling DeMason: normal   DILATION AND CURETTAGE OF UTERUS     ESOPHAGOGASTRODUODENOSCOPY  07/2014   Burke Keels: Normal   ESOPHAGOGASTRODUODENOSCOPY (EGD) WITH PROPOFOL N/A 09/14/2016   Dr. Gala Romney: Normal esophagus status post empiric dilation, erosive gastropathy with biopsies showing chronic inactive gastritis, no H pylori.   FOOT SURGERY Bilateral    removal of heel spurs   MALONEY DILATION  09/14/2016   Procedure: MALONEY DILATION;  Surgeon: Daneil Dolin, MD;  Location: [facility];  Service: Endoscopy;;   rotator cuff surgery Right    TONSILLECTOMY      FAMILY HISTORY: Family History  Problem Relation Age of Onset   COPD Mother    Diabetes Mother    Osteoarthritis Mother    Atrial fibrillation Mother    Heart disease Mother    Healthy Father    Lupus Sister    CAD Sister    Diabetes Sister    Early death Brother        pneumonia   Asthma Daughter    Asthma Son    Cancer Maternal  Grandmother 83       colon   Arthritis Maternal Grandmother    Diabetes Maternal Grandmother    Dementia Maternal Grandmother    Other Maternal Grandfather        brain tumor   Cancer Paternal Grandmother        breast   Cancer Paternal Grandfather        lung   Other Paternal Grandfather        brain aneurysm   Factor V Leiden deficiency Sister    Arthritis Sister    Psoriasis Sister     SOCIAL HISTORY:  Social History   Socioeconomic History   Marital status: Legally Separated    Spouse name: Not on file   Number of children: 2   Years of education: GED   Highest education level: GED or equivalent  Occupational History   Occupation: Disabled  Scientist, product/process development strain: Somewhat hard   Food insecurity    Worry: Sometimes true    Inability: Sometimes true   Transportation needs    Medical: No    Non-medical: No  Tobacco Use   Smoking  status: Current Every Day Smoker    Packs/day: 1.50    Years: 30.00    Pack years: 45.00    Types: Cigarettes   Smokeless tobacco: Never Used  Substance and Sexual Activity   Alcohol use: No   Drug use: No   Sexual activity: Not Currently    Birth control/protection: Surgical    Comment: hyst  Lifestyle   Physical activity    Days per week: 0 days    Minutes per session: 0 min   Stress: Not at all  Relationships   Social connections    Talks on phone: More than three times a week    Gets together: More than three times a week    Attends religious service: Never    Active member of club or organization: No    Attends meetings of clubs or organizations: Never    Relationship status: Separated   Intimate partner violence    Fear of current or ex partner: No    Emotionally abused: No    Physically abused: No    Forced sexual activity: No  Other Topics Concern   Not on file  Social History Narrative   Caffeine use: Drinks coffee (2 cups per day), tea/soda- 2-3 cups per day    Right handed   Lives alone     PHYSICAL EXAM  Vitals:   05/20/19 1604  Temp: 97.8 F (36.6 C)  Weight: 218 lb (98.9 kg)  Height: 5\' 2"  (1.575 m)    Body mass index is 39.87 kg/m.   General: The patient is well-developed and well-nourished and in no acute distress   Musculoskeletal: She has tenderness over the splenius capitis muscle/occipital nerves bilaterally, left greater than right.  There is milder tenderness at the cervical paraspinal muscles.  There is also some tenderness over some the other classic fibromyalgia tender points.  .   Neurologic Exam  Mental status: The patient is alert and oriented x 3 at the time of the examination. The patient has apparent normal recent and remote memory, with an apparently normal attention span and concentration ability.   Speech is normal.  Cranial nerves: Extraocular muscles are intact.  Facial strength and sensation is normal.  Trapezius strength is normal.  The tongue is midline, and the patient has symmetric elevation of the soft palate. No obvious hearing deficits are noted.  Motor:  Muscle bulk is normal.   Muscle tone is normal. Strength is 5/5 in the arms or legs..   Sensory: Sensory testing is intact to pinprick, soft touch and vibration sensation in all 4 extremities.  Coordination: Cerebellar testing reveals good finger-nose-finger bilaterally.  Gait and station: Station is normal.   Gait is normal.  Tandem gait is minimally wide.  Romberg is negative.  Reflexes: Deep tendon reflexes are symmetric and normal bilaterally.        DIAGNOSTIC DATA (LABS, IMAGING, TESTING) - I reviewed patient records, labs, notes, testing and imaging myself where available.  Lab Results  Component Value Date   WBC 9.6 03/22/2018   HGB 16.3 (H) 03/22/2018   HCT 48.3 (H) 03/22/2018   MCV 91 03/22/2018   PLT 277 03/22/2018      Component Value Date/Time   NA 143 03/22/2018 1316   K 3.8 03/22/2018 1316   CL 104 03/22/2018 1316    CO2 24 03/22/2018 1316   GLUCOSE 116 (H) 03/22/2018 1316   GLUCOSE 98 07/07/2016 1525   BUN 10 03/22/2018 1316   CREATININE 0.86 03/22/2018  1316   CREATININE 0.76 07/07/2016 1525   CALCIUM 9.3 03/22/2018 1316   PROT 6.8 03/22/2018 1316   ALBUMIN 4.5 03/22/2018 1316   AST 13 03/22/2018 1316   ALT 12 03/22/2018 1316   ALKPHOS 102 03/22/2018 1316   BILITOT 0.4 03/22/2018 1316   GFRNONAA 81 03/22/2018 1316   GFRAA 94 03/22/2018 1316   Lab Results  Component Value Date   CHOL 199 01/22/2017   HDL 46 01/22/2017   LDLCALC 116 (H) 01/22/2017   TRIG 187 (H) 01/22/2017   CHOLHDL 4.3 01/22/2017   Lab Results  Component Value Date   HGBA1C 5.5 03/22/2018   Lab Results  Component Value Date   VITAMINB12 283 09/15/2016   Lab Results  Component Value Date   TSH 1.230 03/22/2018       ASSESSMENT AND PLAN    1. Chronic migraine   2. Neck pain   3. Cervical radiculopathy   4. Fibromyalgia affecting multiple sites      1.   Bilateral splenius capitis and bilateral C6-C7 paraspinal muscle trigger point injection with 80 mg Depo-Medrol in 4 cc Lidocaine using sterile technique.   She tolerated the procedures well with no new complications.     2.   Emgality for chronic migraine.   She will continue gabapentin and amitriptyline but consider reduction if better 3.    Stay active and exercise as tolerated.   4.    Return to see me in 6 months and call sooner if she has significant new or worsening symptoms or based on the results of the MRI.   Sherina Stammer A. Felecia Shelling, MD, PhD [id number], [id number] PM Certified in Neurology, Clinical Neurophysiology, Sleep Medicine, Pain Medicine and Neuroimaging  [facility] [address], [place], [place] [address] [phone]

## 2019-06-23 DIAGNOSIS — Z79899 Other long term (current) drug therapy: Secondary | ICD-10-CM | POA: Diagnosis not present

## 2019-06-23 DIAGNOSIS — L400 Psoriasis vulgaris: Secondary | ICD-10-CM | POA: Diagnosis not present

## 2019-07-27 ENCOUNTER — Other Ambulatory Visit: Payer: Self-pay | Admitting: Family Medicine

## 2019-07-27 DIAGNOSIS — K219 Gastro-esophageal reflux disease without esophagitis: Secondary | ICD-10-CM

## 2019-07-27 DIAGNOSIS — J309 Allergic rhinitis, unspecified: Secondary | ICD-10-CM

## 2019-08-05 ENCOUNTER — Other Ambulatory Visit: Payer: Self-pay | Admitting: Family Medicine

## 2019-08-05 DIAGNOSIS — J449 Chronic obstructive pulmonary disease, unspecified: Secondary | ICD-10-CM

## 2019-08-06 ENCOUNTER — Ambulatory Visit (INDEPENDENT_AMBULATORY_CARE_PROVIDER_SITE_OTHER): Payer: Medicare HMO | Admitting: Podiatry

## 2019-08-06 ENCOUNTER — Other Ambulatory Visit: Payer: Self-pay

## 2019-08-06 DIAGNOSIS — B351 Tinea unguium: Secondary | ICD-10-CM | POA: Diagnosis not present

## 2019-08-06 DIAGNOSIS — M79675 Pain in left toe(s): Secondary | ICD-10-CM | POA: Diagnosis not present

## 2019-08-06 DIAGNOSIS — M792 Neuralgia and neuritis, unspecified: Secondary | ICD-10-CM | POA: Diagnosis not present

## 2019-08-06 DIAGNOSIS — M79674 Pain in right toe(s): Secondary | ICD-10-CM

## 2019-08-06 NOTE — Patient Instructions (Addendum)
Please see your primary care physician to be tested for Diabetes  Peripheral Neuropathy Peripheral neuropathy is a type of nerve damage. It affects nerves that carry signals between the spinal cord and the arms, legs, and the rest of the body (peripheral nerves). It does not affect nerves in the spinal cord or brain. In peripheral neuropathy, one nerve or a group of nerves may be damaged. Peripheral neuropathy is a broad category that includes many specific nerve disorders, like diabetic neuropathy, hereditary neuropathy, and carpal tunnel syndrome. What are the causes? This condition may be caused by:  Diabetes. This is the most common cause of peripheral neuropathy.  Nerve injury.  Pressure or stress on a nerve that lasts a long time.  Lack (deficiency) of B vitamins. This can result from alcoholism, poor diet, or a restricted diet.  Infections.  Autoimmune diseases, such as rheumatoid arthritis and systemic lupus erythematosus.  Nerve diseases that are passed from parent to child (inherited).  Some medicines, such as cancer medicines (chemotherapy).  Poisonous (toxic) substances, such as lead and mercury.  Too little blood flowing to the legs.  Kidney disease.  Thyroid disease. In some cases, the cause of this condition is not known. What are the signs or symptoms? Symptoms of this condition depend on which of your nerves is damaged. Common symptoms include:  Loss of feeling (numbness) in the feet, hands, or both.  Tingling in the feet, hands, or both.  Burning pain.  Very sensitive skin.  Weakness.  Not being able to move a part of the body (paralysis).  Muscle twitching.  Clumsiness or poor coordination.  Loss of balance.  Not being able to control your bladder.  Feeling dizzy.  Sexual problems. How is this diagnosed? Diagnosing and finding the cause of peripheral neuropathy can be difficult. Your health care provider will take your medical history and  do a physical exam. A neurological exam will also be done. This involves checking things that are affected by your brain, spinal cord, and nerves (nervous system). For example, your health care provider will check your reflexes, how you move, and what you can feel. You may have other tests, such as:  Blood tests.  Electromyogram (EMG) and nerve conduction tests. These tests check nerve function and how well the nerves are controlling the muscles.  Imaging tests, such as CT scans or MRI to rule out other causes of your symptoms.  Removing a small piece of nerve to be examined in a lab (nerve biopsy). This is rare.  Removing and examining a small amount of the fluid that surrounds the brain and spinal cord (lumbar puncture). This is rare. How is this treated? Treatment for this condition may involve:  Treating the underlying cause of the neuropathy, such as diabetes, kidney disease, or vitamin deficiencies.  Stopping medicines that can cause neuropathy, such as chemotherapy.  Medicine to relieve pain. Medicines may include: ? Prescription or over-the-counter pain medicine. ? Antiseizure medicine. ? Antidepressants. ? Pain-relieving patches that are applied to painful areas of skin.  Surgery to relieve pressure on a nerve or to destroy a nerve that is causing pain.  Physical therapy to help improve movement and balance.  Devices to help you move around (assistive devices). Follow these instructions at home: Medicines  Take over-the-counter and prescription medicines only as told by your health care provider. Do not take any other medicines without first asking your health care provider.  Do not drive or use heavy machinery while taking prescription pain  medicine. Lifestyle   Do not use any products that contain nicotine or tobacco, such as cigarettes and e-cigarettes. Smoking keeps blood from reaching damaged nerves. If you need help quitting, ask your health care provider.   Avoid or limit alcohol. Too much alcohol can cause a vitamin B deficiency, and vitamin B is needed for healthy nerves.  Eat a healthy diet. This includes: ? Eating foods that are high in fiber, such as fresh fruits and vegetables, whole grains, and beans. ? Limiting foods that are high in fat and processed sugars, such as fried or sweet foods. General instructions   If you have diabetes, work closely with your health care provider to keep your blood sugar under control.  If you have numbness in your feet: ? Check every day for signs of injury or infection. Watch for redness, warmth, and swelling. ? Wear padded socks and comfortable shoes. These help protect your feet.  Develop a good support system. Living with peripheral neuropathy can be stressful. Consider talking with a mental health specialist or joining a support group.  Use assistive devices and attend physical therapy as told by your health care provider. This may include using a walker or a cane.  Keep all follow-up visits as told by your health care provider. This is important. Contact a health care provider if:  You have new signs or symptoms of peripheral neuropathy.  You are struggling emotionally from dealing with peripheral neuropathy.  Your pain is not well-controlled. Get help right away if:  You have an injury or infection that is not healing normally.  You develop new weakness in an arm or leg.  You fall frequently. Summary  Peripheral neuropathy is when the nerves in the arms, or legs are damaged, resulting in numbness, weakness, or pain.  There are many causes of peripheral neuropathy, including diabetes, pinched nerves, vitamin deficiencies, autoimmune disease, and hereditary conditions.  Diagnosing and finding the cause of peripheral neuropathy can be difficult. Your health care provider will take your medical history, do a physical exam, and do tests, including blood tests and nerve function tests.   Treatment involves treating the underlying cause of the neuropathy and taking medicines to help control pain. Physical therapy and assistive devices may also help. This information is not intended to replace advice given to you by your health care provider. Make sure you discuss any questions you have with your health care provider. Document Released: 09/01/2002 Document Revised: 08/24/2017 Document Reviewed: 11/20/2016 Elsevier Patient Education  2020 [employer].

## 2019-08-13 ENCOUNTER — Encounter: Payer: Self-pay | Admitting: Podiatry

## 2019-08-13 NOTE — Progress Notes (Signed)
Subjective: Judy Evans presents today with painful, thick toenails 1-5 b/l that she cannot cut and which interfere with daily activities.  Pain is aggravated when wearing enclosed shoe gear.  Paitent states she is having foot spasms with burning, stinging and pins/needles sensations. She relates she is taking 800 mg of gabapentin up to 4 times/day. On most days, she takes them 2-3 times per day. She has h/o fibromyalgia and is followed by Neurology.  She states she was considered prediabetic and lost weight. She hasn't had A1c performed since 2019.  Baruch Gouty, FNP is her PCP.   Current Outpatient Medications on File Prior to Visit  Medication Sig Dispense Refill  . acetaminophen (TYLENOL) 500 MG tablet Take 1,000 mg by mouth every 6 (six) hours as needed for mild pain.    Marland Kitchen albuterol (VENTOLIN HFA) 108 (90 Base) MCG/ACT inhaler INHALE 2 PUFFS INTO THE LUNGS EVERY 4 (FOUR) HOURS AS NEEDED FOR WHEEZING OR SHORTNESS OF BREATH.(Needs to be seen before next refill) 6.7 g 0  . amitriptyline (ELAVIL) 25 MG tablet TAKE 1 OR 2 TABLETS BY MOUTH AT BEDTIME 180 tablet 3  . cetirizine (ZYRTEC) 10 MG tablet TAKE 1 TABLET (10 MG TOTAL) BY MOUTH DAILY. 30 tablet 8  . COSENTYX SENSOREADY, 300 MG, 150 MG/ML SOAJ     . fluconazole (DIFLUCAN) 150 MG tablet Take 1 tablet (150 mg total) by mouth every three (3) days as needed. 3 tablet 0  . fluticasone (FLONASE) 50 MCG/ACT nasal spray SPRAY 2 SPRAYS INTO EACH NOSTRIL EVERY DAY 48 mL 1  . fluticasone furoate-vilanterol (BREO ELLIPTA) 100-25 MCG/INH AEPB Inhale 1 puff into the lungs daily. 90 each 1  . gabapentin (NEURONTIN) 800 MG tablet TAKE 1 TABLET (800 MG TOTAL) BY MOUTH 4 (FOUR) TIMES DAILY. 360 tablet 0  . Galcanezumab-gnlm (EMGALITY) 120 MG/ML SOSY Inject 1 mL into the skin every 28 (twenty-eight) days. 1.12 mL 11  . ibuprofen (ADVIL,MOTRIN) 600 MG tablet Take 1 tablet (600 mg total) by mouth every 8 (eight) hours as needed. 15 tablet 0  .  lidocaine (LIDODERM) 5 % Place 1 patch onto the skin daily. Remove & Discard patch within 12 hours or as directed by MD 30 patch 2  . lubiprostone (AMITIZA) 24 MCG capsule (Needs to be seen before next refill)TAKE 1 CAPSULE (24 MCG TOTAL) BY MOUTH 2 (TWO) TIMES DAILY WITH A MEAL. 60 capsule 0  . nystatin (MYCOSTATIN) 100000 UNIT/ML suspension Take 1 teaspoon, swish, gargle for 1 minute and then spit up 4 times a day until symptoms improve. 60 mL 1  . nystatin ointment (MYCOSTATIN) APPLY 1 APPLICATION TOPICALLY 2 (TWO) TIMES DAILY AS NEEDED. 30 g 0  . omeprazole (PRILOSEC) 40 MG capsule TAKE 1 CAPSULE BY MOUTH EVERY DAY 90 capsule 1  . SUMAtriptan (IMITREX) 100 MG tablet Take 1 tablet (100 mg total) by mouth once as needed for up to 1 dose for migraine. May repeat in 2 hours if headache persists or recurs. 10 tablet 2  . tiZANidine (ZANAFLEX) 4 MG tablet TAKE 1 TO 2 TABLETS BY MOUTH NIGHTLY 180 tablet 1  . triamcinolone cream (KENALOG) 0.1 %     . triamcinolone lotion (KENALOG) 0.1 % Apply 1 application topically 2 (two) times daily.     No current facility-administered medications on file prior to visit.     Allergies  Allergen Reactions  . Rosuvastatin Calcium Swelling    Ends up in the hospital  . Statins Swelling  Ends up in the hospital  . Wellbutrin [Bupropion]     suicidal  . Hctz [Hydrochlorothiazide] Nausea And Vomiting and Rash  . Hydrocodone-Acetaminophen Rash  . Oxycodone Rash  . Penicillins Nausea And Vomiting and Rash    Has patient had a PCN reaction causing immediate rash, facial/tongue/throat swelling, SOB or lightheadedness with hypotension: Yes Has patient had a PCN reaction causing severe rash involving mucus membranes or skin necrosis: No Has patient had a PCN reaction that required hospitalization No Has patient had a PCN reaction occurring within the last 10 years: No If all of the above answers are "NO", then may proceed with Cephalosporin use.     Objective: There were no vitals filed for this visit.  Vascular Examination: Capillary refill time immediate x 10 digits.  Dorsalis pedis and Posterior tibial pulses palpable b/l.  Digital hair present b/l.   Skin temperature gradient WNL b/l.  Dermatological Examination: Skin with normal turgor, texture and tone b/l.  Toenails 1-5 b/l discolored, thick, dystrophic with subungual debris and pain with palpation to nailbeds due to thickness of nails.  Musculoskeletal: Muscle strength 5/5 to all LE muscle groups b/l.   No gross bony deformities b/l.  No pain, crepitus or joint limitation noted with ROM.   Neurological: Sensation intact with 10 gram monofilament.  Vibratory sensation intact.  Assessment: Painful onychomycosis toenails 1-5 b/l  Neuropathic pain  Plan: 1. Discussed neuropathic pain. She is followed by Neurology and I recommended she communicate her symptoms to them to discuss other options for her at this point. Also recommended she have another A1c performed. 2. Toenails 1-5 b/l were debrided in length and girth without iatrogenic bleeding. 3. Patient to continue soft, supportive shoe gear daily. 4. Patient to report any pedal injuries to medical professional immediately. 5. Follow up 3 months.  6. Patient/POA to call should there be a concern in the interim.

## 2019-09-04 ENCOUNTER — Ambulatory Visit (INDEPENDENT_AMBULATORY_CARE_PROVIDER_SITE_OTHER): Payer: Medicare HMO | Admitting: Family Medicine

## 2019-09-04 ENCOUNTER — Other Ambulatory Visit: Payer: Self-pay

## 2019-09-04 ENCOUNTER — Encounter: Payer: Self-pay | Admitting: Family Medicine

## 2019-09-04 DIAGNOSIS — B370 Candidal stomatitis: Secondary | ICD-10-CM | POA: Diagnosis not present

## 2019-09-04 DIAGNOSIS — J40 Bronchitis, not specified as acute or chronic: Secondary | ICD-10-CM

## 2019-09-04 DIAGNOSIS — J329 Chronic sinusitis, unspecified: Secondary | ICD-10-CM | POA: Diagnosis not present

## 2019-09-04 MED ORDER — MOXIFLOXACIN HCL 400 MG PO TABS: 400.0000 mg | ORAL_TABLET | Freq: Every day | ORAL | 0 refills | Status: DC

## 2019-09-04 MED ORDER — NYSTATIN 100000 UNIT/ML MT SUSP: OROMUCOSAL | 1 refills | Status: DC

## 2019-09-04 NOTE — Progress Notes (Signed)
Subjective:    Patient ID: Judy Evans, female    DOB: [birth year], 48 y.o.   MRN: [id number]   HPI: Judy Evans is a 48 y.o. female presenting for Symptoms include congestion, facial pain, nasal congestion,productive cough with thick phlegm. Brownish yellow or yellow chunks, post nasal drip and sinus pressure. There is no fever. Onset of symptoms was six days ago, gradually worsening since that time.     Depression screen [facility] 2/9 10/02/2018 09/12/2018 06/20/2018 03/22/2018 01/02/2018  Decreased Interest 0 0 0 0 0  Down, Depressed, Hopeless 0 0 0 0 0  PHQ - 2 Score 0 0 0 0 0  Altered sleeping - - - - 2  Tired, decreased energy - - - - 2  Change in appetite - - - - 0  Feeling bad or failure about yourself  - - - - 1  Trouble concentrating - - - - 1  Moving slowly or fidgety/restless - - - - 0  Suicidal thoughts - - - - 0  PHQ-9 Score - - - - 6  Difficult doing work/chores - - - - -  Some recent data might be hidden     Relevant past medical, surgical, family and social history reviewed and updated as indicated.  Interim medical history since our last visit reviewed. Allergies and medications reviewed and updated.  ROS:  Review of Systems   Social History   Tobacco Use  Smoking Status Current Every Day Smoker  . Packs/day: 1.50  . Years: 30.00  . Pack years: 45.00  . Types: Cigarettes  Smokeless Tobacco Never Used       Objective:     Wt Readings from Last 3 Encounters:  05/20/19 218 lb (98.9 kg)  11/19/18 221 lb 8 oz (100.5 kg)  10/02/18 223 lb (101.2 kg)     Exam deferred. Pt. Harboring due to COVID 19. Phone visit performed.   Assessment & Plan:   1. Sinobronchitis   2. Thrush     Meds ordered this encounter  Medications  . moxifloxacin (AVELOX) 400 MG tablet    Sig: Take 1 tablet (400 mg total) by mouth daily. Take all of these, for infection    Dispense:  10 tablet    Refill:  0  . nystatin (MYCOSTATIN) 100000 UNIT/ML suspension   Sig: Take 1 teaspoon, swish, gargle for 1 minute and then spit up 4 times a day until symptoms improve.    Dispense:  60 mL    Refill:  1    No orders of the defined types were placed in this encounter.     Diagnoses and all orders for this visit:  Sinobronchitis  Thrush -     nystatin (MYCOSTATIN) 100000 UNIT/ML suspension; Take 1 teaspoon, swish, gargle for 1 minute and then spit up 4 times a day until symptoms improve.  Other orders -     moxifloxacin (AVELOX) 400 MG tablet; Take 1 tablet (400 mg total) by mouth daily. Take all of these, for infection    Virtual Visit via telephone Note  I discussed the limitations, risks, security and privacy concerns of performing an evaluation and management service by telephone and the availability of in person appointments. The patient was identified with two identifiers. Pt.expressed understanding and agreed to proceed. Pt. Is at home. Dr. Livia Snellen is in his office.  Follow Up Instructions:   I discussed the assessment and treatment plan with the patient. The patient was provided an opportunity to  ask questions and all were answered. The patient agreed with the plan and demonstrated an understanding of the instructions.   The patient was advised to call back or seek an in-person evaluation if the symptoms worsen or if the condition fails to improve as anticipated.   Total minutes including chart review and phone contact time: 7   Follow up plan: No follow-ups on file.  Claretta Fraise, MD [place]

## 2019-09-05 ENCOUNTER — Other Ambulatory Visit: Payer: Self-pay | Admitting: Family Medicine

## 2019-09-05 DIAGNOSIS — R059 Cough, unspecified: Secondary | ICD-10-CM

## 2019-09-05 DIAGNOSIS — R05 Cough: Secondary | ICD-10-CM

## 2019-09-05 DIAGNOSIS — M5441 Lumbago with sciatica, right side: Secondary | ICD-10-CM

## 2019-10-03 ENCOUNTER — Ambulatory Visit (INDEPENDENT_AMBULATORY_CARE_PROVIDER_SITE_OTHER): Payer: Medicare HMO | Admitting: Family Medicine

## 2019-10-03 ENCOUNTER — Encounter: Payer: Self-pay | Admitting: Family Medicine

## 2019-10-03 DIAGNOSIS — R0989 Other specified symptoms and signs involving the circulatory and respiratory systems: Secondary | ICD-10-CM | POA: Diagnosis not present

## 2019-10-03 DIAGNOSIS — R062 Wheezing: Secondary | ICD-10-CM | POA: Diagnosis not present

## 2019-10-03 MED ORDER — GUAIFENESIN ER 600 MG PO TB12: 600.0000 mg | ORAL_TABLET | Freq: Two times a day (BID) | ORAL | 0 refills | Status: AC

## 2019-10-03 MED ORDER — PREDNISONE 20 MG PO TABS: ORAL_TABLET | ORAL | 0 refills | Status: DC

## 2019-10-03 NOTE — Progress Notes (Signed)
Virtual Visit via telephone Note Due to COVID-19 pandemic this visit was conducted virtually. This visit type was conducted due to national recommendations for restrictions regarding the COVID-19 Pandemic (e.g. social distancing, sheltering in place) in an effort to limit this patient's exposure and mitigate transmission in our community. All issues noted in this document were discussed and addressed.  A physical exam was not performed with this format.   I connected with Judy Evans on 10/03/2019 at 1045 by telephone and verified that I am speaking with the correct person using two identifiers. Judy Evans is currently located at home and family is currently with them during visit. The provider, Monia Pouch, FNP is located in their office at time of visit.  I discussed the limitations, risks, security and privacy concerns of performing an evaluation and management service by telephone and the availability of in person appointments. I also discussed with the patient that there may be a patient responsible charge related to this service. The patient expressed understanding and agreed to proceed.  Subjective:  Patient ID: Judy Evans, female    DOB: 09-29-1970, 49 y.o.   MRN: [id number]  Chief Complaint:  Wheezing   HPI: Judy Evans is a 49 y.o. female presenting on 10/03/2019 for Wheezing   Pt reports she was recently treated for a sinus infection with Avelox. Pt states she has completed her antibiotic therapy but still has some congestion and wheezing. States she is producing sputum at times. No chest pain, shortness of breath, fever, weakness, confusion, or fatigue.   Wheezing  This is a new problem. The current episode started 1 to 4 weeks ago. The problem occurs daily. The problem has been waxing and waning. Associated symptoms include coughing, rhinorrhea and sputum production. Pertinent negatives include no abdominal pain, chest pain, chills, coryza, diarrhea,  ear pain, fever, headaches, hemoptysis, neck pain, rash, shortness of breath, sore throat, swollen glands or vomiting.     Relevant past medical, surgical, family, and social history reviewed and updated as indicated.  Allergies and medications reviewed and updated.   Past Medical History:  Diagnosis Date  . Anxiety   . Aphasia   . Arthritis   . COPD (chronic obstructive pulmonary disease) ([place])   . DDD (degenerative disc disease), cervical   . DDD (degenerative disc disease), lumbar   . Degenerative disc disease at L5-S1 level   . Diabetes mellitus without complication ([place])    States she does not have  . Fibromyalgia   . GERD (gastroesophageal reflux disease)   . H. pylori infection   . Hyperlipidemia    States she does not have  . Hypertension    States she does not have  . IBS (irritable bowel syndrome)   . LLQ pain 06/18/2015  . Migraines   . [place] 01/22/2015  . Pain with urination 02/19/2015  . Psoriasis   . Scoliosis   . Spondylolysis     Past Surgical History:  Procedure Laterality Date  . ABDOMINAL HYSTERECTOMY    . BIOPSY  09/14/2016   Procedure: BIOPSY;  Surgeon: Daneil Dolin, MD;  Location: [facility];  Service: Endoscopy;;  gastric   . CARDIAC CATHETERIZATION  2015   patient reported it to be normal. "I was dying in pain during procedure".   . CARPAL TUNNEL RELEASE Right   . CESAREAN SECTION    . CHOLECYSTECTOMY    . COLONOSCOPY  06/2014   Burke Keels: normal  . DILATION AND CURETTAGE  OF UTERUS    . ESOPHAGOGASTRODUODENOSCOPY  07/2014   Burke Keels: Normal  . ESOPHAGOGASTRODUODENOSCOPY (EGD) WITH PROPOFOL N/A 09/14/2016   Dr. Gala Romney: Normal esophagus status post empiric dilation, erosive gastropathy with biopsies showing chronic inactive gastritis, no H pylori.  Marland Kitchen FOOT SURGERY Bilateral    removal of heel spurs  . MALONEY DILATION  09/14/2016   Procedure: Venia Minks DILATION;  Surgeon: Daneil Dolin, MD;  Location: [facility];  Service:  Endoscopy;;  . rotator cuff surgery Right   . TONSILLECTOMY      Social History   Socioeconomic History  . Marital status: Legally Separated    Spouse name: Not on file  . Number of children: 2  . Years of education: GED  . Highest education level: GED or equivalent  Occupational History  . Occupation: Disabled  Tobacco Use  . Smoking status: Current Every Day Smoker    Packs/day: 1.50    Years: 30.00    Pack years: 45.00    Types: Cigarettes  . Smokeless tobacco: Never Used  Substance and Sexual Activity  . Alcohol use: No  . Drug use: No  . Sexual activity: Not Currently    Birth control/protection: Surgical    Comment: hyst  Other Topics Concern  . Not on file  Social History Narrative   Caffeine use: Drinks coffee (2 cups per day), tea/soda- 2-3 cups per day   Right handed   Lives alone   Social Determinants of Health   Financial Resource Strain:   . Difficulty of Paying Living Expenses: Not on file  Food Insecurity:   . Worried About Charity fundraiser in the Last Year: Not on file  . Ran Out of Food in the Last Year: Not on file  Transportation Needs:   . Lack of Transportation (Medical): Not on file  . Lack of Transportation (Non-Medical): Not on file  Physical Activity:   . Days of Exercise per Week: Not on file  . Minutes of Exercise per Session: Not on file  Stress:   . Feeling of Stress : Not on file  Social Connections:   . Frequency of Communication with Friends and Family: Not on file  . Frequency of Social Gatherings with Friends and Family: Not on file  . Attends Religious Services: Not on file  . Active Member of Clubs or Organizations: Not on file  . Attends Archivist Meetings: Not on file  . Marital Status: Not on file  Intimate Partner Violence:   . Fear of Current or Ex-Partner: Not on file  . Emotionally Abused: Not on file  . Physically Abused: Not on file  . Sexually Abused: Not on file    Outpatient Encounter  Medications as of 10/03/2019  Medication Sig  . acetaminophen (TYLENOL) 500 MG tablet Take 1,000 mg by mouth every 6 (six) hours as needed for mild pain.  Marland Kitchen albuterol (VENTOLIN HFA) 108 (90 Base) MCG/ACT inhaler INHALE 2 PUFFS INTO THE LUNGS EVERY 4 (FOUR) HOURS AS NEEDED FOR WHEEZING OR SHORTNESS OF BREATH.(Needs to be seen before next refill)  . amitriptyline (ELAVIL) 25 MG tablet TAKE 1 OR 2 TABLETS BY MOUTH AT BEDTIME  . BREO ELLIPTA 100-25 MCG/INH AEPB TAKE 1 PUFF BY MOUTH EVERY DAY  . cetirizine (ZYRTEC) 10 MG tablet TAKE 1 TABLET (10 MG TOTAL) BY MOUTH DAILY.  Marland Kitchen COSENTYX SENSOREADY, 300 MG, 150 MG/ML SOAJ   . fluconazole (DIFLUCAN) 150 MG tablet Take 1 tablet (150 mg total) by  mouth every three (3) days as needed.  . fluticasone (FLONASE) 50 MCG/ACT nasal spray SPRAY 2 SPRAYS INTO EACH NOSTRIL EVERY DAY  . gabapentin (NEURONTIN) 800 MG tablet TAKE 1 TABLET (800 MG TOTAL) BY MOUTH 4 (FOUR) TIMES DAILY.  . Galcanezumab-gnlm (EMGALITY) 120 MG/ML SOSY Inject 1 mL into the skin every 28 (twenty-eight) days.  Marland Kitchen guaiFENesin (MUCINEX) 600 MG 12 hr tablet Take 1 tablet (600 mg total) by mouth 2 (two) times daily.  Marland Kitchen ibuprofen (ADVIL,MOTRIN) 600 MG tablet Take 1 tablet (600 mg total) by mouth every 8 (eight) hours as needed.  . lidocaine (LIDODERM) 5 % PLACE 1 PATCH ONTO THE SKIN DAILY. REMOVE & DISCARD PATCH WITHIN 12 HOURS OR AS DIRECTED BY MD  . lubiprostone (AMITIZA) 24 MCG capsule (Needs to be seen before next refill)TAKE 1 CAPSULE (24 MCG TOTAL) BY MOUTH 2 (TWO) TIMES DAILY WITH A MEAL.  Marland Kitchen moxifloxacin (AVELOX) 400 MG tablet Take 1 tablet (400 mg total) by mouth daily. Take all of these, for infection  . nystatin (MYCOSTATIN) 100000 UNIT/ML suspension Take 1 teaspoon, swish, gargle for 1 minute and then spit up 4 times a day until symptoms improve.  . nystatin ointment (MYCOSTATIN) APPLY 1 APPLICATION TOPICALLY 2 (TWO) TIMES DAILY AS NEEDED.  Marland Kitchen omeprazole (PRILOSEC) 40 MG capsule TAKE 1 CAPSULE  BY MOUTH EVERY DAY  . predniSONE (DELTASONE) 20 MG tablet 2 po at sametime daily for 5 days  . SUMAtriptan (IMITREX) 100 MG tablet Take 1 tablet (100 mg total) by mouth once as needed for up to 1 dose for migraine. May repeat in 2 hours if headache persists or recurs.  Marland Kitchen tiZANidine (ZANAFLEX) 4 MG tablet TAKE 1 TO 2 TABLETS BY MOUTH NIGHTLY  . triamcinolone cream (KENALOG) 0.1 %   . triamcinolone lotion (KENALOG) 0.1 % Apply 1 application topically 2 (two) times daily.  . [DISCONTINUED] fluticasone furoate-vilanterol (BREO ELLIPTA) 100-25 MCG/INH AEPB Inhale 1 puff into the lungs daily.  . [DISCONTINUED] lidocaine (LIDODERM) 5 % Place 1 patch onto the skin daily. Remove & Discard patch within 12 hours or as directed by MD   No facility-administered encounter medications on file as of 10/03/2019.    Allergies  Allergen Reactions  . Rosuvastatin Calcium Swelling    Ends up in the hospital  . Statins Swelling    Ends up in the hospital  . Wellbutrin [Bupropion]     suicidal  . Hctz [Hydrochlorothiazide] Nausea And Vomiting and Rash  . Hydrocodone-Acetaminophen Rash  . Oxycodone Rash  . Penicillins Nausea And Vomiting and Rash    Has patient had a PCN reaction causing immediate rash, facial/tongue/throat swelling, SOB or lightheadedness with hypotension: Yes Has patient had a PCN reaction causing severe rash involving mucus membranes or skin necrosis: No Has patient had a PCN reaction that required hospitalization No Has patient had a PCN reaction occurring within the last 10 years: No If all of the above answers are "NO", then may proceed with Cephalosporin use.     Review of Systems  Constitutional: Negative for activity change, appetite change, chills, diaphoresis, fatigue and fever.  HENT: Positive for congestion and rhinorrhea. Negative for ear pain and sore throat.   Eyes: Negative.   Respiratory: Positive for cough, sputum production and wheezing. Negative for hemoptysis, chest  tightness and shortness of breath.   Cardiovascular: Negative for chest pain, palpitations and leg swelling.  Gastrointestinal: Negative for abdominal pain, blood in stool, constipation, diarrhea, nausea and vomiting.  Endocrine: Negative.  Genitourinary: Negative for decreased urine volume, difficulty urinating, dysuria, frequency and urgency.  Musculoskeletal: Negative for arthralgias, myalgias and neck pain.  Skin: Negative.  Negative for rash.  Allergic/Immunologic: Negative.   Neurological: Negative for dizziness, weakness and headaches.  Hematological: Negative.   Psychiatric/Behavioral: Negative for confusion, hallucinations, sleep disturbance and suicidal ideas.  All other systems reviewed and are negative.        Observations/Objective: No vital signs or physical exam, this was a telephone or virtual health encounter.  Pt alert and oriented, answers all questions appropriately, and able to speak in full sentences.    Assessment and Plan: Peregrine was seen today for wheezing.  Diagnoses and all orders for this visit:  Wheezing Chest congestion Ongoing wheezing and chest congestion post sinusitis treatment with Avelox. Will give burst of steroids and Mucinex. Pt aware to drink plenty of water with the Mucinex. Report any new, worsening, or persistent symptoms.  -     predniSONE (DELTASONE) 20 MG tablet; 2 po at sametime daily for 5 days -     guaiFENesin (MUCINEX) 600 MG 12 hr tablet; Take 1 tablet (600 mg total) by mouth 2 (two) times daily.     Follow Up Instructions: Return if symptoms worsen or fail to improve.    I discussed the assessment and treatment plan with the patient. The patient was provided an opportunity to ask questions and all were answered. The patient agreed with the plan and demonstrated an understanding of the instructions.   The patient was advised to call back or seek an in-person evaluation if the symptoms worsen or if the condition fails to  improve as anticipated.  The above assessment and management plan was discussed with the patient. The patient verbalized understanding of and has agreed to the management plan. Patient is aware to call the clinic if they develop any new symptoms or if symptoms persist or worsen. Patient is aware when to return to the clinic for a follow-up visit. Patient educated on when it is appropriate to go to the emergency department.    I provided 15 minutes of non-face-to-face time during this encounter. The call started at 1045. The call ended at 1100. The other time was used for coordination of care.    Monia Pouch, FNP-C [facility] [address] [place], [place] [address] [phone] 10/03/2019

## 2019-10-13 ENCOUNTER — Encounter: Payer: Self-pay | Admitting: Family Medicine

## 2019-10-13 ENCOUNTER — Ambulatory Visit (INDEPENDENT_AMBULATORY_CARE_PROVIDER_SITE_OTHER): Payer: Medicare HMO | Admitting: Family Medicine

## 2019-10-13 DIAGNOSIS — J0141 Acute recurrent pansinusitis: Secondary | ICD-10-CM

## 2019-10-13 MED ORDER — PREDNISONE 20 MG PO TABS: ORAL_TABLET | ORAL | 0 refills | Status: DC

## 2019-10-13 MED ORDER — DOXYCYCLINE HYCLATE 100 MG PO TABS: 100.0000 mg | ORAL_TABLET | Freq: Two times a day (BID) | ORAL | 0 refills | Status: AC

## 2019-10-13 NOTE — Progress Notes (Signed)
Virtual Visit via telephone Note Due to COVID-19 pandemic this visit was conducted virtually. This visit type was conducted due to national recommendations for restrictions regarding the COVID-19 Pandemic (e.g. social distancing, sheltering in place) in an effort to limit this patient's exposure and mitigate transmission in our community. All issues noted in this document were discussed and addressed.  A physical exam was not performed with this format.   I connected with Judy Evans on 10/13/2019 at [facility] by telephone and verified that I am speaking with the correct person using two identifiers. Judy Evans is currently located at home and family is currently with them during visit. The provider, Monia Pouch, FNP is located in their office at time of visit.  I discussed the limitations, risks, security and privacy concerns of performing an evaluation and management service by telephone and the availability of in person appointments. I also discussed with the patient that there may be a patient responsible charge related to this service. The patient expressed understanding and agreed to proceed.  Subjective:  Patient ID: Judy Evans, female    DOB: 07-04-1971, 49 y.o.   MRN: [id number]  Chief Complaint:  Sinus Problem   HPI: Judy Evans is a 49 y.o. female presenting on 10/13/2019 for Sinus Problem   Pt reports ongoing sinus pressure, headache, pressure in ears, postnasal drainage, and fever despite symptomatic care at home.   Sinus Problem This is a recurrent problem. The current episode started 1 to 4 weeks ago. The problem has been gradually worsening since onset. The maximum temperature recorded prior to her arrival was 101 - 101.9 F. Her pain is at a severity of 6/10. The pain is moderate. Associated symptoms include chills, congestion, ear pain, headaches, sinus pressure, a sore throat and swollen glands. Pertinent negatives include no coughing, diaphoresis,  hoarse voice, neck pain, shortness of breath or sneezing. Past treatments include acetaminophen, oral decongestants, saline sprays and spray decongestants. The treatment provided no relief.     Relevant past medical, surgical, family, and social history reviewed and updated as indicated.  Allergies and medications reviewed and updated.   Past Medical History:  Diagnosis Date  . Anxiety   . Aphasia   . Arthritis   . COPD (chronic obstructive pulmonary disease) ([place])   . DDD (degenerative disc disease), cervical   . DDD (degenerative disc disease), lumbar   . Degenerative disc disease at L5-S1 level   . Diabetes mellitus without complication ([place])    States she does not have  . Fibromyalgia   . GERD (gastroesophageal reflux disease)   . H. pylori infection   . Hyperlipidemia    States she does not have  . Hypertension    States she does not have  . IBS (irritable bowel syndrome)   . LLQ pain 06/18/2015  . Migraines   . [place] 01/22/2015  . Pain with urination 02/19/2015  . Psoriasis   . Scoliosis   . Spondylolysis     Past Surgical History:  Procedure Laterality Date  . ABDOMINAL HYSTERECTOMY    . BIOPSY  09/14/2016   Procedure: BIOPSY;  Surgeon: Daneil Dolin, MD;  Location: [facility];  Service: Endoscopy;;  gastric   . CARDIAC CATHETERIZATION  2015   patient reported it to be normal. "I was dying in pain during procedure".   . CARPAL TUNNEL RELEASE Right   . CESAREAN SECTION    . CHOLECYSTECTOMY    . COLONOSCOPY  06/2014  Marc DeMason: normal  . DILATION AND CURETTAGE OF UTERUS    . ESOPHAGOGASTRODUODENOSCOPY  07/2014   Burke Keels: Normal  . ESOPHAGOGASTRODUODENOSCOPY (EGD) WITH PROPOFOL N/A 09/14/2016   Dr. Gala Romney: Normal esophagus status post empiric dilation, erosive gastropathy with biopsies showing chronic inactive gastritis, no H pylori.  Marland Kitchen FOOT SURGERY Bilateral    removal of heel spurs  . MALONEY DILATION  09/14/2016   Procedure: Venia Minks DILATION;   Surgeon: Daneil Dolin, MD;  Location: [facility];  Service: Endoscopy;;  . rotator cuff surgery Right   . TONSILLECTOMY      Social History   Socioeconomic History  . Marital status: Legally Separated    Spouse name: Not on file  . Number of children: 2  . Years of education: GED  . Highest education level: GED or equivalent  Occupational History  . Occupation: Disabled  Tobacco Use  . Smoking status: Current Every Day Smoker    Packs/day: 1.50    Years: 30.00    Pack years: 45.00    Types: Cigarettes  . Smokeless tobacco: Never Used  Substance and Sexual Activity  . Alcohol use: No  . Drug use: No  . Sexual activity: Not Currently    Birth control/protection: Surgical    Comment: hyst  Other Topics Concern  . Not on file  Social History Narrative   Caffeine use: Drinks coffee (2 cups per day), tea/soda- 2-3 cups per day   Right handed   Lives alone   Social Determinants of Health   Financial Resource Strain:   . Difficulty of Paying Living Expenses: Not on file  Food Insecurity:   . Worried About Charity fundraiser in the Last Year: Not on file  . Ran Out of Food in the Last Year: Not on file  Transportation Needs:   . Lack of Transportation (Medical): Not on file  . Lack of Transportation (Non-Medical): Not on file  Physical Activity:   . Days of Exercise per Week: Not on file  . Minutes of Exercise per Session: Not on file  Stress:   . Feeling of Stress : Not on file  Social Connections:   . Frequency of Communication with Friends and Family: Not on file  . Frequency of Social Gatherings with Friends and Family: Not on file  . Attends Religious Services: Not on file  . Active Member of Clubs or Organizations: Not on file  . Attends Archivist Meetings: Not on file  . Marital Status: Not on file  Intimate Partner Violence:   . Fear of Current or Ex-Partner: Not on file  . Emotionally Abused: Not on file  . Physically Abused: Not on file    . Sexually Abused: Not on file    Outpatient Encounter Medications as of 10/13/2019  Medication Sig  . acetaminophen (TYLENOL) 500 MG tablet Take 1,000 mg by mouth every 6 (six) hours as needed for mild pain.  Marland Kitchen albuterol (VENTOLIN HFA) 108 (90 Base) MCG/ACT inhaler INHALE 2 PUFFS INTO THE LUNGS EVERY 4 (FOUR) HOURS AS NEEDED FOR WHEEZING OR SHORTNESS OF BREATH.(Needs to be seen before next refill)  . amitriptyline (ELAVIL) 25 MG tablet TAKE 1 OR 2 TABLETS BY MOUTH AT BEDTIME  . BREO ELLIPTA 100-25 MCG/INH AEPB TAKE 1 PUFF BY MOUTH EVERY DAY  . cetirizine (ZYRTEC) 10 MG tablet TAKE 1 TABLET (10 MG TOTAL) BY MOUTH DAILY.  Marland Kitchen COSENTYX SENSOREADY, 300 MG, 150 MG/ML SOAJ   . doxycycline (VIBRA-TABS) 100  MG tablet Take 1 tablet (100 mg total) by mouth 2 (two) times daily for 10 days. 1 po bid  . fluconazole (DIFLUCAN) 150 MG tablet Take 1 tablet (150 mg total) by mouth every three (3) days as needed.  . fluticasone (FLONASE) 50 MCG/ACT nasal spray SPRAY 2 SPRAYS INTO EACH NOSTRIL EVERY DAY  . gabapentin (NEURONTIN) 800 MG tablet TAKE 1 TABLET (800 MG TOTAL) BY MOUTH 4 (FOUR) TIMES DAILY.  . Galcanezumab-gnlm (EMGALITY) 120 MG/ML SOSY Inject 1 mL into the skin every 28 (twenty-eight) days.  Marland Kitchen guaiFENesin (MUCINEX) 600 MG 12 hr tablet Take 1 tablet (600 mg total) by mouth 2 (two) times daily.  Marland Kitchen ibuprofen (ADVIL,MOTRIN) 600 MG tablet Take 1 tablet (600 mg total) by mouth every 8 (eight) hours as needed.  . lidocaine (LIDODERM) 5 % PLACE 1 PATCH ONTO THE SKIN DAILY. REMOVE & DISCARD PATCH WITHIN 12 HOURS OR AS DIRECTED BY MD  . lubiprostone (AMITIZA) 24 MCG capsule (Needs to be seen before next refill)TAKE 1 CAPSULE (24 MCG TOTAL) BY MOUTH 2 (TWO) TIMES DAILY WITH A MEAL.  Marland Kitchen moxifloxacin (AVELOX) 400 MG tablet Take 1 tablet (400 mg total) by mouth daily. Take all of these, for infection  . nystatin (MYCOSTATIN) 100000 UNIT/ML suspension Take 1 teaspoon, swish, gargle for 1 minute and then spit up 4  times a day until symptoms improve.  . nystatin ointment (MYCOSTATIN) APPLY 1 APPLICATION TOPICALLY 2 (TWO) TIMES DAILY AS NEEDED.  Marland Kitchen omeprazole (PRILOSEC) 40 MG capsule TAKE 1 CAPSULE BY MOUTH EVERY DAY  . predniSONE (DELTASONE) 20 MG tablet 2 po at sametime daily for 5 days  . SUMAtriptan (IMITREX) 100 MG tablet Take 1 tablet (100 mg total) by mouth once as needed for up to 1 dose for migraine. May repeat in 2 hours if headache persists or recurs.  Marland Kitchen tiZANidine (ZANAFLEX) 4 MG tablet TAKE 1 TO 2 TABLETS BY MOUTH NIGHTLY  . triamcinolone cream (KENALOG) 0.1 %   . triamcinolone lotion (KENALOG) 0.1 % Apply 1 application topically 2 (two) times daily.  . [DISCONTINUED] fluticasone furoate-vilanterol (BREO ELLIPTA) 100-25 MCG/INH AEPB Inhale 1 puff into the lungs daily.  . [DISCONTINUED] lidocaine (LIDODERM) 5 % Place 1 patch onto the skin daily. Remove & Discard patch within 12 hours or as directed by MD  . [DISCONTINUED] predniSONE (DELTASONE) 20 MG tablet 2 po at sametime daily for 5 days   No facility-administered encounter medications on file as of 10/13/2019.    Allergies  Allergen Reactions  . Rosuvastatin Calcium Swelling    Ends up in the hospital  . Statins Swelling    Ends up in the hospital  . Wellbutrin [Bupropion]     suicidal  . Hctz [Hydrochlorothiazide] Nausea And Vomiting and Rash  . Hydrocodone-Acetaminophen Rash  . Oxycodone Rash  . Penicillins Nausea And Vomiting and Rash    Has patient had a PCN reaction causing immediate rash, facial/tongue/throat swelling, SOB or lightheadedness with hypotension: Yes Has patient had a PCN reaction causing severe rash involving mucus membranes or skin necrosis: No Has patient had a PCN reaction that required hospitalization No Has patient had a PCN reaction occurring within the last 10 years: No If all of the above answers are "NO", then may proceed with Cephalosporin use.     Review of Systems  Constitutional: Positive for  chills. Negative for activity change, appetite change, diaphoresis, fatigue, fever and unexpected weight change.  HENT: Positive for congestion, ear pain, postnasal drip, sinus pressure,  sinus pain and sore throat. Negative for hoarse voice, rhinorrhea and sneezing.   Eyes: Negative.  Negative for photophobia and visual disturbance.  Respiratory: Negative for cough, chest tightness and shortness of breath.   Cardiovascular: Negative for chest pain, palpitations and leg swelling.  Gastrointestinal: Negative for abdominal pain, blood in stool, constipation, diarrhea, nausea and vomiting.  Endocrine: Negative.   Genitourinary: Negative for decreased urine volume, difficulty urinating, dysuria, frequency and urgency.  Musculoskeletal: Negative for arthralgias, myalgias and neck pain.  Skin: Negative.  Negative for color change and rash.  Allergic/Immunologic: Negative.   Neurological: Positive for headaches. Negative for dizziness, tremors, seizures, syncope, facial asymmetry, speech difficulty, weakness, light-headedness and numbness.  Hematological: Negative.   Psychiatric/Behavioral: Negative for confusion, hallucinations, sleep disturbance and suicidal ideas.  All other systems reviewed and are negative.        Observations/Objective: No vital signs or physical exam, this was a telephone or virtual health encounter.  Pt alert and oriented, answers all questions appropriately, and able to speak in full sentences.    Assessment and Plan: Judy Evans was seen today for sinus problem.  Diagnoses and all orders for this visit:  Acute recurrent pansinusitis Reported symptoms consistent with sinusitis. Pt has failed symptomatic care at home for 10 days. Will initiate below. Pt aware to continue symptomatic measures at home and report any new, worsening, or persistent symptoms. Medications as prescribed. Follow up as needed.  -     doxycycline (VIBRA-TABS) 100 MG tablet; Take 1 tablet (100 mg  total) by mouth 2 (two) times daily for 10 days. 1 po bid -     predniSONE (DELTASONE) 20 MG tablet; 2 po at sametime daily for 5 days     Follow Up Instructions: Return if symptoms worsen or fail to improve.    I discussed the assessment and treatment plan with the patient. The patient was provided an opportunity to ask questions and all were answered. The patient agreed with the plan and demonstrated an understanding of the instructions.   The patient was advised to call back or seek an in-person evaluation if the symptoms worsen or if the condition fails to improve as anticipated.  The above assessment and management plan was discussed with the patient. The patient verbalized understanding of and has agreed to the management plan. Patient is aware to call the clinic if they develop any new symptoms or if symptoms persist or worsen. Patient is aware when to return to the clinic for a follow-up visit. Patient educated on when it is appropriate to go to the emergency department.    I provided 15 minutes of non-face-to-face time during this encounter. The call started at [place]. The call ended at 61. The other time was used for coordination of care.    Monia Pouch, FNP-C [facility] [address] [place], [place] [address] [phone] 10/13/2019

## 2019-10-14 ENCOUNTER — Telehealth: Payer: Self-pay | Admitting: *Deleted

## 2019-10-14 NOTE — Telephone Encounter (Addendum)
Prior Auth for Lidocaine Patches-APPROVED till 09/24/20  Key: [id number] -   PA Case ID: [id number]   Pharmacy notified   Your information has been submitted to [place] [employer] Part D. [employer] will review the request and will issue a decision, typically within 1-3 days from your submission. You can check the updated outcome later by reopening this request.  If [employer] has not responded in 1-3 days or if you have any questions about your ePA request, please contact [place] [employer] Part D at [phone]. If you think there may be a problem with your PA request, use our live chat feature at the bottom right.

## 2019-11-06 ENCOUNTER — Other Ambulatory Visit: Payer: Self-pay | Admitting: Neurology

## 2019-11-06 ENCOUNTER — Other Ambulatory Visit: Payer: Self-pay | Admitting: Family Medicine

## 2019-11-06 DIAGNOSIS — L409 Psoriasis, unspecified: Secondary | ICD-10-CM | POA: Diagnosis not present

## 2019-11-06 DIAGNOSIS — J449 Chronic obstructive pulmonary disease, unspecified: Secondary | ICD-10-CM

## 2019-11-06 DIAGNOSIS — Z85820 Personal history of malignant melanoma of skin: Secondary | ICD-10-CM | POA: Diagnosis not present

## 2019-11-06 DIAGNOSIS — Z79899 Other long term (current) drug therapy: Secondary | ICD-10-CM | POA: Diagnosis not present

## 2019-11-07 ENCOUNTER — Other Ambulatory Visit: Payer: Self-pay

## 2019-11-07 ENCOUNTER — Ambulatory Visit: Payer: Medicare HMO | Admitting: Family Medicine

## 2019-11-07 ENCOUNTER — Encounter: Payer: Self-pay | Admitting: Podiatry

## 2019-11-07 ENCOUNTER — Ambulatory Visit (INDEPENDENT_AMBULATORY_CARE_PROVIDER_SITE_OTHER): Payer: Medicare HMO | Admitting: Podiatry

## 2019-11-07 DIAGNOSIS — M79675 Pain in left toe(s): Secondary | ICD-10-CM | POA: Diagnosis not present

## 2019-11-07 DIAGNOSIS — M79674 Pain in right toe(s): Secondary | ICD-10-CM

## 2019-11-07 DIAGNOSIS — B351 Tinea unguium: Secondary | ICD-10-CM | POA: Diagnosis not present

## 2019-11-07 NOTE — Patient Instructions (Signed)

## 2019-11-09 NOTE — Progress Notes (Signed)
Subjective: Judy Evans presents today for follow up of painful mycotic nails b/l that are difficult to trim. Pain interferes with ambulation. Aggravating factors include wearing enclosed shoe gear. Pain is relieved with periodic professional debridement.   She relates she still has not had another A1c performed due to having other pressing health issues. She continues using gabapentin for her neuropathic pain.   Allergies  Allergen Reactions  . Rosuvastatin Calcium Swelling    Ends up in the hospital  . Statins Swelling    Ends up in the hospital  . Wellbutrin [Bupropion]     suicidal  . Hctz [Hydrochlorothiazide] Nausea And Vomiting and Rash  . Hydrocodone-Acetaminophen Rash  . Oxycodone Rash  . Penicillins Nausea And Vomiting and Rash    Has patient had a PCN reaction causing immediate rash, facial/tongue/throat swelling, SOB or lightheadedness with hypotension: Yes Has patient had a PCN reaction causing severe rash involving mucus membranes or skin necrosis: No Has patient had a PCN reaction that required hospitalization No Has patient had a PCN reaction occurring within the last 10 years: No If all of the above answers are "NO", then may proceed with Cephalosporin use.     Objective: There were no vitals filed for this visit.  Vascular Examination:  Capillary refill time to digits immediate b/l, palpable DP pulses b/l, palpable PT pulses b/l, pedal hair present b/l and skin temperature gradient within normal limits b/l  Dermatological Examination: Pedal skin with normal turgor, texture and tone bilaterally, no open wounds bilaterally, no interdigital macerations bilaterally and toenails 1-5 b/l elongated, dystrophic, thickened, crumbly with subungual debris  Musculoskeletal: Normal muscle strength 5/5 to all lower extremity muscle groups bilaterally, no gross bony deformities bilaterally and no pain crepitus or joint limitation noted with ROM  b/l  Neurological: Protective sensation intact 5/5 intact bilaterally with 10g monofilament b/l, vibratory sensation intact b/l and proprioception intact bilaterally  Assessment: 1. Pain due to onychomycosis of toenails of both feet    Plan: -Toenails 1-5 b/l were debrided in length and girth without iatrogenic bleeding. -Patient to continue soft, supportive shoe gear daily. -Patient to report any pedal injuries to medical professional immediately. -Patient/POA to call should there be question/concern in the interim.  Return in about 3 months (around 02/04/2020) for nail trim.

## 2019-11-11 ENCOUNTER — Ambulatory Visit (INDEPENDENT_AMBULATORY_CARE_PROVIDER_SITE_OTHER): Payer: Medicare HMO

## 2019-11-11 DIAGNOSIS — Z0000 Encounter for general adult medical examination without abnormal findings: Secondary | ICD-10-CM | POA: Diagnosis not present

## 2019-11-11 NOTE — Progress Notes (Signed)
MEDICARE ANNUAL WELLNESS VISIT  11/11/2019  Telephone Visit Disclaimer This Medicare AWV was conducted by telephone due to national recommendations for restrictions regarding the COVID-19 Pandemic (e.g. social distancing).  I verified, using two identifiers, that I am speaking with Judy Evans or their authorized healthcare agent. I discussed the limitations, risks, security, and privacy concerns of performing an evaluation and management service by telephone and the potential availability of an in-person appointment in the future. The patient expressed understanding and agreed to proceed.   Subjective:  Judy Evans is a 49 y.o. female patient of [facility], Connye Burkitt, [place] who had a Medicare Annual Wellness Visit today via telephone. Judy Evans is Disabled and lives alone. she has 2 children. she reports that she is socially active and does interact with friends/family regularly. she is minimally physically active.  Patient Care Team: Baruch Gouty, FNP as PCP - General (Family Medicine) Gala Romney, Cristopher Estimable, MD as Consulting Physician (Gastroenterology) Sandford Craze, MD as Referring Physician (Dermatology) Baruch Gouty, FNP as Nurse Practitioner (Family Medicine)  Advanced Directives 11/11/2019 01/02/2018 09/14/2016 09/11/2016 07/24/2016  Does Patient Have a Medical Advance Directive? No No No No No  Would patient like information on creating a medical advance directive? No - Patient declined No - Patient declined No - Patient declined No - Patient declined -    Hospital Utilization Over the Past 12 Months: # of hospitalizations or ER visits: 0 # of surgeries: 0  Review of Systems    Patient reports that her overall health is unchanged compared to last year.  Pt continues to have Sinus troubles.  Patient Reported Readings (BP, Pulse, CBG, Weight, etc) none  Pain Assessment       Current Medications & Allergies (verified) Allergies as of 11/11/2019      Reactions     Rosuvastatin Calcium Swelling   Ends up in the hospital   Statins Swelling   Ends up in the hospital   Wellbutrin [bupropion]    suicidal   Hctz [hydrochlorothiazide] Nausea And Vomiting, Rash   Hydrocodone-acetaminophen Rash   Oxycodone Rash   Penicillins Nausea And Vomiting, Rash   Has patient had a PCN reaction causing immediate rash, facial/tongue/throat swelling, SOB or lightheadedness with hypotension: Yes Has patient had a PCN reaction causing severe rash involving mucus membranes or skin necrosis: No Has patient had a PCN reaction that required hospitalization No Has patient had a PCN reaction occurring within the last 10 years: No If all of the above answers are "NO", then may proceed with Cephalosporin use.      Medication List       Accurate as of November 11, 2019  2:51 PM. If you have any questions, ask your nurse or doctor.        STOP taking these medications   Adalimumab 80 MG/0.8ML & 40MG /0.4ML Pskt   Emgality 120 MG/ML Sosy Generic drug: Galcanezumab-gnlm   Fish Oil 1000 MG Caps   lubiprostone 24 MCG capsule Commonly known as: Amitiza   moxifloxacin 400 MG tablet Commonly known as: Avelox   predniSONE 20 MG tablet Commonly known as: Deltasone     TAKE these medications   acetaminophen 500 MG tablet Commonly known as: TYLENOL Take 1,000 mg by mouth every 6 (six) hours as needed for mild pain.   albuterol 108 (90 Base) MCG/ACT inhaler Commonly known as: VENTOLIN HFA USE 2 PUFFS EVERY 4 HOURS AS NEEDED FOR WHEEZING OR SHORTNESS OF BREATH (Needs to be seen  before next refill)   amitriptyline 25 MG tablet Commonly known as: ELAVIL TAKE 1 TO 2 TABLETS BY MOUTH AT BEDTIME   Breo Ellipta 100-25 MCG/INH Aepb Generic drug: fluticasone furoate-vilanterol TAKE 1 PUFF BY MOUTH EVERY DAY   cetirizine 10 MG tablet Commonly known as: ZYRTEC TAKE 1 TABLET (10 MG TOTAL) BY MOUTH DAILY.   Cosentyx Sensoready (300 MG) 150 MG/ML Soaj Generic drug:  Secukinumab (300 MG Dose)   fluconazole 150 MG tablet Commonly known as: DIFLUCAN Take 1 tablet (150 mg total) by mouth every three (3) days as needed.   fluticasone 50 MCG/ACT nasal spray Commonly known as: FLONASE SPRAY 2 SPRAYS INTO EACH NOSTRIL EVERY DAY   gabapentin 800 MG tablet Commonly known as: NEURONTIN TAKE 1 TABLET (800 MG TOTAL) BY MOUTH 4 (FOUR) TIMES DAILY.   ibuprofen 600 MG tablet Commonly known as: ADVIL Take 1 tablet (600 mg total) by mouth every 8 (eight) hours as needed.   lidocaine 5 % Commonly known as: LIDODERM PLACE 1 PATCH ONTO THE SKIN DAILY. REMOVE & DISCARD PATCH WITHIN 12 HOURS OR AS DIRECTED BY MD   nystatin 100000 UNIT/ML suspension Commonly known as: MYCOSTATIN Take 1 teaspoon, swish, gargle for 1 minute and then spit up 4 times a day until symptoms improve.   nystatin ointment Commonly known as: MYCOSTATIN APPLY 1 APPLICATION TOPICALLY 2 (TWO) TIMES DAILY AS NEEDED.   omeprazole 40 MG capsule Commonly known as: PRILOSEC TAKE 1 CAPSULE BY MOUTH EVERY DAY   SUMAtriptan 100 MG tablet Commonly known as: IMITREX Take 1 tablet (100 mg total) by mouth once as needed for up to 1 dose for migraine. May repeat in 2 hours if headache persists or recurs.   tiZANidine 4 MG tablet Commonly known as: ZANAFLEX TAKE 1 TO 2 TABLETS BY MOUTH NIGHTLY   triamcinolone cream 0.1 % Commonly known as: KENALOG What changed: Another medication with the same name was removed. Continue taking this medication, and follow the directions you see here.       History (reviewed): Past Medical History:  Diagnosis Date  . Anxiety   . Aphasia   . Arthritis   . COPD (chronic obstructive pulmonary disease) ([place])   . DDD (degenerative disc disease), cervical   . DDD (degenerative disc disease), lumbar   . Degenerative disc disease at L5-S1 level   . Diabetes mellitus without complication ([place])    States she does not have  . Fibromyalgia   . GERD  (gastroesophageal reflux disease)   . H. pylori infection   . Hyperlipidemia    States she does not have  . Hypertension    States she does not have  . IBS (irritable bowel syndrome)   . LLQ pain 06/18/2015  . Migraines   . Moody 01/22/2015  . Pain with urination 02/19/2015  . Psoriasis   . Scoliosis   . Spondylolysis    Past Surgical History:  Procedure Laterality Date  . ABDOMINAL HYSTERECTOMY    . BIOPSY  09/14/2016   Procedure: BIOPSY;  Surgeon: Daneil Dolin, MD;  Location: [facility];  Service: Endoscopy;;  gastric   . CARDIAC CATHETERIZATION  2015   patient reported it to be normal. "I was dying in pain during procedure".   . CARPAL TUNNEL RELEASE Right   . CESAREAN SECTION    . CHOLECYSTECTOMY    . COLONOSCOPY  06/2014   Altamese Dilling DeMason: normal  . DILATION AND CURETTAGE OF UTERUS    . ESOPHAGOGASTRODUODENOSCOPY  07/2014   Burke Keels: Normal  . ESOPHAGOGASTRODUODENOSCOPY (EGD) WITH PROPOFOL N/A 09/14/2016   Dr. Gala Romney: Normal esophagus status post empiric dilation, erosive gastropathy with biopsies showing chronic inactive gastritis, no H pylori.  Marland Kitchen FOOT SURGERY Bilateral    removal of heel spurs  . Evans DILATION  09/14/2016   Procedure: Venia Minks DILATION;  Surgeon: Daneil Dolin, MD;  Location: [facility];  Service: Endoscopy;;  . rotator cuff surgery Right   . TONSILLECTOMY     Family History  Problem Relation Age of Onset  . COPD Mother   . Diabetes Mother   . Osteoarthritis Mother   . Atrial fibrillation Mother   . Heart disease Mother   . Healthy Father   . Lupus Sister   . CAD Sister   . Diabetes Sister   . Early death Brother        pneumonia  . Asthma Daughter   . Asthma Son   . Cancer Maternal Grandmother 53       colon  . Arthritis Maternal Grandmother   . Diabetes Maternal Grandmother   . Dementia Maternal Grandmother   . Other Maternal Grandfather        brain tumor  . Cancer Paternal Grandmother        breast  . Cancer Paternal  Grandfather        lung  . Other Paternal Grandfather        brain aneurysm  . Factor V Leiden deficiency Sister   . Arthritis Sister   . Psoriasis Sister    Social History   Socioeconomic History  . Marital status: Legally Separated    Spouse name: Not on file  . Number of children: 2  . Years of education: GED  . Highest education level: GED or equivalent  Occupational History  . Occupation: Disabled  Tobacco Use  . Smoking status: Current Every Day Smoker    Packs/day: 1.50    Years: 30.00    Pack years: 45.00    Types: Cigarettes  . Smokeless tobacco: Never Used  Substance and Sexual Activity  . Alcohol use: No  . Drug use: No  . Sexual activity: Not Currently    Birth control/protection: Surgical    Comment: hyst  Other Topics Concern  . Not on file  Social History Narrative   Caffeine use: Drinks coffee (2 cups per day), tea/soda- 2-3 cups per day   Right handed   Lives alone   Social Determinants of Health   Financial Resource Strain:   . Difficulty of Paying Living Expenses: Not on file  Food Insecurity:   . Worried About Charity fundraiser in the Last Year: Not on file  . Ran Out of Food in the Last Year: Not on file  Transportation Needs:   . Lack of Transportation (Medical): Not on file  . Lack of Transportation (Non-Medical): Not on file  Physical Activity:   . Days of Exercise per Week: Not on file  . Minutes of Exercise per Session: Not on file  Stress:   . Feeling of Stress : Not on file  Social Connections:   . Frequency of Communication with Friends and Family: Not on file  . Frequency of Social Gatherings with Friends and Family: Not on file  . Attends Religious Services: Not on file  . Active Member of Clubs or Organizations: Not on file  . Attends Archivist Meetings: Not on file  . Marital Status: Not on file  Activities of Daily Living In your present state of health, do you have any difficulty performing the following  activities: 11/11/2019  Hearing? N  Vision? N  Difficulty concentrating or making decisions? N  Walking or climbing stairs? Y  Comment Arthritis bilateral knees  Dressing or bathing? N  Doing errands, shopping? Y  Comment Health, feeling bad, fibromyalgia. "A task to do anything"  Preparing Food and eating ? Y  Using the Toilet? N  In the past six months, have you accidently leaked urine? N  Do you have problems with loss of bowel control? N  Managing your Medications? N  Managing your Finances? N  Housekeeping or managing your Housekeeping? Y  Comment Gets done little by little  Some recent data might be hidden    Patient Education/ Literacy    Exercise Current Exercise Habits: The patient does not participate in regular exercise at present, Exercise limited by: neurologic condition(s)  Diet Patient reports consuming 1 meals a day and 1 snack(s) a day Patient reports that her primary diet is: Low fat Patient reports that she does have regular access to food.   Depression Screen PHQ 2/9 Scores 11/11/2019 10/02/2018 09/12/2018 06/20/2018 03/22/2018 01/02/2018 12/20/2017  PHQ - 2 Score 0 0 0 0 0 0 0  PHQ- 9 Score - - - - - 6 -     Fall Risk Fall Risk  11/11/2019 10/02/2018 09/12/2018 06/20/2018 03/22/2018  Falls in the past year? 0 0 0 No No  Number falls in past yr: 0 - - - -  Injury with Fall? 0 - - - -     Objective:  Judy Evans seemed alert and oriented and she participated appropriately during our telephone visit.  Blood Pressure Weight BMI  BP Readings from Last 3 Encounters:  05/20/19 122/72  11/25/18 121/67  11/19/18 118/78   Wt Readings from Last 3 Encounters:  05/20/19 218 lb (98.9 kg)  11/19/18 221 lb 8 oz (100.5 kg)  10/02/18 223 lb (101.2 kg)   BMI Readings from Last 1 Encounters:  05/20/19 39.87 kg/m    *Unable to obtain current vital signs, weight, and BMI due to telephone visit type  Hearing/Vision  . Judy Evans did not seem to have difficulty  with hearing/understanding during the telephone conversation . Reports that she has not had a formal eye exam by an eye care professional within the past year . Reports that she has not had a formal hearing evaluation within the past year *Unable to fully assess hearing and vision during telephone visit type  Cognitive Function: 6CIT Screen 11/11/2019  What Year? 0 points  What month? 0 points  What time? 0 points  Count back from 20 0 points  Months in reverse 0 points  Repeat phrase 0 points  Total Score 0   (Normal:0-7, Significant for Dysfunction: >8)  Normal Cognitive Function Screening: Yes   Immunization & Health Maintenance Record Immunization History  Administered Date(s) Administered  . Influenza,inj,Quad PF,6+ Mos 08/24/2017, 06/20/2018  . Influenza-Unspecified 06/10/2013, 11/01/2015  . PPD Test 03/22/2018  . Pneumococcal Polysaccharide-23 08/24/2017  . Td 06/10/2004  . Tdap 06/14/2017    Health Maintenance  Topic Date Due  . INFLUENZA VACCINE  12/24/2019 (Originally 04/26/2019)  . HIV Screening  11/10/2020 (Originally [birth year]/1987)  . TETANUS/TDAP  06/15/2027       Assessment  This is a routine wellness examination for Judy Evans.  Health Maintenance: Due or Overdue There are no preventive care reminders to display for  this patient.  Judy Evans does not need a referral for Community Assistance: Care Management:   no Social Work:    no Prescription Assistance:  no Nutrition/Diabetes Education:  no   Plan:  Personalized Goals  Eating more fruits and vegtables  Exercising  Personalized Health Maintenance & Screening Recommendations  Influenza vaccine. Pt has not received this at our clinic this year due to her upper respiratory symptoms. Suggested to patient if she wished to have injection to call her pharmacy.  Lung Cancer Screening Recommended: no (Low Dose CT Chest recommended if Age 52-80 years, 30 pack-year currently smoking OR  have quit w/in past 15 years) Hepatitis C Screening recommended: no HIV Screening recommended: no  Advanced Directives: Written information was not prepared per patient's request.  Referrals & Orders No orders of the defined types were placed in this encounter.   Follow-up Plan . Follow-up with Baruch Gouty, FNP as planned  . Scheduled 11/11/2020 AWV   I have personally reviewed and noted the following in the patient's chart:   . Medical and social history . Use of alcohol, tobacco or illicit drugs  . Current medications and supplements . Functional ability and status . Nutritional status . Physical activity . Advanced directives . List of other physicians . Hospitalizations, surgeries, and ER visits in previous 12 months . Vitals . Screenings to include cognitive, depression, and falls . Referrals and appointments  In addition, I have reviewed and discussed with Judy Evans certain preventive protocols, quality metrics, and best practice recommendations. A written personalized care plan for preventive services as well as general preventive health recommendations is available and can be mailed to the patient at her request.      Alphonzo Dublin, LPN  [id number]

## 2019-11-20 ENCOUNTER — Ambulatory Visit (INDEPENDENT_AMBULATORY_CARE_PROVIDER_SITE_OTHER): Payer: Medicare HMO | Admitting: Neurology

## 2019-11-20 ENCOUNTER — Encounter: Payer: Self-pay | Admitting: Neurology

## 2019-11-20 ENCOUNTER — Other Ambulatory Visit: Payer: Self-pay

## 2019-11-20 VITALS — BP 128/80 | HR 89 | Temp 97.9°F | Ht 62.0 in | Wt 207.5 lb

## 2019-11-20 DIAGNOSIS — M5481 Occipital neuralgia: Secondary | ICD-10-CM

## 2019-11-20 DIAGNOSIS — L409 Psoriasis, unspecified: Secondary | ICD-10-CM

## 2019-11-20 DIAGNOSIS — G43709 Chronic migraine without aura, not intractable, without status migrainosus: Secondary | ICD-10-CM | POA: Diagnosis not present

## 2019-11-20 DIAGNOSIS — R200 Anesthesia of skin: Secondary | ICD-10-CM | POA: Diagnosis not present

## 2019-11-20 DIAGNOSIS — M542 Cervicalgia: Secondary | ICD-10-CM

## 2019-11-20 DIAGNOSIS — IMO0002 Reserved for concepts with insufficient information to code with codable children: Secondary | ICD-10-CM

## 2019-11-20 MED ORDER — AJOVY 225 MG/1.5ML ~~LOC~~ SOSY: 1.0000 | PREFILLED_SYRINGE | SUBCUTANEOUS | 3 refills | Status: DC

## 2019-11-20 NOTE — Progress Notes (Signed)
[facility]  PATIENT: Judy Evans DOB: 03/14/1971  REFERRING DOCTOR OR PCP:  Dr. Evette Doffing SOURCE: Patient, notes from Dr. Evette Doffing imaging reports, MRI images on PACS.  _________________________________   HISTORICAL  CHIEF COMPLAINT:  Chief Complaint  Patient presents with  . Follow-up    RM 13, alone. Last seen 05/20/2019. Thinks there may be something wrong with her thyroid. Has not seen PCP yet.   . Migraine    Takes emgality, gabapentin, amitriptyline. In the last month, migraines have gotten worse.     HISTORY OF PRESENT ILLNESS:  Judy Evans is a 49 y.o. woman with headaches, shoulder pain and neck pain.  Update 11/20/2019: She is currently on gabapentin and amitriptyline.   She was prescribed Emgality she never received any.   Splenius capitus/occipitla nerve injections have helped but only 3 months.      She has had headaches 30/30 days a month over the past few months.  She has nausea, photophobia, phonophobia.  Moving worsens the pain.    She feels tired when headache is worse.   Right side is worse than left.   Tylenol arthritis helps a little bit but then pain intensifies again.    MRi in 2019 showed multilevel DJD but no definite nerve root compression.     Update 05/20/2019: She feels she is doing a little better with fewer headache days..   She is still having 3 severe headaches a month with nausea and photophobia.  These can be intense.   However, she continues to have 15 days or so of milder headaches a month..  The intensity is not as bad as it was at the last visit.  Milder headaches are often occipital.  She takes amitriptyline 50 mg nightly and gabapentin 800 mg po tid.   these medications have only mildly helped.  Topiramate made her feel sick.   She got some benefit from trigger point injections performed at the last visit that lasted a couple months.  She feels her weight is stable.     Update 11/19/2018: After the last  visit when we do a splenius capitis/occipital nerve block headaches improved.  Over the last 2 to 3 weeks the headaches have returned.  She has a pressure-like sensation in the head and neck.    Pain starts in the neck and radiates up.   Her neck feels stiff.   Pain is worse with noises and bright lights and best with rest.    Additionally she is having nausea.  Tylenol slightly takes the edge off for a few hours.   She is on amitriptyline.      Update 07/18/2018: She reports a lot of headache in the back of the head on the left, rarely on both sides.   Pain is aching in quality.   She has nausea and some vomiting   Bending over and noise and movements worsens the pain.    Nothing makes it better.    She denies specific arm weakness but notes general arm > leg weakness.   Bladder is doing well.  Gait is fine.    She sees Dr. Francesco Runner for Pain management and had a recent cervical ESI --- this helped pain a short time.   She also notes some muscle aches and has been diagnosed with FMS.   She has some sleep maintenance insomnia.     NCV/EMG 04/23/18 Impression: This NCV/EMG study shows the following: 1.   Mild chronic left C7 radiculopathy without  active features 2.   No evidence of median or ulnar neuropathies.    Her maternal grandfather and maternal great grandfather had cerebral aneurysm.     Update 03/18/2018: She feels her migraines are helping a lot since the amitriptyline was titrated up.     She does not get the severe ones anymore and most of the headaches are short and mild.    At a prior visit, we did a shoulder injection and the pain was much better until a few months ago.  Pain is worse with driving and with elevating the arm.    She will note some numbness in her hand at times.     She also has left hip and buttock pain.   A trochanteric bursa injection helped her a lot in the past.      She has numbness in her arms, left > right.  This fluctuates some.   MRI shows C3C4 > C5C6 DJD with  foraminal narrowing to the left.       From 03/13/2017:  Her more constant headache and migraines are better on amitriptyline but she continues to get the sharp pain at times that is felt mostly in the right temple.    She tolerates the amitriptyline well.   HA History:   She has had headaches off and on x many years but they worsened 4-5 years ago. She had three especially bad headaches associated with difficulty speaking.   She saw a neurologist at [facility].   She was placed on amitriptyline with some benefit.   However, when she moved, her new PCP would not right amitriptyline for her.   She was referred to a pain management practice.    When they occur, she often gets sharp pains on the right.   Pain is stabbing.   She gets nausea but rarely has vomiting.   She has had no furhter migraine with aphasia.  Moving increases her pain.   Bright lights and noises increases her pain.  Amitriptyline has helped the headaches Triptans help sometimes but not always.   Tylenol arthritis helps a little bit.   .  FMS/neck pain/LBP:  She still notes the fibromyalgia pain, mostly in the back, neck and hips.     She also has left hip bursitis and at times it seems like the leg is going to give out.     She is on Cymbalta and gabapentin (800 mg po qid) with some benefit.   She is also on Dilaudid 2 mg qid with benfit.  She has had lumbar spinal injections with benefit.    She sees Dr. Francesco Runner for Pain Management [facility]) for pain management.        Shoulder pain:   The left shoulder pain did better withteh subacromial bursa injection but benefit was only about 2 weeks. Pain increases with elevation and external rotation.    I personally reviewed an MRI of the cervical spine dated 07/14/2016. At C3-C4, she has a disc osteophyte complex causing moderate left foraminal narrowing with some encroachment upon the left C4 nerve root. At C5-C6 she has mild left foraminal narrowing due to a small disc osteophyte complex.  There does not appear to be any nerve root compression.  REVIEW OF SYSTEMS: Constitutional: No fevers, chills, sweats, or change in appetite Eyes: No visual changes, double vision, eye pain Ear, nose and throat: No hearing loss, ear pain, nasal congestion, sore throat Cardiovascular: No chest pain, palpitations Respiratory: No shortness of breath at  rest or with exertion.   No wheezes GastrointestinaI: has Irritable bowel Genitourinary: No dysuria, urinary retention or frequency.  No nocturia. Musculoskeletal: as above, fibromyalgia/neck pain/ shoulder pain/back pain Integumentary: Has psoriasis Neurological: as above Psychiatric: No depression at this time.  No anxiety Endocrine: has pre-diabetesNo palpitations, diaphoresis, change in appetite, change in weigh or increased thirst Hematologic/Lymphatic: No anemia, purpura, petechiae. Allergic/Immunologic: No itchy/runny eyes, nasal congestion, recent allergic reactions, rashes  ALLERGIES: Allergies  Allergen Reactions  . Rosuvastatin Calcium Swelling    Ends up in the hospital  . Statins Swelling    Ends up in the hospital  . Wellbutrin [Bupropion]     suicidal  . Hctz [Hydrochlorothiazide] Nausea And Vomiting and Rash  . Hydrocodone-Acetaminophen Rash  . Oxycodone Rash  . Penicillins Nausea And Vomiting and Rash    Has patient had a PCN reaction causing immediate rash, facial/tongue/throat swelling, SOB or lightheadedness with hypotension: Yes Has patient had a PCN reaction causing severe rash involving mucus membranes or skin necrosis: No Has patient had a PCN reaction that required hospitalization No Has patient had a PCN reaction occurring within the last 10 years: No If all of the above answers are "NO", then may proceed with Cephalosporin use.     HOME MEDICATIONS:  Current Outpatient Medications:  .  acetaminophen (TYLENOL) 500 MG tablet, Take 1,000 mg by mouth every 6 (six) hours as needed for mild pain., Disp: ,  Rfl:  .  albuterol (VENTOLIN HFA) 108 (90 Base) MCG/ACT inhaler, USE 2 PUFFS EVERY 4 HOURS AS NEEDED FOR WHEEZING OR SHORTNESS OF BREATH (Needs to be seen before next refill), Disp: 6.7 g, Rfl: 0 .  amitriptyline (ELAVIL) 25 MG tablet, TAKE 1 TO 2 TABLETS BY MOUTH AT BEDTIME, Disp: 180 tablet, Rfl: 3 .  BREO ELLIPTA 100-25 MCG/INH AEPB, TAKE 1 PUFF BY MOUTH EVERY DAY, Disp: 60 each, Rfl: 5 .  cetirizine (ZYRTEC) 10 MG tablet, TAKE 1 TABLET (10 MG TOTAL) BY MOUTH DAILY., Disp: 30 tablet, Rfl: 8 .  COSENTYX SENSOREADY, 300 MG, 150 MG/ML SOAJ, , Disp: , Rfl:  .  fluconazole (DIFLUCAN) 150 MG tablet, Take 1 tablet (150 mg total) by mouth every three (3) days as needed., Disp: 3 tablet, Rfl: 0 .  gabapentin (NEURONTIN) 800 MG tablet, TAKE 1 TABLET (800 MG TOTAL) BY MOUTH 4 (FOUR) TIMES DAILY., Disp: 360 tablet, Rfl: 0 .  ibuprofen (ADVIL,MOTRIN) 600 MG tablet, Take 1 tablet (600 mg total) by mouth every 8 (eight) hours as needed., Disp: 15 tablet, Rfl: 0 .  lidocaine (LIDODERM) 5 %, PLACE 1 PATCH ONTO THE SKIN DAILY. REMOVE & DISCARD PATCH WITHIN 12 HOURS OR AS DIRECTED BY MD, Disp: 30 patch, Rfl: 2 .  nystatin (MYCOSTATIN) 100000 UNIT/ML suspension, Take 1 teaspoon, swish, gargle for 1 minute and then spit up 4 times a day until symptoms improve., Disp: 60 mL, Rfl: 1 .  nystatin ointment (MYCOSTATIN), APPLY 1 APPLICATION TOPICALLY 2 (TWO) TIMES DAILY AS NEEDED., Disp: 30 g, Rfl: 0 .  omeprazole (PRILOSEC) 40 MG capsule, TAKE 1 CAPSULE BY MOUTH EVERY DAY, Disp: 90 capsule, Rfl: 1 .  SUMAtriptan (IMITREX) 100 MG tablet, Take 1 tablet (100 mg total) by mouth once as needed for up to 1 dose for migraine. May repeat in 2 hours if headache persists or recurs., Disp: 10 tablet, Rfl: 2 .  tiZANidine (ZANAFLEX) 4 MG tablet, TAKE 1 TO 2 TABLETS BY MOUTH NIGHTLY, Disp: 180 tablet, Rfl: 1 .  triamcinolone cream (  KENALOG) 0.1 %, , Disp: , Rfl:  .  Fremanezumab-vfrm (AJOVY) 225 MG/1.5ML SOSY, Inject 1 Syringe into the  skin every 28 (twenty-eight) days., Disp: 4.5 mL, Rfl: 3  PAST MEDICAL HISTORY: Past Medical History:  Diagnosis Date  . Anxiety   . Aphasia   . Arthritis   . COPD (chronic obstructive pulmonary disease) ([place])   . DDD (degenerative disc disease), cervical   . DDD (degenerative disc disease), lumbar   . Degenerative disc disease at L5-S1 level   . Diabetes mellitus without complication ([place])    States she does not have  . Fibromyalgia   . GERD (gastroesophageal reflux disease)   . H. pylori infection   . Hyperlipidemia    States she does not have  . Hypertension    States she does not have  . IBS (irritable bowel syndrome)   . LLQ pain 06/18/2015  . Migraines   . Moody 01/22/2015  . Pain with urination 02/19/2015  . Psoriasis   . Scoliosis   . Spondylolysis     PAST SURGICAL HISTORY: Past Surgical History:  Procedure Laterality Date  . ABDOMINAL HYSTERECTOMY    . BIOPSY  09/14/2016   Procedure: BIOPSY;  Surgeon: Daneil Dolin, MD;  [facility]: [facility];  Service: Endoscopy;;  gastric   . CARDIAC CATHETERIZATION  2015   patient reported it to be normal. "I was dying in pain during procedure".   . CARPAL TUNNEL RELEASE Right   . CESAREAN SECTION    . CHOLECYSTECTOMY    . COLONOSCOPY  06/2014   Altamese Dilling DeMason: normal  . DILATION AND CURETTAGE OF UTERUS    . ESOPHAGOGASTRODUODENOSCOPY  07/2014   Burke Keels: Normal  . ESOPHAGOGASTRODUODENOSCOPY (EGD) WITH PROPOFOL N/A 09/14/2016   Dr. Gala Romney: Normal esophagus status post empiric dilation, erosive gastropathy with biopsies showing chronic inactive gastritis, no H pylori.  Marland Kitchen FOOT SURGERY Bilateral    removal of heel spurs  . MALONEY DILATION  09/14/2016   Procedure: Venia Minks DILATION;  Surgeon: Daneil Dolin, MD;  Location: [facility];  Service: Endoscopy;;  . rotator cuff surgery Right   . TONSILLECTOMY      FAMILY HISTORY: Family History  Problem Relation Age of Onset  . COPD Mother   . Diabetes Mother   .  Osteoarthritis Mother   . Atrial fibrillation Mother   . Heart disease Mother   . Healthy Father   . Lupus Sister   . CAD Sister   . Diabetes Sister   . Early death Brother        pneumonia  . Asthma Daughter   . Asthma Son   . Cancer Maternal Grandmother 33       colon  . Arthritis Maternal Grandmother   . Diabetes Maternal Grandmother   . Dementia Maternal Grandmother   . Other Maternal Grandfather        brain tumor  . Cancer Paternal Grandmother        breast  . Cancer Paternal Grandfather        lung  . Other Paternal Grandfather        brain aneurysm  . Factor V Leiden deficiency Sister   . Arthritis Sister   . Psoriasis Sister     SOCIAL HISTORY:  Social History   Socioeconomic History  . Marital status: Legally Separated    Spouse name: Not on file  . Number of children: 2  . Years of education: GED  . Highest education  level: GED or equivalent  Occupational History  . Occupation: Disabled  Tobacco Use  . Smoking status: Current Every Day Smoker    Packs/day: 1.50    Years: 30.00    Pack years: 45.00    Types: Cigarettes  . Smokeless tobacco: Never Used  Substance and Sexual Activity  . Alcohol use: No  . Drug use: No  . Sexual activity: Not Currently    Birth control/protection: Surgical    Comment: hyst  Other Topics Concern  . Not on file  Social History Narrative   Caffeine use: Drinks coffee (2 cups per day), tea/soda- 2-3 cups per day   Right handed   Lives alone   Social Determinants of Health   Financial Resource Strain:   . Difficulty of Paying Living Expenses: Not on file  Food Insecurity:   . Worried About Charity fundraiser in the Last Year: Not on file  . Ran Out of Food in the Last Year: Not on file  Transportation Needs:   . Lack of Transportation (Medical): Not on file  . Lack of Transportation (Non-Medical): Not on file  Physical Activity:   . Days of Exercise per Week: Not on file  . Minutes of Exercise per Session:  Not on file  Stress:   . Feeling of Stress : Not on file  Social Connections:   . Frequency of Communication with Friends and Family: Not on file  . Frequency of Social Gatherings with Friends and Family: Not on file  . Attends Religious Services: Not on file  . Active Member of Clubs or Organizations: Not on file  . Attends Archivist Meetings: Not on file  . Marital Status: Not on file  Intimate Partner Violence:   . Fear of Current or Ex-Partner: Not on file  . Emotionally Abused: Not on file  . Physically Abused: Not on file  . Sexually Abused: Not on file     PHYSICAL EXAM  Vitals:   11/20/19 1528  BP: 128/80  Pulse: 89  Temp: 97.9 F (36.6 C)  Weight: 207 lb 8 oz (94.1 kg)  Height: 5\' 2"  (1.575 m)    Body mass index is 37.95 kg/m.   General: The patient is well-developed and well-nourished and in no acute distress   Musculoskeletal: She has tenderness over the splenius capitis muscle/occipital nerves bilaterally, left greater than right.  She does have some tenderness over some of the fibromyalgia tender points of the upper chest and back.     Neurologic Exam  Mental status: The patient is alert and oriented x 3 at the time of the examination. The patient has apparent normal recent and remote memory, with an apparently normal attention span and concentration ability.   Speech is normal.  Cranial nerves: Extraocular muscles are intact.  Facial strength and sensation is normal.  Trapezius strength is normal.  The tongue is midline, and the patient has symmetric elevation of the soft palate. No obvious hearing deficits are noted.  Motor:  Muscle bulk is normal.   Muscle tone is normal. Strength is 5/5 in the arms or legs..   Sensory: Sensory testing is intact to pinprick, soft touch and vibration sensation in all 4 extremities.  Coordination: Cerebellar testing reveals good finger-nose-finger bilaterally.  Gait and station: Station is normal.   Gait is  normal.  Tandem gait is minimally wide.  Romberg is negative.  Reflexes: Deep tendon reflexes are symmetric and normal bilaterally.  DIAGNOSTIC DATA (LABS, IMAGING, TESTING) - I reviewed patient records, labs, notes, testing and imaging myself where available.  Lab Results  Component Value Date   WBC 9.6 03/22/2018   HGB 16.3 (H) 03/22/2018   HCT 48.3 (H) 03/22/2018   MCV 91 03/22/2018   PLT 277 03/22/2018      Component Value Date/Time   NA 143 03/22/2018 1316   K 3.8 03/22/2018 1316   CL 104 03/22/2018 1316   CO2 24 03/22/2018 1316   GLUCOSE 116 (H) 03/22/2018 1316   GLUCOSE 98 07/07/2016 1525   BUN 10 03/22/2018 1316   CREATININE 0.86 03/22/2018 1316   CREATININE 0.76 07/07/2016 1525   CALCIUM 9.3 03/22/2018 1316   PROT 6.8 03/22/2018 1316   ALBUMIN 4.5 03/22/2018 1316   AST 13 03/22/2018 1316   ALT 12 03/22/2018 1316   ALKPHOS 102 03/22/2018 1316   BILITOT 0.4 03/22/2018 1316   GFRNONAA 81 03/22/2018 1316   GFRAA 94 03/22/2018 1316   Lab Results  Component Value Date   CHOL 199 01/22/2017   HDL 46 01/22/2017   LDLCALC 116 (H) 01/22/2017   TRIG 187 (H) 01/22/2017   CHOLHDL 4.3 01/22/2017   Lab Results  Component Value Date   HGBA1C 5.5 03/22/2018   Lab Results  Component Value Date   VITAMINB12 283 09/15/2016   Lab Results  Component Value Date   TSH 1.230 03/22/2018       ASSESSMENT AND PLAN    1. Neck pain   2. Bilateral occipital neuralgia   3. Chronic migraine   4. Numbness   5. Psoriasis      1.   Bilateral splenius capitis trigger point injection with 80 mg Depo-Medrol in 4 cc Marcaine using sterile technique.   She tolerated the procedures well with no complications.     2.   Ajovy for chronic migraine.    For now, she will continue gabapentin and amitriptyline but consider discontinuing 1 if headaches improve. 3.    Stay active and exercise as tolerated.   4.    Return to see me in 6 months and call sooner if she has  significant new or worsening symptoms    Toula Miyasaki A. Felecia Shelling, MD, PhD [id number], [id number] PM Certified in Neurology, Clinical Neurophysiology, Sleep Medicine, Pain Medicine and Neuroimaging  [facility] [address], [place],  [address] [phone]

## 2019-11-24 ENCOUNTER — Telehealth: Payer: Self-pay

## 2019-11-24 NOTE — Telephone Encounter (Signed)
Received a VM from [place] at [place] regarding pt's ajovy not being covered. A formulary exception may be initiated. Number to call for the exception is [phone]. Lorre Munroe can be reached at [phone] ext [phone].

## 2019-11-25 NOTE — Telephone Encounter (Signed)
PA submitted on [employer]. Confirmation number:[id number]. Waiting on determination to be made.

## 2019-11-27 NOTE — Telephone Encounter (Signed)
Patient called to check on the status of the PA for the ajovy states she was advised by pharmacy to contact the office.  Cb#[phone]  Please FU

## 2019-11-27 NOTE — Telephone Encounter (Signed)
Checked status of PA. PA denied but we have not received denial letter yet detailing reason for denial

## 2019-12-02 NOTE — Telephone Encounter (Signed)
Submitted urgent PA via [employer]are on CMM. [id number]. PA Case ID: [id number]. Waiting on determination.  Formulary meds: Aimovig, depakote, topamax, propranolol

## 2019-12-02 NOTE — Telephone Encounter (Signed)
Received fax from [place] that [place] approved as non-formulary 09/26/19-09/24/20. Member ID: [id number].

## 2019-12-02 NOTE — Telephone Encounter (Signed)
I called [employer] to get reason why PA denied. Spoke with Olivia Mackie. States they have on file that pt has Medicare Part D plan and PA will need to be done via this plan. Call [phone].

## 2019-12-02 NOTE — Telephone Encounter (Signed)
I called pt to let her know PA approved. She can contact pharmacy to get med filled. She verbalized understanding.

## 2019-12-08 ENCOUNTER — Telehealth: Payer: Self-pay | Admitting: Family Medicine

## 2019-12-08 ENCOUNTER — Other Ambulatory Visit: Payer: Self-pay | Admitting: Family Medicine

## 2019-12-08 ENCOUNTER — Encounter: Payer: Self-pay | Admitting: Family

## 2019-12-08 ENCOUNTER — Ambulatory Visit (INDEPENDENT_AMBULATORY_CARE_PROVIDER_SITE_OTHER): Payer: Medicare HMO | Admitting: Family

## 2019-12-08 DIAGNOSIS — J309 Allergic rhinitis, unspecified: Secondary | ICD-10-CM | POA: Diagnosis not present

## 2019-12-08 DIAGNOSIS — R42 Dizziness and giddiness: Secondary | ICD-10-CM

## 2019-12-08 DIAGNOSIS — J301 Allergic rhinitis due to pollen: Secondary | ICD-10-CM

## 2019-12-08 MED ORDER — LORATADINE 10 MG PO TABS: 10.0000 mg | ORAL_TABLET | Freq: Every day | ORAL | 11 refills | Status: DC

## 2019-12-08 MED ORDER — CETIRIZINE HCL 10 MG PO TABS: ORAL_TABLET | ORAL | 8 refills | Status: DC

## 2019-12-08 MED ORDER — MECLIZINE HCL 25 MG PO TABS: 25.0000 mg | ORAL_TABLET | Freq: Three times a day (TID) | ORAL | 1 refills | Status: DC | PRN

## 2019-12-08 NOTE — Telephone Encounter (Signed)
Left message advising new medication sent in and to call back with any further questions or concerns.

## 2019-12-08 NOTE — Telephone Encounter (Signed)
Asking for another allergy medicine .  Please advise.

## 2019-12-08 NOTE — Progress Notes (Signed)
Virtual Visit via telephone Note Due to COVID-19 pandemic this visit was conducted virtually. This visit type was conducted due to national recommendations for restrictions regarding the COVID-19 Pandemic (e.g. social distancing, sheltering in place) in an effort to limit this patient's exposure and mitigate transmission in our community. All issues noted in this document were discussed and addressed.  A physical exam was not performed with this format.  I connected with Judy Evans on 12/08/19 at 10:57 AM by telephone and verified that I am speaking with the correct person using two identifiers. Judy Evans is currently located at home and no one is currently with her during visit. The provider, Evelina Dun, FNP is located in their office at time of visit.  I discussed the limitations, risks, security and privacy concerns of performing an evaluation and management service by telephone and the availability of in person appointments. I also discussed with the patient that there may be a patient responsible charge related to this service. The patient expressed understanding and agreed to proceed.   History and Present Illness:  Dizziness This is a new problem. The current episode started in the past 7 days. The problem occurs intermittently. The problem has been gradually worsening. Associated symptoms include fatigue, headaches, nausea and vertigo. Pertinent negatives include no chills, congestion, coughing, fever, sore throat, urinary symptoms or vomiting. Associated symptoms comments: Bilateral Ear pain . The symptoms are aggravated by bending. She has tried nothing for the symptoms. The treatment provided no relief.  Sinusitis This is a new problem. The current episode started 1 to 4 weeks ago. The problem has been gradually worsening since onset. There has been no fever. Associated symptoms include ear pain, headaches and sinus pressure. Pertinent negatives include no chills,  congestion, coughing, sneezing or sore throat.      Review of Systems  Constitutional: Positive for fatigue. Negative for chills and fever.  HENT: Positive for ear pain and sinus pressure. Negative for congestion, sneezing and sore throat.   Respiratory: Negative for cough.   Gastrointestinal: Positive for nausea. Negative for vomiting.  Neurological: Positive for dizziness, vertigo and headaches.     Observations/Objective: No SOB or distress noted   Assessment and Plan: 1. Dizziness - meclizine (ANTIVERT) 25 MG tablet; Take 1 tablet (25 mg total) by mouth 3 (three) times daily as needed for dizziness.  Dispense: 90 tablet; Refill: 1  2. Allergic rhinitis due to pollen, unspecified seasonality  3. Allergic rhinitis, unspecified seasonality, unspecified trigger - cetirizine (ZYRTEC) 10 MG tablet; TAKE 1 TABLET (10 MG TOTAL) BY MOUTH DAILY.  Dispense: 90 tablet; Refill: 8  Will give antivert as needed for dizziness Fall preventions Epley exercises encouraged Start zyrtec daily, continue flonase If symptoms worsen or do not improve call office     I discussed the assessment and treatment plan with the patient. The patient was provided an opportunity to ask questions and all were answered. The patient agreed with the plan and demonstrated an understanding of the instructions.   The patient was advised to call back or seek an in-person evaluation if the symptoms worsen or if the condition fails to improve as anticipated.  The above assessment and management plan was discussed with the patient. The patient verbalized understanding of and has agreed to the management plan. Patient is aware to call the clinic if symptoms persist or worsen. Patient is aware when to return to the clinic for a follow-up visit. Patient educated on when it is appropriate to  go to the emergency department.   Time call ended:  11:10 Am  I provided 13 minutes of non-face-to-face time during this  encounter.    Evelina Dun, FNP

## 2019-12-08 NOTE — Telephone Encounter (Signed)
Claritin sent

## 2019-12-15 ENCOUNTER — Telehealth: Payer: Self-pay | Admitting: Family Medicine

## 2019-12-15 NOTE — Telephone Encounter (Signed)
lmtcb

## 2019-12-25 NOTE — Telephone Encounter (Signed)
Lmtcb  Attempts have been made to patient with no call back- this encounter will be closed.

## 2020-01-09 ENCOUNTER — Encounter: Payer: Self-pay | Admitting: Nurse Practitioner

## 2020-01-09 ENCOUNTER — Other Ambulatory Visit: Payer: Self-pay

## 2020-01-09 ENCOUNTER — Ambulatory Visit (INDEPENDENT_AMBULATORY_CARE_PROVIDER_SITE_OTHER): Payer: Medicare HMO | Admitting: Nurse Practitioner

## 2020-01-09 VITALS — BP 128/86 | HR 76 | Temp 98.1°F | Resp 20 | Ht 62.0 in | Wt 207.0 lb

## 2020-01-09 DIAGNOSIS — M542 Cervicalgia: Secondary | ICD-10-CM | POA: Diagnosis not present

## 2020-01-09 MED ORDER — PREDNISONE 20 MG PO TABS: ORAL_TABLET | ORAL | 0 refills | Status: DC

## 2020-01-09 MED ORDER — CYCLOBENZAPRINE HCL 5 MG PO TABS: 5.0000 mg | ORAL_TABLET | Freq: Three times a day (TID) | ORAL | 0 refills | Status: DC | PRN

## 2020-01-09 MED ORDER — METHYLPREDNISOLONE ACETATE 80 MG/ML IJ SUSP
80.0000 mg | Freq: Once | INTRAMUSCULAR | Status: AC
  Administered 2020-01-09: 16:00:00 80 mg via INTRAMUSCULAR

## 2020-01-09 NOTE — Patient Instructions (Signed)

## 2020-01-09 NOTE — Progress Notes (Signed)
   Subjective:    Patient ID: Judy Evans, female    DOB: 10-18-1970, 49 y.o.   MRN: [id number]   Chief Complaint: Neck Pain (running down spine. Hurts to turn head)   HPI Patient come sin today c/o neck pain. She says that she has had a pinched nerve in her neck for over a year. Has been getting injections in neck from DR. Satter every 4-6 months. Pain started getting worse 2 ays ago. Pain radiates all the way down her spine and in left arm. Moving her head up and down increases pain. Nothing makes better. She has lidocaine patches and they have not helped. She is on gabapentin and that does not help.   Review of Systems  Constitutional: Negative for diaphoresis.  Eyes: Negative for pain.  Respiratory: Negative for shortness of breath.   Cardiovascular: Negative for chest pain, palpitations and leg swelling.  Gastrointestinal: Negative for abdominal pain.  Endocrine: Negative for polydipsia.  Musculoskeletal: Positive for back pain and neck pain.  Skin: Negative for rash.  Neurological: Negative for dizziness, weakness and headaches.  Hematological: Does not bruise/bleed easily.  All other systems reviewed and are negative.      Objective:   Physical Exam Vitals and nursing note reviewed.  Constitutional:      Appearance: Normal appearance. She is obese.  Cardiovascular:     Rate and Rhythm: Normal rate and regular rhythm.     Heart sounds: Normal heart sounds.  Pulmonary:     Breath sounds: Normal breath sounds.  Musculoskeletal:     Comments: Decrease ROM of neck due  To pain on flexion. rotation and side to sie movement cause no pain.  Skin:    General: Skin is warm and dry.  Neurological:     General: No focal deficit present.     Mental Status: She is alert and oriented to person, place, and time.  Psychiatric:        Mood and Affect: Mood normal.        Behavior: Behavior normal.     BP 128/86   Pulse 76   Temp 98.1 F (36.7 C) (Temporal)   Resp 20    Ht 5\' 2"  (1.575 m)   Wt 207 lb (93.9 kg)   SpO2 97%   BMI 37.86 kg/m        Assessment & Plan:  [name] in today with chief complaint of Neck Pain (running down spine. Hurts to turn head)   1. Neck pain Moist heat Rest Can continue lidoderm pateches as needed RTO prn Sedation precautions with flexeril - methylPREDNISolone acetate (DEPO-MEDROL) injection 80 mg - predniSONE (DELTASONE) 20 MG tablet; 2 po at sametime daily for 5 days- start tomorrow  Dispense: 10 tablet; Refill: 0 - cyclobenzaprine (FLEXERIL) 5 MG tablet; Take 1 tablet (5 mg total) by mouth 3 (three) times daily as needed for muscle spasms.  Dispense: 30 tablet; Refill: 0    The above assessment and management plan was discussed with the patient. The patient verbalized understanding of and has agreed to the management plan. Patient is aware to call the clinic if symptoms persist or worsen. Patient is aware when to return to the clinic for a follow-up visit. Patient educated on when it is appropriate to go to the emergency department.   Mary-Margaret Hassell Done, FNP

## 2020-02-02 ENCOUNTER — Other Ambulatory Visit: Payer: Self-pay | Admitting: *Deleted

## 2020-02-02 DIAGNOSIS — J309 Allergic rhinitis, unspecified: Secondary | ICD-10-CM

## 2020-02-02 DIAGNOSIS — K219 Gastro-esophageal reflux disease without esophagitis: Secondary | ICD-10-CM

## 2020-02-02 MED ORDER — OMEPRAZOLE 40 MG PO CPDR: DELAYED_RELEASE_CAPSULE | ORAL | 0 refills | Status: DC

## 2020-02-02 MED ORDER — FLUTICASONE PROPIONATE 50 MCG/ACT NA SUSP: NASAL | 0 refills | Status: DC

## 2020-02-02 NOTE — Addendum Note (Signed)
Addended by: Antonietta Barcelona D on: 02/02/2020 12:16 PM   Modules accepted: Orders

## 2020-02-05 ENCOUNTER — Other Ambulatory Visit: Payer: Self-pay

## 2020-02-05 ENCOUNTER — Encounter: Payer: Self-pay | Admitting: Podiatry

## 2020-02-05 ENCOUNTER — Ambulatory Visit (INDEPENDENT_AMBULATORY_CARE_PROVIDER_SITE_OTHER): Payer: Medicare HMO | Admitting: Podiatry

## 2020-02-05 DIAGNOSIS — M79675 Pain in left toe(s): Secondary | ICD-10-CM

## 2020-02-05 DIAGNOSIS — B351 Tinea unguium: Secondary | ICD-10-CM

## 2020-02-05 DIAGNOSIS — G609 Hereditary and idiopathic neuropathy, unspecified: Secondary | ICD-10-CM | POA: Diagnosis not present

## 2020-02-05 DIAGNOSIS — M79674 Pain in right toe(s): Secondary | ICD-10-CM | POA: Diagnosis not present

## 2020-02-05 NOTE — Patient Instructions (Signed)

## 2020-02-06 ENCOUNTER — Ambulatory Visit: Payer: Medicare HMO | Admitting: Podiatry

## 2020-02-06 ENCOUNTER — Encounter: Payer: Self-pay | Admitting: Family

## 2020-02-06 ENCOUNTER — Ambulatory Visit (INDEPENDENT_AMBULATORY_CARE_PROVIDER_SITE_OTHER): Payer: Medicare HMO | Admitting: Family

## 2020-02-06 DIAGNOSIS — J019 Acute sinusitis, unspecified: Secondary | ICD-10-CM

## 2020-02-06 MED ORDER — DOXYCYCLINE HYCLATE 100 MG PO TABS: 100.0000 mg | ORAL_TABLET | Freq: Two times a day (BID) | ORAL | 0 refills | Status: DC

## 2020-02-06 NOTE — Progress Notes (Signed)
   Virtual Visit via telephone Note Due to COVID-19 pandemic this visit was conducted virtually. This visit type was conducted due to national recommendations for restrictions regarding the COVID-19 Pandemic (e.g. social distancing, sheltering in place) in an effort to limit this patient's exposure and mitigate transmission in our community. All issues noted in this document were discussed and addressed.  A physical exam was not performed with this format.  I connected with Judy Evans on 02/06/20 at 11:15 AM by telephone and verified that I am speaking with the correct person using two identifiers. Judy Evans is currently located at home and no one is currently with her during visit. The provider, Evelina Dun, FNP is located in their office at time of visit.  I discussed the limitations, risks, security and privacy concerns of performing an evaluation and management service by telephone and the availability of in person appointments. I also discussed with the patient that there may be a patient responsible charge related to this service. The patient expressed understanding and agreed to proceed.   History and Present Illness:  Sinusitis This is a new problem. The current episode started 1 to 4 weeks ago. The problem has been gradually worsening since onset. There has been no fever. Her pain is at a severity of 5/10. The pain is moderate. Associated symptoms include congestion, coughing, ear pain, headaches, sinus pressure and sneezing. Past treatments include oral decongestants and acetaminophen. The treatment provided mild relief.      Review of Systems  HENT: Positive for congestion, ear pain, sinus pressure and sneezing.   Respiratory: Positive for cough.   Neurological: Positive for headaches.  All other systems reviewed and are negative.    Observations/Objective: No SOB or distress noted   Assessment and Plan: 1. Acute sinusitis, recurrence not specified,  unspecified location - Take meds as prescribed - Use a cool mist humidifier  -Use saline nose sprays frequently -Force fluids -For any cough or congestion  Use plain Mucinex- regular strength or max strength is fine -For fever or aces or pains- take tylenol or ibuprofen. -Throat lozenges if help -RTO if symptoms worsen or do not improve  - doxycycline (VIBRA-TABS) 100 MG tablet; Take 1 tablet (100 mg total) by mouth 2 (two) times daily.  Dispense: 20 tablet; Refill: 0     I discussed the assessment and treatment plan with the patient. The patient was provided an opportunity to ask questions and all were answered. The patient agreed with the plan and demonstrated an understanding of the instructions.   The patient was advised to call back or seek an in-person evaluation if the symptoms worsen or if the condition fails to improve as anticipated.  The above assessment and management plan was discussed with the patient. The patient verbalized understanding of and has agreed to the management plan. Patient is aware to call the clinic if symptoms persist or worsen. Patient is aware when to return to the clinic for a follow-up visit. Patient educated on when it is appropriate to go to the emergency department.   Time call ended:  11:23 AM   I provided 8 minutes of non-face-to-face time during this encounter.    Evelina Dun, FNP

## 2020-02-14 NOTE — Progress Notes (Signed)
Subjective: Judy Evans is a 49 y.o. female patient seen today at [facility] with history of peripheral neuropathy and painful mycotic nails b/l that are difficult to trim. Pain interferes with ambulation. Aggravating factors include wearing enclosed shoe gear. Pain is relieved with periodic professional debridement   She still has not gotten her A1c checked due to a lot of other health issues at the moment. She continues to take gabapentin for her neuropathy symptoms.  Patient Active Problem List   Diagnosis Date Noted  . Insomnia 11/19/2018  . Intractable headache 07/18/2018  . Occipital neuralgia 07/18/2018  . Family history of cerebral aneurysm 07/18/2018  . Numbness 04/23/2018  . Cervical radiculopathy 04/23/2018  . Encounter for long-term use of opiate analgesic 04/22/2018  . Lumbar degenerative disc disease 04/22/2018  . Spondylosis of lumbar spine 04/22/2018  . Fibromyalgia affecting multiple sites 04/22/2018  . Constipation due to pain medication 04/30/2017  . Depression 03/22/2017  . Trochanteric bursitis of left hip 03/13/2017  . Neck pain 11/13/2016  . Left shoulder pain 11/13/2016  . Chronic bilateral low back pain 11/13/2016  . History of colonic polyps 06/20/2016  . IBS (irritable bowel syndrome) 06/20/2016  . Epigastric pain 06/20/2016  . Rhinitis, chronic 04/13/2016  . Otalgia of both ears 04/13/2016  . Arthralgia of right temporomandibular joint 04/13/2016  . BMI 40.0-44.9, adult ([place]) 11/22/2015  . Bilateral carpal tunnel syndrome 11/22/2015  . Tobacco abuse 11/22/2015  . Allergic rhinitis 11/01/2015  . GERD (gastroesophageal reflux disease) 11/01/2015  . LLQ pain 06/18/2015  . Bloating 06/18/2015  . Pain with urination 02/19/2015  . [place] 01/22/2015  . Pelvic pain in female 01/13/2015  . Hot flashes due to surgical menopause 01/13/2015  . Pre-diabetes 01/13/2015  . Hypertension 01/13/2015  . Chronic migraine 01/13/2015  . Psoriasis 01/13/2015   . Arthritis 01/13/2015  . Fibromyalgia 01/13/2015  . COPD (chronic obstructive pulmonary disease) ([place]) 01/13/2015  . Hyperlipidemia 01/13/2015  . Abnormal nuclear stress test 01/07/2014  . Abdominal pain 04/16/2013  . Pain in joint involving multiple sites 02/20/2013  . Head trauma 02/20/2013    Current Outpatient Medications on File Prior to Visit  Medication Sig Dispense Refill  . acetaminophen (TYLENOL) 500 MG tablet Take 1,000 mg by mouth every 6 (six) hours as needed for mild pain.    Marland Kitchen albuterol (VENTOLIN HFA) 108 (90 Base) MCG/ACT inhaler USE 2 PUFFS EVERY 4 HOURS AS NEEDED FOR WHEEZING OR SHORTNESS OF BREATH (Needs to be seen before next refill) 6.7 g 0  . amitriptyline (ELAVIL) 25 MG tablet TAKE 1 TO 2 TABLETS BY MOUTH AT BEDTIME 180 tablet 3  . BREO ELLIPTA 100-25 MCG/INH AEPB TAKE 1 PUFF BY MOUTH EVERY DAY 60 each 5  . cetirizine (ZYRTEC) 10 MG tablet Take 10 mg by mouth daily.    . COSENTYX SENSOREADY, 300 MG, 150 MG/ML SOAJ     . cyclobenzaprine (FLEXERIL) 5 MG tablet Take 1 tablet (5 mg total) by mouth 3 (three) times daily as needed for muscle spasms. 30 tablet 0  . fluticasone (FLONASE) 50 MCG/ACT nasal spray SPRAY 2 SPRAYS INTO EACH NOSTRIL EVERY DAY 16 mL 0  . Fremanezumab-vfrm (AJOVY) 225 MG/1.5ML SOSY Inject 1 Syringe into the skin every 28 (twenty-eight) days. 4.5 mL 3  . gabapentin (NEURONTIN) 800 MG tablet TAKE 1 TABLET (800 MG TOTAL) BY MOUTH 4 (FOUR) TIMES DAILY. 360 tablet 0  . ibuprofen (ADVIL,MOTRIN) 600 MG tablet Take 1 tablet (600 mg total) by mouth every  8 (eight) hours as needed. 15 tablet 0  . lidocaine (LIDODERM) 5 % PLACE 1 PATCH ONTO THE SKIN DAILY. REMOVE & DISCARD PATCH WITHIN 12 HOURS OR AS DIRECTED BY MD 30 patch 2  . loratadine (CLARITIN) 10 MG tablet Take 1 tablet (10 mg total) by mouth daily. 30 tablet 11  . meclizine (ANTIVERT) 25 MG tablet Take 1 tablet (25 mg total) by mouth 3 (three) times daily as needed for dizziness. 90 tablet 1  .  nystatin (MYCOSTATIN) 100000 UNIT/ML suspension Take 1 teaspoon, swish, gargle for 1 minute and then spit up 4 times a day until symptoms improve. 60 mL 1  . nystatin ointment (MYCOSTATIN) APPLY 1 APPLICATION TOPICALLY 2 (TWO) TIMES DAILY AS NEEDED. 30 g 0  . omeprazole (PRILOSEC) 40 MG capsule TAKE 1 CAPSULE BY MOUTH EVERY DAY (Needs to be seen before next refill) 30 capsule 0  . predniSONE (DELTASONE) 20 MG tablet 2 po at sametime daily for 5 days- start tomorrow 10 tablet 0  . SUMAtriptan (IMITREX) 100 MG tablet Take 1 tablet (100 mg total) by mouth once as needed for up to 1 dose for migraine. May repeat in 2 hours if headache persists or recurs. 10 tablet 2  . triamcinolone cream (KENALOG) 0.1 %     . [DISCONTINUED] fluticasone furoate-vilanterol (BREO ELLIPTA) 100-25 MCG/INH AEPB Inhale 1 puff into the lungs daily. 90 each 1  . [DISCONTINUED] lidocaine (LIDODERM) 5 % Place 1 patch onto the skin daily. Remove & Discard patch within 12 hours or as directed by MD 30 patch 2   No current facility-administered medications on file prior to visit.    Allergies  Allergen Reactions  . Rosuvastatin Calcium Swelling    Ends up in the hospital  . Statins Swelling    Ends up in the hospital  . Wellbutrin [Bupropion]     suicidal  . Hctz [Hydrochlorothiazide] Nausea And Vomiting and Rash  . Hydrocodone-Acetaminophen Rash  . Oxycodone Rash  . Penicillins Nausea And Vomiting and Rash    Has patient had a PCN reaction causing immediate rash, facial/tongue/throat swelling, SOB or lightheadedness with hypotension: Yes Has patient had a PCN reaction causing severe rash involving mucus membranes or skin necrosis: No Has patient had a PCN reaction that required hospitalization No Has patient had a PCN reaction occurring within the last 10 years: No If all of the above answers are "NO", then may proceed with Cephalosporin use.     Objective: Physical Exam  General: 49 y.o. pleasant Caucasian female  no acute distress.  AAO x 3.   Neurovascular status unchanged b/l.  Capillary refill time to digits immediate b/l. Palpable DP pulses b/l. Palpable PT pulses b/l. Pedal hair present b/l. Skin temperature gradient within normal limits b/l.  Protective sensation intact 5/5 intact bilaterally with 10g monofilament b/l. Vibratory sensation intact b/l. Proprioception intact bilaterally. Babinski reflex negative b/l. Clonus negative b/l.  Dermatological:  Pedal skin with normal turgor, texture and tone bilaterally. No open wounds bilaterally. No interdigital macerations bilaterally. Toenails 1-5 b/l elongated, dystrophic, thickened, crumbly with subungual debris and tenderness to dorsal palpation.  Musculoskeletal:  Normal muscle strength 5/5 to all lower extremity muscle groups bilaterally. No gross bony deformities bilaterally. No pain crepitus or joint limitation noted with ROM b/l. Patient ambulates independent of any assistive aids.  Assessment and Plan:  1. Pain due to onychomycosis of toenails of both feet   2. Neuropathy, peripheral, idiopathic    -Examined patient. -No new findings.  No new orders. -Toenails 1-5 b/l were debrided in length and girth with sterile nail nippers and dremel without iatrogenic bleeding.  -Patient to continue soft, supportive shoe gear daily. -Patient to report any pedal injuries to medical professional immediately. -Patient/POA to call should there be question/concern in the interim.  Return in about 3 months (around 05/07/2020) for nail trim.  Marzetta Board, DPM

## 2020-02-28 ENCOUNTER — Other Ambulatory Visit: Payer: Self-pay | Admitting: Nurse Practitioner

## 2020-02-28 DIAGNOSIS — J309 Allergic rhinitis, unspecified: Secondary | ICD-10-CM

## 2020-02-28 DIAGNOSIS — K219 Gastro-esophageal reflux disease without esophagitis: Secondary | ICD-10-CM

## 2020-03-01 ENCOUNTER — Other Ambulatory Visit: Payer: Self-pay | Admitting: Family

## 2020-03-01 DIAGNOSIS — J449 Chronic obstructive pulmonary disease, unspecified: Secondary | ICD-10-CM

## 2020-03-01 DIAGNOSIS — R42 Dizziness and giddiness: Secondary | ICD-10-CM

## 2020-03-01 MED ORDER — ALBUTEROL SULFATE HFA 108 (90 BASE) MCG/ACT IN AERS: INHALATION_SPRAY | RESPIRATORY_TRACT | 0 refills | Status: DC

## 2020-03-08 DIAGNOSIS — L0100 Impetigo, unspecified: Secondary | ICD-10-CM | POA: Diagnosis not present

## 2020-03-08 DIAGNOSIS — Z79899 Other long term (current) drug therapy: Secondary | ICD-10-CM | POA: Diagnosis not present

## 2020-03-23 ENCOUNTER — Other Ambulatory Visit: Payer: Self-pay | Admitting: Nurse Practitioner

## 2020-03-23 DIAGNOSIS — K219 Gastro-esophageal reflux disease without esophagitis: Secondary | ICD-10-CM

## 2020-03-24 NOTE — Telephone Encounter (Signed)
ntbs for refills 30 day supply was sent in 03/01/20

## 2020-04-15 ENCOUNTER — Ambulatory Visit (INDEPENDENT_AMBULATORY_CARE_PROVIDER_SITE_OTHER): Payer: Medicare HMO | Admitting: Family

## 2020-04-15 ENCOUNTER — Encounter: Payer: Self-pay | Admitting: Family

## 2020-04-15 DIAGNOSIS — M79662 Pain in left lower leg: Secondary | ICD-10-CM | POA: Diagnosis not present

## 2020-04-15 DIAGNOSIS — J019 Acute sinusitis, unspecified: Secondary | ICD-10-CM | POA: Diagnosis not present

## 2020-04-15 MED ORDER — GABAPENTIN 800 MG PO TABS: 800.0000 mg | ORAL_TABLET | Freq: Four times a day (QID) | ORAL | 0 refills | Status: DC

## 2020-04-15 MED ORDER — DOXYCYCLINE HYCLATE 100 MG PO TABS: 100.0000 mg | ORAL_TABLET | Freq: Two times a day (BID) | ORAL | 0 refills | Status: DC

## 2020-04-15 NOTE — Progress Notes (Signed)
Virtual Visit via telephone Note Due to COVID-19 pandemic this visit was conducted virtually. This visit type was conducted due to national recommendations for restrictions regarding the COVID-19 Pandemic (e.g. social distancing, sheltering in place) in an effort to limit this patient's exposure and mitigate transmission in our community. All issues noted in this document were discussed and addressed.  A physical exam was not performed with this format.  I connected with Judy Evans on 04/15/20 at 2:26 pm by telephone and verified that I am speaking with the correct person using two identifiers. Judy Evans is currently located at home  and no one is currently with her during visit. The provider, Evelina Dun, FNP is located in their office at time of visit.  I discussed the limitations, risks, security and privacy concerns of performing an evaluation and management service by telephone and the availability of in person appointments. I also discussed with the patient that there may be a patient responsible charge related to this service. The patient expressed understanding and agreed to proceed.   History and Present Illness:  Sinusitis This is a new problem. The current episode started 1 to 4 weeks ago. The problem has been gradually worsening since onset. Maximum temperature: 99. Her pain is at a severity of 4/10 (with out tyelnol 8 out 10). The pain is mild. Associated symptoms include congestion, coughing, ear pain, headaches, sinus pressure and sneezing. Pertinent negatives include no chills or shortness of breath. Past treatments include acetaminophen and oral decongestants. The treatment provided mild relief.      Review of Systems  Constitutional: Negative for chills.  HENT: Positive for congestion, ear pain, sinus pressure and sneezing.   Respiratory: Positive for cough. Negative for shortness of breath.   Neurological: Positive for headaches.  All other systems  reviewed and are negative.    Observations/Objective: No SOB or distress noted  Assessment and Plan: 1. Acute sinusitis, recurrence not specified, unspecified location - Take meds as prescribed - Use a cool mist humidifier  -Use saline nose sprays frequently -Force fluids -For any cough or congestion  Use plain Mucinex- regular strength or max strength is fine -For fever or aces or pains- take tylenol or ibuprofen. -Throat lozenges if help -Call the office if symptoms worsen or do not improve  - doxycycline (VIBRA-TABS) 100 MG tablet; Take 1 tablet (100 mg total) by mouth 2 (two) times daily.  Dispense: 20 tablet; Refill: 0  2. Pain of left lower leg - gabapentin (NEURONTIN) 800 MG tablet; Take 1 tablet (800 mg total) by mouth 4 (four) times daily.  Dispense: 360 tablet; Refill: 0     I discussed the assessment and treatment plan with the patient. The patient was provided an opportunity to ask questions and all were answered. The patient agreed with the plan and demonstrated an understanding of the instructions.   The patient was advised to call back or seek an in-person evaluation if the symptoms worsen or if the condition fails to improve as anticipated.  The above assessment and management plan was discussed with the patient. The patient verbalized understanding of and has agreed to the management plan. Patient is aware to call the clinic if symptoms persist or worsen. Patient is aware when to return to the clinic for a follow-up visit. Patient educated on when it is appropriate to go to the emergency department.   Time call ended:  2:35 pm   I provided 9 minutes of non-face-to-face time during this encounter.  Evelina Dun, FNP

## 2020-05-10 ENCOUNTER — Encounter: Payer: Self-pay | Admitting: Podiatry

## 2020-05-10 ENCOUNTER — Ambulatory Visit (INDEPENDENT_AMBULATORY_CARE_PROVIDER_SITE_OTHER): Payer: Medicare HMO | Admitting: Podiatry

## 2020-05-10 ENCOUNTER — Other Ambulatory Visit: Payer: Self-pay

## 2020-05-10 DIAGNOSIS — M25571 Pain in right ankle and joints of right foot: Secondary | ICD-10-CM

## 2020-05-10 DIAGNOSIS — G8929 Other chronic pain: Secondary | ICD-10-CM

## 2020-05-10 DIAGNOSIS — M79675 Pain in left toe(s): Secondary | ICD-10-CM

## 2020-05-10 DIAGNOSIS — B351 Tinea unguium: Secondary | ICD-10-CM | POA: Diagnosis not present

## 2020-05-10 DIAGNOSIS — G609 Hereditary and idiopathic neuropathy, unspecified: Secondary | ICD-10-CM

## 2020-05-10 DIAGNOSIS — M79674 Pain in right toe(s): Secondary | ICD-10-CM | POA: Diagnosis not present

## 2020-05-13 NOTE — Progress Notes (Signed)
Subjective: Judy Evans is a 49 y.o. female patient seen today at [facility] with history of peripheral neuropathy and painful mycotic nails b/l that are difficult to trim. Pain interferes with ambulation. Aggravating factors include wearing enclosed shoe gear. Pain is relieved with periodic professional debridement   She states she has been grieving the loss of her cousin who passed away from [place]. She has had several other losses since her last visit.  States her left ankle is bothering her. She has a tall walking boot at home. Denies any new episodes of trauma. Continues to have neuropathic pain managed by pain management.  Her sister is also in the [facility] and she plans to see her today, if possible.  Patient Active Problem List   Diagnosis Date Noted  . Insomnia 11/19/2018  . Intractable headache 07/18/2018  . Occipital neuralgia 07/18/2018  . Family history of cerebral aneurysm 07/18/2018  . Numbness 04/23/2018  . Cervical radiculopathy 04/23/2018  . Encounter for long-term use of opiate analgesic 04/22/2018  . Lumbar degenerative disc disease 04/22/2018  . Spondylosis of lumbar spine 04/22/2018  . Fibromyalgia affecting multiple sites 04/22/2018  . Constipation due to pain medication 04/30/2017  . Depression 03/22/2017  . Trochanteric bursitis of left hip 03/13/2017  . Neck pain 11/13/2016  . Left shoulder pain 11/13/2016  . Chronic bilateral low back pain 11/13/2016  . History of colonic polyps 06/20/2016  . IBS (irritable bowel syndrome) 06/20/2016  . Epigastric pain 06/20/2016  . Rhinitis, chronic 04/13/2016  . Otalgia of both ears 04/13/2016  . Arthralgia of right temporomandibular joint 04/13/2016  . BMI 40.0-44.9, adult ([place]) 11/22/2015  . Bilateral carpal tunnel syndrome 11/22/2015  . Tobacco abuse 11/22/2015  . Allergic rhinitis 11/01/2015  . GERD (gastroesophageal reflux disease) 11/01/2015  . LLQ pain 06/18/2015  . Bloating 06/18/2015  . Pain  with urination 02/19/2015  . [place] 01/22/2015  . Pelvic pain in female 01/13/2015  . Hot flashes due to surgical menopause 01/13/2015  . Pre-diabetes 01/13/2015  . Hypertension 01/13/2015  . Chronic migraine 01/13/2015  . Psoriasis 01/13/2015  . Arthritis 01/13/2015  . Fibromyalgia 01/13/2015  . COPD (chronic obstructive pulmonary disease) ([place]) 01/13/2015  . Hyperlipidemia 01/13/2015  . Abnormal nuclear stress test 01/07/2014  . Abdominal pain 04/16/2013  . Pain in joint involving multiple sites 02/20/2013  . Head trauma 02/20/2013    Current Outpatient Medications on File Prior to Visit  Medication Sig Dispense Refill  . acetaminophen (TYLENOL) 500 MG tablet Take 1,000 mg by mouth every 6 (six) hours as needed for mild pain.    Marland Kitchen albuterol (VENTOLIN HFA) 108 (90 Base) MCG/ACT inhaler USE 2 PUFFS EVERY 4 HOURS AS NEEDED FOR WHEEZING OR SHORTNESS OF BREATH (Needs to be seen before next refill) 6.7 g 0  . amitriptyline (ELAVIL) 25 MG tablet TAKE 1 TO 2 TABLETS BY MOUTH AT BEDTIME 180 tablet 3  . BREO ELLIPTA 100-25 MCG/INH AEPB TAKE 1 PUFF BY MOUTH EVERY DAY 60 each 5  . cetirizine (ZYRTEC) 10 MG tablet Take 10 mg by mouth daily.    . COSENTYX SENSOREADY, 300 MG, 150 MG/ML SOAJ     . cyclobenzaprine (FLEXERIL) 5 MG tablet Take 1 tablet (5 mg total) by mouth 3 (three) times daily as needed for muscle spasms. 30 tablet 0  . doxycycline (VIBRA-TABS) 100 MG tablet Take 1 tablet (100 mg total) by mouth 2 (two) times daily. 20 tablet 0  . fluticasone (FLONASE) 50 MCG/ACT nasal spray  SPRAY 2 SPRAYS INTO EACH NOSTRIL EVERY DAY 16 mL 2  . Fremanezumab-vfrm (AJOVY) 225 MG/1.5ML SOSY Inject 1 Syringe into the skin every 28 (twenty-eight) days. 4.5 mL 3  . gabapentin (NEURONTIN) 800 MG tablet Take 1 tablet (800 mg total) by mouth 4 (four) times daily. 360 tablet 0  . ibuprofen (ADVIL,MOTRIN) 600 MG tablet Take 1 tablet (600 mg total) by mouth every 8 (eight) hours as needed. 15 tablet 0  .  lidocaine (LIDODERM) 5 % PLACE 1 PATCH ONTO THE SKIN DAILY. REMOVE & DISCARD PATCH WITHIN 12 HOURS OR AS DIRECTED BY MD 30 patch 2  . loratadine (CLARITIN) 10 MG tablet Take 1 tablet (10 mg total) by mouth daily. 30 tablet 11  . meclizine (ANTIVERT) 25 MG tablet TAKE 1 TABLET (25 MG TOTAL) BY MOUTH 3 (THREE) TIMES DAILY AS NEEDED FOR DIZZINESS. 90 tablet 1  . nystatin (MYCOSTATIN) 100000 UNIT/ML suspension Take 1 teaspoon, swish, gargle for 1 minute and then spit up 4 times a day until symptoms improve. 60 mL 1  . nystatin ointment (MYCOSTATIN) APPLY 1 APPLICATION TOPICALLY 2 (TWO) TIMES DAILY AS NEEDED. 30 g 0  . omeprazole (PRILOSEC) 40 MG capsule TAKE 1 CAPSULE BY MOUTH EVERY DAY (NEEDS TO BE SEEN BEFORE NEXT REFILL) 30 capsule 0  . SUMAtriptan (IMITREX) 100 MG tablet Take 1 tablet (100 mg total) by mouth once as needed for up to 1 dose for migraine. May repeat in 2 hours if headache persists or recurs. 10 tablet 2  . triamcinolone cream (KENALOG) 0.1 %     . triamcinolone ointment (KENALOG) 0.5 % Apply topically 2 (two) times daily.    . [DISCONTINUED] fluticasone furoate-vilanterol (BREO ELLIPTA) 100-25 MCG/INH AEPB Inhale 1 puff into the lungs daily. 90 each 1  . [DISCONTINUED] lidocaine (LIDODERM) 5 % Place 1 patch onto the skin daily. Remove & Discard patch within 12 hours or as directed by MD 30 patch 2   No current facility-administered medications on file prior to visit.    Allergies  Allergen Reactions  . Rosuvastatin Calcium Swelling    Ends up in the hospital  . Statins Swelling    Ends up in the hospital  . Wellbutrin [Bupropion]     suicidal  . Hctz [Hydrochlorothiazide] Nausea And Vomiting and Rash  . Hydrocodone-Acetaminophen Rash  . Oxycodone Rash  . Penicillins Nausea And Vomiting and Rash    Has patient had a PCN reaction causing immediate rash, facial/tongue/throat swelling, SOB or lightheadedness with hypotension: Yes Has patient had a PCN reaction causing severe  rash involving mucus membranes or skin necrosis: No Has patient had a PCN reaction that required hospitalization No Has patient had a PCN reaction occurring within the last 10 years: No If all of the above answers are "NO", then may proceed with Cephalosporin use.     Objective: Physical Exam  General: 49 y.o. pleasant Caucasian female in no acute distress day.  AAO x 3.   Neurovascular status unchanged b/l.  Capillary refill time to digits immediate b/l. Palpable DP pulses b/l. Palpable PT pulses b/l. Pedal hair present b/l. Skin temperature gradient within normal limits b/l.  Protective sensation intact 5/5 intact bilaterally with 10g monofilament b/l. Vibratory sensation intact b/l. Proprioception intact bilaterally. Babinski reflex negative b/l. Clonus negative b/l.  Dermatological:  Pedal skin with normal turgor, texture and tone bilaterally. No open wounds bilaterally. No interdigital macerations bilaterally. Toenails 1-5 b/l elongated, dystrophic, thickened, crumbly with subungual debris and tenderness to dorsal  palpation.  Musculoskeletal:  Normal muscle strength 5/5 to all lower extremity muscle groups bilaterally. No gross bony deformities bilaterally. No pain crepitus or joint limitation noted with ROM b/l. Patient ambulates independent of any assistive aids. Right ankle with no edema, no erythema, no eccchymosis.  Assessment and Plan:  1. Pain due to onychomycosis of toenails of both feet   2. Neuropathy, peripheral, idiopathic   3. Chronic pain of right ankle    -Examined patient. -Toenails 1-5 b/l were debrided in length and girth with sterile nail nippers and dremel without iatrogenic bleeding.  -Patient to report any pedal injuries to medical professional immediately. -Applied ace bandage for right ankle on today. Patient noted relief. She has walking boot at home and wears prn. -Patient to continue soft, supportive shoe gear daily. -Patient/POA to call should there be  question/concern in the interim.  Return in about 3 months (around 08/10/2020) for nail trim.  Marzetta Board, DPM

## 2020-05-20 ENCOUNTER — Telehealth: Payer: Self-pay | Admitting: Family

## 2020-05-20 NOTE — Telephone Encounter (Signed)
  Prescription Request  05/20/2020  What is the name of the medication or equipment? Gabapentin 800 mg - Been waiting a month on this medication and hadn't got it yet. Supposed to been a prior authorization and has not heard anything  Have you contacted your pharmacy to request a refill? (if applicable) YES  Which pharmacy would you like this sent to? [facility] in [place]   Patient notified that their request is being sent to the clinical staff for review and that they should receive a response within 2 business days.

## 2020-05-24 ENCOUNTER — Ambulatory Visit (INDEPENDENT_AMBULATORY_CARE_PROVIDER_SITE_OTHER): Payer: Medicare HMO | Admitting: Neurology

## 2020-05-24 ENCOUNTER — Encounter: Payer: Self-pay | Admitting: Neurology

## 2020-05-24 ENCOUNTER — Other Ambulatory Visit: Payer: Self-pay

## 2020-05-24 ENCOUNTER — Telehealth: Payer: Self-pay | Admitting: Neurology

## 2020-05-24 VITALS — BP 123/79 | HR 74 | Ht 62.0 in | Wt 200.5 lb

## 2020-05-24 DIAGNOSIS — IMO0002 Reserved for concepts with insufficient information to code with codable children: Secondary | ICD-10-CM

## 2020-05-24 DIAGNOSIS — M25512 Pain in left shoulder: Secondary | ICD-10-CM | POA: Diagnosis not present

## 2020-05-24 DIAGNOSIS — G43709 Chronic migraine without aura, not intractable, without status migrainosus: Secondary | ICD-10-CM | POA: Diagnosis not present

## 2020-05-24 DIAGNOSIS — M5412 Radiculopathy, cervical region: Secondary | ICD-10-CM

## 2020-05-24 DIAGNOSIS — M7062 Trochanteric bursitis, left hip: Secondary | ICD-10-CM | POA: Diagnosis not present

## 2020-05-24 DIAGNOSIS — M5136 Other intervertebral disc degeneration, lumbar region: Secondary | ICD-10-CM

## 2020-05-24 DIAGNOSIS — M542 Cervicalgia: Secondary | ICD-10-CM | POA: Diagnosis not present

## 2020-05-24 DIAGNOSIS — M47816 Spondylosis without myelopathy or radiculopathy, lumbar region: Secondary | ICD-10-CM | POA: Diagnosis not present

## 2020-05-24 NOTE — Telephone Encounter (Signed)
PA never received- started PA  Gabapentin 800MG  tablets   Key: [id number]  PA Case ID: [id number]  Sent to plan

## 2020-05-24 NOTE — Telephone Encounter (Signed)
[employer] medicare/medicaid order sent to GI. They will reach out to the patient to schedule.

## 2020-05-24 NOTE — Progress Notes (Signed)
[facility]  PATIENT: Judy Evans DOB: [birth year]  REFERRING DOCTOR OR PCP:  Dr. Evette Doffing SOURCE: Patient, notes from Dr. Evette Doffing imaging reports, MRI images on PACS.  _________________________________   HISTORICAL  CHIEF COMPLAINT:  Chief Complaint  Patient presents with  . Follow-up    RM 13, alone. Last seen 11/20/19. Here to f/u on neck pain/migraines.     HISTORY OF PRESENT ILLNESS:  Judy Evans is a 49 y.o. woman with headaches, shoulder pain and neck pain.  Update 05/24/20: Since the last visit, she started Ajovy.   She is having 2-3 migraine headaches a month since starting Ajovy so the frequency is much better  However, when present they are still intense.    She still has neck pain that is frequent and often mild but sometimes intense.    Splenius capitus TPI/ONB helped HA more than neck pain.   Her neck pain can be either side (right side today).   When more intense, pain goes into her shoulders, more likely the left side  She also has lower back pain.  Pain is worse with bending backward.    She has facet hypertrophy at L4L5 and L5S1 but there was no nerve root compression on a 2017 MRI (in [place]).  She also has left hip pain.   In the past a trochanteric bursa injection on the left helped x many months.  MRI Cervical 08/03/2020 IMPRESSION: This MRI of the cervical spine without contrast shows the following: 1.    At C3-C4, there is disc bulging and a left disc osteophyte complex causing mild left foraminal narrowing but no nerve root compression.   This level is unchanged compared to the 07/14/2016 MRI. 2.    At C4-C5, there is minimal disc bulging towards the right that has progressed since the 2017 MRI.  There is no nerve root compression or spinal stenosis. 3.    At C5-C6, there is disc bulging and a left disc osteophyte complex causing mild left foraminal narrowing but no nerve root compression.  This level is unchanged compared to  the 2017 MRI. 4.    Spinal cord appears normal.  Update 11/20/2019: She is currently on gabapentin and amitriptyline.   She was prescribed Emgality she never received any.   Splenius capitus/occipitla nerve injections have helped but only 3 months.      She has had headaches 30/30 days a month over the past few months.  She has nausea, photophobia, phonophobia.  Moving worsens the pain.    She feels tired when headache is worse.   Right side is worse than left.   Tylenol arthritis helps a little bit but then pain intensifies again.    MRi in 2019 showed multilevel DJD but no definite nerve root compression.     Update 05/20/2019: She feels she is doing a little better with fewer headache days..   She is still having 3 severe headaches a month with nausea and photophobia.  These can be intense.   However, she continues to have 15 days or so of milder headaches a month..  The intensity is not as bad as it was at the last visit.  Milder headaches are often occipital.  She takes amitriptyline 50 mg nightly and gabapentin 800 mg po tid.   these medications have only mildly helped.  Topiramate made her feel sick.   She got some benefit from trigger point injections performed at the last visit that lasted a couple months.  She  feels her weight is stable.     Update 11/19/2018: After the last visit when we do a splenius capitis/occipital nerve block headaches improved.  Over the last 2 to 3 weeks the headaches have returned.  She has a pressure-like sensation in the head and neck.    Pain starts in the neck and radiates up.   Her neck feels stiff.   Pain is worse with noises and bright lights and best with rest.    Additionally she is having nausea.  Tylenol slightly takes the edge off for a few hours.   She is on amitriptyline.      Update 07/18/2018: She reports a lot of headache in the back of the head on the left, rarely on both sides.   Pain is aching in quality.   She has nausea and some vomiting    Bending over and noise and movements worsens the pain.    Nothing makes it better.    She denies specific arm weakness but notes general arm > leg weakness.   Bladder is doing well.  Gait is fine.    She sees Dr. Francesco Runner for Pain management and had a recent cervical ESI --- this helped pain a short time.   She also notes some muscle aches and has been diagnosed with FMS.   She has some sleep maintenance insomnia.     NCV/EMG 04/23/18 Impression: This NCV/EMG study shows the following: 1.   Mild chronic left C7 radiculopathy without active features 2.   No evidence of median or ulnar neuropathies.    Her maternal grandfather and maternal great grandfather had cerebral aneurysm.     Update 03/18/2018: She feels her migraines are helping a lot since the amitriptyline was titrated up.     She does not get the severe ones anymore and most of the headaches are short and mild.    At a prior visit, we did a shoulder injection and the pain was much better until a few months ago.  Pain is worse with driving and with elevating the arm.    She will note some numbness in her hand at times.     She also has left hip and buttock pain.   A trochanteric bursa injection helped her a lot in the past.      She has numbness in her arms, left > right.  This fluctuates some.   MRI shows C3C4 > C5C6 DJD with foraminal narrowing to the left.       From 03/13/2017:  Her more constant headache and migraines are better on amitriptyline but she continues to get the sharp pain at times that is felt mostly in the right temple.    She tolerates the amitriptyline well.   HA History:   She has had headaches off and on x many years but they worsened 4-5 years ago. She had three especially bad headaches associated with difficulty speaking.   She saw a neurologist at [facility].   She was placed on amitriptyline with some benefit.   However, when she moved, her new PCP would not right amitriptyline for her.   She was referred to a pain  management practice.    When they occur, she often gets sharp pains on the right.   Pain is stabbing.   She gets nausea but rarely has vomiting.   She has had no furhter migraine with aphasia.  Moving increases her pain.   Bright lights and noises increases her pain.  Amitriptyline has helped the headaches Triptans help sometimes but not always.   Tylenol arthritis helps a little bit.   .  FMS/neck pain/LBP:  She still notes the fibromyalgia pain, mostly in the back, neck and hips.     She also has left hip bursitis and at times it seems like the leg is going to give out.     She is on Cymbalta and gabapentin (800 mg po qid) with some benefit.   She is also on Dilaudid 2 mg qid with benfit.  She has had lumbar spinal injections with benefit.    She sees Dr. Francesco Runner for Pain Management [facility]) for pain management.        Shoulder pain:   The left shoulder pain did better withteh subacromial bursa injection but benefit was only about 2 weeks. Pain increases with elevation and external rotation.    I personally reviewed an MRI of the cervical spine dated 07/14/2016. At C3-C4, she has a disc osteophyte complex causing moderate left foraminal narrowing with some encroachment upon the left C4 nerve root. At C5-C6 she has mild left foraminal narrowing due to a small disc osteophyte complex. There does not appear to be any nerve root compression.  REVIEW OF SYSTEMS: Constitutional: No fevers, chills, sweats, or change in appetite Eyes: No visual changes, double vision, eye pain Ear, nose and throat: No hearing loss, ear pain, nasal congestion, sore throat Cardiovascular: No chest pain, palpitations Respiratory: No shortness of breath at rest or with exertion.   No wheezes GastrointestinaI: has Irritable bowel Genitourinary: No dysuria, urinary retention or frequency.  No nocturia. Musculoskeletal: as above, fibromyalgia/neck pain/ shoulder pain/back pain Integumentary: Has psoriasis Neurological:  as above Psychiatric: No depression at this time.  No anxiety Endocrine: has pre-diabetesNo palpitations, diaphoresis, change in appetite, change in weigh or increased thirst Hematologic/Lymphatic: No anemia, purpura, petechiae. Allergic/Immunologic: No itchy/runny eyes, nasal congestion, recent allergic reactions, rashes  ALLERGIES: Allergies  Allergen Reactions  . Rosuvastatin Calcium Swelling    Ends up in the hospital  . Statins Swelling    Ends up in the hospital  . Wellbutrin [Bupropion]     suicidal  . Hctz [Hydrochlorothiazide] Nausea And Vomiting and Rash  . Hydrocodone-Acetaminophen Rash  . Oxycodone Rash  . Penicillins Nausea And Vomiting and Rash    Has patient had a PCN reaction causing immediate rash, facial/tongue/throat swelling, SOB or lightheadedness with hypotension: Yes Has patient had a PCN reaction causing severe rash involving mucus membranes or skin necrosis: No Has patient had a PCN reaction that required hospitalization No Has patient had a PCN reaction occurring within the last 10 years: No If all of the above answers are "NO", then may proceed with Cephalosporin use.     HOME MEDICATIONS:  Current Outpatient Medications:  .  acetaminophen (TYLENOL) 500 MG tablet, Take 1,000 mg by mouth every 6 (six) hours as needed for mild pain., Disp: , Rfl:  .  albuterol (VENTOLIN HFA) 108 (90 Base) MCG/ACT inhaler, USE 2 PUFFS EVERY 4 HOURS AS NEEDED FOR WHEEZING OR SHORTNESS OF BREATH (Needs to be seen before next refill), Disp: 6.7 g, Rfl: 0 .  amitriptyline (ELAVIL) 25 MG tablet, TAKE 1 TO 2 TABLETS BY MOUTH AT BEDTIME, Disp: 180 tablet, Rfl: 3 .  BREO ELLIPTA 100-25 MCG/INH AEPB, TAKE 1 PUFF BY MOUTH EVERY DAY, Disp: 60 each, Rfl: 5 .  cetirizine (ZYRTEC) 10 MG tablet, Take 10 mg by mouth daily., Disp: , Rfl:  .  COSENTYX  SENSOREADY, 300 MG, 150 MG/ML SOAJ, , Disp: , Rfl:  .  cyclobenzaprine (FLEXERIL) 5 MG tablet, Take 1 tablet (5 mg total) by mouth 3 (three)  times daily as needed for muscle spasms., Disp: 30 tablet, Rfl: 0 .  doxycycline (VIBRA-TABS) 100 MG tablet, Take 1 tablet (100 mg total) by mouth 2 (two) times daily., Disp: 20 tablet, Rfl: 0 .  fluticasone (FLONASE) 50 MCG/ACT nasal spray, SPRAY 2 SPRAYS INTO EACH NOSTRIL EVERY DAY, Disp: 16 mL, Rfl: 2 .  Fremanezumab-vfrm (AJOVY) 225 MG/1.5ML SOSY, Inject 1 Syringe into the skin every 28 (twenty-eight) days., Disp: 4.5 mL, Rfl: 3 .  gabapentin (NEURONTIN) 800 MG tablet, Take 1 tablet (800 mg total) by mouth 4 (four) times daily., Disp: 360 tablet, Rfl: 0 .  ibuprofen (ADVIL,MOTRIN) 600 MG tablet, Take 1 tablet (600 mg total) by mouth every 8 (eight) hours as needed., Disp: 15 tablet, Rfl: 0 .  lidocaine (LIDODERM) 5 %, PLACE 1 PATCH ONTO THE SKIN DAILY. REMOVE & DISCARD PATCH WITHIN 12 HOURS OR AS DIRECTED BY MD, Disp: 30 patch, Rfl: 2 .  loratadine (CLARITIN) 10 MG tablet, Take 1 tablet (10 mg total) by mouth daily., Disp: 30 tablet, Rfl: 11 .  meclizine (ANTIVERT) 25 MG tablet, TAKE 1 TABLET (25 MG TOTAL) BY MOUTH 3 (THREE) TIMES DAILY AS NEEDED FOR DIZZINESS., Disp: 90 tablet, Rfl: 1 .  nystatin (MYCOSTATIN) 100000 UNIT/ML suspension, Take 1 teaspoon, swish, gargle for 1 minute and then spit up 4 times a day until symptoms improve., Disp: 60 mL, Rfl: 1 .  nystatin ointment (MYCOSTATIN), APPLY 1 APPLICATION TOPICALLY 2 (TWO) TIMES DAILY AS NEEDED., Disp: 30 g, Rfl: 0 .  omeprazole (PRILOSEC) 40 MG capsule, TAKE 1 CAPSULE BY MOUTH EVERY DAY (NEEDS TO BE SEEN BEFORE NEXT REFILL), Disp: 30 capsule, Rfl: 0 .  SUMAtriptan (IMITREX) 100 MG tablet, Take 1 tablet (100 mg total) by mouth once as needed for up to 1 dose for migraine. May repeat in 2 hours if headache persists or recurs., Disp: 10 tablet, Rfl: 2 .  triamcinolone cream (KENALOG) 0.1 %, , Disp: , Rfl:  .  triamcinolone ointment (KENALOG) 0.5 %, Apply topically 2 (two) times daily., Disp: , Rfl:   PAST MEDICAL HISTORY: Past Medical History:   Diagnosis Date  . Anxiety   . Aphasia   . Arthritis   . COPD (chronic obstructive pulmonary disease) ([place])   . DDD (degenerative disc disease), cervical   . DDD (degenerative disc disease), lumbar   . Degenerative disc disease at L5-S1 level   . Diabetes mellitus without complication ([place])    States she does not have  . Fibromyalgia   . GERD (gastroesophageal reflux disease)   . H. pylori infection   . Hyperlipidemia    States she does not have  . Hypertension    States she does not have  . IBS (irritable bowel syndrome)   . LLQ pain 06/18/2015  . Migraines   . [place] 01/22/2015  . Pain with urination 02/19/2015  . Psoriasis   . Scoliosis   . Spondylolysis     PAST SURGICAL HISTORY: Past Surgical History:  Procedure Laterality Date  . ABDOMINAL HYSTERECTOMY    . BIOPSY  09/14/2016   Procedure: BIOPSY;  Surgeon: Daneil Dolin, MD;  [facility];  Service: Endoscopy;;  gastric   . CARDIAC CATHETERIZATION  2015   patient reported it to be normal. "I was dying in pain during procedure".   Marland Kitchen  CARPAL TUNNEL RELEASE Right   . CESAREAN SECTION    . CHOLECYSTECTOMY    . COLONOSCOPY  06/2014   Altamese Dilling DeMason: normal  . DILATION AND CURETTAGE OF UTERUS    . ESOPHAGOGASTRODUODENOSCOPY  07/2014   Burke Keels: Normal  . ESOPHAGOGASTRODUODENOSCOPY (EGD) WITH PROPOFOL N/A 09/14/2016   Dr. Gala Romney: Normal esophagus status post empiric dilation, erosive gastropathy with biopsies showing chronic inactive gastritis, no H pylori.  Marland Kitchen FOOT SURGERY Bilateral    removal of heel spurs  . MALONEY DILATION  09/14/2016   Procedure: Venia Minks DILATION;  Surgeon: Daneil Dolin, MD;  Location: [facility];  Service: Endoscopy;;  . rotator cuff surgery Right   . TONSILLECTOMY      FAMILY HISTORY: Family History  Problem Relation Age of Onset  . COPD Mother   . Diabetes Mother   . Osteoarthritis Mother   . Atrial fibrillation Mother   . Heart disease Mother   . Healthy Father   .  Lupus Sister   . CAD Sister   . Diabetes Sister   . Early death Brother        pneumonia  . Asthma Daughter   . Asthma Son   . Cancer Maternal Grandmother 76       colon  . Arthritis Maternal Grandmother   . Diabetes Maternal Grandmother   . Dementia Maternal Grandmother   . Other Maternal Grandfather        brain tumor  . Cancer Paternal Grandmother        breast  . Cancer Paternal Grandfather        lung  . Other Paternal Grandfather        brain aneurysm  . Factor V Leiden deficiency Sister   . Arthritis Sister   . Psoriasis Sister     SOCIAL HISTORY:  Social History   Socioeconomic History  . Marital status: Legally Separated    Spouse name: Not on file  . Number of children: 2  . Years of education: GED  . Highest education level: GED or equivalent  Occupational History  . Occupation: Disabled  Tobacco Use  . Smoking status: Current Every Day Smoker    Packs/day: 1.50    Years: 30.00    Pack years: 45.00    Types: Cigarettes  . Smokeless tobacco: Never Used  Vaping Use  . Vaping Use: Former  Substance and Sexual Activity  . Alcohol use: No  . Drug use: No  . Sexual activity: Not Currently    Birth control/protection: Surgical    Comment: hyst  Other Topics Concern  . Not on file  Social History Narrative   Caffeine use: Drinks coffee (2 cups per day), tea/soda- 2-3 cups per day   Right handed   Lives alone   Social Determinants of Health   Financial Resource Strain:   . Difficulty of Paying Living Expenses: Not on file  Food Insecurity:   . Worried About Charity fundraiser in the Last Year: Not on file  . Ran Out of Food in the Last Year: Not on file  Transportation Needs:   . Lack of Transportation (Medical): Not on file  . Lack of Transportation (Non-Medical): Not on file  Physical Activity:   . Days of Exercise per Week: Not on file  . Minutes of Exercise per Session: Not on file  Stress:   . Feeling of Stress : Not on file  Social  Connections:   . Frequency of Communication with  Friends and Family: Not on file  . Frequency of Social Gatherings with Friends and Family: Not on file  . Attends Religious Services: Not on file  . Active Member of Clubs or Organizations: Not on file  . Attends Archivist Meetings: Not on file  . Marital Status: Not on file  Intimate Partner Violence:   . Fear of Current or Ex-Partner: Not on file  . Emotionally Abused: Not on file  . Physically Abused: Not on file  . Sexually Abused: Not on file     PHYSICAL EXAM  Vitals:   05/24/20 1104  BP: 123/79  Pulse: 74  Weight: 200 lb 8 oz (90.9 kg)  Height: 5\' 2"  (1.575 m)    Body mass index is 36.67 kg/m.   General: The patient is well-developed and well-nourished and in no acute distress   Musculoskeletal: She has tenderness over the lower cervical paraspinal muscles, right greater than left and the right trapezius muscle.   She does have some tenderness over some of the fibromyalgia tender points of the upper chest and back.  There is tenderness over the left trochanteric bursa but not the right.  There is also tenderness over the lower lumbar paraspinal muscles.   Neurologic Exam  Mental status: The patient is alert and oriented x 3 at the time of the examination. The patient has apparent normal recent and remote memory, with an apparently normal attention span and concentration ability.   Speech is normal.  Cranial nerves: Extraocular muscles are intact.  Facial strength and sensation is normal.  Trapezius strength is normal.  No obvious hearing deficits are noted.  Motor:  Muscle bulk is normal.   Muscle tone is normal. Strength is 5/5 in the arms or legs..   Sensory: Sensory testing is intact to pinprick, soft touch and vibration sensation in all 4 extremities.  Coordination: Cerebellar testing reveals good finger-nose-finger bilaterally.  Gait and station: Station is normal.   Gait is normal.  Tandem gait is  mildly wide.  Romberg is negative.  Reflexes: Deep tendon reflexes are symmetric and normal bilaterally.        DIAGNOSTIC DATA (LABS, IMAGING, TESTING) - I reviewed patient records, labs, notes, testing and imaging myself where available.  Lab Results  Component Value Date   WBC 9.6 03/22/2018   HGB 16.3 (H) 03/22/2018   HCT 48.3 (H) 03/22/2018   MCV 91 03/22/2018   PLT 277 03/22/2018      Component Value Date/Time   NA 143 03/22/2018 1316   K 3.8 03/22/2018 1316   CL 104 03/22/2018 1316   CO2 24 03/22/2018 1316   GLUCOSE 116 (H) 03/22/2018 1316   GLUCOSE 98 07/07/2016 1525   BUN 10 03/22/2018 1316   CREATININE 0.86 03/22/2018 1316   CREATININE 0.76 07/07/2016 1525   CALCIUM 9.3 03/22/2018 1316   PROT 6.8 03/22/2018 1316   ALBUMIN 4.5 03/22/2018 1316   AST 13 03/22/2018 1316   ALT 12 03/22/2018 1316   ALKPHOS 102 03/22/2018 1316   BILITOT 0.4 03/22/2018 1316   GFRNONAA 81 03/22/2018 1316   GFRAA 94 03/22/2018 1316   Lab Results  Component Value Date   CHOL 199 01/22/2017   HDL 46 01/22/2017   LDLCALC 116 (H) 01/22/2017   TRIG 187 (H) 01/22/2017   CHOLHDL 4.3 01/22/2017   Lab Results  Component Value Date   HGBA1C 5.5 03/22/2018   Lab Results  Component Value Date   VITAMINB12 283 09/15/2016  Lab Results  Component Value Date   TSH 1.230 03/22/2018       ASSESSMENT AND PLAN    1. Chronic migraine   2. Cervical radiculopathy   3. Lumbar degenerative disc disease   4. Left shoulder pain, unspecified chronicity   5. Neck pain   6. Trochanteric bursitis, left hip   7. Facet syndrome, lumbar      1.   Right C6, C7, T1 paraspinal and trapezius TPI with 40 mg Depo-Medrol in 3 cc Lidocaine using sterile technique.   She tolerated the procedures well with no complications.     2.   Continue Ajovy for chronic migraine.     3.   MRI lumbar spine for LBP and possible facet syndrome 4.   MRI cervical spine for radiculopathy (into left shoulder)  5.    Inject left trochanteric bursa with 40 mg Depomedrol in 3 cc Lidocaine using sterile technique.  6.    Return to see me in 6 months and call sooner if she has significant new or worsening symptoms    Michi Herrmann A. Felecia Shelling, MD, PhD 9/59/7471, 85:50 AM Certified in Neurology, Clinical Neurophysiology, Sleep Medicine, Pain Medicine and Neuroimaging  [facility] [address] [place], [place] [address] [phone]

## 2020-05-24 NOTE — Telephone Encounter (Signed)
Approved today °Your request has been approved ° °[facility] aware  °

## 2020-05-31 ENCOUNTER — Other Ambulatory Visit: Payer: Self-pay | Admitting: Nurse Practitioner

## 2020-05-31 DIAGNOSIS — J309 Allergic rhinitis, unspecified: Secondary | ICD-10-CM

## 2020-06-01 NOTE — Telephone Encounter (Signed)
The MRI Cervical & Lumbar spine was not approved by [facility].  "based on medicare guidelines pain neck pain without and with neurological features (including stenosis) we cannot approve this request. Your records show that you have neck pain. Also  Lower extremity pain with neurological features (radiculopahty, radiculitis, or plexopathy and neuropathy) we or without low back pain. We cannot approve this request. You have no complete six weeks of treatment directed by your doctor. There is no recorder of follow up contact (in person, by phone, by mail or by messaging) with your doctor after completed treatment. There is no record that you completed conservative treatment within the last three months"  There is an option for a peer to peer. The phone number is [phone] and the case number is [id number]. It does need to be scheduled and tomorrow is the last day for it to be scheduled by.

## 2020-06-03 ENCOUNTER — Encounter: Payer: Self-pay | Admitting: Neurology

## 2020-06-03 NOTE — Telephone Encounter (Signed)
[name] Member ID:  [id number]  Attention [employer] Medicare part C,  Appeals and Aggrievances  Standard appeal  [phone]  To whom it may concern:  Judy Evans is a 49 y.o. woman with worsening neck and back pain. MRI of the cervical and lumbar spine was requested but was denied. I am writing to appeal your decision. I was asked to provide the reason that the MRIs were requested.  She has neck pain that is radiating to the left shoulder. She has failed conservative therapy including ongoing treatment with gabapentin, amitriptyline and ibuprofen. Trigger point injections did not help her. Previous MRI of the cervical spine from 2019 had shown degenerative changes at C3-C4, C4-C5 and C5-C6. I am concerned that these degenerative changes have worsened causing a radiculopathy. Depending on the findings of the MRI, more definitive treatment with either epidural steroid injection or referral to neurosurgery will be considered.  She has lower back pain that is mostly axial with some pain going into the left hip. She has known degenerative changes at L4-L5 and L5-S1 including disc bulges and facet hypertrophy seen on MRI in 2016. Her pain has failed conservative treatment with medications including gabapentin, amitriptyline and ibuprofen. In the past she has had epidural steroid injections. The MRI was ordered the source of her pain. Depending on results, she may need to be referred for medial branch block/radiofrequency ablation (if facet hypertrophy has significantly worsened), ESI if evidence of radiculopathy or neurosurgery.  Please let me know if additional information is needed. I have included the last office note.  Yuji Walth A. Felecia Shelling, MD, PhD, FAAN Certified in Neurology, Clinical Neurophysiology, Sleep Medicine, Pain Medicine and Neuroimaging Director, [place] [facility] [address], [place], [place] [address] [phone]

## 2020-06-07 NOTE — Telephone Encounter (Signed)
I sent the following in to appeal

## 2020-06-07 NOTE — Telephone Encounter (Signed)
Noted, thank you

## 2020-06-08 ENCOUNTER — Telehealth: Payer: Self-pay | Admitting: Neurology

## 2020-06-08 NOTE — Telephone Encounter (Signed)
Pt called, [employer] denied request for MRI because have not completed 6 weeks of treatment. Would like a call from the nurse

## 2020-06-08 NOTE — Telephone Encounter (Addendum)
Called pt to let her know Dr. Felecia Shelling sent appeal letter on 06/03/20 to her insurance to try and overturn [place] denial for MRI. She is currently scheduled for MRI's beginning of October. Advised I will call this week to check on status of appeal and then we will follow up with her. She verbalized understanding.   I called [employer] at [phone] to check on status of fast appeal. Spoke with [place]. Appeal was overturned and MRI's approved: Appeal approved 06/07/20-12/06/19. SIDX#[id number]. Appeal [phone]. I called pt and informed her of this. She verbalized understanding.

## 2020-06-08 NOTE — Telephone Encounter (Signed)
Noted, thank you

## 2020-06-10 NOTE — Telephone Encounter (Signed)
Scheduled at GI for 06/30/20.

## 2020-06-30 ENCOUNTER — Ambulatory Visit
Admission: RE | Admit: 2020-06-30 | Discharge: 2020-06-30 | Disposition: A | Discharge status: Home / Self Care | Payer: Medicare HMO | Source: Ambulatory Visit | Attending: Neurology | Admitting: Neurology

## 2020-06-30 DIAGNOSIS — M47816 Spondylosis without myelopathy or radiculopathy, lumbar region: Secondary | ICD-10-CM | POA: Diagnosis not present

## 2020-06-30 DIAGNOSIS — M542 Cervicalgia: Secondary | ICD-10-CM

## 2020-06-30 DIAGNOSIS — M5136 Other intervertebral disc degeneration, lumbar region: Secondary | ICD-10-CM

## 2020-06-30 DIAGNOSIS — M25512 Pain in left shoulder: Secondary | ICD-10-CM

## 2020-06-30 DIAGNOSIS — M5412 Radiculopathy, cervical region: Secondary | ICD-10-CM | POA: Diagnosis not present

## 2020-07-06 DIAGNOSIS — Z79899 Other long term (current) drug therapy: Secondary | ICD-10-CM | POA: Diagnosis not present

## 2020-07-06 DIAGNOSIS — L409 Psoriasis, unspecified: Secondary | ICD-10-CM | POA: Diagnosis not present

## 2020-08-04 ENCOUNTER — Other Ambulatory Visit: Payer: Self-pay | Admitting: Nurse Practitioner

## 2020-08-04 DIAGNOSIS — J449 Chronic obstructive pulmonary disease, unspecified: Secondary | ICD-10-CM

## 2020-08-04 DIAGNOSIS — J309 Allergic rhinitis, unspecified: Secondary | ICD-10-CM

## 2020-08-16 ENCOUNTER — Ambulatory Visit: Payer: Medicare HMO | Admitting: Podiatry

## 2020-08-26 ENCOUNTER — Other Ambulatory Visit: Payer: Self-pay | Admitting: Family

## 2020-08-26 DIAGNOSIS — M79662 Pain in left lower leg: Secondary | ICD-10-CM

## 2020-09-09 DIAGNOSIS — L409 Psoriasis, unspecified: Secondary | ICD-10-CM | POA: Diagnosis not present

## 2020-09-09 DIAGNOSIS — Z79899 Other long term (current) drug therapy: Secondary | ICD-10-CM | POA: Diagnosis not present

## 2020-09-26 ENCOUNTER — Other Ambulatory Visit: Payer: Self-pay | Admitting: Family

## 2020-09-26 DIAGNOSIS — M79662 Pain in left lower leg: Secondary | ICD-10-CM

## 2020-09-27 ENCOUNTER — Telehealth: Payer: Self-pay | Admitting: *Deleted

## 2020-09-27 NOTE — Telephone Encounter (Signed)
[place]. NTBS 03 days given 08/26/20

## 2020-09-27 NOTE — Telephone Encounter (Signed)
Submitted PA Ajovy on CMM. Key: [id number]. Waiting on determination from [facility].

## 2020-09-28 ENCOUNTER — Other Ambulatory Visit: Payer: Self-pay

## 2020-09-28 ENCOUNTER — Ambulatory Visit (INDEPENDENT_AMBULATORY_CARE_PROVIDER_SITE_OTHER): Payer: Medicare HMO | Admitting: Podiatry

## 2020-09-28 ENCOUNTER — Encounter: Payer: Self-pay | Admitting: Podiatry

## 2020-09-28 DIAGNOSIS — G609 Hereditary and idiopathic neuropathy, unspecified: Secondary | ICD-10-CM

## 2020-09-28 DIAGNOSIS — M79674 Pain in right toe(s): Secondary | ICD-10-CM

## 2020-09-28 DIAGNOSIS — M79675 Pain in left toe(s): Secondary | ICD-10-CM

## 2020-09-28 DIAGNOSIS — B351 Tinea unguium: Secondary | ICD-10-CM

## 2020-09-28 NOTE — Telephone Encounter (Signed)
PA approved 09/25/20-09/24/21 as non formulary via [employer]

## 2020-09-29 ENCOUNTER — Other Ambulatory Visit: Payer: Self-pay | Admitting: Neurology

## 2020-09-30 NOTE — Progress Notes (Signed)
Subjective: Judy Evans is a 50 y.o. female patient seen today at [facility] with history of peripheral neuropathy and painful mycotic nails b/l that are difficult to trim. Pain interferes with ambulation. Aggravating factors include wearing enclosed shoe gear. Pain is relieved with periodic professional debridement   Patient states right great toe is tender. She denies any redness drainage or swelling of digit..  Patient Active Problem List   Diagnosis Date Noted  . Insomnia 11/19/2018  . Intractable headache 07/18/2018  . Occipital neuralgia 07/18/2018  . Family history of cerebral aneurysm 07/18/2018  . Numbness 04/23/2018  . Cervical radiculopathy 04/23/2018  . Encounter for long-term use of opiate analgesic 04/22/2018  . Lumbar degenerative disc disease 04/22/2018  . Spondylosis of lumbar spine 04/22/2018  . Fibromyalgia affecting multiple sites 04/22/2018  . Constipation due to pain medication 04/30/2017  . Depression 03/22/2017  . Trochanteric bursitis of left hip 03/13/2017  . Neck pain 11/13/2016  . Left shoulder pain 11/13/2016  . Chronic bilateral low back pain 11/13/2016  . History of colonic polyps 06/20/2016  . IBS (irritable bowel syndrome) 06/20/2016  . Epigastric pain 06/20/2016  . Rhinitis, chronic 04/13/2016  . Otalgia of both ears 04/13/2016  . Arthralgia of right temporomandibular joint 04/13/2016  . BMI 40.0-44.9, adult ([place]) 11/22/2015  . Bilateral carpal tunnel syndrome 11/22/2015  . Tobacco abuse 11/22/2015  . Allergic rhinitis 11/01/2015  . GERD (gastroesophageal reflux disease) 11/01/2015  . LLQ pain 06/18/2015  . Bloating 06/18/2015  . Pain with urination 02/19/2015  . [place] 01/22/2015  . Pelvic pain in female 01/13/2015  . Hot flashes due to surgical menopause 01/13/2015  . Pre-diabetes 01/13/2015  . Hypertension 01/13/2015  . Chronic migraine 01/13/2015  . Psoriasis 01/13/2015  . Arthritis 01/13/2015  . Fibromyalgia 01/13/2015  .  COPD (chronic obstructive pulmonary disease) ([place]) 01/13/2015  . Hyperlipidemia 01/13/2015  . Abnormal nuclear stress test 01/07/2014  . Abdominal pain 04/16/2013  . Pain in joint involving multiple sites 02/20/2013  . Head trauma 02/20/2013    Current Outpatient Medications on File Prior to Visit  Medication Sig Dispense Refill  . acetaminophen (TYLENOL) 500 MG tablet Take 1,000 mg by mouth every 6 (six) hours as needed for mild pain.    Marland Kitchen albuterol (VENTOLIN HFA) 108 (90 Base) MCG/ACT inhaler USE 2 PUFFS BY MOUTH EVERY 4 HOURS AS NEEDED FOR WHEEZING OR SHORTNESS OF BREATH 18 each 2  . amitriptyline (ELAVIL) 25 MG tablet TAKE 1 TO 2 TABLETS BY MOUTH AT BEDTIME 180 tablet 3  . BREO ELLIPTA 100-25 MCG/INH AEPB TAKE 1 PUFF BY MOUTH EVERY DAY 60 each 5  . cetirizine (ZYRTEC) 10 MG tablet Take 10 mg by mouth daily.    . COSENTYX SENSOREADY, 300 MG, 150 MG/ML SOAJ     . cyclobenzaprine (FLEXERIL) 5 MG tablet Take 1 tablet (5 mg total) by mouth 3 (three) times daily as needed for muscle spasms. 30 tablet 0  . doxycycline (VIBRA-TABS) 100 MG tablet Take 1 tablet (100 mg total) by mouth 2 (two) times daily. 20 tablet 0  . fluticasone (FLONASE) 50 MCG/ACT nasal spray SPRAY 2 SPRAYS INTO EACH NOSTRIL EVERY DAY 48 mL 0  . gabapentin (NEURONTIN) 800 MG tablet Take 1 tablet (800 mg total) by mouth 4 (four) times daily. (Needs to be seen before next refill) 120 tablet 0  . ibuprofen (ADVIL,MOTRIN) 600 MG tablet Take 1 tablet (600 mg total) by mouth every 8 (eight) hours as needed. 15 tablet 0  .  lidocaine (LIDODERM) 5 % PLACE 1 PATCH ONTO THE SKIN DAILY. REMOVE & DISCARD PATCH WITHIN 12 HOURS OR AS DIRECTED BY MD 30 patch 2  . loratadine (CLARITIN) 10 MG tablet Take 1 tablet (10 mg total) by mouth daily. 30 tablet 11  . meclizine (ANTIVERT) 25 MG tablet TAKE 1 TABLET (25 MG TOTAL) BY MOUTH 3 (THREE) TIMES DAILY AS NEEDED FOR DIZZINESS. 90 tablet 1  . nystatin (MYCOSTATIN) 100000 UNIT/ML suspension Take 1  teaspoon, swish, gargle for 1 minute and then spit up 4 times a day until symptoms improve. 60 mL 1  . nystatin ointment (MYCOSTATIN) APPLY 1 APPLICATION TOPICALLY 2 (TWO) TIMES DAILY AS NEEDED. 30 g 0  . omeprazole (PRILOSEC) 40 MG capsule TAKE 1 CAPSULE BY MOUTH EVERY DAY (NEEDS TO BE SEEN BEFORE NEXT REFILL) 30 capsule 0  . SUMAtriptan (IMITREX) 100 MG tablet Take 1 tablet (100 mg total) by mouth once as needed for up to 1 dose for migraine. May repeat in 2 hours if headache persists or recurs. 10 tablet 2  . triamcinolone cream (KENALOG) 0.1 %     . triamcinolone ointment (KENALOG) 0.5 % Apply topically 2 (two) times daily.     No current facility-administered medications on file prior to visit.    Allergies  Allergen Reactions  . Rosuvastatin Calcium Swelling    Ends up in the hospital  . Statins Swelling    Ends up in the hospital  . Wellbutrin [Bupropion]     suicidal  . Hctz [Hydrochlorothiazide] Nausea And Vomiting and Rash  . Hydrocodone-Acetaminophen Rash  . Oxycodone Rash  . Penicillins Nausea And Vomiting and Rash    Has patient had a PCN reaction causing immediate rash, facial/tongue/throat swelling, SOB or lightheadedness with hypotension: Yes Has patient had a PCN reaction causing severe rash involving mucus membranes or skin necrosis: No Has patient had a PCN reaction that required hospitalization No Has patient had a PCN reaction occurring within the last 10 years: No If all of the above answers are "NO", then may proceed with Cephalosporin use.   Objective: Physical Exam  General: 50 y.o. pleasant Caucasian female in no acute distress day.  AAO x 3.   Neurovascular status unchanged b/l.  Capillary refill time to digits immediate b/l. Palpable DP pulses b/l. Palpable PT pulses b/l. Pedal hair present b/l. Skin temperature gradient within normal limits b/l.   Pt has subjective symptoms of neuropathy. Protective sensation intact 5/5 intact bilaterally with 10g  monofilament b/l. Vibratory sensation intact b/l. Proprioception intact bilaterally. Babinski reflex negative b/l. Clonus negative b/l.  Dermatological:  Pedal skin with normal turgor, texture and tone bilaterally. No open wounds bilaterally. No interdigital macerations bilaterally. Toenails 1-5 b/l elongated, discolored, dystrophic, thickened, crumbly with subungual debris and tenderness to dorsal palpation. Incurvated nailplate medial border(s) R hallux.  Nail border hypertrophy minimal. There is tenderness to palpation. Sign(s) of infection: no clinical signs of infection noted on examination today.  Musculoskeletal:  Normal muscle strength 5/5 to all lower extremity muscle groups bilaterally. No gross bony deformities bilaterally. No pain crepitus or joint limitation noted with ROM b/l. Patient ambulates independent of any assistive aids. Right ankle with no edema, no erythema, no eccchymosis.  Assessment and Plan:  1. Pain due to onychomycosis of toenails of both feet   2. Neuropathy, peripheral, idiopathic    -Examined patient. -Toenails 1-5 b/l were debrided in length and girth with sterile nail nippers and dremel without iatrogenic bleeding.  -Offending nail borders  debrided and curretaged R hallux. Borders cleansed with alcohol. Antibiotic ointment applied. She was instructed to apply Neosporin Cream to digit once daily for one week. -Patient to report any pedal injuries to medical professional immediately. -Applied ace bandage for right ankle on today. Patient noted relief. She has walking boot at home and wears prn. -Patient to continue soft, supportive shoe gear daily. -Patient/POA to call should there be question/concern in the interim.  Return in about 3 months (around 12/27/2020).  Freddie Breech, DPM

## 2020-10-26 DIAGNOSIS — Z79899 Other long term (current) drug therapy: Secondary | ICD-10-CM | POA: Diagnosis not present

## 2020-10-26 DIAGNOSIS — L409 Psoriasis, unspecified: Secondary | ICD-10-CM | POA: Diagnosis not present

## 2020-10-31 ENCOUNTER — Other Ambulatory Visit: Payer: Self-pay | Admitting: Neurology

## 2020-11-02 ENCOUNTER — Other Ambulatory Visit: Payer: Self-pay | Admitting: Nurse Practitioner

## 2020-11-02 DIAGNOSIS — J309 Allergic rhinitis, unspecified: Secondary | ICD-10-CM

## 2020-11-11 ENCOUNTER — Telehealth: Payer: Self-pay

## 2020-11-11 NOTE — Telephone Encounter (Signed)
Fine with me. I have only seen her 1 x for neck pain

## 2020-11-12 NOTE — Telephone Encounter (Signed)
Pcp changed appt made

## 2020-11-12 NOTE — Telephone Encounter (Signed)
okay

## 2020-11-15 ENCOUNTER — Other Ambulatory Visit: Payer: Self-pay

## 2020-11-15 ENCOUNTER — Encounter: Payer: Self-pay | Admitting: Nurse Practitioner

## 2020-11-15 ENCOUNTER — Ambulatory Visit (INDEPENDENT_AMBULATORY_CARE_PROVIDER_SITE_OTHER): Payer: Medicare HMO | Admitting: Nurse Practitioner

## 2020-11-15 VITALS — BP 133/78 | HR 88 | Temp 97.8°F | Ht 62.0 in | Wt 198.0 lb

## 2020-11-15 DIAGNOSIS — J449 Chronic obstructive pulmonary disease, unspecified: Secondary | ICD-10-CM

## 2020-11-15 DIAGNOSIS — M79662 Pain in left lower leg: Secondary | ICD-10-CM | POA: Diagnosis not present

## 2020-11-15 DIAGNOSIS — B370 Candidal stomatitis: Secondary | ICD-10-CM | POA: Diagnosis not present

## 2020-11-15 DIAGNOSIS — K219 Gastro-esophageal reflux disease without esophagitis: Secondary | ICD-10-CM | POA: Diagnosis not present

## 2020-11-15 DIAGNOSIS — M5441 Lumbago with sciatica, right side: Secondary | ICD-10-CM

## 2020-11-15 MED ORDER — GABAPENTIN 800 MG PO TABS
800.0000 mg | ORAL_TABLET | Freq: Four times a day (QID) | ORAL | 2 refills | Status: DC
Start: 2020-11-15 — End: 2021-03-15

## 2020-11-15 MED ORDER — OMEPRAZOLE 40 MG PO CPDR: DELAYED_RELEASE_CAPSULE | ORAL | 0 refills | Status: DC

## 2020-11-15 MED ORDER — IPRATROPIUM-ALBUTEROL 0.5-2.5 (3) MG/3ML IN SOLN: 3.0000 mL | Freq: Four times a day (QID) | RESPIRATORY_TRACT | 0 refills | Status: DC | PRN

## 2020-11-15 MED ORDER — NYSTATIN 100000 UNIT/ML MT SUSP: OROMUCOSAL | 1 refills | Status: DC

## 2020-11-15 MED ORDER — LIDOCAINE 5 % EX PTCH: 1.0000 | MEDICATED_PATCH | CUTANEOUS | 2 refills | Status: DC

## 2020-11-15 NOTE — Patient Instructions (Addendum)
Make an appointment for CPE with labs and mammogram on your next office visit.  BMI for Adults What is BMI? Body mass index (BMI) is a number that is calculated from a person's weight and height. BMI can help estimate how much of a person's weight is composed of fat. BMI does not measure body fat directly. Rather, it is an alternative to procedures that directly measure body fat, which can be difficult and expensive. BMI can help identify people who may be at higher risk for certain medical problems. What are BMI measurements used for? BMI is used as a screening tool to identify possible weight problems. It helps determine whether a person is obese, overweight, a healthy weight, or underweight. BMI is useful for:  Identifying a weight problem that may be related to a medical condition or may increase the risk for medical problems.  Promoting changes, such as changes in diet and exercise, to help reach a healthy weight. BMI screening can be repeated to see if these changes are working. How is BMI calculated? BMI involves measuring your weight in relation to your height. Both height and weight are measured, and the BMI is calculated from those numbers. This can be done either in [place] ([place] or metric measurements. Note that charts and online BMI calculators are available to help you find your BMI quickly and easily without having to do these calculations yourself. To calculate your BMI in [place] ([place] measurements: 1. Measure your weight in pounds (lb). 2. Multiply the number of pounds by 703. ? For example, for a person who weighs 180 lb, multiply that number by 703, which equals 126,540. 3. Measure your height in inches. Then multiply that number by itself to get a measurement called "inches squared." ? For example, for a person who is 70 inches tall, the "inches squared" measurement is 70 inches x 70 inches, which equals 4,900 inches squared. 4. Divide the total from step 2 (number of lb  x 703) by the total from step 3 (inches squared): 126,540  4,900 = 25.8. This is your BMI.   To calculate your BMI in metric measurements: 1. Measure your weight in kilograms (kg). 2. Measure your height in meters (m). Then multiply that number by itself to get a measurement called "meters squared." ? For example, for a person who is 1.75 m tall, the "meters squared" measurement is 1.75 m x 1.75 m, which is equal to 3.1 meters squared. 3. Divide the number of kilograms (your weight) by the meters squared number. In this example: 70  3.1 = 22.6. This is your BMI. What do the results mean? BMI charts are used to identify whether you are underweight, normal weight, overweight, or obese. The following guidelines will be used:  Underweight: BMI less than 18.5.  Normal weight: BMI between 18.5 and 24.9.  Overweight: BMI between 25 and 29.9.  Obese: BMI of 30 or above. Keep these notes in mind:  Weight includes both fat and muscle, so someone with a muscular build, such as an athlete, may have a BMI that is higher than 24.9. In cases like these, BMI is not an accurate measure of body fat.  To determine if excess body fat is the cause of a BMI of 25 or higher, further assessments may need to be done by a health care provider.  BMI is usually interpreted in the same way for men and women. Where to find more information For more information about BMI, including tools to quickly calculate  your BMI, go to these websites:  Centers for Disease Control and Prevention: http://www.wolf.info/  American Heart Association: www.heart.org  National Heart, Lung, and Blood Institute: https://wilson-eaton.com/ Summary  Body mass index (BMI) is a number that is calculated from a person's weight and height.  BMI may help estimate how much of a person's weight is composed of fat. BMI can help identify those who may be at higher risk for certain medical problems.  BMI can be measured using English measurements or metric  measurements.  BMI charts are used to identify whether you are underweight, normal weight, overweight, or obese. This information is not intended to replace advice given to you by your health care provider. Make sure you discuss any questions you have with your health care provider. Document Revised: 06/04/2019 Document Reviewed: 04/11/2019 Elsevier Patient Education  2021 [place]. Smoking Tobacco Information, Adult Smoking tobacco can be harmful to your health. Tobacco contains a poisonous (toxic), colorless chemical called nicotine. Nicotine is addictive. It changes the brain and can make it hard to stop smoking. Tobacco also has other toxic chemicals that can hurt your body and raise your risk of many cancers. How can smoking tobacco affect me? Smoking tobacco puts you at risk for:  Cancer. Smoking is most commonly associated with lung cancer, but can also lead to cancer in other parts of the body.  Chronic obstructive pulmonary disease (COPD). This is a long-term lung condition that makes it hard to breathe. It also gets worse over time.  High blood pressure (hypertension), heart disease, stroke, or heart attack.  Lung infections, such as pneumonia.  Cataracts. This is when the lenses in the eyes become clouded.  Digestive problems. This may include peptic ulcers, heartburn, and gastroesophageal reflux disease (GERD).  Oral health problems, such as gum disease and tooth loss.  Loss of taste and smell. Smoking can affect your appearance by causing:  Wrinkles.  Yellow or stained teeth, fingers, and fingernails. Smoking tobacco can also affect your social life, because:  It may be challenging to find places to smoke when away from home. Many workplaces, [employer], hotels, and public places are tobacco-free.  Smoking is expensive. This is due to the cost of tobacco and the long-term costs of treating health problems from smoking.  Secondhand smoke may affect those around  you. Secondhand smoke can cause lung cancer, breathing problems, and heart disease. Children of smokers have a higher risk for: ? Sudden infant death syndrome (SIDS). ? Ear infections. ? Lung infections. If you currently smoke tobacco, quitting now can help you:  Lead a longer and healthier life.  Look, smell, breathe, and feel better over time.  Save money.  Protect others from the harms of secondhand smoke. What actions can I take to prevent health problems? Quit smoking  Do not start smoking. Quit if you already do.  Make a plan to quit smoking and commit to it. Look for programs to help you and ask your health care provider for recommendations and ideas.  Set a date and write down all the reasons you want to quit.  Let your friends and family know you are quitting so they can help and support you. Consider finding friends who also want to quit. It can be easier to quit with someone else, so that you can support each other.  Talk with your health care provider about using nicotine replacement medicines to help you quit, such as gum, lozenges, patches, sprays, or pills.  Do not replace cigarette smoking  with electronic cigarettes, which are commonly called e-cigarettes. The safety of e-cigarettes is not known, and some may contain harmful chemicals.  If you try to quit but return to smoking, stay positive. It is common to slip up when you first quit, so take it one day at a time.  Be prepared for cravings. When you feel the urge to smoke, chew gum or suck on hard candy.   Lifestyle  Stay busy and take care of your body.  Drink enough fluid to keep your urine pale yellow.  Get plenty of exercise and eat a healthy diet. This can help prevent weight gain after quitting.  Monitor your eating habits. Quitting smoking can cause you to have a larger appetite than when you smoke.  Find ways to relax. Go out with friends or family to a movie or a restaurant where people do not  smoke.  Ask your health care provider about having regular tests (screenings) to check for cancer. This may include blood tests, imaging tests, and other tests.  Find ways to manage your stress, such as meditation, yoga, or exercise. Where to find support To get support to quit smoking, consider:  Asking your health care provider for more information and resources.  Taking classes to learn more about quitting smoking.  Looking for local organizations that offer resources about quitting smoking.  Joining a support group for people who want to quit smoking in your local community.  Calling the smokefree.gov counselor helpline: 1-800-Quit-Now 7202416174) Where to find more information You may find more information about quitting smoking from:  HelpGuide.org: www.helpguide.org  https://hall.com/: smokefree.gov  American Lung Association: www.lung.org Contact a health care provider if you:  Have problems breathing.  Notice that your lips, nose, or fingers turn blue.  Have chest pain.  Are coughing up blood.  Feel faint or you pass out.  Have other health changes that cause you to worry. Summary  Smoking tobacco can negatively affect your health, the health of those around you, your finances, and your social life.  Do not start smoking. Quit if you already do. If you need help quitting, ask your health care provider.  Think about joining a support group for people who want to quit smoking in your local community. There are many effective programs that will help you to quit this behavior. This information is not intended to replace advice given to you by your health care provider. Make sure you discuss any questions you have with your health care provider. Document Revised: 06/06/2019 Document Reviewed: 09/26/2016 Elsevier Patient Education  2021 [place]. Sciatica  Sciatica is pain, numbness, weakness, or tingling along the path of the sciatic nerve. The sciatic  nerve starts in the lower back and runs down the back of each leg. The nerve controls the muscles in the lower leg and in the back of the knee. It also provides feeling (sensation) to the back of the thigh, the lower leg, and the sole of the foot. Sciatica is a symptom of another medical condition that pinches or puts pressure on the sciatic nerve. Sciatica most often only affects one side of the body. Sciatica usually goes away on its own or with treatment. In some cases, sciatica may come back (recur). What are the causes? This condition is caused by pressure on the sciatic nerve or pinching of the nerve. This may be the result of:  A disk in between the bones of the spine bulging out too far (herniated disk).  Age-related changes  in the spinal disks.  A pain disorder that affects a muscle in the buttock.  Extra bone growth near the sciatic nerve.  A break (fracture) of the pelvis.  Pregnancy.  Tumor. This is rare. What increases the risk? The following factors may make you more likely to develop this condition:  Playing sports that place pressure or stress on the spine.  Having poor strength and flexibility.  A history of back injury or surgery.  Sitting for long periods of time.  Doing activities that involve repetitive bending or lifting.  Obesity. What are the signs or symptoms? Symptoms can vary from mild to very severe, and they may include:  Any of these problems in the lower back, leg, hip, or buttock: ? Mild tingling, numbness, or dull aches. ? Burning sensations. ? Sharp pains.  Numbness in the back of the calf or the sole of the foot.  Leg weakness.  Severe back pain that makes movement difficult. Symptoms may get worse when you cough, sneeze, or laugh, or when you sit or stand for long periods of time. How is this diagnosed? This condition may be diagnosed based on:  Your symptoms and medical history.  A physical exam.  Blood tests.  Imaging tests,  such as: ? X-rays. ? MRI. ? CT scan. How is this treated? In many cases, this condition improves on its own without treatment. However, treatment may include:  Reducing or modifying physical activity.  Exercising and stretching.  Icing and applying heat to the affected area.  Medicines that help to: ? Relieve pain and swelling. ? Relax your muscles.  Injections of medicines that help to relieve pain, irritation, and inflammation around the sciatic nerve (steroids).  Surgery. Follow these instructions at home: Medicines  Take over-the-counter and prescription medicines only as told by your health care provider.  Ask your health care provider if the medicine prescribed to you: ? Requires you to avoid driving or using heavy machinery. ? Can cause constipation. You may need to take these actions to prevent or treat constipation:  Drink enough fluid to keep your urine pale yellow.  Take over-the-counter or prescription medicines.  Eat foods that are high in fiber, such as beans, whole grains, and fresh fruits and vegetables.  Limit foods that are high in fat and processed sugars, such as fried or sweet foods. Managing pain  If directed, put ice on the affected area. ? Put ice in a plastic bag. ? Place a towel between your skin and the bag. ? Leave the ice on for 20 minutes, 2-3 times a day.  If directed, apply heat to the affected area. Use the heat source that your health care provider recommends, such as a moist heat pack or a heating pad. ? Place a towel between your skin and the heat source. ? Leave the heat on for 20-30 minutes. ? Remove the heat if your skin turns bright red. This is especially important if you are unable to feel pain, heat, or cold. You may have a greater risk of getting burned.      Activity  Return to your normal activities as told by your health care provider. Ask your health care provider what activities are safe for you.  Avoid activities  that make your symptoms worse.  Take brief periods of rest throughout the day. ? When you rest for longer periods, mix in some mild activity or stretching between periods of rest. This will help to prevent stiffness and pain. ? Avoid  sitting for long periods of time without moving. Get up and move around at least one time each hour.  Exercise and stretch regularly, as told by your health care provider.  Do not lift anything that is heavier than 10 lb (4.5 kg) while you have symptoms of sciatica. When you do not have symptoms, you should still avoid heavy lifting, especially repetitive heavy lifting.  When you lift objects, always use proper lifting technique, which includes: ? Bending your knees. ? Keeping the load close to your body. ? Avoiding twisting.   General instructions  Maintain a healthy weight. Excess weight puts extra stress on your back.  Wear supportive, comfortable shoes. Avoid wearing high heels.  Avoid sleeping on a mattress that is too soft or too hard. A mattress that is firm enough to support your back when you sleep may help to reduce your pain.  Keep all follow-up visits as told by your health care provider. This is important. Contact a health care provider if:  You have pain that: ? Wakes you up when you are sleeping. ? Gets worse when you lie down. ? Is worse than you have experienced in the past. ? Lasts longer than 4 weeks.  You have an unexplained weight loss. Get help right away if:  You are not able to control when you urinate or have bowel movements (incontinence).  You have: ? Weakness in your lower back, pelvis, buttocks, or legs that gets worse. ? Redness or swelling of your back. ? A burning sensation when you urinate. Summary  Sciatica is pain, numbness, weakness, or tingling along the path of the sciatic nerve.  This condition is caused by pressure on the sciatic nerve or pinching of the nerve.  Sciatica can cause pain, numbness, or  tingling in the lower back, legs, hips, and buttocks.  Treatment often includes rest, exercise, medicines, and applying ice or heat. This information is not intended to replace advice given to you by your health care provider. Make sure you discuss any questions you have with your health care provider. Document Revised: 09/30/2018 Document Reviewed: 09/30/2018 Elsevier Patient Education  [place].

## 2020-11-15 NOTE — Assessment & Plan Note (Signed)
Continue current medication no changes necessary.  Will switch patient's Breo due to side effect of mouth thrush.  Rx sent to pharmacy  Follow-up with unresolved symptoms.

## 2020-11-15 NOTE — Assessment & Plan Note (Signed)
Patient compliant with Prilosec 40 mg tablet capsule daily.  No new symptoms of GERD.  Continue to provide education on healthy eating, tobacco cessation weight loss and exercise.  Rx refill sent to pharmacy

## 2020-11-15 NOTE — Assessment & Plan Note (Signed)
Pain symptoms managed on current medication lidocaine 5%, gabapentin 800 mg tablet daily. Rx sent to pharmacy.

## 2020-11-15 NOTE — Progress Notes (Signed)
Established Patient Office Visit  Subjective:  Patient ID: Judy Evans, female    DOB: Aug 06, 1971  Age: 50 y.o. MRN: [id number]  CC:  Chief Complaint  Patient presents with  . Medication Refill  . Gastroesophageal Reflux  . Pain  . COPD    HPI Judy Evans presents for Pain  She reports chronic bilateral lower leg pain. was not an injury that may have caused the pain. The pain started a few years ago and is staying constant. The pain does not radiate . The pain is described as aching, is moderate in intensity, occurring constantly. Symptoms are worse in the: morning, mid-day, afternoon  Aggravating factors: standing and walking Relieving factors: medication Lidocaine patch, Neurontin.  She has tried prescription pain relievers with moderate relief.     ---------------------------------------------------------------------------------------------------   Judy Evans is a 50 y.o. female who complains of GERD type symptoms. She has been experiencing heartburn for many year(s), chronic over time. ROS: patient denies wheezing, dysphagia or history of GI bleeding. Social history: smoking 1.5 pack  PPD.  COPD, Follow up  She was last seen for this 3 years ago. Changes made include none.   She reports good compliance with treatment. She is having side effects. From Breo (mouth thrush) she uses rescue inhaler 2 per days. She IS experiencing cough and increased sputum. She is NOT experiencing weight increase, chills, fever or colored sputum. she reports breathing is Unchanged.  Pulmonary Functions Testing Results:  No results found for: FEV1, FVC, FEV1FVC, TLC  -----------------------------------------------------------------------------------------   Current Outpatient Medications  Medication Sig Dispense Refill  . acetaminophen (TYLENOL) 500 MG tablet Take 1,000 mg by mouth every 6 (six) hours as needed for mild pain.    Marland Kitchen AJOVY 225 MG/1.5ML SOSY INJECT  1 SYRINGE UNDER THE SKIN EVERY 28 DAYS. 4.5 mL 3  . albuterol (VENTOLIN HFA) 108 (90 Base) MCG/ACT inhaler USE 2 PUFFS BY MOUTH EVERY 4 HOURS AS NEEDED FOR WHEEZING OR SHORTNESS OF BREATH 18 each 2  . amitriptyline (ELAVIL) 25 MG tablet TAKE 1 TO 2 TABLETS BY MOUTH AT BEDTIME 180 tablet 0  . BREO ELLIPTA 100-25 MCG/INH AEPB TAKE 1 PUFF BY MOUTH EVERY DAY 60 each 5  . cetirizine (ZYRTEC) 10 MG tablet Take 10 mg by mouth daily.    . COSENTYX SENSOREADY, 300 MG, 150 MG/ML SOAJ     . cyclobenzaprine (FLEXERIL) 5 MG tablet Take 1 tablet (5 mg total) by mouth 3 (three) times daily as needed for muscle spasms. 30 tablet 0  . doxycycline (VIBRA-TABS) 100 MG tablet Take 1 tablet (100 mg total) by mouth 2 (two) times daily. 20 tablet 0  . fluticasone (FLONASE) 50 MCG/ACT nasal spray SPRAY 2 SPRAYS INTO EACH NOSTRIL EVERY DAY 48 mL 0  . ibuprofen (ADVIL,MOTRIN) 600 MG tablet Take 1 tablet (600 mg total) by mouth every 8 (eight) hours as needed. 15 tablet 0  . ipratropium-albuterol (DUONEB) 0.5-2.5 (3) MG/3ML SOLN Take 3 mLs by nebulization every 6 (six) hours as needed. 360 mL 0  . loratadine (CLARITIN) 10 MG tablet Take 1 tablet (10 mg total) by mouth daily. 30 tablet 11  . meclizine (ANTIVERT) 25 MG tablet TAKE 1 TABLET (25 MG TOTAL) BY MOUTH 3 (THREE) TIMES DAILY AS NEEDED FOR DIZZINESS. 90 tablet 1  . nystatin ointment (MYCOSTATIN) APPLY 1 APPLICATION TOPICALLY 2 (TWO) TIMES DAILY AS NEEDED. 30 g 0  . SUMAtriptan (IMITREX) 100 MG tablet Take 1 tablet (100 mg total) by  mouth once as needed for up to 1 dose for migraine. May repeat in 2 hours if headache persists or recurs. 10 tablet 2  . triamcinolone cream (KENALOG) 0.1 %     . triamcinolone ointment (KENALOG) 0.5 % Apply topically 2 (two) times daily.    Marland Kitchen gabapentin (NEURONTIN) 800 MG tablet Take 1 tablet (800 mg total) by mouth 4 (four) times daily. (Needs to be seen before next refill) 120 tablet 2  . lidocaine (LIDODERM) 5 % Place 1 patch onto the  skin daily. Remove & Discard patch within 12 hours or as directed by MD 30 patch 2  . nystatin (MYCOSTATIN) 100000 UNIT/ML suspension Take 1 teaspoon, swish, gargle for 1 minute and then spit up 4 times a day until symptoms improve. 60 mL 1  . omeprazole (PRILOSEC) 40 MG capsule 1 capsule by mouth daily 90 capsule 0   No current facility-administered medications for this visit.      ASSESSMENT: GERD   PLAN: The pathophysiology of reflux is discussed.  Anti-reflux measures such as raising the head of the bed, avoiding tight clothing or belts, avoiding eating late at night and not lying down shortly after mealtime and achieving weight loss are discussed. Avoid ASA, NSAID's, caffeine, peppermints, alcohol and tobacco. OTC H2 blockers and/or antacids are often very helpful for PRN use. However, for persisting chronic or daily symptoms, prescription strength H2 blockers or a trial of PPI's are often used. Further recommendations to her: Rx for PPI is written. She should alert me if there are persistent symptoms, dysphagia, weight loss or GI bleeding. FUV is scheduled.    Past Medical History:  Diagnosis Date  . Anxiety   . Aphasia   . Arthritis   . COPD (chronic obstructive pulmonary disease) ([place])   . DDD (degenerative disc disease), cervical   . DDD (degenerative disc disease), lumbar   . Degenerative disc disease at L5-S1 level   . Diabetes mellitus without complication ([place])    States she does not have  . Fibromyalgia   . GERD (gastroesophageal reflux disease)   . H. pylori infection   . Hyperlipidemia    States she does not have  . Hypertension    States she does not have  . IBS (irritable bowel syndrome)   . LLQ pain 06/18/2015  . Migraines   . [place] 01/22/2015  . Pain with urination 02/19/2015  . Psoriasis   . Scoliosis   . Spondylolysis     Past Surgical History:  Procedure Laterality Date  . ABDOMINAL HYSTERECTOMY    . BIOPSY  09/14/2016   Procedure: BIOPSY;   Surgeon: Daneil Dolin, MD;  Location: [facility];  Service: Endoscopy;;  gastric   . CARDIAC CATHETERIZATION  2015   patient reported it to be normal. "I was dying in pain during procedure".   . CARPAL TUNNEL RELEASE Right   . CESAREAN SECTION    . CHOLECYSTECTOMY    . COLONOSCOPY  06/2014   Altamese Dilling DeMason: normal  . DILATION AND CURETTAGE OF UTERUS    . ESOPHAGOGASTRODUODENOSCOPY  07/2014   Burke Keels: Normal  . ESOPHAGOGASTRODUODENOSCOPY (EGD) WITH PROPOFOL N/A 09/14/2016   Dr. Gala Romney: Normal esophagus status post empiric dilation, erosive gastropathy with biopsies showing chronic inactive gastritis, no H pylori.  Marland Kitchen FOOT SURGERY Bilateral    removal of heel spurs  . MALONEY DILATION  09/14/2016   Procedure: Venia Minks DILATION;  Surgeon: Daneil Dolin, MD;  Location: [facility];  Service:  Endoscopy;;  . rotator cuff surgery Right   . TONSILLECTOMY      Family History  Problem Relation Age of Onset  . COPD Mother   . Diabetes Mother   . Osteoarthritis Mother   . Atrial fibrillation Mother   . Heart disease Mother   . Healthy Father   . Lupus Sister   . CAD Sister   . Diabetes Sister   . Early death Brother        pneumonia  . Asthma Daughter   . Asthma Son   . Cancer Maternal Grandmother 28       colon  . Arthritis Maternal Grandmother   . Diabetes Maternal Grandmother   . Dementia Maternal Grandmother   . Other Maternal Grandfather        brain tumor  . Cancer Paternal Grandmother        breast  . Cancer Paternal Grandfather        lung  . Other Paternal Grandfather        brain aneurysm  . Factor V Leiden deficiency Sister   . Arthritis Sister   . Psoriasis Sister     Social History   Socioeconomic History  . Marital status: Legally Separated    Spouse name: Not on file  . Number of children: 2  . Years of education: GED  . Highest education level: GED or equivalent  Occupational History  . Occupation: Disabled  Tobacco Use  . Smoking status:  Current Every Day Smoker    Packs/day: 1.50    Years: 30.00    Pack years: 45.00    Types: Cigarettes  . Smokeless tobacco: Never Used  Vaping Use  . Vaping Use: Former  Substance and Sexual Activity  . Alcohol use: No  . Drug use: No  . Sexual activity: Not Currently    Birth control/protection: Surgical    Comment: hyst  Other Topics Concern  . Not on file  Social History Narrative   Caffeine use: Drinks coffee (2 cups per day), tea/soda- 2-3 cups per day   Right handed   Lives alone   Social Determinants of Health   Financial Resource Strain: Not on file  Food Insecurity: Not on file  Transportation Needs: Not on file  Physical Activity: Not on file  Stress: Not on file  Social Connections: Not on file  Intimate Partner Violence: Not on file    Outpatient Medications Prior to Visit  Medication Sig Dispense Refill  . acetaminophen (TYLENOL) 500 MG tablet Take 1,000 mg by mouth every 6 (six) hours as needed for mild pain.    Marland Kitchen AJOVY 225 MG/1.5ML SOSY INJECT 1 SYRINGE UNDER THE SKIN EVERY 28 DAYS. 4.5 mL 3  . albuterol (VENTOLIN HFA) 108 (90 Base) MCG/ACT inhaler USE 2 PUFFS BY MOUTH EVERY 4 HOURS AS NEEDED FOR WHEEZING OR SHORTNESS OF BREATH 18 each 2  . amitriptyline (ELAVIL) 25 MG tablet TAKE 1 TO 2 TABLETS BY MOUTH AT BEDTIME 180 tablet 0  . BREO ELLIPTA 100-25 MCG/INH AEPB TAKE 1 PUFF BY MOUTH EVERY DAY 60 each 5  . cetirizine (ZYRTEC) 10 MG tablet Take 10 mg by mouth daily.    . COSENTYX SENSOREADY, 300 MG, 150 MG/ML SOAJ     . cyclobenzaprine (FLEXERIL) 5 MG tablet Take 1 tablet (5 mg total) by mouth 3 (three) times daily as needed for muscle spasms. 30 tablet 0  . doxycycline (VIBRA-TABS) 100 MG tablet Take 1 tablet (100 mg total) by mouth  2 (two) times daily. 20 tablet 0  . fluticasone (FLONASE) 50 MCG/ACT nasal spray SPRAY 2 SPRAYS INTO EACH NOSTRIL EVERY DAY 48 mL 0  . ibuprofen (ADVIL,MOTRIN) 600 MG tablet Take 1 tablet (600 mg total) by mouth every 8 (eight)  hours as needed. 15 tablet 0  . loratadine (CLARITIN) 10 MG tablet Take 1 tablet (10 mg total) by mouth daily. 30 tablet 11  . meclizine (ANTIVERT) 25 MG tablet TAKE 1 TABLET (25 MG TOTAL) BY MOUTH 3 (THREE) TIMES DAILY AS NEEDED FOR DIZZINESS. 90 tablet 1  . nystatin ointment (MYCOSTATIN) APPLY 1 APPLICATION TOPICALLY 2 (TWO) TIMES DAILY AS NEEDED. 30 g 0  . SUMAtriptan (IMITREX) 100 MG tablet Take 1 tablet (100 mg total) by mouth once as needed for up to 1 dose for migraine. May repeat in 2 hours if headache persists or recurs. 10 tablet 2  . triamcinolone cream (KENALOG) 0.1 %     . triamcinolone ointment (KENALOG) 0.5 % Apply topically 2 (two) times daily.    Marland Kitchen gabapentin (NEURONTIN) 800 MG tablet Take 1 tablet (800 mg total) by mouth 4 (four) times daily. (Needs to be seen before next refill) 120 tablet 0  . lidocaine (LIDODERM) 5 % PLACE 1 PATCH ONTO THE SKIN DAILY. REMOVE & DISCARD PATCH WITHIN 12 HOURS OR AS DIRECTED BY MD 30 patch 2  . nystatin (MYCOSTATIN) 100000 UNIT/ML suspension Take 1 teaspoon, swish, gargle for 1 minute and then spit up 4 times a day until symptoms improve. 60 mL 1  . omeprazole (PRILOSEC) 40 MG capsule TAKE 1 CAPSULE BY MOUTH EVERY DAY (NEEDS TO BE SEEN BEFORE NEXT REFILL) 30 capsule 0   No facility-administered medications prior to visit.    Allergies  Allergen Reactions  . Rosuvastatin Calcium Swelling    Ends up in the hospital  . Statins Swelling    Ends up in the hospital  . Wellbutrin [Bupropion]     suicidal  . Hctz [Hydrochlorothiazide] Nausea And Vomiting and Rash  . Hydrocodone-Acetaminophen Rash  . Oxycodone Rash  . Penicillins Nausea And Vomiting and Rash    Has patient had a PCN reaction causing immediate rash, facial/tongue/throat swelling, SOB or lightheadedness with hypotension: Yes Has patient had a PCN reaction causing severe rash involving mucus membranes or skin necrosis: No Has patient had a PCN reaction that required hospitalization  No Has patient had a PCN reaction occurring within the last 10 years: No If all of the above answers are "NO", then may proceed with Cephalosporin use.     ROS Review of Systems  Constitutional: Negative.   HENT: Negative.   Respiratory: Positive for cough, chest tightness and shortness of breath.   Cardiovascular: Negative for leg swelling.  Gastrointestinal: Negative.   Genitourinary: Negative.   Musculoskeletal: Negative.   Skin: Negative.   Neurological: Negative.   All other systems reviewed and are negative.     Objective:    Physical Exam Vitals reviewed.  Constitutional:      Appearance: Normal appearance.  HENT:     Head: Normocephalic.     Nose: Nose normal.  Eyes:     Conjunctiva/sclera: Conjunctivae normal.  Cardiovascular:     Rate and Rhythm: Normal rate and regular rhythm.     Pulses: Normal pulses.     Heart sounds: Normal heart sounds.  Pulmonary:     Breath sounds: No wheezing.     Comments: Cough, tightness Abdominal:     General: Bowel sounds are normal.  Skin:    General: Skin is dry.  Neurological:     Mental Status: She is alert and oriented to person, place, and time.  Psychiatric:        Behavior: Behavior normal.     BP 133/78   Pulse 88   Temp 97.8 F (36.6 C)   Ht 5\' 2"  (1.575 m)   Wt 198 lb (89.8 kg)   SpO2 95%   BMI 36.21 kg/m  Wt Readings from Last 3 Encounters:  11/15/20 198 lb (89.8 kg)  05/24/20 200 lb 8 oz (90.9 kg)  01/09/20 207 lb (93.9 kg)     Health Maintenance Due  Topic Date Due  . Hepatitis C Screening  Never done  . HIV Screening  Never done    There are no preventive care reminders to display for this patient.  Lab Results  Component Value Date   TSH 1.230 03/22/2018   Lab Results  Component Value Date   WBC 9.6 03/22/2018   HGB 16.3 (H) 03/22/2018   HCT 48.3 (H) 03/22/2018   MCV 91 03/22/2018   PLT 277 03/22/2018   Lab Results  Component Value Date   NA 143 03/22/2018   K 3.8  03/22/2018   CO2 24 03/22/2018   GLUCOSE 116 (H) 03/22/2018   BUN 10 03/22/2018   CREATININE 0.86 03/22/2018   BILITOT 0.4 03/22/2018   ALKPHOS 102 03/22/2018   AST 13 03/22/2018   ALT 12 03/22/2018   PROT 6.8 03/22/2018   ALBUMIN 4.5 03/22/2018   CALCIUM 9.3 03/22/2018   Lab Results  Component Value Date   CHOL 199 01/22/2017   Lab Results  Component Value Date   HDL 46 01/22/2017   Lab Results  Component Value Date   LDLCALC 116 (H) 01/22/2017   Lab Results  Component Value Date   TRIG 187 (H) 01/22/2017   Lab Results  Component Value Date   CHOLHDL 4.3 01/22/2017   Lab Results  Component Value Date   HGBA1C 5.5 03/22/2018      Assessment & Plan:   Problem List Items Addressed This Visit      Respiratory   COPD (chronic obstructive pulmonary disease) ([place])    Continue current medication no changes necessary.  Will switch patient's Breo due to side effect of mouth thrush.  Rx sent to pharmacy  Follow-up with unresolved symptoms.      Relevant Medications   ipratropium-albuterol (DUONEB) 0.5-2.5 (3) MG/3ML SOLN   Other Relevant Orders   For home use only DME Nebulizer machine     Digestive   Gastroesophageal reflux disease    Patient compliant with Prilosec 40 mg tablet capsule daily.  No new symptoms of GERD.  Continue to provide education on healthy eating, tobacco cessation weight loss and exercise.  Rx refill sent to pharmacy      Relevant Medications   omeprazole (PRILOSEC) 40 MG capsule   Thrush   Relevant Medications   nystatin (MYCOSTATIN) 100000 UNIT/ML suspension     Nervous and Auditory   Bilateral low back pain with right-sided sciatica   Relevant Medications   gabapentin (NEURONTIN) 800 MG tablet   lidocaine (LIDODERM) 5 %     Other   Pain of left lower leg - Primary    Pain symptoms managed on current medication lidocaine 5%, gabapentin 800 mg tablet daily. Rx sent to pharmacy.       Relevant Medications   gabapentin  (NEURONTIN) 800 MG tablet  Follow-up: Return in about 3 months (around 02/12/2021).    Ivy Lynn, NP

## 2020-11-17 ENCOUNTER — Telehealth: Payer: Self-pay | Admitting: *Deleted

## 2020-11-17 ENCOUNTER — Telehealth: Payer: Self-pay

## 2020-11-17 NOTE — Telephone Encounter (Signed)
Will do a prescription in the morning, I had written the order for nebulizer as a DME initially, it probably printed at the back.

## 2020-11-17 NOTE — Telephone Encounter (Signed)
Please write prescription for nebulizer on prescription pad and include diagnosis. Give to [facility] to fax.

## 2020-11-17 NOTE — Telephone Encounter (Signed)
PA in process for lidocaine 5% patch Key: [id number]

## 2020-11-18 NOTE — Telephone Encounter (Signed)
I have prescription and have faxed.

## 2020-11-18 NOTE — Telephone Encounter (Signed)
Je,  I looked on back counter and did not see script for nebulizer. We will need another order and fax. I have pended order. Just verify this is what pt requires please and I will fax when ready.

## 2020-11-19 NOTE — Telephone Encounter (Signed)
Denied today  Your request has been denied

## 2020-11-22 DIAGNOSIS — J449 Chronic obstructive pulmonary disease, unspecified: Secondary | ICD-10-CM | POA: Diagnosis not present

## 2020-11-24 ENCOUNTER — Emergency Department (HOSPITAL_COMMUNITY): Payer: No Typology Code available for payment source

## 2020-11-24 ENCOUNTER — Ambulatory Visit (INDEPENDENT_AMBULATORY_CARE_PROVIDER_SITE_OTHER): Payer: Medicare HMO | Admitting: Neurology

## 2020-11-24 ENCOUNTER — Emergency Department (HOSPITAL_COMMUNITY)
Admission: EM | Admit: 2020-11-24 | Discharge: 2020-11-25 | Disposition: A | Discharge status: Home / Self Care | Payer: No Typology Code available for payment source | Attending: Emergency Medicine | Admitting: Emergency Medicine

## 2020-11-24 ENCOUNTER — Encounter (HOSPITAL_COMMUNITY): Payer: Self-pay

## 2020-11-24 ENCOUNTER — Other Ambulatory Visit: Payer: Self-pay

## 2020-11-24 ENCOUNTER — Encounter: Payer: Self-pay | Admitting: Neurology

## 2020-11-24 VITALS — BP 107/66 | HR 76 | Ht 62.0 in | Wt 199.0 lb

## 2020-11-24 DIAGNOSIS — L409 Psoriasis, unspecified: Secondary | ICD-10-CM

## 2020-11-24 DIAGNOSIS — R0689 Other abnormalities of breathing: Secondary | ICD-10-CM | POA: Diagnosis not present

## 2020-11-24 DIAGNOSIS — I10 Essential (primary) hypertension: Secondary | ICD-10-CM | POA: Insufficient documentation

## 2020-11-24 DIAGNOSIS — R69 Illness, unspecified: Secondary | ICD-10-CM | POA: Diagnosis not present

## 2020-11-24 DIAGNOSIS — S52572A Other intraarticular fracture of lower end of left radius, initial encounter for closed fracture: Secondary | ICD-10-CM | POA: Diagnosis not present

## 2020-11-24 DIAGNOSIS — M797 Fibromyalgia: Secondary | ICD-10-CM | POA: Diagnosis not present

## 2020-11-24 DIAGNOSIS — I959 Hypotension, unspecified: Secondary | ICD-10-CM | POA: Diagnosis not present

## 2020-11-24 DIAGNOSIS — F17210 Nicotine dependence, cigarettes, uncomplicated: Secondary | ICD-10-CM | POA: Insufficient documentation

## 2020-11-24 DIAGNOSIS — G43709 Chronic migraine without aura, not intractable, without status migrainosus: Secondary | ICD-10-CM

## 2020-11-24 DIAGNOSIS — Q7192 Unspecified reduction defect of left upper limb: Secondary | ICD-10-CM

## 2020-11-24 DIAGNOSIS — S52502A Unspecified fracture of the lower end of left radius, initial encounter for closed fracture: Secondary | ICD-10-CM | POA: Insufficient documentation

## 2020-11-24 DIAGNOSIS — S301XXA Contusion of abdominal wall, initial encounter: Secondary | ICD-10-CM | POA: Insufficient documentation

## 2020-11-24 DIAGNOSIS — Z79899 Other long term (current) drug therapy: Secondary | ICD-10-CM | POA: Insufficient documentation

## 2020-11-24 DIAGNOSIS — S8265XA Nondisplaced fracture of lateral malleolus of left fibula, initial encounter for closed fracture: Secondary | ICD-10-CM | POA: Diagnosis not present

## 2020-11-24 DIAGNOSIS — Z041 Encounter for examination and observation following transport accident: Secondary | ICD-10-CM | POA: Diagnosis not present

## 2020-11-24 DIAGNOSIS — S0990XA Unspecified injury of head, initial encounter: Secondary | ICD-10-CM | POA: Diagnosis not present

## 2020-11-24 DIAGNOSIS — M7061 Trochanteric bursitis, right hip: Secondary | ICD-10-CM | POA: Diagnosis not present

## 2020-11-24 DIAGNOSIS — S99912A Unspecified injury of left ankle, initial encounter: Secondary | ICD-10-CM | POA: Diagnosis not present

## 2020-11-24 DIAGNOSIS — M542 Cervicalgia: Secondary | ICD-10-CM | POA: Diagnosis not present

## 2020-11-24 DIAGNOSIS — J449 Chronic obstructive pulmonary disease, unspecified: Secondary | ICD-10-CM | POA: Insufficient documentation

## 2020-11-24 DIAGNOSIS — M7551 Bursitis of right shoulder: Secondary | ICD-10-CM

## 2020-11-24 DIAGNOSIS — S8262XA Displaced fracture of lateral malleolus of left fibula, initial encounter for closed fracture: Secondary | ICD-10-CM | POA: Diagnosis not present

## 2020-11-24 DIAGNOSIS — S299XXA Unspecified injury of thorax, initial encounter: Secondary | ICD-10-CM | POA: Diagnosis not present

## 2020-11-24 DIAGNOSIS — S82832A Other fracture of upper and lower end of left fibula, initial encounter for closed fracture: Secondary | ICD-10-CM | POA: Diagnosis not present

## 2020-11-24 DIAGNOSIS — S161XXA Strain of muscle, fascia and tendon at neck level, initial encounter: Secondary | ICD-10-CM | POA: Diagnosis not present

## 2020-11-24 DIAGNOSIS — M7989 Other specified soft tissue disorders: Secondary | ICD-10-CM | POA: Diagnosis not present

## 2020-11-24 DIAGNOSIS — M47816 Spondylosis without myelopathy or radiculopathy, lumbar region: Secondary | ICD-10-CM | POA: Diagnosis not present

## 2020-11-24 DIAGNOSIS — R0781 Pleurodynia: Secondary | ICD-10-CM | POA: Diagnosis not present

## 2020-11-24 DIAGNOSIS — M25572 Pain in left ankle and joints of left foot: Secondary | ICD-10-CM | POA: Diagnosis present

## 2020-11-24 LAB — URINALYSIS, ROUTINE W REFLEX MICROSCOPIC
Bilirubin Urine: NEGATIVE
Glucose, UA: NEGATIVE mg/dL
Hgb urine dipstick: NEGATIVE
Ketones, ur: NEGATIVE mg/dL
Leukocytes,Ua: NEGATIVE
Nitrite: POSITIVE — AB
Protein, ur: NEGATIVE mg/dL
Specific Gravity, Urine: 1.028 (ref 1.005–1.030)
pH: 6 (ref 5.0–8.0)

## 2020-11-24 LAB — CBC WITH DIFFERENTIAL/PLATELET
Abs Immature Granulocytes: 0.06 10*3/uL (ref 0.00–0.07)
Basophils Absolute: 0 10*3/uL (ref 0.0–0.1)
Basophils Relative: 0 %
Eosinophils Absolute: 0.1 10*3/uL (ref 0.0–0.5)
Eosinophils Relative: 1 %
HCT: 46.9 % — ABNORMAL HIGH (ref 36.0–46.0)
Hemoglobin: 15.4 g/dL — ABNORMAL HIGH (ref 12.0–15.0)
Immature Granulocytes: 1 %
Lymphocytes Relative: 7 %
Lymphs Abs: 0.8 10*3/uL (ref 0.7–4.0)
MCH: 31.2 pg (ref 26.0–34.0)
MCHC: 32.8 g/dL (ref 30.0–36.0)
MCV: 95.1 fL (ref 80.0–100.0)
Monocytes Absolute: 0.4 10*3/uL (ref 0.1–1.0)
Monocytes Relative: 3 %
Neutro Abs: 10.5 10*3/uL — ABNORMAL HIGH (ref 1.7–7.7)
Neutrophils Relative %: 88 %
Platelets: 231 10*3/uL (ref 150–400)
RBC: 4.93 MIL/uL (ref 3.87–5.11)
RDW: 13.2 % (ref 11.5–15.5)
WBC: 11.9 10*3/uL — ABNORMAL HIGH (ref 4.0–10.5)
nRBC: 0 % (ref 0.0–0.2)

## 2020-11-24 LAB — COMPREHENSIVE METABOLIC PANEL WITH GFR
ALT: 18 U/L (ref 0–44)
AST: 27 U/L (ref 15–41)
Albumin: 4 g/dL (ref 3.5–5.0)
Alkaline Phosphatase: 70 U/L (ref 38–126)
Anion gap: 9 (ref 5–15)
BUN: 10 mg/dL (ref 6–20)
CO2: 23 mmol/L (ref 22–32)
Calcium: 8.6 mg/dL — ABNORMAL LOW (ref 8.9–10.3)
Chloride: 105 mmol/L (ref 98–111)
Creatinine, Ser: 0.7 mg/dL (ref 0.44–1.00)
GFR, Estimated: >60 mL/min
Glucose, Bld: 150 mg/dL — ABNORMAL HIGH (ref 70–99)
Potassium: 3.8 mmol/L (ref 3.5–5.1)
Sodium: 137 mmol/L (ref 135–145)
Total Bilirubin: 0.6 mg/dL (ref 0.3–1.2)
Total Protein: 6.9 g/dL (ref 6.5–8.1)

## 2020-11-24 MED ORDER — MIDAZOLAM HCL 2 MG/2ML IJ SOLN
1.0000 mg | Freq: Once | INTRAMUSCULAR | Status: AC
  Administered 2020-11-25: 1 mg via INTRAVENOUS
  Filled 2020-11-24: qty 2

## 2020-11-24 MED ORDER — FENTANYL CITRATE (PF) 100 MCG/2ML IJ SOLN
50.0000 ug | Freq: Once | INTRAMUSCULAR | Status: AC
  Administered 2020-11-24: 50 ug via INTRAVENOUS
  Filled 2020-11-24: qty 2

## 2020-11-24 MED ORDER — LIDOCAINE HCL (PF) 1 % IJ SOLN
20.0000 mL | Freq: Once | INTRAMUSCULAR | Status: AC
  Administered 2020-11-25: 20 mL via INTRADERMAL
  Filled 2020-11-24: qty 30

## 2020-11-24 MED ORDER — IOHEXOL 300 MG/ML  SOLN
100.0000 mL | Freq: Once | INTRAMUSCULAR | Status: AC | PRN
  Administered 2020-11-24: 100 mL via INTRAVENOUS

## 2020-11-24 MED ORDER — POVIDONE-IODINE 10 % EX SOLN
CUTANEOUS | Status: DC | PRN
  Filled 2020-11-24: qty 15

## 2020-11-24 NOTE — Progress Notes (Signed)
[facility]  PATIENT: Judy Evans DOB: 02-24-1971  REFERRING DOCTOR OR PCP:  Dr. Evette Doffing SOURCE: Patient, notes from Dr. Evette Doffing imaging reports, MRI images on PACS.  _________________________________   HISTORICAL  CHIEF COMPLAINT:  Chief Complaint  Patient presents with  . Follow-up    RM 71 w/ friend. Last seen 05/24/2020 by AL,NP. On Ajovy for migraines.    HISTORY OF PRESENT ILLNESS:  Judy Evans is a 50 y.o. woman with headaches, shoulder pain, FMS and neck pain.  Update3/10/2020: Migraines have done better on Ajovy. She now has only a few HA/month.  They are still intense when they poccur.  She is reporting a lot of neck pain, right > left.   She also notes pain in the occiput.     Splenius capitus TPI/ONB helped pan in the past.     She also has lower back pain.  Pain is worse with bending backward.    She has facet hypertrophy at L4L5 and L5S1 but there was no nerve root compression on a 2017 MRI (in [place]).    She has seen Dr. Francesco Runner in the past and had RFA ablation in the past.   Pain has usually been left > right and down the left leg but now the right leg hurts more at the hip.     In the past a trochanteric bursa injection on the left helped x many months.  She is on gabapentin 800 mg po qid  She has fibromyalgia.    She has some sleep maintenance insomnia.     She is on Cosentyx for psoriasis.    HA History:   She has had headaches off and on x many years but they worsened 4-5 years ago. She had three especially bad headaches associated with difficulty speaking.   She saw a neurologist at [facility].   She was placed on amitriptyline with some benefit.   However, when she moved, her new PCP would not right amitriptyline for her.   She was referred to a pain management practice.    When they occur, she often gets sharp pains on the right.   Pain is stabbing.   She gets nausea but rarely has vomiting.   She has had no furhter migraine with  aphasia.  Moving increases her pain.   Bright lights and noises increases her pain.  Amitriptyline has helped the headaches Triptans help sometimes but not always.   Tylenol arthritis helps a little bit.   Marland KitchenAjovy started in 2021 with benefit.     DATA IMAGING MRI of the cervical spine dated 07/14/2016. At C3-C4, she has a disc osteophyte complex causing moderate left foraminal narrowing with some encroachment upon the left C4 nerve root. At C5-C6 she has mild left foraminal narrowing due to a small disc osteophyte complex. There does not appear to be any nerve root compression.  MRI of the cervical spine 06/30/2020 showed left greater than right disc osteophyte complexes causing left foraminal narrowing but no nerve root compression at C3-C4.  At C5-C6 there is a right greater than left disc osteophyte bite complex.  No nerve root compression.  No spinal stenosis.  Essentially unchanged compared to the 07/30/2018 MRI.  MRI of the lumbar spine 06/30/2020 showed degenerative changes at L4-L5 with mild facet hypertrophy and mild bilateral lateral recess stenosis but no nerve root compression or spinal stenosis.  At L5-S1 there is facet hypertrophy but no nerve root compression or spinal stenosis.  NCV/EMG 04/23/18 Impression:  This NCV/EMG study shows the following: 1.   Mild chronic left C7 radiculopathy without active features 2.   No evidence of median or ulnar neuropathies.     REVIEW OF SYSTEMS: Constitutional: No fevers, chills, sweats, or change in appetite Eyes: No visual changes, double vision, eye pain Ear, nose and throat: No hearing loss, ear pain, nasal congestion, sore throat Cardiovascular: No chest pain, palpitations Respiratory: No shortness of breath at rest or with exertion.   No wheezes GastrointestinaI: has Irritable bowel Genitourinary: No dysuria, urinary retention or frequency.  No nocturia. Musculoskeletal: as above, fibromyalgia/neck pain/ shoulder pain/back  pain Integumentary: Has psoriasis Neurological: as above Psychiatric: No depression at this time.  No anxiety Endocrine: has pre-diabetesNo palpitations, diaphoresis, change in appetite, change in weigh or increased thirst Hematologic/Lymphatic: No anemia, purpura, petechiae. Allergic/Immunologic: No itchy/runny eyes, nasal congestion, recent allergic reactions, rashes  ALLERGIES: Allergies  Allergen Reactions  . Rosuvastatin Calcium Swelling    Ends up in the hospital  . Statins Swelling    Ends up in the hospital  . Wellbutrin [Bupropion]     suicidal  . Hctz [Hydrochlorothiazide] Nausea And Vomiting and Rash  . Hydrocodone-Acetaminophen Rash  . Oxycodone Rash  . Penicillins Nausea And Vomiting and Rash    Has patient had a PCN reaction causing immediate rash, facial/tongue/throat swelling, SOB or lightheadedness with hypotension: Yes Has patient had a PCN reaction causing severe rash involving mucus membranes or skin necrosis: No Has patient had a PCN reaction that required hospitalization No Has patient had a PCN reaction occurring within the last 10 years: No If all of the above answers are "NO", then may proceed with Cephalosporin use.     HOME MEDICATIONS:  Current Outpatient Medications:  .  acetaminophen (TYLENOL) 500 MG tablet, Take 1,000 mg by mouth every 6 (six) hours as needed for mild pain., Disp: , Rfl:  .  AJOVY 225 MG/1.5ML SOSY, INJECT 1 SYRINGE UNDER THE SKIN EVERY 28 DAYS., Disp: 4.5 mL, Rfl: 3 .  albuterol (VENTOLIN HFA) 108 (90 Base) MCG/ACT inhaler, USE 2 PUFFS BY MOUTH EVERY 4 HOURS AS NEEDED FOR WHEEZING OR SHORTNESS OF BREATH, Disp: 18 each, Rfl: 2 .  amitriptyline (ELAVIL) 25 MG tablet, TAKE 1 TO 2 TABLETS BY MOUTH AT BEDTIME, Disp: 180 tablet, Rfl: 0 .  cetirizine (ZYRTEC) 10 MG tablet, Take 10 mg by mouth daily., Disp: , Rfl:  .  COSENTYX SENSOREADY, 300 MG, 150 MG/ML SOAJ, , Disp: , Rfl:  .  cyclobenzaprine (FLEXERIL) 5 MG tablet, Take 1 tablet (5  mg total) by mouth 3 (three) times daily as needed for muscle spasms., Disp: 30 tablet, Rfl: 0 .  fluticasone (FLONASE) 50 MCG/ACT nasal spray, SPRAY 2 SPRAYS INTO EACH NOSTRIL EVERY DAY, Disp: 48 mL, Rfl: 0 .  gabapentin (NEURONTIN) 800 MG tablet, Take 1 tablet (800 mg total) by mouth 4 (four) times daily. (Needs to be seen before next refill), Disp: 120 tablet, Rfl: 2 .  ibuprofen (ADVIL,MOTRIN) 600 MG tablet, Take 1 tablet (600 mg total) by mouth every 8 (eight) hours as needed., Disp: 15 tablet, Rfl: 0 .  ipratropium-albuterol (DUONEB) 0.5-2.5 (3) MG/3ML SOLN, Take 3 mLs by nebulization every 6 (six) hours as needed., Disp: 360 mL, Rfl: 0 .  lidocaine (LIDODERM) 5 %, Place 1 patch onto the skin daily. Remove & Discard patch within 12 hours or as directed by MD, Disp: 30 patch, Rfl: 2 .  meclizine (ANTIVERT) 25 MG tablet, TAKE 1 TABLET (25  MG TOTAL) BY MOUTH 3 (THREE) TIMES DAILY AS NEEDED FOR DIZZINESS., Disp: 90 tablet, Rfl: 1 .  nystatin (MYCOSTATIN) 100000 UNIT/ML suspension, Take 1 teaspoon, swish, gargle for 1 minute and then spit up 4 times a day until symptoms improve., Disp: 60 mL, Rfl: 1 .  nystatin ointment (MYCOSTATIN), APPLY 1 APPLICATION TOPICALLY 2 (TWO) TIMES DAILY AS NEEDED., Disp: 30 g, Rfl: 0 .  omeprazole (PRILOSEC) 40 MG capsule, 1 capsule by mouth daily, Disp: 90 capsule, Rfl: 0 .  SUMAtriptan (IMITREX) 100 MG tablet, Take 1 tablet (100 mg total) by mouth once as needed for up to 1 dose for migraine. May repeat in 2 hours if headache persists or recurs., Disp: 10 tablet, Rfl: 2 .  triamcinolone cream (KENALOG) 0.1 %, , Disp: , Rfl:  .  triamcinolone ointment (KENALOG) 0.5 %, Apply topically 2 (two) times daily., Disp: , Rfl:   PAST MEDICAL HISTORY: Past Medical History:  Diagnosis Date  . Anxiety   . Aphasia   . Arthritis   . COPD (chronic obstructive pulmonary disease) ([place])   . DDD (degenerative disc disease), cervical   . DDD (degenerative disc disease), lumbar   .  Degenerative disc disease at L5-S1 level   . Fibromyalgia   . GERD (gastroesophageal reflux disease)   . H. pylori infection   . Hyperlipidemia    States she does not have  . Hypertension    States she does not have  . IBS (irritable bowel syndrome)   . LLQ pain 06/18/2015  . Migraines   . Moody 01/22/2015  . Pain with urination 02/19/2015  . Psoriasis   . Scoliosis   . Spondylolysis     PAST SURGICAL HISTORY: Past Surgical History:  Procedure Laterality Date  . ABDOMINAL HYSTERECTOMY    . BIOPSY  09/14/2016   Procedure: BIOPSY;  Surgeon: Daneil Dolin, MD;  Location: [facility];  Service: Endoscopy;;  gastric   . CARDIAC CATHETERIZATION  2015   patient reported it to be normal. "I was dying in pain during procedure".   . CARPAL TUNNEL RELEASE Right   . CESAREAN SECTION    . CHOLECYSTECTOMY    . COLONOSCOPY  06/2014   Altamese Dilling DeMason: normal  . DILATION AND CURETTAGE OF UTERUS    . ESOPHAGOGASTRODUODENOSCOPY  07/2014   Burke Keels: Normal  . ESOPHAGOGASTRODUODENOSCOPY (EGD) WITH PROPOFOL N/A 09/14/2016   Dr. Gala Romney: Normal esophagus status post empiric dilation, erosive gastropathy with biopsies showing chronic inactive gastritis, no H pylori.  Marland Kitchen FOOT SURGERY Bilateral    removal of heel spurs  . MALONEY DILATION  09/14/2016   Procedure: Venia Minks DILATION;  Surgeon: Daneil Dolin, MD;  Location: [facility];  Service: Endoscopy;;  . rotator cuff surgery Right   . TONSILLECTOMY      FAMILY HISTORY: Family History  Problem Relation Age of Onset  . COPD Mother   . Diabetes Mother   . Osteoarthritis Mother   . Atrial fibrillation Mother   . Heart disease Mother   . Healthy Father   . Lupus Sister   . CAD Sister   . Diabetes Sister   . Early death Brother        pneumonia  . Asthma Daughter   . Asthma Son   . Cancer Maternal Grandmother 25       colon  . Arthritis Maternal Grandmother   . Diabetes Maternal Grandmother   . Dementia Maternal Grandmother   .  Other Maternal Grandfather  brain tumor  . Cancer Paternal Grandmother        breast  . Cancer Paternal Grandfather        lung  . Other Paternal Grandfather        brain aneurysm  . Factor V Leiden deficiency Sister   . Arthritis Sister   . Psoriasis Sister     SOCIAL HISTORY:  Social History   Socioeconomic History  . Marital status: Legally Separated    Spouse name: Not on file  . Number of children: 2  . Years of education: GED  . Highest education level: GED or equivalent  Occupational History  . Occupation: Disabled  Tobacco Use  . Smoking status: Current Every Day Smoker    Packs/day: 1.50    Years: 30.00    Pack years: 45.00    Types: Cigarettes  . Smokeless tobacco: Never Used  Vaping Use  . Vaping Use: Former  Substance and Sexual Activity  . Alcohol use: No  . Drug use: No  . Sexual activity: Not Currently    Birth control/protection: Surgical    Comment: hyst  Other Topics Concern  . Not on file  Social History Narrative   Caffeine use: Drinks coffee (2 cups per day), tea/soda- 2-3 cups per day   Right handed   Lives alone   Social Determinants of Health   Financial Resource Strain: Not on file  Food Insecurity: Not on file  Transportation Needs: Not on file  Physical Activity: Not on file  Stress: Not on file  Social Connections: Not on file  Intimate Partner Violence: Not on file     PHYSICAL EXAM  Vitals:   11/24/20 1128  BP: 107/66  Pulse: 76  Weight: 199 lb (90.3 kg)  Height: 5\' 2"  (1.575 m)    Body mass index is 36.4 kg/m.   General: The patient is well-developed and well-nourished and in no acute distress   Musculoskeletal: There is tenderness over the right subacromial bursa and she has reduced range of motion in the shoulder.  There is tenderness over the right trochanteric bursa.  She is tender over the splenius capitis on the right.  She has some tenderness over many of the fibromyalgia tender points.    Neurologic Exam  Mental status: The patient is alert and oriented x 3 at the time of the examination. The patient has apparent normal recent and remote memory, with an apparently normal attention span and concentration ability.   Speech is normal.  Cranial nerves: Extraocular muscles are intact.  Facial strength and sensation is normal.  Trapezius strength is normal.  No obvious hearing deficits are noted.  Motor:  Muscle bulk is normal.   Muscle tone is normal. Strength is 5/5 in the arms or legs..   Sensory: Sensory testing is intact to pinprick, soft touch and vibration sensation in all 4 extremities.  Coordination: Cerebellar testing reveals good finger-nose-finger bilaterally.  Gait and station: Station is normal.   Gait is normal.  Tandem gait is mildly wide.  Romberg is negative.  Reflexes: Deep tendon reflexes are symmetric and normal bilaterally.        DIAGNOSTIC DATA (LABS, IMAGING, TESTING) - I reviewed patient records, labs, notes, testing and imaging myself where available.  Lab Results  Component Value Date   WBC 11.9 (H) 11/24/2020   HGB 15.4 (H) 11/24/2020   HCT 46.9 (H) 11/24/2020   MCV 95.1 11/24/2020   PLT 231 11/24/2020      Component Value  Date/Time   NA 143 03/22/2018 1316   K 3.8 03/22/2018 1316   CL 104 03/22/2018 1316   CO2 24 03/22/2018 1316   GLUCOSE 116 (H) 03/22/2018 1316   GLUCOSE 98 07/07/2016 1525   BUN 10 03/22/2018 1316   CREATININE 0.86 03/22/2018 1316   CREATININE 0.76 07/07/2016 1525   CALCIUM 9.3 03/22/2018 1316   PROT 6.8 03/22/2018 1316   ALBUMIN 4.5 03/22/2018 1316   AST 13 03/22/2018 1316   ALT 12 03/22/2018 1316   ALKPHOS 102 03/22/2018 1316   BILITOT 0.4 03/22/2018 1316   GFRNONAA 81 03/22/2018 1316   GFRAA 94 03/22/2018 1316   Lab Results  Component Value Date   CHOL 199 01/22/2017   HDL 46 01/22/2017   LDLCALC 116 (H) 01/22/2017   TRIG 187 (H) 01/22/2017   CHOLHDL 4.3 01/22/2017   Lab Results  Component Value  Date   HGBA1C 5.5 03/22/2018   Lab Results  Component Value Date   VITAMINB12 283 09/15/2016   Lab Results  Component Value Date   TSH 1.230 03/22/2018       ASSESSMENT AND PLAN    1. Neck pain   2. Facet syndrome, lumbar   3. Fibromyalgia affecting multiple sites   4. Psoriasis   5. Subacromial bursitis of right shoulder joint   6. Trochanteric bursitis of right hip   7. Chronic migraine w/o aura, not intractable, w/o stat migr      1.   Right splenius capitus TPI with 20 mg Depo-Medrol in 2.0 cc Lidocaine using sterile technique.   She tolerated the procedures well with no complications.     2.   Continue Ajovy for chronic migraine.     3.   Inject right trochanteric bursa with 30 mg Depomedrol in 3 cc Marcaine using sterile technique.  She tolerated the injectons well 4.   Inject right subacromial bursa with 30 mg Depo-Medrol in 3 cc Marcaine using sterile technique.  She tolerated the injectons welll 5.    Return to see me in 6 months and call sooner if she has significant new or worsening symptoms    Richard A. Felecia Shelling, MD, PhD 0/04/6760, 9:50 PM Certified in Neurology, Clinical Neurophysiology, Sleep Medicine, Pain Medicine and Neuroimaging  [facility] [address] [place], [place] [address] [phone]
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
## 2020-11-25 ENCOUNTER — Emergency Department (HOSPITAL_COMMUNITY): Payer: No Typology Code available for payment source

## 2020-11-25 DIAGNOSIS — S52502A Unspecified fracture of the lower end of left radius, initial encounter for closed fracture: Secondary | ICD-10-CM | POA: Diagnosis not present

## 2020-11-25 DIAGNOSIS — M7989 Other specified soft tissue disorders: Secondary | ICD-10-CM | POA: Diagnosis not present

## 2020-11-25 DIAGNOSIS — S52572A Other intraarticular fracture of lower end of left radius, initial encounter for closed fracture: Secondary | ICD-10-CM | POA: Diagnosis not present

## 2020-11-25 MED ORDER — HYDROMORPHONE HCL 1 MG/ML IJ SOLN
1.0000 mg | Freq: Once | INTRAMUSCULAR | Status: AC
  Administered 2020-11-25: 1 mg via INTRAVENOUS
  Filled 2020-11-25: qty 1

## 2020-11-25 MED ORDER — HYDROCODONE-ACETAMINOPHEN 5-325 MG PO TABS: 1.0000 | ORAL_TABLET | ORAL | 0 refills | Status: DC | PRN

## 2020-11-25 MED ORDER — PROPOFOL 10 MG/ML IV BOLUS
INTRAVENOUS | Status: AC | PRN
Start: 2020-11-25 — End: 2020-11-25
  Administered 2020-11-25 (×2): 50 mg via INTRAVENOUS

## 2020-11-25 MED ORDER — PROPOFOL 500 MG/50ML IV EMUL: INTRAVENOUS | Status: AC | PRN

## 2020-11-25 MED ORDER — HYDROCODONE-ACETAMINOPHEN 5-325 MG PO TABS: ORAL_TABLET | ORAL | 0 refills | Status: DC

## 2020-11-25 MED ORDER — PROPOFOL 10 MG/ML IV BOLUS
0.5000 mg/kg | Freq: Once | INTRAVENOUS | Status: AC
  Administered 2020-11-25: 44.3 mg via INTRAVENOUS
  Filled 2020-11-25: qty 20
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
## 2020-11-25 NOTE — Discharge Instructions (Addendum)
Your x-rays and CTs this evening show that you have a broken bone in your left ankle and your left wrist.  Keep the boot in place and the left wrist splint in place until you are seen by orthopedics.  Elevate your left hand when possible.  Someone from Dr. Amedeo Kinsman office will likely contact you tomorrow.  If not, his follow-up information is listed and you may call his office to arrange follow-up appointment.  Also, follow-up with your primary doctor next week for recheck.  Return emergency department for any worsening symptoms.
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
## 2020-11-25 NOTE — Progress Notes (Signed)
RT present for conscious sedation. No bagging required during procedure. Patient's O2 sats dropped briefly to 86% on 2 lpm nasal cannula, so bumped up to 5 lpm until patient started to wake up more and sats were 100%. Patient on 3 lpm when RT left room. CO2 levels remained normal through procedure.

## 2020-11-26 ENCOUNTER — Encounter: Payer: Self-pay | Admitting: Orthopedic Surgery

## 2020-11-26 ENCOUNTER — Other Ambulatory Visit: Payer: Self-pay

## 2020-11-26 ENCOUNTER — Ambulatory Visit (INDEPENDENT_AMBULATORY_CARE_PROVIDER_SITE_OTHER): Payer: Medicare HMO | Admitting: Orthopedic Surgery

## 2020-11-26 ENCOUNTER — Ambulatory Visit: Payer: Medicare HMO

## 2020-11-26 DIAGNOSIS — S52532A Colles' fracture of left radius, initial encounter for closed fracture: Secondary | ICD-10-CM | POA: Diagnosis not present

## 2020-11-26 DIAGNOSIS — S8254XA Nondisplaced fracture of medial malleolus of right tibia, initial encounter for closed fracture: Secondary | ICD-10-CM

## 2020-11-26 DIAGNOSIS — S82832A Other fracture of upper and lower end of left fibula, initial encounter for closed fracture: Secondary | ICD-10-CM | POA: Diagnosis not present

## 2020-11-26 DIAGNOSIS — M25571 Pain in right ankle and joints of right foot: Secondary | ICD-10-CM

## 2020-11-26 MED ORDER — OXYCODONE HCL 5 MG PO TABS: 5.0000 mg | ORAL_TABLET | Freq: Four times a day (QID) | ORAL | 0 refills | Status: DC | PRN

## 2020-11-26 MED FILL — Hydrocodone-Acetaminophen Tab 5-325 MG: ORAL | Qty: 6 | Status: AC
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
## 2020-11-27 LAB — URINE CULTURE: Culture: >=100000 — AB

## 2020-11-30 ENCOUNTER — Ambulatory Visit (INDEPENDENT_AMBULATORY_CARE_PROVIDER_SITE_OTHER): Payer: Medicare HMO

## 2020-11-30 DIAGNOSIS — Z0000 Encounter for general adult medical examination without abnormal findings: Secondary | ICD-10-CM
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
## 2020-12-03 ENCOUNTER — Ambulatory Visit: Payer: Medicare HMO | Admitting: Orthopedic Surgery

## 2020-12-07 ENCOUNTER — Ambulatory Visit: Payer: Medicare HMO

## 2020-12-07 ENCOUNTER — Encounter: Payer: Self-pay | Admitting: Orthopedic Surgery

## 2020-12-07 ENCOUNTER — Other Ambulatory Visit: Payer: Self-pay

## 2020-12-07 ENCOUNTER — Ambulatory Visit (INDEPENDENT_AMBULATORY_CARE_PROVIDER_SITE_OTHER): Payer: Medicare HMO | Admitting: Orthopedic Surgery

## 2020-12-07 ENCOUNTER — Encounter: Payer: Medicare HMO | Admitting: Nurse Practitioner

## 2020-12-07 DIAGNOSIS — S52532D Colles' fracture of left radius, subsequent encounter for closed fracture with routine healing: Secondary | ICD-10-CM

## 2020-12-07 DIAGNOSIS — S82832D Other fracture of upper and lower end of left fibula, subsequent encounter for closed fracture with routine healing: Secondary | ICD-10-CM

## 2020-12-07 DIAGNOSIS — S8254XD Nondisplaced fracture of medial malleolus of right tibia, subsequent encounter for closed fracture with routine healing: Secondary | ICD-10-CM
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
## 2020-12-07 NOTE — Patient Instructions (Signed)

## 2020-12-08 ENCOUNTER — Other Ambulatory Visit: Payer: Self-pay | Admitting: *Deleted

## 2020-12-08 DIAGNOSIS — J449 Chronic obstructive pulmonary disease, unspecified: Secondary | ICD-10-CM

## 2020-12-08 MED ORDER — IPRATROPIUM-ALBUTEROL 0.5-2.5 (3) MG/3ML IN SOLN: 3.0000 mL | Freq: Four times a day (QID) | RESPIRATORY_TRACT | 2 refills | Status: DC | PRN

## 2020-12-13 ENCOUNTER — Ambulatory Visit (INDEPENDENT_AMBULATORY_CARE_PROVIDER_SITE_OTHER): Payer: Medicare HMO | Admitting: Family Medicine

## 2020-12-13 ENCOUNTER — Encounter: Payer: Self-pay | Admitting: Family Medicine

## 2020-12-13 ENCOUNTER — Telehealth: Payer: Self-pay | Admitting: Orthopedic Surgery

## 2020-12-13 DIAGNOSIS — R110 Nausea: Secondary | ICD-10-CM | POA: Diagnosis not present

## 2020-12-13 DIAGNOSIS — N3000 Acute cystitis without hematuria: Secondary | ICD-10-CM | POA: Diagnosis not present

## 2020-12-13 MED ORDER — SULFAMETHOXAZOLE-TRIMETHOPRIM 800-160 MG PO TABS: 1.0000 | ORAL_TABLET | Freq: Two times a day (BID) | ORAL | 0 refills | Status: AC

## 2020-12-13 MED ORDER — HYDROCODONE-ACETAMINOPHEN 5-325 MG PO TABS: 1.0000 | ORAL_TABLET | Freq: Four times a day (QID) | ORAL | 0 refills | Status: DC | PRN

## 2020-12-13 MED ORDER — ONDANSETRON HCL 4 MG PO TABS: 4.0000 mg | ORAL_TABLET | Freq: Three times a day (TID) | ORAL | 0 refills | Status: DC | PRN
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
## 2020-12-13 NOTE — Telephone Encounter (Signed)
Patient called and left voicemail stating she was told pain medicine would be called in for her and it hasn't.  She needs her pain medicine.  Pharmacy:  [facility] , in [place]

## 2020-12-13 NOTE — Telephone Encounter (Signed)
Please advise 

## 2020-12-14 NOTE — Telephone Encounter (Signed)
I called the patient and she voices understanding. No other concerns. 

## 2020-12-20 DIAGNOSIS — J449 Chronic obstructive pulmonary disease, unspecified: Secondary | ICD-10-CM | POA: Diagnosis not present

## 2020-12-21 ENCOUNTER — Ambulatory Visit (INDEPENDENT_AMBULATORY_CARE_PROVIDER_SITE_OTHER): Payer: Medicare HMO | Admitting: Orthopedic Surgery

## 2020-12-21 ENCOUNTER — Ambulatory Visit: Payer: Medicare HMO

## 2020-12-21 ENCOUNTER — Other Ambulatory Visit: Payer: Self-pay

## 2020-12-21 ENCOUNTER — Encounter: Payer: Self-pay | Admitting: Orthopedic Surgery

## 2020-12-21 VITALS — Ht 62.0 in

## 2020-12-21 DIAGNOSIS — S8254XD Nondisplaced fracture of medial malleolus of right tibia, subsequent encounter for closed fracture with routine healing: Secondary | ICD-10-CM

## 2020-12-21 DIAGNOSIS — S52532D Colles' fracture of left radius, subsequent encounter for closed fracture with routine healing: Secondary | ICD-10-CM

## 2020-12-21 DIAGNOSIS — S8265XD Nondisplaced fracture of lateral malleolus of left fibula, subsequent encounter for closed fracture with routine healing: Secondary | ICD-10-CM

## 2020-12-21 DIAGNOSIS — S8254XA Nondisplaced fracture of medial malleolus of right tibia, initial encounter for closed fracture: Secondary | ICD-10-CM

## 2020-12-21 NOTE — Patient Instructions (Signed)
General Cast Instructions  1.  You were placed in a cast in clinic today.  Please keep the cast material clean, dry and intact.  Please do not use anything to itch the under the cast.  If it gets itchy, you can consider taking benadryl, or similar medication.  If the cast material gets wet, place it on a towel and use a hair dryer on a low setting. 2.  Tylenol or Ibuprofen/Naproxen as needed.   3.  Recommend elevating your extremity as much as possible to help with swelling. 4.  F/u 2 weeks, cast off and repeat XR    Instructions  1.  You have sustained an ankle sprain or stable ankle fracture   2.  I encourage you to stay on your feet and gradually remove your walking boot.   3.  Below are some exercises that you can complete on your own to improve your symptoms.  4.  As an alternative, you can search for ankle sprain exercises online, and can see some demonstrations on YouTube  5.  If you are having difficulty with these exercises, we can also prescribe formal physical therapy  Ankle Exercises Ask your health care provider which exercises are safe for you. Do exercises exactly as told by your health care provider and adjust them as directed. It is normal to feel mild stretching, pulling, tightness, or mild discomfort as you do these exercises. Stop right away if you feel sudden pain or your pain gets worse. Do not begin these exercises until told by your health care provider.  Stretching and range-of-motion exercises These exercises warm up your muscles and joints and improve the movement and flexibility of your ankle. These exercises may also help to relieve pain.  Dorsiflexion/plantar flexion  1. Sit with your R/L knee straight or bent. Do not rest your foot on anything. 2. Flex your left ankle to tilt the top of your foot toward your shin. This is called dorsiflexion. 3. Hold this position for 5 seconds. 4. Point your toes downward to tilt the top of your foot away from your shin.  This is called plantar flexion. 5. Hold this position for 5 seconds. Repeat 10 times. Complete this exercise 2-3 times a day.  As tolerated  Ankle alphabet  1. Sit with your R/L foot supported at your lower leg. ? Do not rest your foot on anything. ? Make sure your foot has room to move freely. 2. Think of your R/L foot as a paintbrush: ? Move your foot to trace each letter of the alphabet in the air. Keep your hip and knee still while you trace the letters. Trace every letter from A to Z. ? Make the letters as large as you can without causing or increasing any discomfort.  Repeat 2-3 times. Complete this exercise 2-3 times a day.   Strengthening exercises These exercises build strength and endurance in your ankle. Endurance is the ability to use your muscles for a long time, even after they get tired. Dorsiflexors These are muscles that lift your foot up. 1. Secure a rubber exercise band or tube to an object, such as a table leg, that will stay still when the band is pulled. Secure the other end around your R/L foot. 2. Sit on the floor, facing the object with your R/L leg extended. The band or tube should be slightly tense when your foot is relaxed. 3. Slowly flex your R/L ankle and toes to bring your foot toward your shin. 4.  Hold this position for 5 seconds. 5. Slowly return your foot to the starting position, controlling the band as you do that. Repeat 10 times. Complete this exercise 2-3 times a day.  Plantar flexors These are muscles that push your foot down. 1. Sit on the floor with your R/L leg extended. 2. Loop a rubber exercise band or tube around the ball of your R/L foot. The ball of your foot is on the walking surface, right under your toes. The band or tube should be slightly tense when your foot is relaxed. 3. Slowly point your toes downward, pushing them away from you. 4. Hold this position for 5 seconds. 5. Slowly release the tension in the band or tube, controlling  smoothly until your foot is back in the starting position. Repeat 10 times. Complete this exercise 2-3 times a day.  Towel curls  1. Sit in a chair on a non-carpeted surface, and put your feet on the floor. 2. Place a towel in front of your feet. 3. Keeping your heel on the floor, put your R/L foot on the towel. 4. Pull the towel toward you by grabbing the towel with your toes and curling them under. Keep your heel on the floor. 5. Let your toes relax. 6. Grab the towel again. Keep pulling the towel until it is completely underneath your foot. Repeat 10 times. Complete this exercise 2-3 times a day.  Standing plantar flexion This is an exercise in which you use your toes to lift your body's weight while standing. 1. Stand with your feet shoulder-width apart. 2. Keep your weight spread evenly over the width of your feet while you rise up on your toes. Use a wall or table to steady yourself if needed, but try not to use it for support. 3. If this exercise is too easy, try these options: ? Shift your weight toward your R/L leg until you feel challenged. ? If told by your health care provider, lift your uninjured leg off the floor. 4. Hold this position for 5 seconds. Repeat 10 times. Complete this exercise 2-3 times a day.  Tandem walking 1. Stand with one foot directly in front of the other. 2. Slowly raise your back foot up, lifting your heel before your toes, and place it directly in front of your other foot. 3. Continue to walk in this heel-to-toe way. Have a countertop or wall nearby to use if needed to keep your balance, but try not to hold onto anything for support.  Repeat 10 times. Complete this exercise 2-3 times a day.   Document Revised: 06/08/2018 Document Reviewed: 06/10/2018 Elsevier Patient Education  [place].

## 2020-12-21 NOTE — Progress Notes (Signed)
Orthopaedic Clinic Return  Assessment: Judy Evans is a 50 y.o. RHD female with the following: Left distal radius fracture, with dorsal angulation; nonoperative management in a cast Right, nondisplaced medial malleolus fracture; nonoperative management in a CAM boot Left, nondisplaced lateral malleolus fracture; nonoperative management in a CAM boot  Plan: Patient continues to improve, albeit slowly.  We discussed that she has multiple extremities injured, and this can limit her functional improvements.  Radiographs of her left wrist and bilateral ankles were obtained and reviewed in clinic today and all injuries remained stable.  We will plan to put her in a another cast for her left wrist.  I have encouraged her to work on range of motion of her fingers, as well as of elevating her left hand to improve swelling.  Regarding her ankles, x-rays look good, and these are healing.  Her swelling has improved.  I provided her with exercises that she can start doing for her ankles.  She can also increase her ambulation as tolerated.  I do anticipate that she will continue to improve, and it is okay for her to push a little bit to increase her strength and mobility.  We will see her back in 2 weeks for repeat evaluation of her left wrist.  At that time, we will likely transition her into a removable wrist splint.  Cast application - left short arm cast   Verbal consent was obtained and the correct extremity was identified. A well padded, appropriately molded short arm cast was applied to the left arm Fingers remained warm and well perfused.   There were no sharp edges Patient tolerated the procedure well Cast care instructions were provided    Body mass index is 35.67 kg/m.  Follow-up: Return in about 2 weeks (around 01/04/2021).   Subjective:  Chief Complaint  Patient presents with  . Wrist Injury    Left wrist, patient reports doing some better.   . Foot Pain    Bilateral foot  pain.    History of Present Illness: Judy Evans is a 50 y.o. female who returns to clinic for repeat evaluation.  Overall, she is improving.  She has been able to take a few steps in her walking boots.  However, she notes that she has less mobility today due to discomfort.  She continues to take narcotic pain medication, but this is limited.  At the last visit, we discussed increasing the range of motion of her fingers, but she has been unable to work on dexterity of her fingers due to pain.  She tolerated the cast well.  She describes herself as being irritable due to the injuries, and her limitations.  No numbness or tingling today.   Review of Systems: No fevers or chills No numbness, tingling No chest pain No shortness of breath No bowel or bladder dysfunction No GI distress No headaches    Objective: Ht 5\' 2"  (1.575 m)   BMI 35.67 kg/m   Physical Exam:  Alert and oriented.  No acute distress.  Seated in a wheelchair.  Evaluation of left wrist and hand demonstrates persistent ecchymosis on the dorsum of her hand.  She also has swelling over the wrist, and into her fingers.  She has limited range of motion due to pain and swelling.  Active motion is intact in the AIN/PIN/U nerve distribution.  No numbness or tingling is appreciated, as sensation is intact the M/U/R nerve distribution.  Right ankle with improved swelling.  She does have  tenderness over the medial malleolus.  Active dorsiflexion of the ankle and great toe.  Left ankle with improved swelling, although she has some persistent ecchymosis over the forefoot.  Tenderness to palpation over the lateral ankle.  Active dorsiflexion of the ankle and the great toe.  Sensation is intact over the dorsum of bilateral feet.  Toes are warm and well perfused.   IMAGING: I personally ordered and reviewed the following images:  X-rays of the left wrist were obtained today out of the cast and demonstrates no interval  displacement of the fracture.  Radial height remains good.  There is mild dorsal angulation, without direct involvement of the joint line.  Impression: Left distal radius fracture in acceptable position.   X-rays of the right ankle were obtained in clinic today and demonstrates a minimally displaced fracture of the medial malleolus.  The mortise remains intact.  There is been no syndesmotic disruption.  Impression: Minimally displaced right medial malleolus fracture  X-rays of the left ankle were obtained in clinic today and demonstrates a minimally displaced fracture of the lateral malleolus.  Mortise remains intact.  There is no disruption of the syndesmosis.  Impression: Minimally displaced left lateral malleolus fracture.  Mordecai Rasmussen, MD 12/21/2020 12:51 PM

## 2020-12-23 ENCOUNTER — Telehealth: Payer: Self-pay | Admitting: Orthopedic Surgery

## 2020-12-23 MED ORDER — HYDROCODONE-ACETAMINOPHEN 5-325 MG PO TABS: 1.0000 | ORAL_TABLET | Freq: Four times a day (QID) | ORAL | 0 refills | Status: DC | PRN

## 2020-12-23 NOTE — Telephone Encounter (Signed)
Patient called to request refill  HYDROcodone-acetaminophen (NORCO/VICODIN) 5-325 MG tablet 15 tablet  [facility], [facility]

## 2020-12-28 ENCOUNTER — Ambulatory Visit: Payer: Medicare HMO | Admitting: Podiatry

## 2021-01-04 ENCOUNTER — Ambulatory Visit (INDEPENDENT_AMBULATORY_CARE_PROVIDER_SITE_OTHER): Payer: Medicare HMO | Admitting: Orthopedic Surgery

## 2021-01-04 ENCOUNTER — Encounter: Payer: Self-pay | Admitting: Orthopedic Surgery

## 2021-01-04 ENCOUNTER — Telehealth: Payer: Self-pay | Admitting: Orthopedic Surgery

## 2021-01-04 ENCOUNTER — Ambulatory Visit: Payer: Medicare HMO

## 2021-01-04 ENCOUNTER — Other Ambulatory Visit: Payer: Self-pay

## 2021-01-04 DIAGNOSIS — S8254XD Nondisplaced fracture of medial malleolus of right tibia, subsequent encounter for closed fracture with routine healing: Secondary | ICD-10-CM

## 2021-01-04 DIAGNOSIS — S52532D Colles' fracture of left radius, subsequent encounter for closed fracture with routine healing: Secondary | ICD-10-CM | POA: Diagnosis not present

## 2021-01-04 DIAGNOSIS — S8265XD Nondisplaced fracture of lateral malleolus of left fibula, subsequent encounter for closed fracture with routine healing: Secondary | ICD-10-CM | POA: Diagnosis not present

## 2021-01-04 MED ORDER — CYCLOBENZAPRINE HCL 10 MG PO TABS: 10.0000 mg | ORAL_TABLET | Freq: Two times a day (BID) | ORAL | 0 refills | Status: DC | PRN

## 2021-01-04 NOTE — Telephone Encounter (Signed)
Order faxed to [facility].  

## 2021-01-04 NOTE — Telephone Encounter (Signed)
Tammy from [facility] called back (ph [phone]); relays that she will call patient to let her know that they do not have an insurance-billable walker with seat, and that they do have one available for self pay.

## 2021-01-04 NOTE — Telephone Encounter (Signed)
Patient asked at check-out from today's office visit if she may have an order for a walker with a seat. Please advise.

## 2021-01-04 NOTE — Progress Notes (Signed)
Orthopaedic Clinic Return  Assessment: Judy Evans is a 50 y.o. RHD female with the following: Left distal radius fracture, with dorsal angulation; nonoperative management in a cast Right, nondisplaced medial malleolus fracture; nonoperative management in a CAM boot Left, nondisplaced lateral malleolus fracture; nonoperative management in a CAM boot  Plan: Radiographs of bilateral ankles demonstrates excellent healing.  She is able to ambulate, and walk in a regular shoe.  She will continue to get stronger.  No restrictions at this time.  Regarding her left wrist, cast was removed in clinic today and the wrist was evaluated.  Understandably, she has stiffness of the wrist.  More concerning at this time, is the significant pain and discomfort she has with extension of her long and ring fingers.  Specifically on the ring finger, she does have a skin tear on the volar aspect of the MCP.  I believe that this is significantly contributing to her pain.  She may have a skin tear developing in the same area on the long finger.  There is no numbness or tingling, and I am not concerned about a nerve injury contributing to her inability to extend these fingers.  She is able to partially extend these fingers, which suggests that the tendons are intact.  As a result, I think the biggest problem that she has contributing to her current presentation is swelling and stiffness from her injury and immobilization.  I would like her to be evaluated by occupational therapy to improve the swelling, range of motion and improve the stiffness in her fingers and her wrist.  She was also fitted for a removable wrist brace.  A referral was placed for occupational therapy to begin as soon as possible.  OT can also assist with appropriate wound care.   We also placed a referral for a rolling walker, with a seat.  Follow-up in 4 weeks.   There is no height or weight on file to calculate BMI.  Follow-up: Return in about 4  weeks (around 02/01/2021).   Subjective:  Chief Complaint  Patient presents with  . Wrist Injury    Left / cast off today patient states can not extend the middle and ring fingers since she has been in current cast   . Ankle Injury    both    History of Present Illness: Judy Evans is a 50 y.o. female who returns to clinic for repeat evaluation.  She continues to improve.  She is transitioned out of her walking boots, and is wearing a regular shoe.  She is also returned home, living by herself.  She continues to take pain medications occasionally.  She states with the most recent cast, she has had difficulty with extension of her fingers.  She is also noting some scissoring of the long and the ring finger.  Her fingers are swollen. She is concerned by her inability to actively extend her fingers.  Pain has significantly improved in her ankles.  She is using a cane to assist with ambulation.  Review of Systems: No fevers or chills No numbness, tingling No chest pain No shortness of breath No bowel or bladder dysfunction No GI distress No headaches    Objective: There were no vitals taken for this visit.  Physical Exam:  Alert and oriented.  No acute distress.   Ambulates with the assistance of a cane, and minimal assist.  Evaluation of the right ankle demonstrates no swelling.  She does continue to have tenderness to palpation over  the medial mall.  Active motion is intact in the TA/EHL.  Left ankle without swelling.  Mild tenderness to palpation over the lateral malleolus.  Active motion is intact in the TA/EHL.  Sensation is intact over the dorsal aspect of bilateral feet.  Evaluation of the left wrist demonstrates persistent swelling about the wrist, as well as the fingers.  Dry skin is diffuse.  Limited motion of the wrist as tolerated.  There is also tenderness to palpation at the wrist joint.  Fingers are warm and well-perfused.  She has diffuse swelling to all  digits.  Limited active extension of her long and ring fingers.  In resting position, there is scissoring.  Passive range of motion at the long and ring finger MCP is poorly tolerated.  With extension of the ring finger, there is a skin tear noted on the volar aspect of the MCP.  There is no obvious infection at this point.  No significant redness or drainage.  No tenderness over the A1 pulleys.   IMAGING: I personally ordered and reviewed the following images:  X-ray of the left wrist was obtained in clinic today out of the cast and demonstrates maintenance of alignment of the distal radius fracture.  There has been no interval displacement.  There is interval callus formation.  The joint remains well aligned with mild dorsal angulation on the lateral.  Impression: Left distal radius fracture in acceptable alignment.  X-ray of the right ankle demonstrates interval healing of the minimally displaced medial malleolus fracture.  No acute injuries are noted.  Mortise remains intact.  No syndesmotic disruption.  Impression: Healing right medial malleolus fracture  X-ray of the left ankle demonstrates interval healing of the distal fibula.  The ankle mortise remains intact.  There is been no syndesmotic disruption.  No acute injuries are noted.  Impression: Healing left lateral malleolus fracture.  Mordecai Rasmussen, MD 01/04/2021 4:43 PM

## 2021-01-12 ENCOUNTER — Encounter (HOSPITAL_COMMUNITY): Payer: Self-pay | Admitting: Occupational Therapy

## 2021-01-12 ENCOUNTER — Other Ambulatory Visit: Payer: Self-pay

## 2021-01-12 ENCOUNTER — Ambulatory Visit (HOSPITAL_COMMUNITY): Payer: Medicare HMO | Attending: Orthopedic Surgery | Admitting: Occupational Therapy

## 2021-01-12 DIAGNOSIS — M25532 Pain in left wrist: Secondary | ICD-10-CM | POA: Insufficient documentation

## 2021-01-12 DIAGNOSIS — M25642 Stiffness of left hand, not elsewhere classified: Secondary | ICD-10-CM | POA: Diagnosis present

## 2021-01-12 DIAGNOSIS — R600 Localized edema: Secondary | ICD-10-CM

## 2021-01-12 DIAGNOSIS — R29898 Other symptoms and signs involving the musculoskeletal system: Secondary | ICD-10-CM | POA: Insufficient documentation

## 2021-01-12 DIAGNOSIS — M25632 Stiffness of left wrist, not elsewhere classified: Secondary | ICD-10-CM | POA: Insufficient documentation

## 2021-01-12 DIAGNOSIS — M79642 Pain in left hand: Secondary | ICD-10-CM | POA: Diagnosis present
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
## 2021-01-13 ENCOUNTER — Encounter (HOSPITAL_COMMUNITY): Payer: Self-pay | Admitting: Occupational Therapy

## 2021-01-13 ENCOUNTER — Ambulatory Visit (HOSPITAL_COMMUNITY): Payer: Medicare HMO | Admitting: Occupational Therapy

## 2021-01-13 DIAGNOSIS — M25532 Pain in left wrist: Secondary | ICD-10-CM

## 2021-01-13 DIAGNOSIS — R600 Localized edema: Secondary | ICD-10-CM

## 2021-01-13 DIAGNOSIS — M25632 Stiffness of left wrist, not elsewhere classified: Secondary | ICD-10-CM | POA: Diagnosis not present

## 2021-01-13 DIAGNOSIS — M25642 Stiffness of left hand, not elsewhere classified: Secondary | ICD-10-CM | POA: Diagnosis not present

## 2021-01-13 DIAGNOSIS — M79642 Pain in left hand: Secondary | ICD-10-CM

## 2021-01-13 DIAGNOSIS — R29898 Other symptoms and signs involving the musculoskeletal system: Secondary | ICD-10-CM | POA: Diagnosis not present

## 2021-01-13 NOTE — Patient Instructions (Signed)
Complete each exercise 10-15X, 2-3X/day  1) Towel crunch Place a small towel on a firm table top. Flatten out the towel and then place your hand on one end of it.  Next, flex your fingers 2-5 (index finger through pinky finger) as you pull the towel towards your hand.    2) Digit composite flexion/adduction (make a fist) Hold your hand up as shown. Open and close your hand into a fist and repeat. If you cannot make a full fist, then make a partial fist.    3) Thumb/finger opposition Touch the tip of the thumb to each fingertip one by one. Extend fingers fully after they are touched.       4) Digit Abduction/Adduction Hold hand palm down flat on table. Spread your fingers apart and back together.

## 2021-01-13 NOTE — Therapy (Signed)
[place], [place], [address] Phone: [phone]   Fax:  [phone]  Occupational Therapy Treatment  Patient Details  Name: Judy Evans MRN: [id number] Date of Birth: 1971/06/08 Referring Provider (OT): Dr. Larena Glassman   Encounter Date: 01/13/2021   OT End of Session - 01/13/21 1744    Visit Number 2    Number of Visits 16    Date for OT Re-Evaluation 03/13/21   mini-reassessment 5/18/202   Authorization Type 1) [employer], $35 copay 2) Medicaid    Authorization Time Period no visit limit    Progress Note Due on Visit 10    OT Start Time 1431    OT Stop Time 1540    OT Time Calculation (min) 69 min    Activity Tolerance Patient tolerated treatment well    Behavior During Therapy [facility] for tasks assessed/performed           Past Medical History:  Diagnosis Date  . Anxiety   . Aphasia   . Arthritis   . COPD (chronic obstructive pulmonary disease) ([place])   . DDD (degenerative disc disease), cervical   . DDD (degenerative disc disease), lumbar   . Degenerative disc disease at L5-S1 level   . Fibromyalgia   . GERD (gastroesophageal reflux disease)   . H. pylori infection   . Hyperlipidemia    States she does not have  . Hypertension    States she does not have  . IBS (irritable bowel syndrome)   . LLQ pain 06/18/2015  . Migraines   . Moody 01/22/2015  . Pain with urination 02/19/2015  . Psoriasis   . Scoliosis   . Spondylolysis     Past Surgical History:  Procedure Laterality Date  . ABDOMINAL HYSTERECTOMY    . BIOPSY  09/14/2016   Procedure: BIOPSY;  Surgeon: Daneil Dolin, MD;  [facility];  Service: Endoscopy;;  gastric   . CARDIAC CATHETERIZATION  2015   patient reported it to be normal. "I was dying in pain during procedure".   . CARPAL TUNNEL RELEASE Right   . CESAREAN SECTION    . CHOLECYSTECTOMY    . COLONOSCOPY  06/2014   Altamese Dilling DeMason: normal  . DILATION AND  CURETTAGE OF UTERUS    . ESOPHAGOGASTRODUODENOSCOPY  07/2014   Burke Keels: Normal  . ESOPHAGOGASTRODUODENOSCOPY (EGD) WITH PROPOFOL N/A 09/14/2016   Dr. Gala Romney: Normal esophagus status post empiric dilation, erosive gastropathy with biopsies showing chronic inactive gastritis, no H pylori.  Marland Kitchen FOOT SURGERY Bilateral    removal of heel spurs  . MALONEY DILATION  09/14/2016   Procedure: Venia Minks DILATION;  Surgeon: Daneil Dolin, MD;  Location: [facility];  Service: Endoscopy;;  . rotator cuff surgery Right   . TONSILLECTOMY      There were no vitals filed for this visit.   Subjective Assessment - 01/13/21 1431    Subjective  S: I worked with it yesterday and this morning.    Currently in Pain? Yes    Pain Score 6     Pain Location Wrist   and hand   Pain Orientation Left    Pain Descriptors / Indicators Aching;Sore;Crushing;Shooting    Pain Type Acute pain    Pain Radiating Towards towards elbow    Pain Onset More than a month ago    Pain Frequency Constant    Aggravating Factors  movement, splint    Pain Relieving Factors rest, soaking  in warm water    Effect of Pain on Daily Activities unable to use LUE for ADLs    Multiple Pain Sites No              [facility] OT Assessment - 01/13/21 1431      Assessment   Medical Diagnosis s/p left distal radius fracture      Precautions   Precautions Other (comment)    Precaution Comments See [place] Handbook Protocol      Observation/Other Assessments   Quick DASH  93.18      Left Hand AROM   L Thumb MCP 0-60 12 Degrees    L Thumb IP 0-80 20 Degrees    L Index  MCP 0-90 30 Degrees    L Index PIP 0-100 44 Degrees    L Index DIP 0-70 0 Degrees    L Long  MCP 0-90 38 Degrees    L Long PIP 0-100 50 Degrees    L Long DIP 0-70 20 Degrees    L Ring  MCP 0-90 36 Degrees    L Ring PIP 0-100 50 Degrees    L Ring DIP 0-70 0 Degrees    L Little  MCP 0-90 16 Degrees    L Little PIP 0-100 50 Degrees    L Little DIP 0-70 8 Degrees               Quick Dash - 01/13/21 1438    Open a tight or new jar Unable    Do heavy household chores (wash walls, wash floors) Unable    Carry a shopping bag or briefcase Unable    Wash your back Unable    Use a knife to cut food Unable    Recreational activities in which you take some force or impact through your arm, shoulder, or hand (golf, hammering, tennis) Unable    During the past week, to what extent has your arm, shoulder or hand problem interfered with your normal social activities with family, friends, neighbors, or groups? Extremely    During the past week, to what extent has your arm, shoulder or hand problem limited your work or other regular daily activities Extremely    Arm, shoulder, or hand pain. Extreme    Tingling (pins and needles) in your arm, shoulder, or hand Moderate    Difficulty Sleeping Severe difficulty    DASH Score 93.18 %                OT Treatments/Exercises (OP) - 01/13/21 1551      Splinting   Splinting volar wrist cock-up splint fabricated for LUE with wrist in neutral position, straps placed across dorsal hand and dorsal mid-forearm. Moleskin padding applied to webspace at thumb to increase comfort and decrease rubbing      Manual Therapy   Manual Therapy Edema management    Manual therapy comments completed separately from therapeutic exercises    Edema Management retrograde massage completed to left digits, hand, wrist, and forearm to decrease edema and pain and increase joint ROM                  OT Education - 01/13/21 1533    Education Details educated on digit/hand A/ROM to begin completing; splint wear and care; continue retrograde massage    Person(s) Educated Patient;Other (comment)   aunt   Methods Explanation;Demonstration;Handout    Comprehension Verbalized understanding;Returned demonstration            OT Short Term  Goals - 01/13/21 1745      OT SHORT TERM GOAL #1   Title Pt will be provided with and  educated on HEP to improve mobility of left wrist and hand required for daily use.    Time 4    Period Weeks    Status On-going    Target Date 02/11/21      OT SHORT TERM GOAL #2   Title Pt will decrease pain in LUE to 5/10 to improve ability to sleep for 2+ hours without waking due to pain.    Time 4    Period Weeks    Status On-going      OT SHORT TERM GOAL #3   Title Pt will be educated on splint wear and care required for use of daily wrist splint.    Time 1    Period Weeks    Status On-going      OT SHORT TERM GOAL #4   Title Pt will increase forearm, wrist, and digits A/ROM by 15 degrees in all planes to improve ability to use LUE as assist with simple tasks such as donning a shirt or turning a faucet.    Time 4    Period Weeks    Status On-going      OT SHORT TERM GOAL #5   Title Pt will decrease edema in left hand and digits to improve ability to a full fist to increase ability to hold lightweight objects.    Time 4    Period Weeks    Status On-going             OT Long Term Goals - 01/13/21 1745      OT LONG TERM GOAL #1   Title Pt will decrease pain in LUE to 3/10 or less to improve ability to use LUE as non-dominant during ADLs.    Time 8    Period Weeks    Status On-going      OT LONG TERM GOAL #2   Title Pt will decrease fascial restrictions in LUE to minimal amounts or less to improve mobility required for fixing her hair.    Time 8    Period Weeks    Status On-going      OT LONG TERM GOAL #3   Title Pt will increase left forearm and wrist A/ROM to [facility] to improve ability to reach for items in various planes and grasp with LUE.    Time 8    Period Weeks    Status On-going      OT LONG TERM GOAL #4   Title Pt will increase LUE strength to 4+/5 to improve ability to lift and carry items such as groceries or pots and pans.    Time 8    Period Weeks    Status On-going      OT LONG TERM GOAL #5   Title Pt will increase left grip strength to 30#  and pinch strength to 8# to improve ability to grasp and open items such as bottle tops, jar lids, etc.    Time 8    Period Weeks    Status On-going                 Plan - 01/13/21 1551    Clinical Impression Statement A: Pt reports she has been completing edema management techniques since yesterday. Completed edema management at beginning of session to improve mobility required for splinting. Volar wrist cock-up splint fabricated for LUE with wrist  in neutral position, straps placed across dorsal hand and dorsal mid-forearm. Moleskin padding applied to webspace at thumb to increase comfort and decrease rubbing. Completed DASH and took A/ROM measurements for left digits. Pt demonstrates ability to donn and doff splint. Provided and educated on HEP to being for fingers and hand    Body Structure / Function / Physical Skills ADL;Endurance;UE functional use;Fascial restriction;Pain;ROM;Coordination;Sensation;IADL;Skin integrity;Dexterity;Strength;Edema;Mobility;Tone    Plan P: Follow up on splint wear and make adjustments as needed, follow up on HEP. Continue with manual techniques and begin wrist A/ROM, passive stretching if able to tolerate    OT Home Exercise Plan eval: retrograde massage, elevation techniques; 4/21: educated on digit/hand A/ROM to begin completing; splint wear and care; continue retrograde massage    Consulted and Agree with Plan of Care Patient;Family member/caregiver    Family Member Consulted aunt           Patient will benefit from skilled therapeutic intervention in order to improve the following deficits and impairments:   Body Structure / Function / Physical Skills: ADL,Endurance,UE functional use,Fascial restriction,Pain,ROM,Coordination,Sensation,IADL,Skin integrity,Dexterity,Strength,Edema,Mobility,Tone       Visit Diagnosis: Localized edema  Pain in left wrist  Pain in left hand  Stiffness of left wrist, not elsewhere classified  Other symptoms  and signs involving the musculoskeletal system  Stiffness of left hand, not elsewhere classified    Problem List Patient Active Problem List   Diagnosis Date Noted  . Subacromial bursitis of right shoulder joint 11/24/2020  . Trochanteric bursitis of right hip 11/24/2020  . Pain of left lower leg 11/15/2020  . Bilateral low back pain with right-sided sciatica 11/15/2020  . Thrush 11/15/2020  . Insomnia 11/19/2018  . Intractable headache 07/18/2018  . Occipital neuralgia 07/18/2018  . Family history of cerebral aneurysm 07/18/2018  . Numbness 04/23/2018  . Cervical radiculopathy 04/23/2018  . Encounter for long-term use of opiate analgesic 04/22/2018  . Lumbar degenerative disc disease 04/22/2018  . Spondylosis of lumbar spine 04/22/2018  . Fibromyalgia affecting multiple sites 04/22/2018  . Constipation due to pain medication 04/30/2017  . Depression 03/22/2017  . Trochanteric bursitis of left hip 03/13/2017  . Neck pain 11/13/2016  . Left shoulder pain 11/13/2016  . Chronic bilateral low back pain 11/13/2016  . History of colonic polyps 06/20/2016  . IBS (irritable bowel syndrome) 06/20/2016  . Epigastric pain 06/20/2016  . Rhinitis, chronic 04/13/2016  . Otalgia of both ears 04/13/2016  . Arthralgia of right temporomandibular joint 04/13/2016  . BMI 40.0-44.9, adult ([place]) 11/22/2015  . Bilateral carpal tunnel syndrome 11/22/2015  . Tobacco abuse 11/22/2015  . Allergic rhinitis 11/01/2015  . Gastroesophageal reflux disease 11/01/2015  . LLQ pain 06/18/2015  . Bloating 06/18/2015  . Pain with urination 02/19/2015  . Moody 01/22/2015  . Pelvic pain in female 01/13/2015  . Hot flashes due to surgical menopause 01/13/2015  . Pre-diabetes 01/13/2015  . Hypertension 01/13/2015  . Chronic migraine w/o aura, not intractable, w/o stat migr 01/13/2015  . Psoriasis 01/13/2015  . Arthritis 01/13/2015  . Fibromyalgia 01/13/2015  . COPD (chronic obstructive pulmonary disease)  ([place]) 01/13/2015  . Hyperlipidemia 01/13/2015  . Abnormal nuclear stress test 01/07/2014  . Abdominal pain 04/16/2013  . Pain in joint involving multiple sites 02/20/2013  . Head trauma 02/20/2013   Guadelupe Sabin, OTR/L  [phone] 01/13/2021, 5:46 PM  [place] [address] [place], [place], [address] Phone: [phone]   Fax:  [phone]  Name: Judy Evans MRN:  [id number] Date of Birth: [birth year]

## 2021-01-17 ENCOUNTER — Ambulatory Visit (INDEPENDENT_AMBULATORY_CARE_PROVIDER_SITE_OTHER): Payer: Medicare HMO | Admitting: Family Medicine

## 2021-01-17 ENCOUNTER — Encounter: Payer: Self-pay | Admitting: Family Medicine

## 2021-01-17 DIAGNOSIS — J019 Acute sinusitis, unspecified: Secondary | ICD-10-CM | POA: Diagnosis not present

## 2021-01-17 MED ORDER — CEFDINIR 300 MG PO CAPS: 300.0000 mg | ORAL_CAPSULE | Freq: Two times a day (BID) | ORAL | 0 refills | Status: DC

## 2021-01-17 NOTE — Progress Notes (Signed)
Virtual Visit via telephone Note  I connected with Judy Evans on 01/17/21 at 1310 by telephone and verified that I am speaking with the correct person using two identifiers. Judy Evans is currently located at home and patient are currently with her during visit. The provider, Fransisca Kaufmann Rodriguez Aguinaldo, MD is located in their office at time of visit.  Call ended at 1318  I discussed the limitations, risks, security and privacy concerns of performing an evaluation and management service by telephone and the availability of in person appointments. I also discussed with the patient that there may be a patient responsible charge related to this service. The patient expressed understanding and agreed to proceed.   History and Present Illness: Patient is calling in for sinus pressure and stuffiness and headaches and coughing and drainage.  She is having productive cough.  She denies fevers but does have chills.  She denies SOB or wheezing more than normal. Denies sick contacts that she knows of.  She is using flonase and zyrtec. She started 2 days ago with these symtpoms.  1. Acute sinusitis, recurrence not specified, unspecified location     Outpatient Encounter Medications as of 01/17/2021  Medication Sig  . cefdinir (OMNICEF) 300 MG capsule Take 1 capsule (300 mg total) by mouth 2 (two) times daily. 1 po BID  . AJOVY 225 MG/1.5ML SOSY INJECT 1 SYRINGE UNDER THE SKIN EVERY 28 DAYS.  Marland Kitchen albuterol (VENTOLIN HFA) 108 (90 Base) MCG/ACT inhaler USE 2 PUFFS BY MOUTH EVERY 4 HOURS AS NEEDED FOR WHEEZING OR SHORTNESS OF BREATH  . amitriptyline (ELAVIL) 25 MG tablet TAKE 1 TO 2 TABLETS BY MOUTH AT BEDTIME  . cetirizine (ZYRTEC) 10 MG tablet Take 10 mg by mouth daily.  . COSENTYX SENSOREADY, 300 MG, 150 MG/ML SOAJ   . cyclobenzaprine (FLEXERIL) 10 MG tablet Take 1 tablet (10 mg total) by mouth 2 (two) times daily as needed for muscle spasms.  . fluticasone (FLONASE) 50 MCG/ACT nasal spray SPRAY 2  SPRAYS INTO EACH NOSTRIL EVERY DAY  . gabapentin (NEURONTIN) 800 MG tablet Take 1 tablet (800 mg total) by mouth 4 (four) times daily. (Needs to be seen before next refill)  . HYDROcodone-acetaminophen (NORCO/VICODIN) 5-325 MG tablet Take one tab po q 4 hrs prn pain  . HYDROcodone-acetaminophen (NORCO/VICODIN) 5-325 MG tablet Take 1 tablet by mouth every 6 (six) hours as needed.  Marland Kitchen ibuprofen (ADVIL,MOTRIN) 600 MG tablet Take 1 tablet (600 mg total) by mouth every 8 (eight) hours as needed.  Marland Kitchen ipratropium-albuterol (DUONEB) 0.5-2.5 (3) MG/3ML SOLN Take 3 mLs by nebulization every 6 (six) hours as needed.  . lidocaine (LIDODERM) 5 % Place 1 patch onto the skin daily. Remove & Discard patch within 12 hours or as directed by MD  . meclizine (ANTIVERT) 25 MG tablet TAKE 1 TABLET (25 MG TOTAL) BY MOUTH 3 (THREE) TIMES DAILY AS NEEDED FOR DIZZINESS.  Marland Kitchen nystatin (MYCOSTATIN) 100000 UNIT/ML suspension Take 1 teaspoon, swish, gargle for 1 minute and then spit up 4 times a day until symptoms improve.  . nystatin ointment (MYCOSTATIN) APPLY 1 APPLICATION TOPICALLY 2 (TWO) TIMES DAILY AS NEEDED.  Marland Kitchen omeprazole (PRILOSEC) 40 MG capsule 1 capsule by mouth daily  . ondansetron (ZOFRAN) 4 MG tablet Take 1 tablet (4 mg total) by mouth every 8 (eight) hours as needed for nausea or vomiting.  Marland Kitchen oxyCODONE (ROXICODONE) 5 MG immediate release tablet Take 1 tablet (5 mg total) by mouth every 6 (six) hours as needed  for severe pain.  . SUMAtriptan (IMITREX) 100 MG tablet Take 1 tablet (100 mg total) by mouth once as needed for up to 1 dose for migraine. May repeat in 2 hours if headache persists or recurs.  . triamcinolone cream (KENALOG) 0.1 %   . triamcinolone ointment (KENALOG) 0.5 % Apply topically 2 (two) times daily.   No facility-administered encounter medications on file as of 01/17/2021.    Review of Systems  Constitutional: Positive for chills. Negative for fever.  HENT: Positive for congestion, postnasal drip,  rhinorrhea, sinus pressure and sneezing. Negative for ear discharge, ear pain and sore throat.   Eyes: Negative for pain, redness and visual disturbance.  Respiratory: Positive for cough. Negative for chest tightness and shortness of breath.   Cardiovascular: Negative for chest pain and leg swelling.  Genitourinary: Negative for difficulty urinating and dysuria.  Musculoskeletal: Negative for back pain and gait problem.  Skin: Negative for rash.  Neurological: Negative for light-headedness and headaches.  Psychiatric/Behavioral: Negative for agitation and behavioral problems.  All other systems reviewed and are negative.   Observations/Objective: Patient sounds comfortable and in no acute distress  Assessment and Plan: Problem List Items Addressed This Visit   None   Visit Diagnoses    Acute sinusitis, recurrence not specified, unspecified location    -  Primary   Relevant Medications   cefdinir (OMNICEF) 300 MG capsule      Will treat like sinus infection she says no COPD yet but I have concerns that it may turn into COPD.  We will watch for any symptoms and if she worsens she will call back. Follow up plan: Return if symptoms worsen or fail to improve.     I discussed the assessment and treatment plan with the patient. The patient was provided an opportunity to ask questions and all were answered. The patient agreed with the plan and demonstrated an understanding of the instructions.   The patient was advised to call back or seek an in-person evaluation if the symptoms worsen or if the condition fails to improve as anticipated.  The above assessment and management plan was discussed with the patient. The patient verbalized understanding of and has agreed to the management plan. Patient is aware to call the clinic if symptoms persist or worsen. Patient is aware when to return to the clinic for a follow-up visit. Patient educated on when it is appropriate to go to the emergency  department.    I provided 8 minutes of non-face-to-face time during this encounter.    Worthy Rancher, MD

## 2021-01-18 ENCOUNTER — Encounter (HOSPITAL_COMMUNITY): Payer: Self-pay | Admitting: Specialist

## 2021-01-18 ENCOUNTER — Ambulatory Visit (HOSPITAL_COMMUNITY): Payer: Medicare HMO | Admitting: Specialist

## 2021-01-18 ENCOUNTER — Other Ambulatory Visit: Payer: Self-pay

## 2021-01-18 DIAGNOSIS — M25532 Pain in left wrist: Secondary | ICD-10-CM

## 2021-01-18 DIAGNOSIS — M25642 Stiffness of left hand, not elsewhere classified: Secondary | ICD-10-CM | POA: Diagnosis not present

## 2021-01-18 DIAGNOSIS — R29898 Other symptoms and signs involving the musculoskeletal system: Secondary | ICD-10-CM | POA: Diagnosis not present

## 2021-01-18 DIAGNOSIS — M79642 Pain in left hand: Secondary | ICD-10-CM

## 2021-01-18 DIAGNOSIS — M25632 Stiffness of left wrist, not elsewhere classified: Secondary | ICD-10-CM

## 2021-01-18 DIAGNOSIS — R600 Localized edema: Secondary | ICD-10-CM | POA: Diagnosis not present

## 2021-01-18 NOTE — Therapy (Signed)
[place], [place], [address] Phone: [phone]   Fax:  [phone]  Occupational Therapy Treatment  Patient Details  Name: Judy Evans MRN: [id number] Date of Birth: Feb 16, 1971 Referring Provider (OT): Dr. Larena Glassman   Encounter Date: 01/18/2021   OT End of Session - 01/18/21 1226    Visit Number 3    Number of Visits 16    Date for OT Re-Evaluation 03/13/21   mini reassess on 02/09/21   Authorization Type 1) [employer] Medicare, $35 copay 2) Medicaid    Authorization Time Period no visit limit    Progress Note Due on Visit 10    OT Start Time 1033    OT Stop Time 1113    OT Time Calculation (min) 40 min           Past Medical History:  Diagnosis Date  . Anxiety   . Aphasia   . Arthritis   . COPD (chronic obstructive pulmonary disease) ([place])   . DDD (degenerative disc disease), cervical   . DDD (degenerative disc disease), lumbar   . Degenerative disc disease at L5-S1 level   . Fibromyalgia   . GERD (gastroesophageal reflux disease)   . H. pylori infection   . Hyperlipidemia    States she does not have  . Hypertension    States she does not have  . IBS (irritable bowel syndrome)   . LLQ pain 06/18/2015  . Migraines   . Moody 01/22/2015  . Pain with urination 02/19/2015  . Psoriasis   . Scoliosis   . Spondylolysis     Past Surgical History:  Procedure Laterality Date  . ABDOMINAL HYSTERECTOMY    . BIOPSY  09/14/2016   Procedure: BIOPSY;  Surgeon: Daneil Dolin, MD;  [facility];  Service: Endoscopy;;  gastric   . CARDIAC CATHETERIZATION  2015   patient reported it to be normal. "I was dying in pain during procedure".   . CARPAL TUNNEL RELEASE Right   . CESAREAN SECTION    . CHOLECYSTECTOMY    . COLONOSCOPY  06/2014   Altamese Dilling DeMason: normal  . DILATION AND CURETTAGE OF UTERUS    . ESOPHAGOGASTRODUODENOSCOPY  07/2014   Burke Keels: Normal  . ESOPHAGOGASTRODUODENOSCOPY  (EGD) WITH PROPOFOL N/A 09/14/2016   Dr. Gala Romney: Normal esophagus status post empiric dilation, erosive gastropathy with biopsies showing chronic inactive gastritis, no H pylori.  Marland Kitchen FOOT SURGERY Bilateral    removal of heel spurs  . MALONEY DILATION  09/14/2016   Procedure: Venia Minks DILATION;  Surgeon: Daneil Dolin, MD;  [facility];  Service: Endoscopy;;  . rotator cuff surgery Right   . TONSILLECTOMY      There were no vitals filed for this visit.   Subjective Assessment - 01/18/21 1224    Subjective  S:  the splint is ok, it still feels like it is pushing on my skin and there arent any red marks    Currently in Pain? Yes    Pain Score 4     Pain Location Wrist    Pain Orientation Left    Pain Descriptors / Indicators Aching;Sore;Shooting;Pressure;Crushing    Pain Type Acute pain    Pain Onset More than a month ago              [facility] OT Assessment - 01/18/21 0001      Assessment   Medical Diagnosis s/p left distal radius fracture  Referring Provider (OT) Dr. Larena Glassman      Precautions   Precautions Other (comment)    Precaution Comments See [name] Handbook Protocol                    OT Treatments/Exercises (OP) - 01/18/21 0001      Exercises   Exercises Wrist;Hand;Elbow      Elbow Exercises   Other elbow exercises elbow flexion/ext 10 times each A/ROM    Other elbow exercises supination/pronation, aa/rom and a/rom 10 times each      Wrist Exercises   Other wrist exercises wrist flexion/extension 10 times each aa/rom and a/rom      Hand Exercises   Other Hand Exercises joint blocking gentle p/rom each digit and each joint x 5 repetitions    Other Hand Exercises digit opposition to each digit through ring finger x 5, unable to oppose to sf, OT provided flex and hold x 3 seconds and patient then able to complete opposition to her small finger independently.      Manual Therapy   Manual Therapy Edema management;Myofascial release     Manual therapy comments completed separately from therapeutic exercises    Edema Management retrograde massage completed to left digits, hand, wrist, and forearm to decrease edema and pain and increase joint ROM    Myofascial Release myofascial release and manual stretching to left flexor and extensor forearm, wrist, and hand                  OT Education - 01/18/21 1226    Education Details continue as outlined in previous note, elbow a/rom, massage with light cloth or loofah on hand and forearm for desensitization    Person(s) Educated Patient    Methods Explanation    Comprehension Verbalized understanding;Returned demonstration            OT Short Term Goals - 01/13/21 1745      OT SHORT TERM GOAL #1   Title Pt will be provided with and educated on HEP to improve mobility of left wrist and hand required for daily use.    Time 4    Period Weeks    Status On-going    Target Date 02/11/21      OT SHORT TERM GOAL #2   Title Pt will decrease pain in LUE to 5/10 to improve ability to sleep for 2+ hours without waking due to pain.    Time 4    Period Weeks    Status On-going      OT SHORT TERM GOAL #3   Title Pt will be educated on splint wear and care required for use of daily wrist splint.    Time 1    Period Weeks    Status On-going      OT SHORT TERM GOAL #4   Title Pt will increase forearm, wrist, and digits A/ROM by 15 degrees in all planes to improve ability to use LUE as assist with simple tasks such as donning a shirt or turning a faucet.    Time 4    Period Weeks    Status On-going      OT SHORT TERM GOAL #5   Title Pt will decrease edema in left hand and digits to improve ability to a full fist to increase ability to hold lightweight objects.    Time 4    Period Weeks    Status On-going  OT Long Term Goals - 01/13/21 1745      OT LONG TERM GOAL #1   Title Pt will decrease pain in LUE to 3/10 or less to improve ability to use LUE as  non-dominant during ADLs.    Time 8    Period Weeks    Status On-going      OT LONG TERM GOAL #2   Title Pt will decrease fascial restrictions in LUE to minimal amounts or less to improve mobility required for fixing her hair.    Time 8    Period Weeks    Status On-going      OT LONG TERM GOAL #3   Title Pt will increase left forearm and wrist A/ROM to [facility] to improve ability to reach for items in various planes and grasp with LUE.    Time 8    Period Weeks    Status On-going      OT LONG TERM GOAL #4   Title Pt will increase LUE strength to 4+/5 to improve ability to lift and carry items such as groceries or pots and pans.    Time 8    Period Weeks    Status On-going      OT LONG TERM GOAL #5   Title Pt will increase left grip strength to 30# and pinch strength to 8# to improve ability to grasp and open items such as bottle tops, jar lids, etc.    Time 8    Period Weeks    Status On-going                 Plan - 01/18/21 1227    Clinical Impression Statement A: Patient tolerating splint wear well.  Complains of some tightness and indentions from straps, however, upon examination no indentions or red areas present, OT recommended continued use of splint.  May want to readjust degree of extension at wrist at next session.  Patient able to tolerate manual therapy and a/rom, aa/rom this date at forearm, wrist, and hand.  Patient lacked 1" for sf and thumb opposition, however, after place and hold technique x 1 patient able to independently oppose.    Occupational performance deficits (Please refer to evaluation for details): ADL's;IADL's;Rest and Sleep;Work;Leisure    Body Structure / Function / Physical Skills ADL;Endurance;UE functional use;Fascial restriction;Pain;ROM;Coordination;Sensation;IADL;Skin integrity;Dexterity;Strength;Edema;Mobility;Tone    Plan P:  Readjust splint as needed, continue manual therapy and increase to passive stretching.  add functional reach and grasp  task.           Patient will benefit from skilled therapeutic intervention in order to improve the following deficits and impairments:   Body Structure / Function / Physical Skills: ADL,Endurance,UE functional use,Fascial restriction,Pain,ROM,Coordination,Sensation,IADL,Skin integrity,Dexterity,Strength,Edema,Mobility,Tone       Visit Diagnosis: Pain in left wrist  Pain in left hand  Stiffness of left wrist, not elsewhere classified  Localized edema    Problem List Patient Active Problem List   Diagnosis Date Noted  . Subacromial bursitis of right shoulder joint 11/24/2020  . Trochanteric bursitis of right hip 11/24/2020  . Pain of left lower leg 11/15/2020  . Bilateral low back pain with right-sided sciatica 11/15/2020  . Thrush 11/15/2020  . Insomnia 11/19/2018  . Intractable headache 07/18/2018  . Occipital neuralgia 07/18/2018  . Family history of cerebral aneurysm 07/18/2018  . Numbness 04/23/2018  . Cervical radiculopathy 04/23/2018  . Encounter for long-term use of opiate analgesic 04/22/2018  . Lumbar degenerative disc disease 04/22/2018  . Spondylosis of lumbar  spine 04/22/2018  . Fibromyalgia affecting multiple sites 04/22/2018  . Constipation due to pain medication 04/30/2017  . Depression 03/22/2017  . Trochanteric bursitis of left hip 03/13/2017  . Neck pain 11/13/2016  . Left shoulder pain 11/13/2016  . Chronic bilateral low back pain 11/13/2016  . History of colonic polyps 06/20/2016  . IBS (irritable bowel syndrome) 06/20/2016  . Epigastric pain 06/20/2016  . Rhinitis, chronic 04/13/2016  . Otalgia of both ears 04/13/2016  . Arthralgia of right temporomandibular joint 04/13/2016  . BMI 40.0-44.9, adult ([place]) 11/22/2015  . Bilateral carpal tunnel syndrome 11/22/2015  . Tobacco abuse 11/22/2015  . Allergic rhinitis 11/01/2015  . Gastroesophageal reflux disease 11/01/2015  . LLQ pain 06/18/2015  . Bloating 06/18/2015  . Pain with urination  02/19/2015  . Moody 01/22/2015  . Pelvic pain in female 01/13/2015  . Hot flashes due to surgical menopause 01/13/2015  . Pre-diabetes 01/13/2015  . Hypertension 01/13/2015  . Chronic migraine w/o aura, not intractable, w/o stat migr 01/13/2015  . Psoriasis 01/13/2015  . Arthritis 01/13/2015  . Fibromyalgia 01/13/2015  . COPD (chronic obstructive pulmonary disease) ([place]) 01/13/2015  . Hyperlipidemia 01/13/2015  . Abnormal nuclear stress test 01/07/2014  . Abdominal pain 04/16/2013  . Pain in joint involving multiple sites 02/20/2013  . Head trauma 02/20/2013    Vangie Bicker, [place], OTR/L [phone]  01/18/2021, 12:37 PM  [place], [place], [address] Phone: [phone]   Fax:  [phone]  Name: Judy Evans MRN: [id number] Date of Birth: 1971/07/27

## 2021-01-19 ENCOUNTER — Encounter (HOSPITAL_COMMUNITY): Payer: Medicare HMO | Admitting: Occupational Therapy

## 2021-01-20 ENCOUNTER — Other Ambulatory Visit: Payer: Self-pay

## 2021-01-20 ENCOUNTER — Ambulatory Visit (HOSPITAL_COMMUNITY): Payer: Medicare HMO

## 2021-01-20 DIAGNOSIS — M25532 Pain in left wrist: Secondary | ICD-10-CM

## 2021-01-20 DIAGNOSIS — M79642 Pain in left hand: Secondary | ICD-10-CM

## 2021-01-20 DIAGNOSIS — R600 Localized edema: Secondary | ICD-10-CM | POA: Diagnosis not present

## 2021-01-20 DIAGNOSIS — M25632 Stiffness of left wrist, not elsewhere classified: Secondary | ICD-10-CM

## 2021-01-20 DIAGNOSIS — J449 Chronic obstructive pulmonary disease, unspecified: Secondary | ICD-10-CM | POA: Diagnosis not present

## 2021-01-20 DIAGNOSIS — R29898 Other symptoms and signs involving the musculoskeletal system: Secondary | ICD-10-CM | POA: Diagnosis not present

## 2021-01-20 DIAGNOSIS — M25642 Stiffness of left hand, not elsewhere classified: Secondary | ICD-10-CM | POA: Diagnosis not present

## 2021-01-20 NOTE — Patient Instructions (Signed)
Updated Protocol for 8 weeks post fracture 01/20/21  Begin to decrease the wear time of your splint gradually. To leave the splint off for 4-6 hours a day is encouraged for light activities. The splint may left off at night.   Continue with your hand exercises provided at your evaluation.  Plus the exercises below: elbow and wrist.

## 2021-01-21 ENCOUNTER — Encounter (HOSPITAL_COMMUNITY): Payer: Self-pay

## 2021-01-21 NOTE — Therapy (Signed)
[place], [place], [address] Phone: [phone]   Fax:  [phone]  Occupational Therapy Treatment  Patient Details  Name: Judy Evans MRN: [id number] Date of Birth: 1971/09/20 Referring Provider (OT): Dr. Larena Glassman   Encounter Date: 01/20/2021   OT End of Session - 01/21/21 0939    Visit Number 4    Number of Visits 16    Date for OT Re-Evaluation 03/13/21   mini reassess on 02/09/21   Authorization Type 1) [employer] Medicare, $35 copay 2) Medicaid    Authorization Time Period no visit limit    Progress Note Due on Visit 10    OT Start Time 1122    OT Stop Time 1200    OT Time Calculation (min) 38 min    Activity Tolerance Patient tolerated treatment well;Patient limited by pain    Behavior During Therapy [facility] for tasks assessed/performed           Past Medical History:  Diagnosis Date  . Anxiety   . Aphasia   . Arthritis   . COPD (chronic obstructive pulmonary disease) ([place])   . DDD (degenerative disc disease), cervical   . DDD (degenerative disc disease), lumbar   . Degenerative disc disease at L5-S1 level   . Fibromyalgia   . GERD (gastroesophageal reflux disease)   . H. pylori infection   . Hyperlipidemia    States she does not have  . Hypertension    States she does not have  . IBS (irritable bowel syndrome)   . LLQ pain 06/18/2015  . Migraines   . Moody 01/22/2015  . Pain with urination 02/19/2015  . Psoriasis   . Scoliosis   . Spondylolysis     Past Surgical History:  Procedure Laterality Date  . ABDOMINAL HYSTERECTOMY    . BIOPSY  09/14/2016   Procedure: BIOPSY;  Surgeon: Daneil Dolin, MD;  [facility];  Service: Endoscopy;;  gastric   . CARDIAC CATHETERIZATION  2015   patient reported it to be normal. "I was dying in pain during procedure".   . CARPAL TUNNEL RELEASE Right   . CESAREAN SECTION    . CHOLECYSTECTOMY    . COLONOSCOPY  06/2014   Altamese Dilling DeMason:  normal  . DILATION AND CURETTAGE OF UTERUS    . ESOPHAGOGASTRODUODENOSCOPY  07/2014   Burke Keels: Normal  . ESOPHAGOGASTRODUODENOSCOPY (EGD) WITH PROPOFOL N/A 09/14/2016   Dr. Gala Romney: Normal esophagus status post empiric dilation, erosive gastropathy with biopsies showing chronic inactive gastritis, no H pylori.  Marland Kitchen FOOT SURGERY Bilateral    removal of heel spurs  . MALONEY DILATION  09/14/2016   Procedure: Venia Minks DILATION;  Surgeon: Daneil Dolin, MD;  [facility];  Service: Endoscopy;;  . rotator cuff surgery Right   . TONSILLECTOMY      There were no vitals filed for this visit.   Subjective Assessment - 01/21/21 0932    Subjective  S: Is it normal to swell after therapy?    Currently in Pain? Yes    Pain Score 4     Pain Location Wrist    Pain Orientation Left    Pain Descriptors / Indicators Aching;Sore;Pressure    Pain Type Acute pain    Pain Onset More than a month ago    Pain Frequency Constant    Aggravating Factors  movement, splint    Pain Relieving Factors rest, soaking in warm water    Effect  of Pain on Daily Activities unable to use LUE for ADL tasks    Multiple Pain Sites No              [facility] OT Assessment - 01/21/21 0934      Assessment   Medical Diagnosis s/p left distal radius fracture      Precautions   Precautions Other (comment)    Precaution Comments See [place] Handbook Protocol                    OT Treatments/Exercises (OP) - 01/21/21 0934      Exercises   Exercises Wrist;Elbow;Shoulder;Hand      Shoulder Exercises: Seated   Flexion AROM;10 reps      Elbow Exercises   Other elbow exercises elbow flexion/ext 10 times each A/ROM      Additional Wrist Exercises   Sponges 4,7      Splinting   Splinting Adjustments made to wrist cock up splint to decrease pressure areas on medial and lateral aspect of wrist and along volar aspect.                  OT Education - 01/21/21 [id number]    Education Details  Discussed week 8 post fx protocol (decreasing wear time with splint). reviewed and provided update handout with A/ROM exercises for elbow, wrist, and hand.    Person(s) Educated Patient    Methods Explanation;Demonstration;Handout;Verbal cues    Comprehension Verbalized understanding            OT Short Term Goals - 01/13/21 1745      OT SHORT TERM GOAL #1   Title Pt will be provided with and educated on HEP to improve mobility of left wrist and hand required for daily use.    Time 4    Period Weeks    Status On-going    Target Date 02/11/21      OT SHORT TERM GOAL #2   Title Pt will decrease pain in LUE to 5/10 to improve ability to sleep for 2+ hours without waking due to pain.    Time 4    Period Weeks    Status On-going      OT SHORT TERM GOAL #3   Title Pt will be educated on splint wear and care required for use of daily wrist splint.    Time 1    Period Weeks    Status On-going      OT SHORT TERM GOAL #4   Title Pt will increase forearm, wrist, and digits A/ROM by 15 degrees in all planes to improve ability to use LUE as assist with simple tasks such as donning a shirt or turning a faucet.    Time 4    Period Weeks    Status On-going      OT SHORT TERM GOAL #5   Title Pt will decrease edema in left hand and digits to improve ability to a full fist to increase ability to hold lightweight objects.    Time 4    Period Weeks    Status On-going             OT Long Term Goals - 01/13/21 1745      OT LONG TERM GOAL #1   Title Pt will decrease pain in LUE to 3/10 or less to improve ability to use LUE as non-dominant during ADLs.    Time 8    Period Weeks    Status On-going  OT LONG TERM GOAL #2   Title Pt will decrease fascial restrictions in LUE to minimal amounts or less to improve mobility required for fixing her hair.    Time 8    Period Weeks    Status On-going      OT LONG TERM GOAL #3   Title Pt will increase left forearm and wrist A/ROM to [facility]  to improve ability to reach for items in various planes and grasp with LUE.    Time 8    Period Weeks    Status On-going      OT LONG TERM GOAL #4   Title Pt will increase LUE strength to 4+/5 to improve ability to lift and carry items such as groceries or pots and pans.    Time 8    Period Weeks    Status On-going      OT LONG TERM GOAL #5   Title Pt will increase left grip strength to 30# and pinch strength to 8# to improve ability to grasp and open items such as bottle tops, jar lids, etc.    Time 8    Period Weeks    Status On-going                 Plan - 01/21/21 0939    Clinical Impression Statement A: Adjustments made to wrist cock up splint to decrease pressure areas along medial and lateral aspect of wrist and forearm. Education provided regarding decreasing zsplint wear time and also gently scrubbing left hand to remove dead skin that has developed. Pt demonstrates a protective arm position at all times although did adjust when cued. Reports increased swelling occuring after previous therapy session of A/ROM and P/ROM exercises. Visually, patient hand does not appear to be swollen although her forearm is present with edema. I do not think she would tolerate an edema glove at this time although a tubular compression sleeve would be appropriate. This will need to be ordered. VC for form and technique were provided during session as needed.    Body Structure / Function / Physical Skills ADL;Endurance;UE functional use;Fascial restriction;Pain;ROM;Coordination;Sensation;IADL;Skin integrity;Dexterity;Strength;Edema;Mobility;Tone    Plan P: Edema massage and manual techniques, P/ROM, A/ROM wrist and hand.    OT Home Exercise Plan eval: retrograde massage, elevation techniques; 4/21: educated on digit/hand A/ROM to begin completing; splint wear and care; continue retrograde massage. 4/28: continue with hand A/ROM, add elbow and wrist A/ROM    Consulted and Agree with Plan of Care  Patient           Patient will benefit from skilled therapeutic intervention in order to improve the following deficits and impairments:   Body Structure / Function / Physical Skills: ADL,Endurance,UE functional use,Fascial restriction,Pain,ROM,Coordination,Sensation,IADL,Skin integrity,Dexterity,Strength,Edema,Mobility,Tone       Visit Diagnosis: Pain in left hand  Pain in left wrist  Stiffness of left wrist, not elsewhere classified  Localized edema  Other symptoms and signs involving the musculoskeletal system  Stiffness of left hand, not elsewhere classified    Problem List Patient Active Problem List   Diagnosis Date Noted  . Subacromial bursitis of right shoulder joint 11/24/2020  . Trochanteric bursitis of right hip 11/24/2020  . Pain of left lower leg 11/15/2020  . Bilateral low back pain with right-sided sciatica 11/15/2020  . Thrush 11/15/2020  . Insomnia 11/19/2018  . Intractable headache 07/18/2018  . Occipital neuralgia 07/18/2018  . Family history of cerebral aneurysm 07/18/2018  . Numbness 04/23/2018  . Cervical radiculopathy 04/23/2018  .  Encounter for long-term use of opiate analgesic 04/22/2018  . Lumbar degenerative disc disease 04/22/2018  . Spondylosis of lumbar spine 04/22/2018  . Fibromyalgia affecting multiple sites 04/22/2018  . Constipation due to pain medication 04/30/2017  . Depression 03/22/2017  . Trochanteric bursitis of left hip 03/13/2017  . Neck pain 11/13/2016  . Left shoulder pain 11/13/2016  . Chronic bilateral low back pain 11/13/2016  . History of colonic polyps 06/20/2016  . IBS (irritable bowel syndrome) 06/20/2016  . Epigastric pain 06/20/2016  . Rhinitis, chronic 04/13/2016  . Otalgia of both ears 04/13/2016  . Arthralgia of right temporomandibular joint 04/13/2016  . BMI 40.0-44.9, adult ([place]) 11/22/2015  . Bilateral carpal tunnel syndrome 11/22/2015  . Tobacco abuse 11/22/2015  . Allergic rhinitis 11/01/2015  .  Gastroesophageal reflux disease 11/01/2015  . LLQ pain 06/18/2015  . Bloating 06/18/2015  . Pain with urination 02/19/2015  . Moody 01/22/2015  . Pelvic pain in female 01/13/2015  . Hot flashes due to surgical menopause 01/13/2015  . Pre-diabetes 01/13/2015  . Hypertension 01/13/2015  . Chronic migraine w/o aura, not intractable, w/o stat migr 01/13/2015  . Psoriasis 01/13/2015  . Arthritis 01/13/2015  . Fibromyalgia 01/13/2015  . COPD (chronic obstructive pulmonary disease) ([place]) 01/13/2015  . Hyperlipidemia 01/13/2015  . Abnormal nuclear stress test 01/07/2014  . Abdominal pain 04/16/2013  . Pain in joint involving multiple sites 02/20/2013  . Head trauma 02/20/2013    Ailene Ravel, OTR/L,CBIS  [phone]  01/21/2021, 9:45 AM  [place] [address] [place], [place], [address] Phone: [phone]   Fax:  [phone]  Name: Judy Evans MRN: [id number] Date of Birth: 1971-09-15

## 2021-01-24 DIAGNOSIS — Z79899 Other long term (current) drug therapy: Secondary | ICD-10-CM | POA: Diagnosis not present

## 2021-01-24 DIAGNOSIS — L409 Psoriasis, unspecified: Secondary | ICD-10-CM | POA: Diagnosis not present

## 2021-01-25 ENCOUNTER — Encounter (HOSPITAL_COMMUNITY): Payer: Self-pay

## 2021-01-25 ENCOUNTER — Ambulatory Visit (HOSPITAL_COMMUNITY): Payer: Medicare HMO | Attending: Orthopedic Surgery

## 2021-01-25 ENCOUNTER — Other Ambulatory Visit: Payer: Self-pay

## 2021-01-25 DIAGNOSIS — R29898 Other symptoms and signs involving the musculoskeletal system: Secondary | ICD-10-CM | POA: Diagnosis present

## 2021-01-25 DIAGNOSIS — M79642 Pain in left hand: Secondary | ICD-10-CM | POA: Diagnosis present

## 2021-01-25 DIAGNOSIS — R600 Localized edema: Secondary | ICD-10-CM | POA: Diagnosis present

## 2021-01-25 DIAGNOSIS — M25632 Stiffness of left wrist, not elsewhere classified: Secondary | ICD-10-CM | POA: Diagnosis present

## 2021-01-25 DIAGNOSIS — M25532 Pain in left wrist: Secondary | ICD-10-CM | POA: Diagnosis present

## 2021-01-25 DIAGNOSIS — M25642 Stiffness of left hand, not elsewhere classified: Secondary | ICD-10-CM

## 2021-01-25 NOTE — Therapy (Signed)
[place], [place], [address] Phone: [phone]   Fax:  [phone]  Occupational Therapy Treatment  Patient Details  Name: Judy Evans MRN: [id number] Date of Birth: 02-14-1971 Referring Provider (OT): Dr. Larena Glassman   Encounter Date: 01/25/2021   OT End of Session - 01/25/21 1125    Visit Number 5    Number of Visits 16    Date for OT Re-Evaluation 03/13/21   mini reassess on 02/09/21   Authorization Type 1) [employer] Medicare, $35 copay 2) Medicaid    Authorization Time Period no visit limit    Progress Note Due on Visit 10    OT Start Time 0900    OT Stop Time 0950    OT Time Calculation (min) 50 min    Activity Tolerance Patient tolerated treatment well;Patient limited by pain    Behavior During Therapy [facility] for tasks assessed/performed           Past Medical History:  Diagnosis Date  . Anxiety   . Aphasia   . Arthritis   . COPD (chronic obstructive pulmonary disease) ([place])   . DDD (degenerative disc disease), cervical   . DDD (degenerative disc disease), lumbar   . Degenerative disc disease at L5-S1 level   . Fibromyalgia   . GERD (gastroesophageal reflux disease)   . H. pylori infection   . Hyperlipidemia    States she does not have  . Hypertension    States she does not have  . IBS (irritable bowel syndrome)   . LLQ pain 06/18/2015  . Migraines   . Moody 01/22/2015  . Pain with urination 02/19/2015  . Psoriasis   . Scoliosis   . Spondylolysis     Past Surgical History:  Procedure Laterality Date  . ABDOMINAL HYSTERECTOMY    . BIOPSY  09/14/2016   Procedure: BIOPSY;  Surgeon: Daneil Dolin, MD;  [facility];  Service: Endoscopy;;  gastric   . CARDIAC CATHETERIZATION  2015   patient reported it to be normal. "I was dying in pain during procedure".   . CARPAL TUNNEL RELEASE Right   . CESAREAN SECTION    . CHOLECYSTECTOMY    . COLONOSCOPY  06/2014   Altamese Dilling DeMason:  normal  . DILATION AND CURETTAGE OF UTERUS    . ESOPHAGOGASTRODUODENOSCOPY  07/2014   Burke Keels: Normal  . ESOPHAGOGASTRODUODENOSCOPY (EGD) WITH PROPOFOL N/A 09/14/2016   Dr. Gala Romney: Normal esophagus status post empiric dilation, erosive gastropathy with biopsies showing chronic inactive gastritis, no H pylori.  Marland Kitchen FOOT SURGERY Bilateral    removal of heel spurs  . MALONEY DILATION  09/14/2016   Procedure: Venia Minks DILATION;  Surgeon: Daneil Dolin, MD;  [facility];  Service: Endoscopy;;  . rotator cuff surgery Right   . TONSILLECTOMY      There were no vitals filed for this visit.   Subjective Assessment - 01/25/21 1106    Currently in Pain? Yes    Pain Score 4     Pain Location Wrist    Pain Orientation Left    Pain Descriptors / Indicators Aching;Sore;Pressure    Pain Type Acute pain    Pain Radiating Towards towards elbow    Pain Onset More than a month ago    Pain Frequency Constant    Aggravating Factors  movement, splint    Pain Relieving Factors rest, soaking in warm water, ice    Effect of Pain on Daily  Activities unable to use LUE for ADL tasks.    Multiple Pain Sites No              [facility] OT Assessment - 01/25/21 1107      Assessment   Medical Diagnosis s/p left distal radius fracture      Precautions   Precautions Other (comment)    Precaution Comments See [place] Handbook Protocol                    OT Treatments/Exercises (OP) - 01/25/21 1107      Exercises   Exercises Wrist;Elbow;Shoulder;Hand      Shoulder Exercises: Supine   Flexion AROM;5 reps      Elbow Exercises   Other elbow exercises Supine with arm extended overhead; elbow extension with prolonged stretch of 3-5 seconds; 10X      Wrist Exercises   Other wrist exercises Completed seated; wrist flexion and extension; AA/ROM with slight pronlonged stretch for 3-5 seconds (provided 2 finger stretch); 10X    Other wrist exercises Completed supine; wrist extension  prolonged stretch with arm extended into full shoulder flexion; proivded 2 finger stretch; 3-4 seconds 5X      Additional Wrist Exercises   Sponges 2,4,7      Splinting   Splinting Adjustments made to wrist cock up splint due to pressure area noted at base of MCP joint of thumb and just inferior to thumb MCP joint.      Manual Therapy   Manual Therapy Edema management;Myofascial release    Manual therapy comments completed separately from therapeutic exercises    Edema Management Edema management technique completed using simple lymphatic drainage to LUE. Starting at shoulder and working towards hand then back to shoulder. Completed several times to decrease edema located in the LUE and increase joint mobility.    Myofascial Release myofascial release and manual stretching to left flexor and extensor forearm, wrist, and hand                  OT Education - 01/25/21 1122    Education Details Discussed trying to go without the splint for the allowed 4-6 hours  during the day to help increase wrist mobility. Verbal instructions to add wrist flexion/extension at home with demonstration completed during session. Provided Tg grip II elasticted tubular support bandadge size D to help decrease edema before being able to tolerate a complete edema glove.    Person(s) Educated Patient    Methods Explanation;Demonstration;Verbal cues    Comprehension Verbalized understanding;Returned demonstration            OT Short Term Goals - 01/13/21 1745      OT SHORT TERM GOAL #1   Title Pt will be provided with and educated on HEP to improve mobility of left wrist and hand required for daily use.    Time 4    Period Weeks    Status On-going    Target Date 02/11/21      OT SHORT TERM GOAL #2   Title Pt will decrease pain in LUE to 5/10 to improve ability to sleep for 2+ hours without waking due to pain.    Time 4    Period Weeks    Status On-going      OT SHORT TERM GOAL #3   Title Pt  will be educated on splint wear and care required for use of daily wrist splint.    Time 1    Period Weeks  Status On-going      OT SHORT TERM GOAL #4   Title Pt will increase forearm, wrist, and digits A/ROM by 15 degrees in all planes to improve ability to use LUE as assist with simple tasks such as donning a shirt or turning a faucet.    Time 4    Period Weeks    Status On-going      OT SHORT TERM GOAL #5   Title Pt will decrease edema in left hand and digits to improve ability to a full fist to increase ability to hold lightweight objects.    Time 4    Period Weeks    Status On-going             OT Long Term Goals - 01/13/21 1745      OT LONG TERM GOAL #1   Title Pt will decrease pain in LUE to 3/10 or less to improve ability to use LUE as non-dominant during ADLs.    Time 8    Period Weeks    Status On-going      OT LONG TERM GOAL #2   Title Pt will decrease fascial restrictions in LUE to minimal amounts or less to improve mobility required for fixing her hair.    Time 8    Period Weeks    Status On-going      OT LONG TERM GOAL #3   Title Pt will increase left forearm and wrist A/ROM to [facility] to improve ability to reach for items in various planes and grasp with LUE.    Time 8    Period Weeks    Status On-going      OT LONG TERM GOAL #4   Title Pt will increase LUE strength to 4+/5 to improve ability to lift and carry items such as groceries or pots and pans.    Time 8    Period Weeks    Status On-going      OT LONG TERM GOAL #5   Title Pt will increase left grip strength to 30# and pinch strength to 8# to improve ability to grasp and open items such as bottle tops, jar lids, etc.    Time 8    Period Weeks    Status On-going                 Plan - 01/25/21 1131    Clinical Impression Statement A: Completed edema management techniques while completing simple lymphatic drainage with education provided to complete at home. Slight adjustments made to  splint to prevent pressure area and soreness. Continued to have some sensitivity after adjustments although when wearing the compression sleeve patient did voice that it helped. After manual techniques and AA/ROM with slow pronlonged stretching, patient was able to achieve full elbow extension (supine) and 30 degrees A/ROM wrist extension and 34 degrees P/ROM wrist flexion. Pt reports that she was able to sleep without her splint for the first time last night.    Body Structure / Function / Physical Skills ADL;Endurance;UE functional use;Fascial restriction;Pain;ROM;Coordination;Sensation;IADL;Skin integrity;Dexterity;Strength;Edema;Mobility;Tone    Plan P: Continue with simple lymphatic drainage while working on increasing P/ROM of forearm, wrist, and hand. Follow up on use of compression sleeve. [name] can make a finger extension splint for nighttime next week. )    OT Home Exercise Plan eval: retrograde massage, elevation techniques; 4/21: educated on digit/hand A/ROM to begin completing; splint wear and care; continue retrograde massage. 4/28: continue with hand A/ROM, add elbow and wrist A/ROM  5/3: wrist and elbow stretches (verbal education)    Consulted and Agree with Plan of Care Patient           Patient will benefit from skilled therapeutic intervention in order to improve the following deficits and impairments:   Body Structure / Function / Physical Skills: ADL,Endurance,UE functional use,Fascial restriction,Pain,ROM,Coordination,Sensation,IADL,Skin integrity,Dexterity,Strength,Edema,Mobility,Tone       Visit Diagnosis: Pain in left wrist  Pain in left hand  Stiffness of left wrist, not elsewhere classified  Localized edema  Other symptoms and signs involving the musculoskeletal system  Stiffness of left hand, not elsewhere classified    Problem List Patient Active Problem List   Diagnosis Date Noted  . Subacromial bursitis of right shoulder joint 11/24/2020  .  Trochanteric bursitis of right hip 11/24/2020  . Pain of left lower leg 11/15/2020  . Bilateral low back pain with right-sided sciatica 11/15/2020  . Thrush 11/15/2020  . Insomnia 11/19/2018  . Intractable headache 07/18/2018  . Occipital neuralgia 07/18/2018  . Family history of cerebral aneurysm 07/18/2018  . Numbness 04/23/2018  . Cervical radiculopathy 04/23/2018  . Encounter for long-term use of opiate analgesic 04/22/2018  . Lumbar degenerative disc disease 04/22/2018  . Spondylosis of lumbar spine 04/22/2018  . Fibromyalgia affecting multiple sites 04/22/2018  . Constipation due to pain medication 04/30/2017  . Depression 03/22/2017  . Trochanteric bursitis of left hip 03/13/2017  . Neck pain 11/13/2016  . Left shoulder pain 11/13/2016  . Chronic bilateral low back pain 11/13/2016  . History of colonic polyps 06/20/2016  . IBS (irritable bowel syndrome) 06/20/2016  . Epigastric pain 06/20/2016  . Rhinitis, chronic 04/13/2016  . Otalgia of both ears 04/13/2016  . Arthralgia of right temporomandibular joint 04/13/2016  . BMI 40.0-44.9, adult ([place]) 11/22/2015  . Bilateral carpal tunnel syndrome 11/22/2015  . Tobacco abuse 11/22/2015  . Allergic rhinitis 11/01/2015  . Gastroesophageal reflux disease 11/01/2015  . LLQ pain 06/18/2015  . Bloating 06/18/2015  . Pain with urination 02/19/2015  . Moody 01/22/2015  . Pelvic pain in female 01/13/2015  . Hot flashes due to surgical menopause 01/13/2015  . Pre-diabetes 01/13/2015  . Hypertension 01/13/2015  . Chronic migraine w/o aura, not intractable, w/o stat migr 01/13/2015  . Psoriasis 01/13/2015  . Arthritis 01/13/2015  . Fibromyalgia 01/13/2015  . COPD (chronic obstructive pulmonary disease) ([place]) 01/13/2015  . Hyperlipidemia 01/13/2015  . Abnormal nuclear stress test 01/07/2014  . Abdominal pain 04/16/2013  . Pain in joint involving multiple sites 02/20/2013  . Head trauma 02/20/2013    Ailene Ravel,  OTR/L,CBIS  [phone]  01/25/2021, 12:56 PM  [facility] [address] [place], [place], [address] Phone: [phone]   Fax:  [phone]  Name: Judy Evans MRN: [id number] Date of Birth: 05-18-1971

## 2021-01-27 ENCOUNTER — Other Ambulatory Visit: Payer: Self-pay

## 2021-01-27 ENCOUNTER — Ambulatory Visit (HOSPITAL_COMMUNITY): Payer: Medicare HMO | Admitting: Specialist

## 2021-01-27 ENCOUNTER — Encounter (HOSPITAL_COMMUNITY): Payer: Self-pay | Admitting: Specialist

## 2021-01-27 DIAGNOSIS — M79642 Pain in left hand: Secondary | ICD-10-CM | POA: Diagnosis not present

## 2021-01-27 DIAGNOSIS — M25642 Stiffness of left hand, not elsewhere classified: Secondary | ICD-10-CM | POA: Diagnosis not present

## 2021-01-27 DIAGNOSIS — R600 Localized edema: Secondary | ICD-10-CM | POA: Diagnosis not present

## 2021-01-27 DIAGNOSIS — M25532 Pain in left wrist: Secondary | ICD-10-CM | POA: Diagnosis not present

## 2021-01-27 DIAGNOSIS — R29898 Other symptoms and signs involving the musculoskeletal system: Secondary | ICD-10-CM | POA: Diagnosis not present

## 2021-01-27 DIAGNOSIS — M25632 Stiffness of left wrist, not elsewhere classified: Secondary | ICD-10-CM | POA: Diagnosis not present

## 2021-01-27 NOTE — Therapy (Signed)
[place], [place], [address] Phone: [phone]   Fax:  [phone]  Occupational Therapy Treatment  Patient Details  Name: Judy Evans MRN: [id number] Date of Birth: 06-16-1971 Referring Provider (OT): Dr. Larena Glassman   Encounter Date: 01/27/2021   OT End of Session - 01/27/21 1430    Visit Number 6    Number of Visits 16    Date for OT Re-Evaluation 03/13/21    Authorization Type 1) [employer] Medicare, $35 copay 2) Medicaid    Authorization Time Period no visit limit    Progress Note Due on Visit 10    OT Start Time 1310    OT Stop Time 1355    OT Time Calculation (min) 45 min    Activity Tolerance Patient tolerated treatment well;Patient limited by pain    Behavior During Therapy [name] for tasks assessed/performed           Past Medical History:  Diagnosis Date  . Anxiety   . Aphasia   . Arthritis   . COPD (chronic obstructive pulmonary disease) ([place])   . DDD (degenerative disc disease), cervical   . DDD (degenerative disc disease), lumbar   . Degenerative disc disease at L5-S1 level   . Fibromyalgia   . GERD (gastroesophageal reflux disease)   . H. pylori infection   . Hyperlipidemia    States she does not have  . Hypertension    States she does not have  . IBS (irritable bowel syndrome)   . LLQ pain 06/18/2015  . Migraines   . Moody 01/22/2015  . Pain with urination 02/19/2015  . Psoriasis   . Scoliosis   . Spondylolysis     Past Surgical History:  Procedure Laterality Date  . ABDOMINAL HYSTERECTOMY    . BIOPSY  09/14/2016   Procedure: BIOPSY;  Surgeon: Daneil Dolin, MD;  [facility];  Service: Endoscopy;;  gastric   . CARDIAC CATHETERIZATION  2015   patient reported it to be normal. "I was dying in pain during procedure".   . CARPAL TUNNEL RELEASE Right   . CESAREAN SECTION    . CHOLECYSTECTOMY    . COLONOSCOPY  06/2014   Altamese Dilling DeMason: normal  . DILATION AND  CURETTAGE OF UTERUS    . ESOPHAGOGASTRODUODENOSCOPY  07/2014   Burke Keels: Normal  . ESOPHAGOGASTRODUODENOSCOPY (EGD) WITH PROPOFOL N/A 09/14/2016   Dr. Gala Romney: Normal esophagus status post empiric dilation, erosive gastropathy with biopsies showing chronic inactive gastritis, no H pylori.  Marland Kitchen FOOT SURGERY Bilateral    removal of heel spurs  . MALONEY DILATION  09/14/2016   Procedure: Venia Minks DILATION;  Surgeon: Daneil Dolin, MD;  [facility];  Service: Endoscopy;;  . rotator cuff surgery Right   . TONSILLECTOMY      There were no vitals filed for this visit.   Subjective Assessment - 01/27/21 1427    Subjective  S:  That sleeve that Judy Evans gave me was too tight, my arm didnt swell but my hand did.    Currently in Pain? Yes    Pain Score 4     Pain Location Wrist    Pain Orientation Left    Pain Descriptors / Indicators Aching;Sore;Pressure    Pain Type Acute pain    Pain Onset More than a month ago              [facility] OT Assessment - 01/27/21 0001  Assessment   Medical Diagnosis s/p left distal radius fracture    Referring Provider (OT) Dr. Larena Glassman      Precautions   Precautions Other (comment)    Precaution Comments See Judy Evans Handbook Protocol                    OT Treatments/Exercises (OP) - 01/27/21 0001      ADLs   ADL Comments issued a size large edema glove for left hand.  the glove was slightly too large on client, however I do feel this will allow her to adjust to having an edema garment in place, if she is able to tolerate the edema glove over the weekend, we can attempt to grade down to a medium glove for increased compression at next session.      Exercises   Exercises Wrist;Elbow;Shoulder;Hand      Elbow Exercises   Other elbow exercises supination/pronation, aa/rom and a/rom 10 times each      Wrist Exercises   Other wrist exercises Completed seated; wrist flexion and extension; AA/ROM with slight pronlonged stretch  for 3-5 seconds (provided 2 finger stretch); 10X      Hand Exercises   MCPJ Extension AROM;5 reps   all digits   Joint Blocking Exercises passive and then compositie flex and hold 3 times each digit    Thumb Opposition to index, long, ring finger x 5    Digit Abduction/Adduction x5    Other Hand Exercises paitent able to pick up large pegs and manipulate with digits to place pegs in pegboard x 15 repetitions.  Patient then able to remove peg, translate to palm and then pick up another peg and translate to palm with 2 max at a time in her hand      Manual Therapy   Manual Therapy Myofascial release    Manual therapy comments completed separately from therapeutic exercises    Myofascial Release myofascial release and manual stretching to left flexor and extensor forearm, wrist, and hand                  OT Education - 01/27/21 1428    Education Details issued a size large edema glove for left hand.  the glove was slightly too large on client, however I do feel this will allow her to adjust to having an edema garment in place, if she is able to tolerate the edema glove over the weekend, we can attempt to grade down to a medium glove for increased compression at next session.  educated patient to wear as much as possible, removing for hygiene purposes, reviewed contraindications and donning and doffing methods.    Person(s) Educated Patient    Methods Explanation;Demonstration;Verbal cues    Comprehension Verbalized understanding;Returned demonstration            OT Short Term Goals - 01/13/21 1745      OT SHORT TERM GOAL #1   Title Pt will be provided with and educated on HEP to improve mobility of left wrist and hand required for daily use.    Time 4    Period Weeks    Status On-going    Target Date 02/11/21      OT SHORT TERM GOAL #2   Title Pt will decrease pain in LUE to 5/10 to improve ability to sleep for 2+ hours without waking due to pain.    Time 4    Period Weeks     Status On-going  OT SHORT TERM GOAL #3   Title Pt will be educated on splint wear and care required for use of daily wrist splint.    Time 1    Period Weeks    Status On-going      OT SHORT TERM GOAL #4   Title Pt will increase forearm, wrist, and digits A/ROM by 15 degrees in all planes to improve ability to use LUE as assist with simple tasks such as donning a shirt or turning a faucet.    Time 4    Period Weeks    Status On-going      OT SHORT TERM GOAL #5   Title Pt will decrease edema in left hand and digits to improve ability to a full fist to increase ability to hold lightweight objects.    Time 4    Period Weeks    Status On-going             OT Long Term Goals - 01/13/21 1745      OT LONG TERM GOAL #1   Title Pt will decrease pain in LUE to 3/10 or less to improve ability to use LUE as non-dominant during ADLs.    Time 8    Period Weeks    Status On-going      OT LONG TERM GOAL #2   Title Pt will decrease fascial restrictions in LUE to minimal amounts or less to improve mobility required for fixing her hair.    Time 8    Period Weeks    Status On-going      OT LONG TERM GOAL #3   Title Pt will increase left forearm and wrist A/ROM to [facility] to improve ability to reach for items in various planes and grasp with LUE.    Time 8    Period Weeks    Status On-going      OT LONG TERM GOAL #4   Title Pt will increase LUE strength to 4+/5 to improve ability to lift and carry items such as groceries or pots and pans.    Time 8    Period Weeks    Status On-going      OT LONG TERM GOAL #5   Title Pt will increase left grip strength to 30# and pinch strength to 8# to improve ability to grasp and open items such as bottle tops, jar lids, etc.    Time 8    Period Weeks    Status On-going                 Plan - 01/27/21 1431    Clinical Impression Statement A:  Patient reports inability to wear compression sleeve issued at last session.  OT issued size  large edema glove and patient able to don easily.  Paitent to trial size large glove over weekend and if can tolerate with issue a medium for increased compression at next session.  Tolerated slight stretching to wrist and hand during manual therapy this date.  able to place and remove 15 large pegs, using digits to maneuver pegs.  patient able to remove 2 pegs and translate to palm and hold before dropping.    Body Structure / Function / Physical Skills ADL;Endurance;UE functional use;Fascial restriction;Pain;ROM;Coordination;Sensation;IADL;Skin integrity;Dexterity;Strength;Edema;Mobility;Tone    Plan P:  Follow up on edema glove tolerance and if able issue medium for improved compression.  fabricate digit extension splint.  continue to improve mobility needed for use of left hand with functional activties.  Patient will benefit from skilled therapeutic intervention in order to improve the following deficits and impairments:   Body Structure / Function / Physical Skills: ADL,Endurance,UE functional use,Fascial restriction,Pain,ROM,Coordination,Sensation,IADL,Skin integrity,Dexterity,Strength,Edema,Mobility,Tone       Visit Diagnosis: Pain in left wrist  Pain in left hand  Stiffness of left wrist, not elsewhere classified  Localized edema    Problem List Patient Active Problem List   Diagnosis Date Noted  . Subacromial bursitis of right shoulder joint 11/24/2020  . Trochanteric bursitis of right hip 11/24/2020  . Pain of left lower leg 11/15/2020  . Bilateral low back pain with right-sided sciatica 11/15/2020  . Thrush 11/15/2020  . Insomnia 11/19/2018  . Intractable headache 07/18/2018  . Occipital neuralgia 07/18/2018  . Family history of cerebral aneurysm 07/18/2018  . Numbness 04/23/2018  . Cervical radiculopathy 04/23/2018  . Encounter for long-term use of opiate analgesic 04/22/2018  . Lumbar degenerative disc disease 04/22/2018  . Spondylosis of lumbar spine  04/22/2018  . Fibromyalgia affecting multiple sites 04/22/2018  . Constipation due to pain medication 04/30/2017  . Depression 03/22/2017  . Trochanteric bursitis of left hip 03/13/2017  . Neck pain 11/13/2016  . Left shoulder pain 11/13/2016  . Chronic bilateral low back pain 11/13/2016  . History of colonic polyps 06/20/2016  . IBS (irritable bowel syndrome) 06/20/2016  . Epigastric pain 06/20/2016  . Rhinitis, chronic 04/13/2016  . Otalgia of both ears 04/13/2016  . Arthralgia of right temporomandibular joint 04/13/2016  . BMI 40.0-44.9, adult ([place]) 11/22/2015  . Bilateral carpal tunnel syndrome 11/22/2015  . Tobacco abuse 11/22/2015  . Allergic rhinitis 11/01/2015  . Gastroesophageal reflux disease 11/01/2015  . LLQ pain 06/18/2015  . Bloating 06/18/2015  . Pain with urination 02/19/2015  . Moody 01/22/2015  . Pelvic pain in female 01/13/2015  . Hot flashes due to surgical menopause 01/13/2015  . Pre-diabetes 01/13/2015  . Hypertension 01/13/2015  . Chronic migraine w/o aura, not intractable, w/o stat migr 01/13/2015  . Psoriasis 01/13/2015  . Arthritis 01/13/2015  . Fibromyalgia 01/13/2015  . COPD (chronic obstructive pulmonary disease) ([place]) 01/13/2015  . Hyperlipidemia 01/13/2015  . Abnormal nuclear stress test 01/07/2014  . Abdominal pain 04/16/2013  . Pain in joint involving multiple sites 02/20/2013  . Head trauma 02/20/2013    Vangie Bicker, [place], OTR/L [phone]  01/27/2021, 2:39 PM  [facility] [address] [place], [place], [address] Phone: [phone]   Fax:  [phone]  Name: Judy Evans MRN: [id number] Date of Birth: [birth year]

## 2021-01-28 ENCOUNTER — Encounter (HOSPITAL_COMMUNITY): Payer: Self-pay | Admitting: Occupational Therapy

## 2021-01-28 ENCOUNTER — Ambulatory Visit (HOSPITAL_COMMUNITY): Payer: Medicare HMO | Admitting: Occupational Therapy

## 2021-01-28 DIAGNOSIS — M25632 Stiffness of left wrist, not elsewhere classified: Secondary | ICD-10-CM

## 2021-01-28 DIAGNOSIS — M25532 Pain in left wrist: Secondary | ICD-10-CM | POA: Diagnosis not present

## 2021-01-28 DIAGNOSIS — M79642 Pain in left hand: Secondary | ICD-10-CM

## 2021-01-28 DIAGNOSIS — M25642 Stiffness of left hand, not elsewhere classified: Secondary | ICD-10-CM

## 2021-01-28 DIAGNOSIS — R29898 Other symptoms and signs involving the musculoskeletal system: Secondary | ICD-10-CM

## 2021-01-28 DIAGNOSIS — R600 Localized edema: Secondary | ICD-10-CM

## 2021-01-28 NOTE — Therapy (Signed)
[place], [place], [address] Phone: [phone]   Fax:  [phone]  Occupational Therapy Treatment  Patient Details  Name: Judy Evans MRN: [id number] Date of Birth: 17-May-1971 Referring Provider (OT): Dr. Larena Glassman   Encounter Date: 01/28/2021   OT End of Session - 01/28/21 1034    Visit Number 7    Number of Visits 16    Date for OT Re-Evaluation 03/13/21    Authorization Type 1) [employer], $35 copay 2) Medicaid    Authorization Time Period no visit limit    Progress Note Due on Visit 10    OT Start Time 0950    OT Stop Time 1030    OT Time Calculation (min) 40 min    Activity Tolerance Patient tolerated treatment well;Patient limited by pain    Behavior During Therapy [facility] for tasks assessed/performed           Past Medical History:  Diagnosis Date  . Anxiety   . Aphasia   . Arthritis   . COPD (chronic obstructive pulmonary disease) ([place])   . DDD (degenerative disc disease), cervical   . DDD (degenerative disc disease), lumbar   . Degenerative disc disease at L5-S1 level   . Fibromyalgia   . GERD (gastroesophageal reflux disease)   . H. pylori infection   . Hyperlipidemia    States she does not have  . Hypertension    States she does not have  . IBS (irritable bowel syndrome)   . LLQ pain 06/18/2015  . Migraines   . Moody 01/22/2015  . Pain with urination 02/19/2015  . Psoriasis   . Scoliosis   . Spondylolysis     Past Surgical History:  Procedure Laterality Date  . ABDOMINAL HYSTERECTOMY    . BIOPSY  09/14/2016   Procedure: BIOPSY;  Surgeon: Daneil Dolin, MD;  [facility]: [facility];  Service: Endoscopy;;  gastric   . CARDIAC CATHETERIZATION  2015   patient reported it to be normal. "I was dying in pain during procedure".   . CARPAL TUNNEL RELEASE Right   . CESAREAN SECTION    . CHOLECYSTECTOMY    . COLONOSCOPY  06/2014   Altamese Dilling DeMason: normal  . DILATION AND  CURETTAGE OF UTERUS    . ESOPHAGOGASTRODUODENOSCOPY  07/2014   Burke Keels: Normal  . ESOPHAGOGASTRODUODENOSCOPY (EGD) WITH PROPOFOL N/A 09/14/2016   Dr. Gala Romney: Normal esophagus status post empiric dilation, erosive gastropathy with biopsies showing chronic inactive gastritis, no H pylori.  Marland Kitchen FOOT SURGERY Bilateral    removal of heel spurs  . MALONEY DILATION  09/14/2016   Procedure: Venia Minks DILATION;  Surgeon: Daneil Dolin, MD;  Location: [facility];  Service: Endoscopy;;  . rotator cuff surgery Right   . TONSILLECTOMY      There were no vitals filed for this visit.   Subjective Assessment - 01/28/21 0950    Subjective  S: I did have some swelling but not as bad as with the other sleeve.    Currently in Pain? No/denies              [facility] OT Assessment - 01/28/21 0950      Assessment   Medical Diagnosis s/p left distal radius fracture      Precautions   Precautions Other (comment)    Precaution Comments See [place] Protocol  OT Treatments/Exercises (OP) - 01/28/21 0952      Exercises   Exercises Wrist;Elbow;Shoulder;Hand      Shoulder Exercises: Supine   Protraction AROM;10 reps    Flexion AROM;10 reps      Elbow Exercises   Forearm Supination PROM;AROM;10 reps    Forearm Pronation PROM;AROM;10 reps      Wrist Exercises   Wrist Flexion PROM;AROM;10 reps    Wrist Extension PROM;AROM;10 reps    Wrist Radial Deviation PROM;AROM;10 reps    Wrist Ulnar Deviation PROM;AROM;10 reps      Hand Exercises   Joint Blocking Exercises passive and then compositie flex and hold 3 times each digit    Thumb Opposition to index, long, ring finger x 5    Other Hand Exercises composite digit flexion/extension, P/ROM, A/ROM, 5X each      Manual Therapy   Manual Therapy Myofascial release;Edema management    Manual therapy comments completed separately from therapeutic exercises    Edema Management Edema management technique completed  using simple lymphatic drainage to LUE. Starting at shoulder and working towards hand then back to shoulder. Completed several times to decrease edema located in the LUE and increase joint mobility.    Myofascial Release myofascial release and manual stretching to left flexor and extensor forearm, wrist, and hand                    OT Short Term Goals - 01/13/21 1745      OT SHORT TERM GOAL #1   Title Pt will be provided with and educated on HEP to improve mobility of left wrist and hand required for daily use.    Time 4    Period Weeks    Status On-going    Target Date 02/11/21      OT SHORT TERM GOAL #2   Title Pt will decrease pain in LUE to 5/10 to improve ability to sleep for 2+ hours without waking due to pain.    Time 4    Period Weeks    Status On-going      OT SHORT TERM GOAL #3   Title Pt will be educated on splint wear and care required for use of daily wrist splint.    Time 1    Period Weeks    Status On-going      OT SHORT TERM GOAL #4   Title Pt will increase forearm, wrist, and digits A/ROM by 15 degrees in all planes to improve ability to use LUE as assist with simple tasks such as donning a shirt or turning a faucet.    Time 4    Period Weeks    Status On-going      OT SHORT TERM GOAL #5   Title Pt will decrease edema in left hand and digits to improve ability to a full fist to increase ability to hold lightweight objects.    Time 4    Period Weeks    Status On-going             OT Long Term Goals - 01/13/21 1745      OT LONG TERM GOAL #1   Title Pt will decrease pain in LUE to 3/10 or less to improve ability to use LUE as non-dominant during ADLs.    Time 8    Period Weeks    Status On-going      OT LONG TERM GOAL #2   Title Pt will decrease fascial restrictions in LUE to minimal  amounts or less to improve mobility required for fixing her hair.    Time 8    Period Weeks    Status On-going      OT LONG TERM GOAL #3   Title Pt will  increase left forearm and wrist A/ROM to [facility] to improve ability to reach for items in various planes and grasp with LUE.    Time 8    Period Weeks    Status On-going      OT LONG TERM GOAL #4   Title Pt will increase LUE strength to 4+/5 to improve ability to lift and carry items such as groceries or pots and pans.    Time 8    Period Weeks    Status On-going      OT LONG TERM GOAL #5   Title Pt will increase left grip strength to 30# and pinch strength to 8# to improve ability to grasp and open items such as bottle tops, jar lids, etc.    Time 8    Period Weeks    Status On-going                 Plan - 01/28/21 1034    Clinical Impression Statement A: Pt reports swelling in fingers last night, OT notes indentions from edema glove along digits and wrist, therefore did not provide medium size yet. Continued with myofascial release and edema management techniques, completed manual therapy and exercises in supine today for improved relaxation and joint mobility. Pt did well with ROM exercises, continues to be limited in digit mobility. Verbal cuing for form and technique during exercises.    Body Structure / Function / Physical Skills ADL;Endurance;UE functional use;Fascial restriction;Pain;ROM;Coordination;Sensation;IADL;Skin integrity;Dexterity;Strength;Edema;Mobility;Tone    Plan P: Fabricate digit extension splint, continue to work on improved mobility of hand, wrist, and digits    OT Home Exercise Plan eval: retrograde massage, elevation techniques; 4/21: educated on digit/hand A/ROM to begin completing; splint wear and care; continue retrograde massage. 4/28: continue with hand A/ROM, add elbow and wrist A/ROM 5/3: wrist and elbow stretches (verbal education)    Consulted and Agree with Plan of Care Patient           Patient will benefit from skilled therapeutic intervention in order to improve the following deficits and impairments:   Body Structure / Function / Physical  Skills: ADL,Endurance,UE functional use,Fascial restriction,Pain,ROM,Coordination,Sensation,IADL,Skin integrity,Dexterity,Strength,Edema,Mobility,Tone       Visit Diagnosis: Pain in left wrist  Pain in left hand  Stiffness of left wrist, not elsewhere classified  Localized edema  Other symptoms and signs involving the musculoskeletal system  Stiffness of left hand, not elsewhere classified    Problem List Patient Active Problem List   Diagnosis Date Noted  . Subacromial bursitis of right shoulder joint 11/24/2020  . Trochanteric bursitis of right hip 11/24/2020  . Pain of left lower leg 11/15/2020  . Bilateral low back pain with right-sided sciatica 11/15/2020  . Thrush 11/15/2020  . Insomnia 11/19/2018  . Intractable headache 07/18/2018  . Occipital neuralgia 07/18/2018  . Family history of cerebral aneurysm 07/18/2018  . Numbness 04/23/2018  . Cervical radiculopathy 04/23/2018  . Encounter for long-term use of opiate analgesic 04/22/2018  . Lumbar degenerative disc disease 04/22/2018  . Spondylosis of lumbar spine 04/22/2018  . Fibromyalgia affecting multiple sites 04/22/2018  . Constipation due to pain medication 04/30/2017  . Depression 03/22/2017  . Trochanteric bursitis of left hip 03/13/2017  . Neck pain 11/13/2016  . Left shoulder pain  11/13/2016  . Chronic bilateral low back pain 11/13/2016  . History of colonic polyps 06/20/2016  . IBS (irritable bowel syndrome) 06/20/2016  . Epigastric pain 06/20/2016  . Rhinitis, chronic 04/13/2016  . Otalgia of both ears 04/13/2016  . Arthralgia of right temporomandibular joint 04/13/2016  . BMI 40.0-44.9, adult ([place]) 11/22/2015  . Bilateral carpal tunnel syndrome 11/22/2015  . Tobacco abuse 11/22/2015  . Allergic rhinitis 11/01/2015  . Gastroesophageal reflux disease 11/01/2015  . LLQ pain 06/18/2015  . Bloating 06/18/2015  . Pain with urination 02/19/2015  . Moody 01/22/2015  . Pelvic pain in female 01/13/2015   . Hot flashes due to surgical menopause 01/13/2015  . Pre-diabetes 01/13/2015  . Hypertension 01/13/2015  . Chronic migraine w/o aura, not intractable, w/o stat migr 01/13/2015  . Psoriasis 01/13/2015  . Arthritis 01/13/2015  . Fibromyalgia 01/13/2015  . COPD (chronic obstructive pulmonary disease) ([place]) 01/13/2015  . Hyperlipidemia 01/13/2015  . Abnormal nuclear stress test 01/07/2014  . Abdominal pain 04/16/2013  . Pain in joint involving multiple sites 02/20/2013  . Head trauma 02/20/2013   Guadelupe Sabin, OTR/L  [phone] 01/28/2021, 11:41 AM  [facility] [address] [place], [place], [address] Phone: [phone]   Fax:  [phone]  Name: Judy Evans MRN: [id number] Date of Birth: [birth year]

## 2021-02-01 ENCOUNTER — Ambulatory Visit (HOSPITAL_COMMUNITY): Payer: Medicare HMO

## 2021-02-02 ENCOUNTER — Encounter: Payer: Self-pay | Admitting: Orthopedic Surgery

## 2021-02-02 ENCOUNTER — Ambulatory Visit (INDEPENDENT_AMBULATORY_CARE_PROVIDER_SITE_OTHER): Payer: Medicare HMO | Admitting: Orthopedic Surgery

## 2021-02-02 ENCOUNTER — Other Ambulatory Visit: Payer: Self-pay

## 2021-02-02 VITALS — Ht 62.0 in | Wt 195.0 lb

## 2021-02-02 DIAGNOSIS — M24542 Contracture, left hand: Secondary | ICD-10-CM

## 2021-02-02 MED ORDER — CYCLOBENZAPRINE HCL 10 MG PO TABS: 10.0000 mg | ORAL_TABLET | Freq: Two times a day (BID) | ORAL | 0 refills | Status: DC | PRN

## 2021-02-02 NOTE — Progress Notes (Signed)
Orthopaedic Clinic Return  Assessment: Judy Evans is a 50 y.o. RHD female with the following: Left distal radius fracture, with dorsal angulation; nonoperative management in a cast Right, nondisplaced medial malleolus fracture; nonoperative management in a CAM boot Left, nondisplaced lateral malleolus fracture; nonoperative management in a CAM boot  Plan: She continues to improve overall.  Her bilateral ankle fractures are better.  She uses a cane to assist with ambulation, but states she does not need it.  Her distal radius fracture is improving, including the pain and range of motion.  However, she is having considerable difficulty with her left index, ring and long fingers.  More specifically, she is finding it very difficult to flex the index finger.  Her long and ring fingers remain in a flexed position.  Passively, it is very painful to try and increase the flexion and extension of her digits.  As a result, I recommended a referral to a hand specialist for further evaluation.  She states her understanding, and is interested in the referral.  Otherwise, she should continue working with the occupational therapist to improve swelling and range of motion about her wrist and hand.  I am happy to see her in follow-up at any time for either her wrist or either ankle.  I have also provided her with a refill of Flexeril, she states this does improve some of her pain.  Otherwise, follow-up as needed.   Follow-up: No follow-ups on file.   Subjective:  Chief Complaint  Patient presents with  . Fracture    Left radial fx DOI 11/24/20     History of Present Illness: Judy Evans is a 50 y.o. female who returns to clinic for repeat evaluation.  She is doing better.  Her pain has improved.  She is ambulating with minimal assistance.  She states she does not need to use her cane on a regular basis.  She is been working diligently with occupational therapy and range of motion and swelling  improvements of her left wrist and her hand.  She continues to have stiffness of the index long and ring fingers.  Specifically, she is unable to flex her index finger.  She is unable to extend her long and ring fingers.  Passive movement of these joints is very painful for her.  She has been taking Flexeril for pain, and this helps.  She states that the therapists will try and improve the swelling in her hand, and then work on the range of motion but this is extremely painful.  At this point, the fingers appear to be quite rigid.    Review of Systems: No fevers or chills No numbness, tingling No chest pain No shortness of breath No bowel or bladder dysfunction No GI distress No headaches    Objective: Ht 5\' 2"  (1.575 m)   Wt 195 lb (88.5 kg)   BMI 35.67 kg/m   Physical Exam:  Alert and oriented.  No acute distress.   Ambulates with the assistance of a cane, and minimal assist.   Bilateral ankles with minimal swelling.  Minimal tenderness is appreciated.  She is active range of motion without deficits.  Left wrist and forearm demonstrates improved swelling.  She does have some tenderness to palpation of the distal radius.  She is able to flex and extend her wrist, with a total arc of approximately 40 degrees.  She has very limited range of motion of her fingers.  The flexor and extensor tendons appear to be  intact.  There is no obvious injury at the PIP joint to the ring and the long finger.  Nonetheless, the PIP joint to the ring and the long fingers remain fixed at approximately 75 degrees.  Passively, unable to extend the PIP joint further.  Index finger is stiff, and remains in an extended position.  Unable to flex the index finger without significant pain.  Fingers are warm and well-perfused.  Sensation is intact throughout the hand.  Previous skin tears have healed very well.   IMAGING: I personally ordered and reviewed the following images:  No new imaging obtained  today.  Mordecai Rasmussen, MD 02/02/2021 11:24 AM

## 2021-02-03 ENCOUNTER — Other Ambulatory Visit: Payer: Self-pay | Admitting: Nurse Practitioner

## 2021-02-03 ENCOUNTER — Other Ambulatory Visit: Payer: Self-pay | Admitting: Neurology

## 2021-02-03 ENCOUNTER — Ambulatory Visit (HOSPITAL_COMMUNITY): Payer: Medicare HMO | Admitting: Specialist

## 2021-02-03 ENCOUNTER — Encounter (HOSPITAL_COMMUNITY): Payer: Self-pay | Admitting: Specialist

## 2021-02-03 DIAGNOSIS — M25632 Stiffness of left wrist, not elsewhere classified: Secondary | ICD-10-CM

## 2021-02-03 DIAGNOSIS — M25642 Stiffness of left hand, not elsewhere classified: Secondary | ICD-10-CM

## 2021-02-03 DIAGNOSIS — M79642 Pain in left hand: Secondary | ICD-10-CM | POA: Diagnosis not present

## 2021-02-03 DIAGNOSIS — J309 Allergic rhinitis, unspecified: Secondary | ICD-10-CM

## 2021-02-03 DIAGNOSIS — R600 Localized edema: Secondary | ICD-10-CM

## 2021-02-03 DIAGNOSIS — R29898 Other symptoms and signs involving the musculoskeletal system: Secondary | ICD-10-CM

## 2021-02-03 DIAGNOSIS — M25532 Pain in left wrist: Secondary | ICD-10-CM

## 2021-02-03 NOTE — Therapy (Signed)
[facility] [address] [place], [place], [address] Phone: [phone]   Fax:  [phone]  Occupational Therapy Treatment  Patient Details  Name: Judy Evans MRN: [id number] Date of Birth: 1971-08-05 Referring Provider (OT): Dr. Larena Glassman   Encounter Date: 02/03/2021   OT End of Session - 02/03/21 1108    Visit Number 8    Number of Visits 16    Date for OT Re-Evaluation 03/13/21    Authorization Type 1) [employer] Medicare, $35 copay 2) Medicaid    Authorization Time Period no visit limit    Progress Note Due on Visit 10    OT Start Time 0901    OT Stop Time 0940    OT Time Calculation (min) 39 min    Activity Tolerance Patient tolerated treatment well;Patient limited by pain    Behavior During Therapy [facility] for tasks assessed/performed           Past Medical History:  Diagnosis Date  . Anxiety   . Aphasia   . Arthritis   . COPD (chronic obstructive pulmonary disease) ([place])   . DDD (degenerative disc disease), cervical   . DDD (degenerative disc disease), lumbar   . Degenerative disc disease at L5-S1 level   . Fibromyalgia   . GERD (gastroesophageal reflux disease)   . H. pylori infection   . Hyperlipidemia    States she does not have  . Hypertension    States she does not have  . IBS (irritable bowel syndrome)   . LLQ pain 06/18/2015  . Migraines   . Moody 01/22/2015  . Pain with urination 02/19/2015  . Psoriasis   . Scoliosis   . Spondylolysis     Past Surgical History:  Procedure Laterality Date  . ABDOMINAL HYSTERECTOMY    . BIOPSY  09/14/2016   Procedure: BIOPSY;  Surgeon: Daneil Dolin, MD;  [facility];  Service: Endoscopy;;  gastric   . CARDIAC CATHETERIZATION  2015   patient reported it to be normal. "I was dying in pain during procedure".   . CARPAL TUNNEL RELEASE Right   . CESAREAN SECTION    . CHOLECYSTECTOMY    . COLONOSCOPY  06/2014   Altamese Dilling DeMason: normal  . DILATION AND  CURETTAGE OF UTERUS    . ESOPHAGOGASTRODUODENOSCOPY  07/2014   Burke Keels: Normal  . ESOPHAGOGASTRODUODENOSCOPY (EGD) WITH PROPOFOL N/A 09/14/2016   Dr. Gala Romney: Normal esophagus status post empiric dilation, erosive gastropathy with biopsies showing chronic inactive gastritis, no H pylori.  Marland Kitchen FOOT SURGERY Bilateral    removal of heel spurs  . MALONEY DILATION  09/14/2016   Procedure: Venia Minks DILATION;  Surgeon: Daneil Dolin, MD;  [facility];  Service: Endoscopy;;  . rotator cuff surgery Right   . TONSILLECTOMY      There were no vitals filed for this visit.   Subjective Assessment - 02/03/21 1101    Subjective  S:  I have to go to hand specialist and Dr. Amedeo Kinsman wants me to keep doing therapy.  This splint is pushing on my forearm where there are bumps in it and the strap leaves a mark and it is tight.    Currently in Pain? Yes    Pain Score 6     Pain Location Wrist    Pain Orientation Left    Pain Descriptors / Indicators Aching;Pressure;Throbbing              [facility] OT Assessment -  02/03/21 0001      Assessment   Medical Diagnosis s/p left distal radius fracture      Precautions   Precautions Other (comment)    Precaution Comments See [place] Handbook Protocol                    OT Treatments/Exercises (OP) - 02/03/21 0001      Exercises   Exercises Hand;Wrist      Wrist Exercises   Other wrist exercises Completed seated; wrist flexion and extension; AA/ROM with slight pronlonged stretch for 3-5 seconds (provided 2 finger stretch); 10X    Other wrist exercises Completed supine; wrist extension prolonged stretch with arm extended into full shoulder flexion; proivded 2 finger stretch; 3-4 seconds 5X      Hand Exercises   Other Hand Exercises completed 5 rounds of 1 min scrubbing standing at table and 1' standing with bag with 1/2# weight in bag to her side, swinging gently to simulate walking.  completed to attempt to decrease pain and begin  to functionally use her left arm and hand.      Splinting   Splinting added moleskin to inside of forearm and wrist portion of splint for increased comfort.  added soft d ring straps for less pressure on dorsal forearm when edema increases.      Manual Therapy   Manual Therapy Myofascial release;Edema management    Manual therapy comments completed separately from therapeutic exercises    Edema Management issued additional size large edema glove Right turned inside out to avoid pressure from seams on patient's hand and wrist    Myofascial Release myofascial release and manual stretching to left flexor and extensor forearm, wrist, and hand                  OT Education - 02/03/21 1106    Education Details issued a second large edema glove right, instructed patient to turn the glove inside out so that seams were not putting extra pressure on her hand.  straps of splint replaced with d ring soft straps to determine if this will be more comfortable to patient.  Discussed possibility of nerve endings not responding to stimuli appropriately and educated patient on scrubbing/carrying protocol that we trialed in clinic this date.    Person(s) Educated Patient    Methods Explanation    Comprehension Verbalized understanding;Returned demonstration            OT Short Term Goals - 01/13/21 1745      OT SHORT TERM GOAL #1   Title Pt will be provided with and educated on HEP to improve mobility of left wrist and hand required for daily use.    Time 4    Period Weeks    Status On-going    Target Date 02/11/21      OT SHORT TERM GOAL #2   Title Pt will decrease pain in LUE to 5/10 to improve ability to sleep for 2+ hours without waking due to pain.    Time 4    Period Weeks    Status On-going      OT SHORT TERM GOAL #3   Title Pt will be educated on splint wear and care required for use of daily wrist splint.    Time 1    Period Weeks    Status On-going      OT SHORT TERM GOAL #4    Title Pt will increase forearm, wrist, and digits A/ROM by 15 degrees  in all planes to improve ability to use LUE as assist with simple tasks such as donning a shirt or turning a faucet.    Time 4    Period Weeks    Status On-going      OT SHORT TERM GOAL #5   Title Pt will decrease edema in left hand and digits to improve ability to a full fist to increase ability to hold lightweight objects.    Time 4    Period Weeks    Status On-going             OT Long Term Goals - 01/13/21 1745      OT LONG TERM GOAL #1   Title Pt will decrease pain in LUE to 3/10 or less to improve ability to use LUE as non-dominant during ADLs.    Time 8    Period Weeks    Status On-going      OT LONG TERM GOAL #2   Title Pt will decrease fascial restrictions in LUE to minimal amounts or less to improve mobility required for fixing her hair.    Time 8    Period Weeks    Status On-going      OT LONG TERM GOAL #3   Title Pt will increase left forearm and wrist A/ROM to [facility] to improve ability to reach for items in various planes and grasp with LUE.    Time 8    Period Weeks    Status On-going      OT LONG TERM GOAL #4   Title Pt will increase LUE strength to 4+/5 to improve ability to lift and carry items such as groceries or pots and pans.    Time 8    Period Weeks    Status On-going      OT LONG TERM GOAL #5   Title Pt will increase left grip strength to 30# and pinch strength to 8# to improve ability to grasp and open items such as bottle tops, jar lids, etc.    Time 8    Period Weeks    Status On-going                 Plan - 02/03/21 1109    Clinical Impression Statement A:  patient with continued reports of increased swelling and discomfort in sling and edema glove. OT issued r size large edema glove turned inside out so that seams were smooth to trial for increased comfort.  Added d ring soft straps to splint and mole skin lining for increased comfort.  Trialed 5 rounds of  scrubbing and carrying task to address pain in her LUE.    Body Structure / Function / Physical Skills ADL;Endurance;UE functional use;Fascial restriction;Pain;ROM;Coordination;Sensation;IADL;Skin integrity;Dexterity;Strength;Edema;Mobility;Tone    Plan P:  Follow up on comfort of splint and edema glove, if splint straps uncomfortable trial soft strap with velcro instead of d ring strap.  continue scrubbing/carrying protocol, fabricate digit extension splint as time allows.           Patient will benefit from skilled therapeutic intervention in order to improve the following deficits and impairments:   Body Structure / Function / Physical Skills: ADL,Endurance,UE functional use,Fascial restriction,Pain,ROM,Coordination,Sensation,IADL,Skin integrity,Dexterity,Strength,Edema,Mobility,Tone       Visit Diagnosis: Pain in left wrist  Pain in left hand  Stiffness of left wrist, not elsewhere classified  Localized edema  Other symptoms and signs involving the musculoskeletal system  Stiffness of left hand, not elsewhere classified    Problem List  Patient Active Problem List   Diagnosis Date Noted  . Subacromial bursitis of right shoulder joint 11/24/2020  . Trochanteric bursitis of right hip 11/24/2020  . Pain of left lower leg 11/15/2020  . Bilateral low back pain with right-sided sciatica 11/15/2020  . Thrush 11/15/2020  . Insomnia 11/19/2018  . Intractable headache 07/18/2018  . Occipital neuralgia 07/18/2018  . Family history of cerebral aneurysm 07/18/2018  . Numbness 04/23/2018  . Cervical radiculopathy 04/23/2018  . Encounter for long-term use of opiate analgesic 04/22/2018  . Lumbar degenerative disc disease 04/22/2018  . Spondylosis of lumbar spine 04/22/2018  . Fibromyalgia affecting multiple sites 04/22/2018  . Constipation due to pain medication 04/30/2017  . Depression 03/22/2017  . Trochanteric bursitis of left hip 03/13/2017  . Neck pain 11/13/2016  . Left  shoulder pain 11/13/2016  . Chronic bilateral low back pain 11/13/2016  . History of colonic polyps 06/20/2016  . IBS (irritable bowel syndrome) 06/20/2016  . Epigastric pain 06/20/2016  . Rhinitis, chronic 04/13/2016  . Otalgia of both ears 04/13/2016  . Arthralgia of right temporomandibular joint 04/13/2016  . BMI 40.0-44.9, adult ([place]) 11/22/2015  . Bilateral carpal tunnel syndrome 11/22/2015  . Tobacco abuse 11/22/2015  . Allergic rhinitis 11/01/2015  . Gastroesophageal reflux disease 11/01/2015  . LLQ pain 06/18/2015  . Bloating 06/18/2015  . Pain with urination 02/19/2015  . Moody 01/22/2015  . Pelvic pain in female 01/13/2015  . Hot flashes due to surgical menopause 01/13/2015  . Pre-diabetes 01/13/2015  . Hypertension 01/13/2015  . Chronic migraine w/o aura, not intractable, w/o stat migr 01/13/2015  . Psoriasis 01/13/2015  . Arthritis 01/13/2015  . Fibromyalgia 01/13/2015  . COPD (chronic obstructive pulmonary disease) ([place]) 01/13/2015  . Hyperlipidemia 01/13/2015  . Abnormal nuclear stress test 01/07/2014  . Abdominal pain 04/16/2013  . Pain in joint involving multiple sites 02/20/2013  . Head trauma 02/20/2013    Vangie Bicker, [place], OTR/L [phone]  02/03/2021, 1:11 PM  [facility] [address] [place], [place], [address] Phone: [phone]   Fax:  [phone]  Name: Judy Evans MRN: [id number] Date of Birth: October 09, 1970

## 2021-02-04 ENCOUNTER — Encounter (HOSPITAL_COMMUNITY): Payer: Self-pay | Admitting: Specialist

## 2021-02-04 ENCOUNTER — Other Ambulatory Visit: Payer: Self-pay

## 2021-02-04 ENCOUNTER — Ambulatory Visit (HOSPITAL_COMMUNITY): Payer: Medicare HMO | Admitting: Specialist

## 2021-02-04 DIAGNOSIS — M25632 Stiffness of left wrist, not elsewhere classified: Secondary | ICD-10-CM | POA: Diagnosis not present

## 2021-02-04 DIAGNOSIS — M25532 Pain in left wrist: Secondary | ICD-10-CM | POA: Diagnosis not present

## 2021-02-04 DIAGNOSIS — M79642 Pain in left hand: Secondary | ICD-10-CM | POA: Diagnosis not present

## 2021-02-04 DIAGNOSIS — M25642 Stiffness of left hand, not elsewhere classified: Secondary | ICD-10-CM | POA: Diagnosis not present

## 2021-02-04 DIAGNOSIS — R600 Localized edema: Secondary | ICD-10-CM

## 2021-02-04 DIAGNOSIS — R29898 Other symptoms and signs involving the musculoskeletal system: Secondary | ICD-10-CM | POA: Diagnosis not present

## 2021-02-06 NOTE — Therapy (Signed)
[facility] [address] [place], [place], [address] Phone: [phone]   Fax:  [phone]  Occupational Therapy Treatment  Patient Details  Name: Judy Evans MRN: [id number] Date of Birth: 08-23-1971 Referring Provider (OT): Dr. Thane Edu   Encounter Date: 02/04/2021   OT End of Session - 02/06/21 0943    Visit Number 9    Number of Visits 16    Date for OT Re-Evaluation 03/13/21    Authorization Type 1) [employer] Medicare, $35 copay 2) Medicaid    Authorization Time Period no visit limit    OT Start Time 1259    OT Stop Time 1345    OT Time Calculation (min) 46 min    Activity Tolerance Patient tolerated treatment well;Patient limited by pain    Behavior During Therapy [facility] for tasks assessed/performed           Past Medical History:  Diagnosis Date  . Anxiety   . Aphasia   . Arthritis   . COPD (chronic obstructive pulmonary disease) ([facility])   . DDD (degenerative disc disease), cervical   . DDD (degenerative disc disease), lumbar   . Degenerative disc disease at L5-S1 level   . Fibromyalgia   . GERD (gastroesophageal reflux disease)   . H. pylori infection   . Hyperlipidemia    States she does not have  . Hypertension    States she does not have  . IBS (irritable bowel syndrome)   . LLQ pain 06/18/2015  . Migraines   . Moody 01/22/2015  . Pain with urination 02/19/2015  . Psoriasis   . Scoliosis   . Spondylolysis     Past Surgical History:  Procedure Laterality Date  . ABDOMINAL HYSTERECTOMY    . BIOPSY  09/14/2016   Procedure: BIOPSY;  Surgeon: Corbin Ade, MD;  [facility];  Service: Endoscopy;;  gastric   . CARDIAC CATHETERIZATION  2015   patient reported it to be normal. "I was dying in pain during procedure".   . CARPAL TUNNEL RELEASE Right   . CESAREAN SECTION    . CHOLECYSTECTOMY    . COLONOSCOPY  06/2014   Vernia Buff DeMason: normal  . DILATION AND CURETTAGE OF UTERUS    .  ESOPHAGOGASTRODUODENOSCOPY  07/2014   Erskine Speed: Normal  . ESOPHAGOGASTRODUODENOSCOPY (EGD) WITH PROPOFOL N/A 09/14/2016   Dr. Jena Gauss: Normal esophagus status post empiric dilation, erosive gastropathy with biopsies showing chronic inactive gastritis, no H pylori.  Marland Kitchen FOOT SURGERY Bilateral    removal of heel spurs  . MALONEY DILATION  09/14/2016   Procedure: Elease Hashimoto DILATION;  Surgeon: Corbin Ade, MD;  [facility];  Service: Endoscopy;;  . rotator cuff surgery Right   . TONSILLECTOMY      There were no vitals filed for this visit.   Subjective Assessment - 02/06/21 0943    Subjective  S:  It seems like when I wear the edema glove, my exercises arent as painful    Currently in Pain? Yes    Pain Score 5     Pain Location Wrist    Pain Orientation Left    Pain Descriptors / Indicators Aching    Pain Type Acute pain              [facility] OT Assessment - 02/06/21 0001      Assessment   Medical Diagnosis s/p left distal radius fracture    Referring Provider (OT) Dr. Thane Edu  Precautions   Precautions Other (comment)    Precaution Comments See [place] Handbook Protocol                    OT Treatments/Exercises (OP) - 02/06/21 0001      Exercises   Exercises Hand;Wrist      Wrist Exercises   Other wrist exercises Completed seated; wrist flexion and extension; AA/ROM with slight pronlonged stretch for 3-5 seconds (provided 2 finger stretch); 10X    Other wrist exercises Completed supine; wrist extension prolonged stretch with arm extended into full shoulder flexion; proivded 2 finger stretch; 3-4 seconds 5X      Hand Exercises   Joint Blocking Exercises passive and then compositie flex and hold 3 times each digit    Other Hand Exercises completed 6 rounds of 1 min scrubbing standing at table and 1' standing with bag with 1/2# weight in bag to her side, swinging gently to simulate walking.  completed to attempt to decrease pain and begin to  functionally use her left arm and hand.    Other Hand Exercises sponges:  5 and 7 with edema glove on      Manual Therapy   Manual Therapy Myofascial release    Manual therapy comments completed separately from therapeutic exercises    Myofascial Release myofascial release and manual stretching to left flexor and extensor forearm, wrist, and hand                  OT Education - 02/06/21 0943    Education Details ok to only wear splint when lifting great than 25#    Methods Explanation    Comprehension Verbalized understanding            OT Short Term Goals - 01/13/21 1745      OT SHORT TERM GOAL #1   Title Pt will be provided with and educated on HEP to improve mobility of left wrist and hand required for daily use.    Time 4    Period Weeks    Status On-going    Target Date 02/11/21      OT SHORT TERM GOAL #2   Title Pt will decrease pain in LUE to 5/10 to improve ability to sleep for 2+ hours without waking due to pain.    Time 4    Period Weeks    Status On-going      OT SHORT TERM GOAL #3   Title Pt will be educated on splint wear and care required for use of daily wrist splint.    Time 1    Period Weeks    Status On-going      OT SHORT TERM GOAL #4   Title Pt will increase forearm, wrist, and digits A/ROM by 15 degrees in all planes to improve ability to use LUE as assist with simple tasks such as donning a shirt or turning a faucet.    Time 4    Period Weeks    Status On-going      OT SHORT TERM GOAL #5   Title Pt will decrease edema in left hand and digits to improve ability to a full fist to increase ability to hold lightweight objects.    Time 4    Period Weeks    Status On-going             OT Long Term Goals - 01/13/21 1745      OT LONG TERM GOAL #1   Title Pt  will decrease pain in LUE to 3/10 or less to improve ability to use LUE as non-dominant during ADLs.    Time 8    Period Weeks    Status On-going      OT LONG TERM GOAL #2    Title Pt will decrease fascial restrictions in LUE to minimal amounts or less to improve mobility required for fixing her hair.    Time 8    Period Weeks    Status On-going      OT LONG TERM GOAL #3   Title Pt will increase left forearm and wrist A/ROM to [facility] to improve ability to reach for items in various planes and grasp with LUE.    Time 8    Period Weeks    Status On-going      OT LONG TERM GOAL #4   Title Pt will increase LUE strength to 4+/5 to improve ability to lift and carry items such as groceries or pots and pans.    Time 8    Period Weeks    Status On-going      OT LONG TERM GOAL #5   Title Pt will increase left grip strength to 30# and pinch strength to 8# to improve ability to grasp and open items such as bottle tops, jar lids, etc.    Time 8    Period Weeks    Status On-going                 Plan - 02/06/21 0944    Clinical Impression Statement A:  able to tolerate increased passive stretching this date.  edema glove with seam on outside is more comfortable for patient.  Per protocol, dc splint except for when lifting 25# or more.  able to complete 6 rounds of scrubbing/carrying task.    Body Structure / Function / Physical Skills ADL;Endurance;UE functional use;Fascial restriction;Pain;ROM;Coordination;Sensation;IADL;Skin integrity;Dexterity;Strength;Edema;Mobility;Tone    Plan P:  increase to 3/4# with carrying.  attempt size medium edema glove.  fabricate digit extension splint for night time use.           Patient will benefit from skilled therapeutic intervention in order to improve the following deficits and impairments:   Body Structure / Function / Physical Skills: ADL,Endurance,UE functional use,Fascial restriction,Pain,ROM,Coordination,Sensation,IADL,Skin integrity,Dexterity,Strength,Edema,Mobility,Tone       Visit Diagnosis: Pain in left wrist  Pain in left hand  Stiffness of left wrist, not elsewhere classified  Localized  edema    Problem List Patient Active Problem List   Diagnosis Date Noted  . Subacromial bursitis of right shoulder joint 11/24/2020  . Trochanteric bursitis of right hip 11/24/2020  . Pain of left lower leg 11/15/2020  . Bilateral low back pain with right-sided sciatica 11/15/2020  . Thrush 11/15/2020  . Insomnia 11/19/2018  . Intractable headache 07/18/2018  . Occipital neuralgia 07/18/2018  . Family history of cerebral aneurysm 07/18/2018  . Numbness 04/23/2018  . Cervical radiculopathy 04/23/2018  . Encounter for long-term use of opiate analgesic 04/22/2018  . Lumbar degenerative disc disease 04/22/2018  . Spondylosis of lumbar spine 04/22/2018  . Fibromyalgia affecting multiple sites 04/22/2018  . Constipation due to pain medication 04/30/2017  . Depression 03/22/2017  . Trochanteric bursitis of left hip 03/13/2017  . Neck pain 11/13/2016  . Left shoulder pain 11/13/2016  . Chronic bilateral low back pain 11/13/2016  . History of colonic polyps 06/20/2016  . IBS (irritable bowel syndrome) 06/20/2016  . Epigastric pain 06/20/2016  . Rhinitis, chronic 04/13/2016  .  Otalgia of both ears 04/13/2016  . Arthralgia of right temporomandibular joint 04/13/2016  . BMI 40.0-44.9, adult ([place]) 11/22/2015  . Bilateral carpal tunnel syndrome 11/22/2015  . Tobacco abuse 11/22/2015  . Allergic rhinitis 11/01/2015  . Gastroesophageal reflux disease 11/01/2015  . LLQ pain 06/18/2015  . Bloating 06/18/2015  . Pain with urination 02/19/2015  . Moody 01/22/2015  . Pelvic pain in female 01/13/2015  . Hot flashes due to surgical menopause 01/13/2015  . Pre-diabetes 01/13/2015  . Hypertension 01/13/2015  . Chronic migraine w/o aura, not intractable, w/o stat migr 01/13/2015  . Psoriasis 01/13/2015  . Arthritis 01/13/2015  . Fibromyalgia 01/13/2015  . COPD (chronic obstructive pulmonary disease) ([place]) 01/13/2015  . Hyperlipidemia 01/13/2015  . Abnormal nuclear stress test 01/07/2014   . Abdominal pain 04/16/2013  . Pain in joint involving multiple sites 02/20/2013  . Head trauma 02/20/2013    Vangie Bicker, [place], OTR/L [phone]  02/06/2021, 9:49 AM  [facility] [address] [place], [place], [address] Phone: [phone]   Fax:  [phone]  Name: Judy Evans MRN: [id number] Date of Birth: 1971/01/04

## 2021-02-08 ENCOUNTER — Encounter (HOSPITAL_COMMUNITY): Payer: Self-pay | Admitting: Occupational Therapy

## 2021-02-08 ENCOUNTER — Other Ambulatory Visit: Payer: Self-pay

## 2021-02-08 ENCOUNTER — Telehealth: Payer: Self-pay | Admitting: Orthopaedic Surgery

## 2021-02-08 ENCOUNTER — Ambulatory Visit (HOSPITAL_COMMUNITY): Payer: Medicare HMO | Admitting: Occupational Therapy

## 2021-02-08 DIAGNOSIS — M25632 Stiffness of left wrist, not elsewhere classified: Secondary | ICD-10-CM

## 2021-02-08 DIAGNOSIS — R29898 Other symptoms and signs involving the musculoskeletal system: Secondary | ICD-10-CM

## 2021-02-08 DIAGNOSIS — M25642 Stiffness of left hand, not elsewhere classified: Secondary | ICD-10-CM | POA: Diagnosis not present

## 2021-02-08 DIAGNOSIS — R600 Localized edema: Secondary | ICD-10-CM | POA: Diagnosis not present

## 2021-02-08 DIAGNOSIS — M79642 Pain in left hand: Secondary | ICD-10-CM

## 2021-02-08 DIAGNOSIS — M25532 Pain in left wrist: Secondary | ICD-10-CM

## 2021-02-08 NOTE — Therapy (Signed)
[place], [place], [address] Phone: [phone]   Fax:  [phone]  Occupational Therapy Treatment  Patient Details  Name: KALEIYAH POLSKY MRN: [id number] Date of Birth: 26-Aug-1971 Referring Provider (OT): Dr. Larena Glassman   Progress Note Reporting Period 01/12/2021 to 02/08/2021  See note below for Objective Data and Assessment of Progress/Goals.       Encounter Date: 02/08/2021   OT End of Session - 02/08/21 1654    Visit Number 10    Number of Visits 16    Date for OT Re-Evaluation 03/13/21    Authorization Type 1) Aetna Medicare, $35 copay 2) Medicaid    Authorization Time Period no visit limit    Progress Note Due on Visit 20    OT Start Time 1602    OT Stop Time 1645    OT Time Calculation (min) 43 min    Activity Tolerance Patient tolerated treatment well;Patient limited by pain    Behavior During Therapy WFL for tasks assessed/performed           Past Medical History:  Diagnosis Date  . Anxiety   . Aphasia   . Arthritis   . COPD (chronic obstructive pulmonary disease) ([place])   . DDD (degenerative disc disease), cervical   . DDD (degenerative disc disease), lumbar   . Degenerative disc disease at L5-S1 level   . Fibromyalgia   . GERD (gastroesophageal reflux disease)   . H. pylori infection   . Hyperlipidemia    States she does not have  . Hypertension    States she does not have  . IBS (irritable bowel syndrome)   . LLQ pain 06/18/2015  . Migraines   . Moody 01/22/2015  . Pain with urination 02/19/2015  . Psoriasis   . Scoliosis   . Spondylolysis     Past Surgical History:  Procedure Laterality Date  . ABDOMINAL HYSTERECTOMY    . BIOPSY  09/14/2016   Procedure: BIOPSY;  Surgeon: Daneil Dolin, MD;  [facility];  Service: Endoscopy;;  gastric   . CARDIAC CATHETERIZATION  2015   patient reported it to be normal. "I was dying in pain during procedure".   . CARPAL  TUNNEL RELEASE Right   . CESAREAN SECTION    . CHOLECYSTECTOMY    . COLONOSCOPY  06/2014   Altamese Dilling DeMason: normal  . DILATION AND CURETTAGE OF UTERUS    . ESOPHAGOGASTRODUODENOSCOPY  07/2014   Burke Keels: Normal  . ESOPHAGOGASTRODUODENOSCOPY (EGD) WITH PROPOFOL N/A 09/14/2016   Dr. Gala Romney: Normal esophagus status post empiric dilation, erosive gastropathy with biopsies showing chronic inactive gastritis, no H pylori.  Marland Kitchen FOOT SURGERY Bilateral    removal of heel spurs  . MALONEY DILATION  09/14/2016   Procedure: Venia Minks DILATION;  Surgeon: Daneil Dolin, MD;  [facility];  Service: Endoscopy;;  . rotator cuff surgery Right   . TONSILLECTOMY      There were no vitals filed for this visit.   Subjective Assessment - 02/08/21 1601    Subjective  S: I have an appointment for next Wednesday with the hand surgeon.    Currently in Pain? Yes    Pain Score 3     Pain Location Hand    Pain Orientation Left    Pain Descriptors / Indicators Aching;Sore    Pain Type Acute pain    Pain Radiating Towards towards elbow and shoulder    Pain Onset More  than a month ago    Pain Frequency Constant    Aggravating Factors  movement    Pain Relieving Factors rest, soaking in warm water, ice    Effect of Pain on Daily Activities unable to use LUE for ADLs    Multiple Pain Sites No              [facility] OT Assessment - 02/08/21 1601      Assessment   Medical Diagnosis s/p left distal radius fracture      Precautions   Precautions Other (comment)    Precaution Comments See [place] Handbook Protocol                    OT Treatments/Exercises (OP) - 02/08/21 1606      Exercises   Exercises Hand;Wrist      Wrist Exercises   Wrist Flexion PROM;AROM;10 reps    Wrist Extension PROM;AROM;10 reps    Other wrist exercises supination/pronation, A/ROM, 10X      Hand Exercises   Joint Blocking Exercises passive and then compositie flex and hold 3 times each digit    Other  Hand Exercises completed 2 rounds of 2' scrubbing standing at table and 2' standing with bag with 1# weight  swinging gently to simulate walking.  completed to attempt to decrease pain and begin to functionally use her left arm and hand.      Manual Therapy   Manual Therapy Myofascial release    Manual therapy comments completed separately from therapeutic exercises    Myofascial Release myofascial release and manual stretching to left flexor and extensor forearm, wrist, and hand                    OT Short Term Goals - 01/13/21 1745      OT SHORT TERM GOAL #1   Title Pt will be provided with and educated on HEP to improve mobility of left wrist and hand required for daily use.    Time 4    Period Weeks    Status On-going    Target Date 02/11/21      OT SHORT TERM GOAL #2   Title Pt will decrease pain in LUE to 5/10 to improve ability to sleep for 2+ hours without waking due to pain.    Time 4    Period Weeks    Status On-going      OT SHORT TERM GOAL #3   Title Pt will be educated on splint wear and care required for use of daily wrist splint.    Time 1    Period Weeks    Status On-going      OT SHORT TERM GOAL #4   Title Pt will increase forearm, wrist, and digits A/ROM by 15 degrees in all planes to improve ability to use LUE as assist with simple tasks such as donning a shirt or turning a faucet.    Time 4    Period Weeks    Status On-going      OT SHORT TERM GOAL #5   Title Pt will decrease edema in left hand and digits to improve ability to a full fist to increase ability to hold lightweight objects.    Time 4    Period Weeks    Status On-going             OT Long Term Goals - 01/13/21 1745      OT LONG TERM GOAL #1  Title Pt will decrease pain in LUE to 3/10 or less to improve ability to use LUE as non-dominant during ADLs.    Time 8    Period Weeks    Status On-going      OT LONG TERM GOAL #2   Title Pt will decrease fascial restrictions in  LUE to minimal amounts or less to improve mobility required for fixing her hair.    Time 8    Period Weeks    Status On-going      OT LONG TERM GOAL #3   Title Pt will increase left forearm and wrist A/ROM to [facility] to improve ability to reach for items in various planes and grasp with LUE.    Time 8    Period Weeks    Status On-going      OT LONG TERM GOAL #4   Title Pt will increase LUE strength to 4+/5 to improve ability to lift and carry items such as groceries or pots and pans.    Time 8    Period Weeks    Status On-going      OT LONG TERM GOAL #5   Title Pt will increase left grip strength to 30# and pinch strength to 8# to improve ability to grasp and open items such as bottle tops, jar lids, etc.    Time 8    Period Weeks    Status On-going                 Plan - 02/08/21 1654    Clinical Impression Statement A: Continued with myofascial release and manual techniques to address fascial restrictions and stiffness. Pt reports severe pain the day after her therapy, has not been completing specific scrub and carry protocol but has been trying to simulate scrub motion during daily tasks. Increased scrub and carry time to 2' rounds, however only able to complete one round due to time constraint. Provided medium sized edema glove today, pt able to tolerate well. Verbal cuing for form and technique during tasks. Pt reports she is using her left hand to hold washcloth and bathe, and is attempting to incorporate into daily tasks. Pt now has functional extension, however has ~50% flexion of digits.    Body Structure / Function / Physical Skills ADL;Endurance;UE functional use;Fascial restriction;Pain;ROM;Coordination;Sensation;IADL;Skin integrity;Dexterity;Strength;Edema;Mobility;Tone    Plan P: follow up on medium edema glove, trial mirror therapy technique for improved motion and decreased pain with hand mobility    OT Home Exercise Plan eval: retrograde massage, elevation techniques;  4/21: educated on digit/hand A/ROM to begin completing; splint wear and care; continue retrograde massage. 4/28: continue with hand A/ROM, add elbow and wrist A/ROM 5/3: wrist and elbow stretches (verbal education)    Consulted and Agree with Plan of Care Patient           Patient will benefit from skilled therapeutic intervention in order to improve the following deficits and impairments:   Body Structure / Function / Physical Skills: ADL,Endurance,UE functional use,Fascial restriction,Pain,ROM,Coordination,Sensation,IADL,Skin integrity,Dexterity,Strength,Edema,Mobility,Tone       Visit Diagnosis: Pain in left wrist  Pain in left hand  Stiffness of left wrist, not elsewhere classified  Localized edema  Other symptoms and signs involving the musculoskeletal system  Stiffness of left hand, not elsewhere classified    Problem List Patient Active Problem List   Diagnosis Date Noted  . Subacromial bursitis of right shoulder joint 11/24/2020  . Trochanteric bursitis of right hip 11/24/2020  . Pain of left lower leg  11/15/2020  . Bilateral low back pain with right-sided sciatica 11/15/2020  . Thrush 11/15/2020  . Insomnia 11/19/2018  . Intractable headache 07/18/2018  . Occipital neuralgia 07/18/2018  . Family history of cerebral aneurysm 07/18/2018  . Numbness 04/23/2018  . Cervical radiculopathy 04/23/2018  . Encounter for long-term use of opiate analgesic 04/22/2018  . Lumbar degenerative disc disease 04/22/2018  . Spondylosis of lumbar spine 04/22/2018  . Fibromyalgia affecting multiple sites 04/22/2018  . Constipation due to pain medication 04/30/2017  . Depression 03/22/2017  . Trochanteric bursitis of left hip 03/13/2017  . Neck pain 11/13/2016  . Left shoulder pain 11/13/2016  . Chronic bilateral low back pain 11/13/2016  . History of colonic polyps 06/20/2016  . IBS (irritable bowel syndrome) 06/20/2016  . Epigastric pain 06/20/2016  . Rhinitis, chronic  04/13/2016  . Otalgia of both ears 04/13/2016  . Arthralgia of right temporomandibular joint 04/13/2016  . BMI 40.0-44.9, adult ([place]) 11/22/2015  . Bilateral carpal tunnel syndrome 11/22/2015  . Tobacco abuse 11/22/2015  . Allergic rhinitis 11/01/2015  . Gastroesophageal reflux disease 11/01/2015  . LLQ pain 06/18/2015  . Bloating 06/18/2015  . Pain with urination 02/19/2015  . Moody 01/22/2015  . Pelvic pain in female 01/13/2015  . Hot flashes due to surgical menopause 01/13/2015  . Pre-diabetes 01/13/2015  . Hypertension 01/13/2015  . Chronic migraine w/o aura, not intractable, w/o stat migr 01/13/2015  . Psoriasis 01/13/2015  . Arthritis 01/13/2015  . Fibromyalgia 01/13/2015  . COPD (chronic obstructive pulmonary disease) ([place]) 01/13/2015  . Hyperlipidemia 01/13/2015  . Abnormal nuclear stress test 01/07/2014  . Abdominal pain 04/16/2013  . Pain in joint involving multiple sites 02/20/2013  . Head trauma 02/20/2013   Guadelupe Sabin, OTR/L  [phone] 02/08/2021, 5:35 PM  [place] [address] [place], [place], [address] Phone: [phone]   Fax:  [phone]  Name: BLAKELYN DINGES MRN: [id number] Date of Birth: Mar 23, 1971

## 2021-02-08 NOTE — Telephone Encounter (Signed)
Patient called this morning stating she was referred to a hand surgeon last week when she was in the office but has not heard anything back from [facility].  She wanted to know if she could call them.  I told her that she could and gave her the phone number.  She came by the office this afternoon stating she had called and called them but could not get through.  She said she couldn't even get a voicemail.    I called [facility] for her and I got someone at the facility.  I gave the phone to Ms. Delvecchio and she scheduled an appointment for next Wednesday at 11:00.    Apparently they had tried to call her but her voicemail was full.

## 2021-02-09 ENCOUNTER — Ambulatory Visit (HOSPITAL_COMMUNITY): Payer: Medicare HMO | Admitting: Occupational Therapy

## 2021-02-11 ENCOUNTER — Other Ambulatory Visit: Payer: Self-pay | Admitting: *Deleted

## 2021-02-11 ENCOUNTER — Ambulatory Visit (HOSPITAL_COMMUNITY): Payer: Medicare HMO | Admitting: Occupational Therapy

## 2021-02-11 ENCOUNTER — Encounter (HOSPITAL_COMMUNITY): Payer: Self-pay | Admitting: Occupational Therapy

## 2021-02-11 ENCOUNTER — Other Ambulatory Visit: Payer: Self-pay

## 2021-02-11 DIAGNOSIS — R600 Localized edema: Secondary | ICD-10-CM | POA: Diagnosis not present

## 2021-02-11 DIAGNOSIS — M25532 Pain in left wrist: Secondary | ICD-10-CM | POA: Diagnosis not present

## 2021-02-11 DIAGNOSIS — M25642 Stiffness of left hand, not elsewhere classified: Secondary | ICD-10-CM | POA: Diagnosis not present

## 2021-02-11 DIAGNOSIS — M79642 Pain in left hand: Secondary | ICD-10-CM

## 2021-02-11 DIAGNOSIS — R29898 Other symptoms and signs involving the musculoskeletal system: Secondary | ICD-10-CM | POA: Diagnosis not present

## 2021-02-11 DIAGNOSIS — M25632 Stiffness of left wrist, not elsewhere classified: Secondary | ICD-10-CM | POA: Diagnosis not present

## 2021-02-11 DIAGNOSIS — K219 Gastro-esophageal reflux disease without esophagitis: Secondary | ICD-10-CM

## 2021-02-11 MED ORDER — OMEPRAZOLE 40 MG PO CPDR: DELAYED_RELEASE_CAPSULE | ORAL | 0 refills | Status: DC

## 2021-02-11 NOTE — Therapy (Signed)
[place], [place], [address] Phone: [phone]   Fax:  [phone]  Occupational Therapy Treatment  Patient Details  Name: Judy Evans MRN: [id number] Date of Birth: 06-16-1971 Referring Provider (OT): Dr. Larena Glassman   Encounter Date: 02/11/2021   OT End of Session - 02/11/21 1301    Visit Number 11    Number of Visits 16    Date for OT Re-Evaluation 03/13/21    Authorization Type 1) Aetna Medicare, $35 copay 2) Medicaid    Authorization Time Period no visit limit    Progress Note Due on Visit 20    OT Start Time 1118    OT Stop Time 1157    OT Time Calculation (min) 39 min    Activity Tolerance Patient tolerated treatment well;Patient limited by pain    Behavior During Therapy [facility] for tasks assessed/performed           Past Medical History:  Diagnosis Date  . Anxiety   . Aphasia   . Arthritis   . COPD (chronic obstructive pulmonary disease) ([place])   . DDD (degenerative disc disease), cervical   . DDD (degenerative disc disease), lumbar   . Degenerative disc disease at L5-S1 level   . Fibromyalgia   . GERD (gastroesophageal reflux disease)   . H. pylori infection   . Hyperlipidemia    States she does not have  . Hypertension    States she does not have  . IBS (irritable bowel syndrome)   . LLQ pain 06/18/2015  . Migraines   . Moody 01/22/2015  . Pain with urination 02/19/2015  . Psoriasis   . Scoliosis   . Spondylolysis     Past Surgical History:  Procedure Laterality Date  . ABDOMINAL HYSTERECTOMY    . BIOPSY  09/14/2016   Procedure: BIOPSY;  Surgeon: Daneil Dolin, MD;  [facility];  Service: Endoscopy;;  gastric   . CARDIAC CATHETERIZATION  2015   patient reported it to be normal. "I was dying in pain during procedure".   . CARPAL TUNNEL RELEASE Right   . CESAREAN SECTION    . CHOLECYSTECTOMY    . COLONOSCOPY  06/2014   Altamese Dilling DeMason: normal  . DILATION AND  CURETTAGE OF UTERUS    . ESOPHAGOGASTRODUODENOSCOPY  07/2014   Burke Keels: Normal  . ESOPHAGOGASTRODUODENOSCOPY (EGD) WITH PROPOFOL N/A 09/14/2016   Dr. Gala Romney: Normal esophagus status post empiric dilation, erosive gastropathy with biopsies showing chronic inactive gastritis, no H pylori.  Marland Kitchen FOOT SURGERY Bilateral    removal of heel spurs  . MALONEY DILATION  09/14/2016   Procedure: Venia Minks DILATION;  Surgeon: Daneil Dolin, MD;  [facility];  Service: Endoscopy;;  . rotator cuff surgery Right   . TONSILLECTOMY      There were no vitals filed for this visit.   Subjective Assessment - 02/11/21 1120    Subjective  S: I was in a lot of pain after last session.    Currently in Pain? Yes    Pain Score 3     Pain Location Wrist    Pain Orientation Left    Pain Descriptors / Indicators Aching;Sore    Pain Type Acute pain    Pain Radiating Towards towards elbow and shoulder    Pain Onset More than a month ago    Pain Frequency Constant    Aggravating Factors  movement    Pain Relieving Factors rest, soaking  in warm water, ice    Effect of Pain on Daily Activities unable to use LUE for ADLs    Multiple Pain Sites No              [facility] OT Assessment - 02/11/21 1120      Assessment   Medical Diagnosis s/p left distal radius fracture      Precautions   Precautions Other (comment)    Precaution Comments See [place] Handbook Protocol                    OT Treatments/Exercises (OP) - 02/11/21 1121      Exercises   Exercises Hand;Wrist;Elbow      Wrist Exercises   Wrist Flexion AROM;10 reps   using mirror   Wrist Extension AROM;10 reps   using mirror   Other wrist exercises Using mirror, supination/pronation, A/ROM, 10X      Hand Exercises   Digit Composite ABduction AROM;10 reps   using mirror   Digit Composite ADduction AROM;10 reps   using mirror   Thumb Opposition Using mirror, 10X, 2nd-5th digit    Other Hand Exercises Using mirror, composite  digit flexion/extension, 10X, A/ROM    Other Hand Exercises Using mirror, finger taps, 10X each      Manual Therapy   Manual Therapy Myofascial release    Manual therapy comments completed separately from therapeutic exercises    Myofascial Release myofascial release and manual stretching to left flexor and extensor forearm, wrist, and hand                    OT Short Term Goals - 01/13/21 1745      OT SHORT TERM GOAL #1   Title Pt will be provided with and educated on HEP to improve mobility of left wrist and hand required for daily use.    Time 4    Period Weeks    Status On-going    Target Date 02/11/21      OT SHORT TERM GOAL #2   Title Pt will decrease pain in LUE to 5/10 to improve ability to sleep for 2+ hours without waking due to pain.    Time 4    Period Weeks    Status On-going      OT SHORT TERM GOAL #3   Title Pt will be educated on splint wear and care required for use of daily wrist splint.    Time 1    Period Weeks    Status On-going      OT SHORT TERM GOAL #4   Title Pt will increase forearm, wrist, and digits A/ROM by 15 degrees in all planes to improve ability to use LUE as assist with simple tasks such as donning a shirt or turning a faucet.    Time 4    Period Weeks    Status On-going      OT SHORT TERM GOAL #5   Title Pt will decrease edema in left hand and digits to improve ability to a full fist to increase ability to hold lightweight objects.    Time 4    Period Weeks    Status On-going             OT Long Term Goals - 01/13/21 1745      OT LONG TERM GOAL #1   Title Pt will decrease pain in LUE to 3/10 or less to improve ability to use LUE as non-dominant during ADLs.  Time 8    Period Weeks    Status On-going      OT LONG TERM GOAL #2   Title Pt will decrease fascial restrictions in LUE to minimal amounts or less to improve mobility required for fixing her hair.    Time 8    Period Weeks    Status On-going      OT  LONG TERM GOAL #3   Title Pt will increase left forearm and wrist A/ROM to [facility] to improve ability to reach for items in various planes and grasp with LUE.    Time 8    Period Weeks    Status On-going      OT LONG TERM GOAL #4   Title Pt will increase LUE strength to 4+/5 to improve ability to lift and carry items such as groceries or pots and pans.    Time 8    Period Weeks    Status On-going      OT LONG TERM GOAL #5   Title Pt will increase left grip strength to 30# and pinch strength to 8# to improve ability to grasp and open items such as bottle tops, jar lids, etc.    Time 8    Period Weeks    Status On-going                 Plan - 02/11/21 1148    Clinical Impression Statement A: Pt reports high pain after last session, reports being depressed and wanting to sleep due to pain. Discussed talking to MD about referral to counselor, pt with hx of depression and does not want to go back on any medication. Continued with manual techniques for edema, fascial restrictions, and pain. Introduced Geologist, engineering therapy technique with great results. Pt able to complete all exercises and gain increased ROM with less pain. Discussed potential use of flexion glove and/or PIP extension splint.    Body Structure / Function / Physical Skills ADL;Endurance;UE functional use;Fascial restriction;Pain;ROM;Coordination;Sensation;IADL;Skin integrity;Dexterity;Strength;Edema;Mobility;Tone    Plan P: Continue with mirror therapy technique, trial pinch task with yellow clothespin while inside mirror box.    OT Home Exercise Plan eval: retrograde massage, elevation techniques; 4/21: educated on digit/hand A/ROM to begin completing; splint wear and care; continue retrograde massage. 4/28: continue with hand A/ROM, add elbow and wrist A/ROM 5/3: wrist and elbow stretches (verbal education)           Patient will benefit from skilled therapeutic intervention in order to improve the following deficits and  impairments:   Body Structure / Function / Physical Skills: ADL,Endurance,UE functional use,Fascial restriction,Pain,ROM,Coordination,Sensation,IADL,Skin integrity,Dexterity,Strength,Edema,Mobility,Tone       Visit Diagnosis: Pain in left wrist  Pain in left hand  Stiffness of left wrist, not elsewhere classified  Localized edema  Other symptoms and signs involving the musculoskeletal system  Stiffness of left hand, not elsewhere classified    Problem List Patient Active Problem List   Diagnosis Date Noted  . Subacromial bursitis of right shoulder joint 11/24/2020  . Trochanteric bursitis of right hip 11/24/2020  . Pain of left lower leg 11/15/2020  . Bilateral low back pain with right-sided sciatica 11/15/2020  . Thrush 11/15/2020  . Insomnia 11/19/2018  . Intractable headache 07/18/2018  . Occipital neuralgia 07/18/2018  . Family history of cerebral aneurysm 07/18/2018  . Numbness 04/23/2018  . Cervical radiculopathy 04/23/2018  . Encounter for long-term use of opiate analgesic 04/22/2018  . Lumbar degenerative disc disease 04/22/2018  . Spondylosis of lumbar spine 04/22/2018  .  Fibromyalgia affecting multiple sites 04/22/2018  . Constipation due to pain medication 04/30/2017  . Depression 03/22/2017  . Trochanteric bursitis of left hip 03/13/2017  . Neck pain 11/13/2016  . Left shoulder pain 11/13/2016  . Chronic bilateral low back pain 11/13/2016  . History of colonic polyps 06/20/2016  . IBS (irritable bowel syndrome) 06/20/2016  . Epigastric pain 06/20/2016  . Rhinitis, chronic 04/13/2016  . Otalgia of both ears 04/13/2016  . Arthralgia of right temporomandibular joint 04/13/2016  . BMI 40.0-44.9, adult ([place]) 11/22/2015  . Bilateral carpal tunnel syndrome 11/22/2015  . Tobacco abuse 11/22/2015  . Allergic rhinitis 11/01/2015  . Gastroesophageal reflux disease 11/01/2015  . LLQ pain 06/18/2015  . Bloating 06/18/2015  . Pain with urination 02/19/2015  .  Moody 01/22/2015  . Pelvic pain in female 01/13/2015  . Hot flashes due to surgical menopause 01/13/2015  . Pre-diabetes 01/13/2015  . Hypertension 01/13/2015  . Chronic migraine w/o aura, not intractable, w/o stat migr 01/13/2015  . Psoriasis 01/13/2015  . Arthritis 01/13/2015  . Fibromyalgia 01/13/2015  . COPD (chronic obstructive pulmonary disease) ([place]) 01/13/2015  . Hyperlipidemia 01/13/2015  . Abnormal nuclear stress test 01/07/2014  . Abdominal pain 04/16/2013  . Pain in joint involving multiple sites 02/20/2013  . Head trauma 02/20/2013   Guadelupe Sabin, OTR/L  [phone] 02/11/2021, 1:01 PM  [facility] [address] [place], [place], [address] Phone: [phone]   Fax:  [phone]  Name: Judy Evans MRN: [id number] Date of Birth: [birth year]

## 2021-02-15 ENCOUNTER — Ambulatory Visit (HOSPITAL_COMMUNITY): Payer: Medicare HMO | Admitting: Occupational Therapy

## 2021-02-16 ENCOUNTER — Telehealth (HOSPITAL_COMMUNITY): Payer: Self-pay | Admitting: Occupational Therapy

## 2021-02-16 ENCOUNTER — Ambulatory Visit (HOSPITAL_COMMUNITY): Payer: Medicare HMO

## 2021-02-16 ENCOUNTER — Encounter (HOSPITAL_COMMUNITY): Payer: Self-pay | Admitting: Occupational Therapy

## 2021-02-16 DIAGNOSIS — S52572A Other intraarticular fracture of lower end of left radius, initial encounter for closed fracture: Secondary | ICD-10-CM | POA: Diagnosis not present

## 2021-02-16 DIAGNOSIS — G90512 Complex regional pain syndrome I of left upper limb: Secondary | ICD-10-CM | POA: Diagnosis not present

## 2021-02-16 DIAGNOSIS — M65331 Trigger finger, right middle finger: Secondary | ICD-10-CM | POA: Diagnosis not present

## 2021-02-16 DIAGNOSIS — Z8781 Personal history of (healed) traumatic fracture: Secondary | ICD-10-CM | POA: Diagnosis not present

## 2021-02-16 DIAGNOSIS — M25642 Stiffness of left hand, not elsewhere classified: Secondary | ICD-10-CM | POA: Diagnosis not present

## 2021-02-16 NOTE — Telephone Encounter (Signed)
Pt requested to be d/c

## 2021-02-16 NOTE — Therapy (Signed)
[place], [place], [address] Phone: [phone]   Fax:  [phone]  Patient Details  Name: Judy Evans MRN: [id number] Date of Birth: 05-11-1971 Referring Provider:  No ref. provider found  Encounter Date: 02/16/2021   OCCUPATIONAL THERAPY DISCHARGE SUMMARY  Visits from Start of Care: 11  Current functional level related to goals / functional outcomes: Pt with limited functional use of LUE due to suspected CRPS s/p distal radius fx. Pt with wrist, hand, and digit stiffness, splinting completed at initial visit per MD request, splint has since been discontinued. Pt has shown improvement in edema and hand mobility, is primarily limited by high pain levels. Introduced Geologist, engineering therapy at last OT session with great results. Pt able to complete all exercises and maintain her pain at a 3/10 or less.   Pt called today to request discharge as she saw the hand specialist today and he wants her to complete therapy at his office.    Remaining deficits: Continued stiffness, decreased ROM and strength, dexterity of left hand   Education / Equipment: HEP  Plan: Patient agrees to discharge.  Patient goals were not met. Patient is being discharged due to the patient's request.  ?????      Guadelupe Sabin, OTR/L  [phone] 02/16/2021, 1:36 PM  [facility] [address] [place], [place], [address] Phone: [phone]   Fax:  [phone]

## 2021-02-17 ENCOUNTER — Encounter (HOSPITAL_COMMUNITY): Payer: Self-pay | Admitting: Occupational Therapy

## 2021-02-17 DIAGNOSIS — M25642 Stiffness of left hand, not elsewhere classified: Secondary | ICD-10-CM | POA: Diagnosis not present

## 2021-02-17 DIAGNOSIS — S52502D Unspecified fracture of the lower end of left radius, subsequent encounter for closed fracture with routine healing: Secondary | ICD-10-CM | POA: Diagnosis not present

## 2021-02-17 DIAGNOSIS — G90512 Complex regional pain syndrome I of left upper limb: Secondary | ICD-10-CM | POA: Diagnosis not present

## 2021-02-19 DIAGNOSIS — J449 Chronic obstructive pulmonary disease, unspecified: Secondary | ICD-10-CM | POA: Diagnosis not present

## 2021-02-22 ENCOUNTER — Ambulatory Visit (HOSPITAL_COMMUNITY): Payer: Medicare HMO | Admitting: Occupational Therapy

## 2021-02-23 ENCOUNTER — Encounter (HOSPITAL_COMMUNITY): Payer: Self-pay | Admitting: Occupational Therapy

## 2021-02-23 DIAGNOSIS — S52502D Unspecified fracture of the lower end of left radius, subsequent encounter for closed fracture with routine healing: Secondary | ICD-10-CM | POA: Diagnosis not present

## 2021-02-23 DIAGNOSIS — M25642 Stiffness of left hand, not elsewhere classified: Secondary | ICD-10-CM | POA: Diagnosis not present

## 2021-02-23 DIAGNOSIS — G90512 Complex regional pain syndrome I of left upper limb: Secondary | ICD-10-CM | POA: Diagnosis not present

## 2021-02-24 ENCOUNTER — Encounter (HOSPITAL_COMMUNITY): Payer: Medicare HMO | Admitting: Occupational Therapy

## 2021-02-25 DIAGNOSIS — M25642 Stiffness of left hand, not elsewhere classified: Secondary | ICD-10-CM | POA: Diagnosis not present

## 2021-02-25 DIAGNOSIS — G90512 Complex regional pain syndrome I of left upper limb: Secondary | ICD-10-CM | POA: Diagnosis not present

## 2021-02-25 DIAGNOSIS — S52502D Unspecified fracture of the lower end of left radius, subsequent encounter for closed fracture with routine healing: Secondary | ICD-10-CM | POA: Diagnosis not present

## 2021-02-28 ENCOUNTER — Encounter (HOSPITAL_COMMUNITY): Payer: Self-pay

## 2021-03-01 ENCOUNTER — Encounter (HOSPITAL_COMMUNITY): Payer: Medicare HMO | Admitting: Occupational Therapy

## 2021-03-02 DIAGNOSIS — S52502D Unspecified fracture of the lower end of left radius, subsequent encounter for closed fracture with routine healing: Secondary | ICD-10-CM | POA: Diagnosis not present

## 2021-03-02 DIAGNOSIS — M25642 Stiffness of left hand, not elsewhere classified: Secondary | ICD-10-CM | POA: Diagnosis not present

## 2021-03-02 DIAGNOSIS — G90512 Complex regional pain syndrome I of left upper limb: Secondary | ICD-10-CM | POA: Diagnosis not present

## 2021-03-02 DIAGNOSIS — M79602 Pain in left arm: Secondary | ICD-10-CM | POA: Diagnosis not present

## 2021-03-03 ENCOUNTER — Encounter (HOSPITAL_COMMUNITY): Payer: Medicare HMO | Admitting: Occupational Therapy

## 2021-03-04 DIAGNOSIS — Z8739 Personal history of other diseases of the musculoskeletal system and connective tissue: Secondary | ICD-10-CM | POA: Diagnosis not present

## 2021-03-04 DIAGNOSIS — M79602 Pain in left arm: Secondary | ICD-10-CM | POA: Diagnosis not present

## 2021-03-04 DIAGNOSIS — M25571 Pain in right ankle and joints of right foot: Secondary | ICD-10-CM | POA: Diagnosis not present

## 2021-03-04 DIAGNOSIS — M47816 Spondylosis without myelopathy or radiculopathy, lumbar region: Secondary | ICD-10-CM | POA: Diagnosis not present

## 2021-03-04 DIAGNOSIS — G90512 Complex regional pain syndrome I of left upper limb: Secondary | ICD-10-CM | POA: Insufficient documentation

## 2021-03-04 DIAGNOSIS — M25642 Stiffness of left hand, not elsewhere classified: Secondary | ICD-10-CM | POA: Diagnosis not present

## 2021-03-04 DIAGNOSIS — S52502D Unspecified fracture of the lower end of left radius, subsequent encounter for closed fracture with routine healing: Secondary | ICD-10-CM | POA: Diagnosis not present

## 2021-03-04 DIAGNOSIS — M5030 Other cervical disc degeneration, unspecified cervical region: Secondary | ICD-10-CM | POA: Diagnosis not present

## 2021-03-07 ENCOUNTER — Encounter (HOSPITAL_COMMUNITY): Payer: Self-pay

## 2021-03-09 DIAGNOSIS — S52502D Unspecified fracture of the lower end of left radius, subsequent encounter for closed fracture with routine healing: Secondary | ICD-10-CM | POA: Diagnosis not present

## 2021-03-09 DIAGNOSIS — M79602 Pain in left arm: Secondary | ICD-10-CM | POA: Diagnosis not present

## 2021-03-09 DIAGNOSIS — M25642 Stiffness of left hand, not elsewhere classified: Secondary | ICD-10-CM | POA: Diagnosis not present

## 2021-03-09 DIAGNOSIS — G90512 Complex regional pain syndrome I of left upper limb: Secondary | ICD-10-CM | POA: Diagnosis not present

## 2021-03-11 DIAGNOSIS — M79602 Pain in left arm: Secondary | ICD-10-CM | POA: Diagnosis not present

## 2021-03-11 DIAGNOSIS — G90512 Complex regional pain syndrome I of left upper limb: Secondary | ICD-10-CM | POA: Diagnosis not present

## 2021-03-11 DIAGNOSIS — S52502D Unspecified fracture of the lower end of left radius, subsequent encounter for closed fracture with routine healing: Secondary | ICD-10-CM | POA: Diagnosis not present

## 2021-03-15 ENCOUNTER — Encounter: Payer: Self-pay | Admitting: Orthopaedic Surgery

## 2021-03-15 ENCOUNTER — Ambulatory Visit (INDEPENDENT_AMBULATORY_CARE_PROVIDER_SITE_OTHER): Payer: Medicare HMO | Admitting: Nurse Practitioner

## 2021-03-15 ENCOUNTER — Ambulatory Visit: Payer: Medicare HMO

## 2021-03-15 ENCOUNTER — Encounter: Payer: Self-pay | Admitting: Nurse Practitioner

## 2021-03-15 ENCOUNTER — Other Ambulatory Visit: Payer: Self-pay

## 2021-03-15 ENCOUNTER — Ambulatory Visit (INDEPENDENT_AMBULATORY_CARE_PROVIDER_SITE_OTHER): Payer: Medicare HMO | Admitting: Orthopaedic Surgery

## 2021-03-15 VITALS — Temp 98.1°F | Ht 62.0 in | Wt 194.0 lb

## 2021-03-15 VITALS — BP 147/98 | HR 119 | Ht 62.0 in | Wt 195.2 lb

## 2021-03-15 DIAGNOSIS — M79672 Pain in left foot: Secondary | ICD-10-CM | POA: Diagnosis not present

## 2021-03-15 DIAGNOSIS — M5441 Lumbago with sciatica, right side: Secondary | ICD-10-CM | POA: Diagnosis not present

## 2021-03-15 DIAGNOSIS — M5412 Radiculopathy, cervical region: Secondary | ICD-10-CM | POA: Diagnosis not present

## 2021-03-15 DIAGNOSIS — G8929 Other chronic pain: Secondary | ICD-10-CM

## 2021-03-15 DIAGNOSIS — R300 Dysuria: Secondary | ICD-10-CM | POA: Diagnosis not present

## 2021-03-15 DIAGNOSIS — M79662 Pain in left lower leg: Secondary | ICD-10-CM | POA: Diagnosis not present

## 2021-03-15 LAB — URINALYSIS, ROUTINE W REFLEX MICROSCOPIC
Bilirubin, UA: NEGATIVE
Glucose, UA: NEGATIVE
Ketones, UA: NEGATIVE
Leukocytes,UA: NEGATIVE
Nitrite, UA: NEGATIVE
Protein,UA: NEGATIVE
RBC, UA: NEGATIVE
Specific Gravity, UA: 1.01 (ref 1.005–1.030)
Urobilinogen, Ur: 0.2 mg/dL (ref 0.2–1.0)
pH, UA: 7 (ref 5.0–7.5)

## 2021-03-15 MED ORDER — SULFAMETHOXAZOLE-TRIMETHOPRIM 800-160 MG PO TABS: 1.0000 | ORAL_TABLET | Freq: Two times a day (BID) | ORAL | 0 refills | Status: DC

## 2021-03-15 MED ORDER — GABAPENTIN 800 MG PO TABS
800.0000 mg | ORAL_TABLET | Freq: Four times a day (QID) | ORAL | 2 refills | Status: DC
Start: 2021-03-15 — End: 2022-06-05

## 2021-03-15 MED ORDER — LIDOCAINE 5 % EX PTCH
1.0000 | MEDICATED_PATCH | CUTANEOUS | 2 refills | Status: DC
Start: 2021-03-15 — End: 2021-09-09

## 2021-03-15 NOTE — Patient Instructions (Signed)
To see Dr. Amedeo Kinsman

## 2021-03-15 NOTE — Assessment & Plan Note (Signed)
Symptoms of pain well managed with gabapentin 800 mg tablet total by mouth daily.  And lidocaine patch Rx refill sent to pharmacy.

## 2021-03-15 NOTE — Patient Instructions (Signed)
Dysuria ?Dysuria is pain or discomfort during urination. The pain or discomfort may be felt in the part of the body that drains urine from the bladder (urethra) or in the surrounding tissue of the genitals. The pain may also be felt in the groin area, lower abdomen, or lower back. ?You may have to urinate frequently or have the sudden feeling that you have to urinate (urgency). Dysuria can affect anyone, but it is more common in females. Dysuria can be caused by many different things, including: ?Urinary tract infection. ?Kidney stones or bladder stones. ?Certain STIs (sexually transmitted infections), such as chlamydia. ?Dehydration. ?Inflammation of the tissues of the vagina. ?Use of certain medicines. ?Use of certain soaps or scented products that cause irritation. ?Follow these instructions at home: ?Medicines ?Take over-the-counter and prescription medicines only as told by your health care provider. ?If you were prescribed an antibiotic medicine, take it as told by your health care provider. Do not stop taking the antibiotic even if you start to feel better. ?Eating and drinking ? ?Drink enough fluid to keep your urine pale yellow. ?Avoid caffeinated beverages, tea, and alcohol. These beverages can irritate the bladder and make dysuria worse. In males, alcohol may irritate the prostate. ?General instructions ?Watch your condition for any changes. ?Urinate often. Avoid holding urine for long periods of time. ?If you are female, you should wipe from front to back after urinating or having a bowel movement. Use each piece of toilet paper only once. ?Empty your bladder after sex. ?Keep all follow-up visits. This is important. ?If you had any tests done to find the cause of dysuria, it is up to you to get your test results. Ask your health care provider, or the department that is doing the test, when your results will be ready. ?Contact a health care provider if: ?You have a fever. ?You develop pain in your back or  sides. ?You have nausea or vomiting. ?You have blood in your urine. ?You are not urinating as often as you usually do. ?Get help right away if: ?Your pain is severe and not relieved with medicines. ?You cannot eat or drink without vomiting. ?You are confused. ?You have a rapid heartbeat while resting. ?You have shaking or chills. ?You feel extremely weak. ?Summary ?Dysuria is pain or discomfort while urinating. Many different conditions can lead to dysuria. ?If you have dysuria, you may have to urinate frequently or have the sudden feeling that you have to urinate (urgency). ?Watch your condition for any changes. Keep all follow-up visits. ?Make sure that you urinate often and drink enough fluid to keep your urine pale yellow. ?This information is not intended to replace advice given to you by your health care provider. Make sure you discuss any questions you have with your health care provider. ?Document Revised: 04/23/2020 Document Reviewed: 04/23/2020 ?Elsevier Patient Education ? 2022 [employer] ? ?

## 2021-03-15 NOTE — Progress Notes (Signed)
b

## 2021-03-15 NOTE — Assessment & Plan Note (Signed)
Symptoms currently managed with lidocaine patch.  Rx refill sent to pharmacy.

## 2021-03-15 NOTE — Progress Notes (Signed)
Acute Office Visit  Subjective:    Patient ID: Judy Evans, female    DOB: Jan 14, 1971, 50 y.o.   MRN: [id number]  Chief Complaint  Patient presents with   Urinary Frequency   Dysuria    Urinary Tract Infection  This is a new problem. The current episode started 1 to 4 weeks ago. The problem occurs every urination. The problem has been gradually worsening. The quality of the pain is described as burning. The pain is moderate. There has been no fever. Associated symptoms include flank pain, frequency and nausea. Pertinent negatives include no chills, discharge, hematuria, sweats or vomiting. She has tried nothing for the symptoms. Her past medical history is significant for recurrent UTIs.    Past Medical History:  Diagnosis Date   Anxiety    Aphasia    Arthritis    COPD (chronic obstructive pulmonary disease) (HCC)    DDD (degenerative disc disease), cervical    DDD (degenerative disc disease), lumbar    Degenerative disc disease at L5-S1 level    Fibromyalgia    GERD (gastroesophageal reflux disease)    H. pylori infection    Hyperlipidemia    States she does not have   Hypertension    States she does not have   IBS (irritable bowel syndrome)    LLQ pain 06/18/2015   Migraines    Moody 01/22/2015   Pain with urination 02/19/2015   Psoriasis    Scoliosis    Spondylolysis     Past Surgical History:  Procedure Laterality Date   ABDOMINAL HYSTERECTOMY     BIOPSY  09/14/2016   Procedure: BIOPSY;  Surgeon: Daneil Dolin, MD;  Location: [facility];  Service: Endoscopy;;  gastric    CARDIAC CATHETERIZATION  2015   patient reported it to be normal. "I was dying in pain during procedure".    CARPAL TUNNEL RELEASE Right    CESAREAN SECTION     CHOLECYSTECTOMY     COLONOSCOPY  06/2014   Altamese Dilling DeMason: normal   DILATION AND CURETTAGE OF UTERUS     ESOPHAGOGASTRODUODENOSCOPY  07/2014   Burke Keels: Normal   ESOPHAGOGASTRODUODENOSCOPY (EGD) WITH PROPOFOL N/A  09/14/2016   Dr. Gala Romney: Normal esophagus status post empiric dilation, erosive gastropathy with biopsies showing chronic inactive gastritis, no H pylori.   FOOT SURGERY Bilateral    removal of heel spurs   MALONEY DILATION  09/14/2016   Procedure: MALONEY DILATION;  Surgeon: Daneil Dolin, MD;  Location: [facility];  Service: Endoscopy;;   rotator cuff surgery Right    TONSILLECTOMY      Family History  Problem Relation Age of Onset   COPD Mother    Diabetes Mother    Osteoarthritis Mother    Atrial fibrillation Mother    Heart disease Mother    Healthy Father    Lupus Sister    CAD Sister    Diabetes Sister    Early death Brother        pneumonia   Asthma Daughter    Asthma Son    Cancer Maternal Grandmother 83       colon   Arthritis Maternal Grandmother    Diabetes Maternal Grandmother    Dementia Maternal Grandmother    Other Maternal Grandfather        brain tumor   Cancer Paternal Grandmother        breast   Cancer Paternal Grandfather        lung  Other Paternal Grandfather        brain aneurysm   Factor V Leiden deficiency Sister    Arthritis Sister    Psoriasis Sister     Social History   Socioeconomic History   Marital status: Legally Separated    Spouse name: Not on file   Number of children: 2   Years of education: GED   Highest education level: GED or equivalent  Occupational History   Occupation: Disabled  Tobacco Use   Smoking status: Every Day    Packs/day: 1.50    Years: 30.00    Pack years: 45.00    Types: Cigarettes   Smokeless tobacco: Never  Vaping Use   Vaping Use: Former  Substance and Sexual Activity   Alcohol use: No   Drug use: No   Sexual activity: Not Currently    Birth control/protection: Surgical    Comment: hyst  Other Topics Concern   Not on file  Social History Narrative   Caffeine use: Drinks coffee (2 cups per day), tea/soda- 2-3 cups per day   Right handed   Lives alone   Social Determinants of Health    Financial Resource Strain: Not on file  Food Insecurity: Not on file  Transportation Needs: Not on file  Physical Activity: Not on file  Stress: Not on file  Social Connections: Not on file  Intimate Partner Violence: Not on file    Outpatient Medications Prior to Visit  Medication Sig Dispense Refill   AJOVY 225 MG/1.5ML SOSY INJECT 1 SYRINGE UNDER THE SKIN EVERY 28 DAYS. 4.5 mL 3   albuterol (VENTOLIN HFA) 108 (90 Base) MCG/ACT inhaler USE 2 PUFFS BY MOUTH EVERY 4 HOURS AS NEEDED FOR WHEEZING OR SHORTNESS OF BREATH 18 each 2   amitriptyline (ELAVIL) 25 MG tablet TAKE 1 TO 2 TABLETS BY MOUTH AT BEDTIME 180 tablet 1   cetirizine (ZYRTEC) 10 MG tablet Take 10 mg by mouth daily.     COSENTYX SENSOREADY, 300 MG, 150 MG/ML SOAJ      cyclobenzaprine (FLEXERIL) 10 MG tablet Take 1 tablet (10 mg total) by mouth 2 (two) times daily as needed for muscle spasms. 25 tablet 0   fluticasone (FLONASE) 50 MCG/ACT nasal spray SPRAY 2 SPRAYS INTO EACH NOSTRIL EVERY DAY 48 mL 1   ipratropium-albuterol (DUONEB) 0.5-2.5 (3) MG/3ML SOLN Take 3 mLs by nebulization every 6 (six) hours as needed. 360 mL 2   meclizine (ANTIVERT) 25 MG tablet TAKE 1 TABLET (25 MG TOTAL) BY MOUTH 3 (THREE) TIMES DAILY AS NEEDED FOR DIZZINESS. 90 tablet 1   nystatin ointment (MYCOSTATIN) APPLY 1 APPLICATION TOPICALLY 2 (TWO) TIMES DAILY AS NEEDED. 30 g 0   omeprazole (PRILOSEC) 40 MG capsule 1 capsule by mouth daily 90 capsule 0   ondansetron (ZOFRAN) 4 MG tablet Take 1 tablet (4 mg total) by mouth every 8 (eight) hours as needed for nausea or vomiting. 20 tablet 0   triamcinolone cream (KENALOG) 0.1 %      triamcinolone ointment (KENALOG) 0.5 % Apply topically 2 (two) times daily.     gabapentin (NEURONTIN) 800 MG tablet Take 1 tablet (800 mg total) by mouth 4 (four) times daily. (Needs to be seen before next refill) 120 tablet 2   lidocaine (LIDODERM) 5 % Place 1 patch onto the skin daily. Remove & Discard patch within 12 hours  or as directed by MD 30 patch 2   cefdinir (OMNICEF) 300 MG capsule Take 1 capsule (300 mg total) by  mouth 2 (two) times daily. 1 po BID 20 capsule 0   HYDROcodone-acetaminophen (NORCO/VICODIN) 5-325 MG tablet Take one tab po q 4 hrs prn pain 10 tablet 0   HYDROcodone-acetaminophen (NORCO/VICODIN) 5-325 MG tablet Take 1 tablet by mouth every 6 (six) hours as needed. 15 tablet 0   ibuprofen (ADVIL,MOTRIN) 600 MG tablet Take 1 tablet (600 mg total) by mouth every 8 (eight) hours as needed. 15 tablet 0   nystatin (MYCOSTATIN) 100000 UNIT/ML suspension Take 1 teaspoon, swish, gargle for 1 minute and then spit up 4 times a day until symptoms improve. 60 mL 1   oxyCODONE (ROXICODONE) 5 MG immediate release tablet Take 1 tablet (5 mg total) by mouth every 6 (six) hours as needed for severe pain. 20 tablet 0   SUMAtriptan (IMITREX) 100 MG tablet Take 1 tablet (100 mg total) by mouth once as needed for up to 1 dose for migraine. May repeat in 2 hours if headache persists or recurs. 10 tablet 2   No facility-administered medications prior to visit.    Allergies  Allergen Reactions   Rosuvastatin Calcium Swelling    Ends up in the hospital   Statins Swelling    Ends up in the hospital   Wellbutrin [Bupropion]     suicidal   Hctz [Hydrochlorothiazide] Nausea And Vomiting and Rash   Hydrocodone-Acetaminophen Rash   Oxycodone Rash   Penicillins Nausea And Vomiting and Rash    Has patient had a PCN reaction causing immediate rash, facial/tongue/throat swelling, SOB or lightheadedness with hypotension: Yes Has patient had a PCN reaction causing severe rash involving mucus membranes or skin necrosis: No Has patient had a PCN reaction that required hospitalization No Has patient had a PCN reaction occurring within the last 10 years: No If all of the above answers are "NO", then may proceed with Cephalosporin use.     Review of Systems  Constitutional:  Negative for chills.  Gastrointestinal:   Positive for nausea. Negative for vomiting.  Genitourinary:  Positive for flank pain and frequency. Negative for hematuria.      Objective:    Physical Exam Vitals and nursing note reviewed.  Constitutional:      Appearance: Normal appearance.  HENT:     Head: Normocephalic.     Nose: Nose normal.  Eyes:     Conjunctiva/sclera: Conjunctivae normal.  Cardiovascular:     Rate and Rhythm: Normal rate and regular rhythm.     Pulses: Normal pulses.     Heart sounds: Normal heart sounds.  Pulmonary:     Effort: Pulmonary effort is normal.     Breath sounds: Normal breath sounds.  Abdominal:     Tenderness: There is right CVA tenderness and left CVA tenderness.  Musculoskeletal:        General: Normal range of motion.  Neurological:     Mental Status: She is alert and oriented to person, place, and time.    Temp 98.1 F (36.7 C) (Temporal)   Ht 5\' 2"  (1.575 m)   Wt 194 lb (88 kg)   SpO2 96%   BMI 35.48 kg/m  Wt Readings from Last 3 Encounters:  03/15/21 194 lb (88 kg)  02/02/21 195 lb (88.5 kg)  11/26/20 195 lb (88.5 kg)    Health Maintenance Due  Topic Date Due   Pneumococcal Vaccine 20-7 Years old (1 - PCV) Never done   HIV Screening  Never done   Hepatitis C Screening  Never done    There are  no preventive care reminders to display for this patient.   Lab Results  Component Value Date   TSH 1.230 03/22/2018   Lab Results  Component Value Date   WBC 11.9 (H) 11/24/2020   HGB 15.4 (H) 11/24/2020   HCT 46.9 (H) 11/24/2020   MCV 95.1 11/24/2020   PLT 231 11/24/2020   Lab Results  Component Value Date   NA 137 11/24/2020   K 3.8 11/24/2020   CO2 23 11/24/2020   GLUCOSE 150 (H) 11/24/2020   BUN 10 11/24/2020   CREATININE 0.70 11/24/2020   BILITOT 0.6 11/24/2020   ALKPHOS 70 11/24/2020   AST 27 11/24/2020   ALT 18 11/24/2020   PROT 6.9 11/24/2020   ALBUMIN 4.0 11/24/2020   CALCIUM 8.6 (L) 11/24/2020   ANIONGAP 9 11/24/2020   Lab Results   Component Value Date   CHOL 199 01/22/2017   Lab Results  Component Value Date   HDL 46 01/22/2017   Lab Results  Component Value Date   LDLCALC 116 (H) 01/22/2017   Lab Results  Component Value Date   TRIG 187 (H) 01/22/2017   Lab Results  Component Value Date   CHOLHDL 4.3 01/22/2017   Lab Results  Component Value Date   HGBA1C 5.5 03/22/2018       Assessment & Plan:   Problem List Items Addressed This Visit       Nervous and Auditory   Cervical radiculopathy    Symptoms of pain well managed with gabapentin 800 mg tablet total by mouth daily.  And lidocaine patch Rx refill sent to pharmacy.       Relevant Medications   gabapentin (NEURONTIN) 800 MG tablet   Bilateral low back pain with right-sided sciatica    Symptoms currently managed with lidocaine patch.  Rx refill sent to pharmacy.       Relevant Medications   lidocaine (LIDODERM) 5 %   gabapentin (NEURONTIN) 800 MG tablet     Other   Dysuria - Primary    New symptoms of dysuria in the last 14 days unresolved.  Patient reports CVA tenderness frequency and burning.  Completed urinalysis results negative.  Urine sent to culture to rule out resistant bacteria growth.  Treated patient with Bactrim DS pending cultures.  Tylenol or ibuprofen for pain as tolerated.  Follow-up with worsening or unresolved symptoms.       Relevant Medications   sulfamethoxazole-trimethoprim (BACTRIM DS) 800-160 MG tablet   Other Relevant Orders   Urinalysis, Routine w reflex microscopic (Completed)   Urine Culture   Pain of left lower leg   Relevant Medications   gabapentin (NEURONTIN) 800 MG tablet     Meds ordered this encounter  Medications   lidocaine (LIDODERM) 5 %    Sig: Place 1 patch onto the skin daily. Remove & Discard patch within 12 hours or as directed by MD    Dispense:  30 patch    Refill:  2    DX Code Needed  .    Order Specific Question:   Supervising Provider    Answer:   Janora Norlander  [[id number]]   gabapentin (NEURONTIN) 800 MG tablet    Sig: Take 1 tablet (800 mg total) by mouth 4 (four) times daily. (Needs to be seen before next refill)    Dispense:  120 tablet    Refill:  2    Order Specific Question:   Supervising Provider    Answer:   Janora Norlander [[id number]]  sulfamethoxazole-trimethoprim (BACTRIM DS) 800-160 MG tablet    Sig: Take 1 tablet by mouth 2 (two) times daily.    Dispense:  20 tablet    Refill:  0    Order Specific Question:   Supervising Provider    Answer:   Janora Norlander [[id number]]     Ivy Lynn, NP

## 2021-03-15 NOTE — Assessment & Plan Note (Signed)
New symptoms of dysuria in the last 14 days unresolved.  Patient reports CVA tenderness frequency and burning.  Completed urinalysis results negative.  Urine sent to culture to rule out resistant bacteria growth.  Treated patient with Bactrim DS pending cultures.  Tylenol or ibuprofen for pain as tolerated.  Follow-up with worsening or unresolved symptoms.
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
## 2021-03-16 ENCOUNTER — Telehealth: Payer: Self-pay | Admitting: *Deleted

## 2021-03-16 DIAGNOSIS — M25532 Pain in left wrist: Secondary | ICD-10-CM | POA: Diagnosis not present

## 2021-03-16 DIAGNOSIS — M65331 Trigger finger, right middle finger: Secondary | ICD-10-CM | POA: Diagnosis not present

## 2021-03-16 DIAGNOSIS — G90512 Complex regional pain syndrome I of left upper limb: Secondary | ICD-10-CM | POA: Diagnosis not present

## 2021-03-16 DIAGNOSIS — M25642 Stiffness of left hand, not elsewhere classified: Secondary | ICD-10-CM | POA: Diagnosis not present

## 2021-03-16 DIAGNOSIS — S52572A Other intraarticular fracture of lower end of left radius, initial encounter for closed fracture: Secondary | ICD-10-CM | POA: Diagnosis not present

## 2021-03-16 LAB — URIC ACID: Uric Acid, Serum: 5.1 mg/dL (ref 2.5–7.0)

## 2021-03-16 NOTE — Telephone Encounter (Signed)
Your request was denied We have denied coverage or payment under your Medicare Part D benefit for the following prescription drug(s)  that you or your prescriber requested: LIDOCAINE Patch Why did we deny your request? We denied this request under Medicare Part D because: The information provided by your prescriber did not  meet the requirements for covering this medication (prior authorization).  Your plan does not allow coverage of this medication based on your prescriber answering No to the following  question(s): Is the requested drug being prescribed for any of the following: A) Pain associated with post-herpetic  neuralgia, B) Pain associated with diabetic neuropathy, C) Pain associated with cancer-related neuropathy  (including treatment-related neuropathy [e.g., neuropathy associated with radiation treatment or  chemotherapy])? : You should share a copy of this decision with your prescriber so you and your prescriber can discuss next steps.  If your prescriber requested coverage on your behalf, we have shared this decision with your prescriber

## 2021-03-16 NOTE — Telephone Encounter (Signed)
PA in process  (Key: [id number]) Lidocaine 5% patches

## 2021-03-17 ENCOUNTER — Telehealth: Payer: Self-pay

## 2021-03-17 LAB — URINE CULTURE

## 2021-03-17 NOTE — Telephone Encounter (Signed)
Received report from [facility] for this patient. It shows she doesn't have gout. I showed the report to Dr. Luna Glasgow and was told to let patient know. I spoke with patient and relayed message and she was very thankful for the phone call.

## 2021-03-18 DIAGNOSIS — M25642 Stiffness of left hand, not elsewhere classified: Secondary | ICD-10-CM | POA: Diagnosis not present

## 2021-03-18 DIAGNOSIS — S52502D Unspecified fracture of the lower end of left radius, subsequent encounter for closed fracture with routine healing: Secondary | ICD-10-CM | POA: Diagnosis not present

## 2021-03-18 DIAGNOSIS — M79602 Pain in left arm: Secondary | ICD-10-CM | POA: Diagnosis not present

## 2021-03-18 DIAGNOSIS — G90512 Complex regional pain syndrome I of left upper limb: Secondary | ICD-10-CM | POA: Diagnosis not present

## 2021-03-21 DIAGNOSIS — G9059 Complex regional pain syndrome I of other specified site: Secondary | ICD-10-CM | POA: Diagnosis not present

## 2021-03-21 DIAGNOSIS — S52502D Unspecified fracture of the lower end of left radius, subsequent encounter for closed fracture with routine healing: Secondary | ICD-10-CM | POA: Diagnosis not present

## 2021-03-21 DIAGNOSIS — G90512 Complex regional pain syndrome I of left upper limb: Secondary | ICD-10-CM | POA: Diagnosis not present

## 2021-03-21 DIAGNOSIS — G629 Polyneuropathy, unspecified: Secondary | ICD-10-CM | POA: Diagnosis not present

## 2021-03-21 DIAGNOSIS — M79602 Pain in left arm: Secondary | ICD-10-CM | POA: Diagnosis not present

## 2021-03-22 ENCOUNTER — Ambulatory Visit: Payer: Medicare HMO

## 2021-03-22 DIAGNOSIS — J449 Chronic obstructive pulmonary disease, unspecified: Secondary | ICD-10-CM | POA: Diagnosis not present

## 2021-03-23 DIAGNOSIS — S52502D Unspecified fracture of the lower end of left radius, subsequent encounter for closed fracture with routine healing: Secondary | ICD-10-CM | POA: Diagnosis not present

## 2021-03-23 DIAGNOSIS — M25632 Stiffness of left wrist, not elsewhere classified: Secondary | ICD-10-CM | POA: Diagnosis not present

## 2021-03-23 DIAGNOSIS — G90512 Complex regional pain syndrome I of left upper limb: Secondary | ICD-10-CM | POA: Diagnosis not present

## 2021-03-23 DIAGNOSIS — M79602 Pain in left arm: Secondary | ICD-10-CM | POA: Diagnosis not present

## 2021-03-23 DIAGNOSIS — M25642 Stiffness of left hand, not elsewhere classified: Secondary | ICD-10-CM | POA: Diagnosis not present

## 2021-03-29 ENCOUNTER — Ambulatory Visit (INDEPENDENT_AMBULATORY_CARE_PROVIDER_SITE_OTHER): Payer: Medicare HMO | Admitting: Orthopedic Surgery

## 2021-03-29 ENCOUNTER — Encounter: Payer: Self-pay | Admitting: Physical Therapy

## 2021-03-29 ENCOUNTER — Ambulatory Visit: Payer: Medicare HMO | Attending: Orthopaedic Surgery | Admitting: Physical Therapy

## 2021-03-29 ENCOUNTER — Other Ambulatory Visit: Payer: Self-pay

## 2021-03-29 ENCOUNTER — Encounter: Payer: Self-pay | Admitting: Orthopedic Surgery

## 2021-03-29 VITALS — Ht 62.0 in | Wt 194.0 lb

## 2021-03-29 DIAGNOSIS — M79672 Pain in left foot: Secondary | ICD-10-CM | POA: Insufficient documentation
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
## 2021-03-29 NOTE — Patient Instructions (Signed)
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
## 2021-03-30 DIAGNOSIS — G90512 Complex regional pain syndrome I of left upper limb: Secondary | ICD-10-CM | POA: Diagnosis not present

## 2021-03-30 DIAGNOSIS — M79602 Pain in left arm: Secondary | ICD-10-CM | POA: Diagnosis not present

## 2021-03-30 DIAGNOSIS — M25642 Stiffness of left hand, not elsewhere classified: Secondary | ICD-10-CM | POA: Diagnosis not present

## 2021-03-30 DIAGNOSIS — M25632 Stiffness of left wrist, not elsewhere classified: Secondary | ICD-10-CM | POA: Diagnosis not present

## 2021-04-05 ENCOUNTER — Encounter: Payer: Self-pay | Admitting: Physical Therapy

## 2021-04-05 ENCOUNTER — Ambulatory Visit: Payer: Medicare HMO | Admitting: Physical Therapy

## 2021-04-05 ENCOUNTER — Other Ambulatory Visit: Payer: Self-pay

## 2021-04-05 DIAGNOSIS — M79672 Pain in left foot: Secondary | ICD-10-CM

## 2021-04-05 NOTE — Therapy (Signed)
[facility] [place], [place], [address] Phone: [phone]   Fax:  [phone]  Physical Therapy Treatment  Patient Details  Name: Judy Evans MRN: [id number] Date of Birth: [birth year] Referring Provider (PT): Sanjuana Kava MD   Encounter Date: 04/05/2021   PT End of Session - 04/05/21 1511     Visit Number 2    Number of Visits 12    Date for PT Re-Evaluation 05/10/21    Authorization Type PROGRESS NOTE AT 10TH VISIT.  KX MODIFIER AFTER 15 VISITS.    PT Start Time 0230    PT Stop Time 0315    PT Time Calculation (min) 45 min    Activity Tolerance Patient tolerated treatment well    Behavior During Therapy WFL for tasks assessed/performed             Past Medical History:  Diagnosis Date   Anxiety    Aphasia    Arthritis    COPD (chronic obstructive pulmonary disease) (HCC)    DDD (degenerative disc disease), cervical    DDD (degenerative disc disease), lumbar    Degenerative disc disease at L5-S1 level    Fibromyalgia    GERD (gastroesophageal reflux disease)    H. pylori infection    Hyperlipidemia    States she does not have   Hypertension    States she does not have   IBS (irritable bowel syndrome)    LLQ pain 06/18/2015   Migraines    Moody 01/22/2015   Pain with urination 02/19/2015   Psoriasis    Scoliosis    Spondylolysis     Past Surgical History:  Procedure Laterality Date   ABDOMINAL HYSTERECTOMY     BIOPSY  09/14/2016   Procedure: BIOPSY;  Surgeon: Daneil Dolin, MD;  [facility];  Service: Endoscopy;;  gastric    CARDIAC CATHETERIZATION  2015   patient reported it to be normal. "I was dying in pain during procedure".    CARPAL TUNNEL RELEASE Right    CESAREAN SECTION     CHOLECYSTECTOMY     COLONOSCOPY  06/2014   Altamese Dilling DeMason: normal   DILATION AND CURETTAGE OF UTERUS     ESOPHAGOGASTRODUODENOSCOPY  07/2014   Burke Keels: Normal   ESOPHAGOGASTRODUODENOSCOPY  (EGD) WITH PROPOFOL N/A 09/14/2016   Dr. Gala Romney: Normal esophagus status post empiric dilation, erosive gastropathy with biopsies showing chronic inactive gastritis, no H pylori.   FOOT SURGERY Bilateral    removal of heel spurs   MALONEY DILATION  09/14/2016   Procedure: MALONEY DILATION;  Surgeon: Daneil Dolin, MD;  [facility];  Service: Endoscopy;;   rotator cuff surgery Right    TONSILLECTOMY      There were no vitals filed for this visit.   Subjective Assessment - 04/05/21 1508     Subjective COVID-19 screen performed prior to patient entering clinic.  On my feet a lot over weekend on vacation. Ankle swelled.  Pain not too bad today.    Pertinent History OA, bilateral foot surgery to remove bone spurs, right RTC reapir, DDD, CTR on right, Fibromylagia, scoliosis.    How long can you walk comfortably? Up to an hour    Patient Stated Goals Walk normal without pain.    Currently in Pain? Yes    Pain Score 2     Pain Location Foot    Pain Orientation Left    Pain Descriptors / Indicators Aching    Pain  Onset More than a month ago                               [facility] Adult PT Treatment/Exercise - 04/05/21 0001       Modalities   Modalities Electrical Stimulation;Vasopneumatic;Ultrasound      Electrical Stimulation   Electrical Stimulation Location Left MTPJ's (plantar surface).    Electrical Stimulation Action Pre-mod.    Electrical Stimulation Parameters 80-150 Hz x 20 minutes.    Electrical Stimulation Goals Pain      Ultrasound   Ultrasound Location Left MTPJ's (plantar surface).    Ultrasound Parameters Small soundhead combo e'stim/US at 1.50 W/CM2 x 8 minutes on 3.3 Mhz and 50%.      Vasopneumatic   Number Minutes Vasopneumatic  20 minutes    Vasopnuematic Location  --   Left foot/ankle.   Vasopneumatic Pressure Low      Manual Therapy   Manual Therapy Soft tissue mobilization    Soft tissue mobilization Gentle STW/M to patient's  left MTPJ's (plantar surface) x 8 minutes.                         PT Long Term Goals - 03/29/21 1414       PT LONG TERM GOAL #1   Time 6    Period Weeks    Status New      PT LONG TERM GOAL #2   Title Walk one hour with left foot pain not > 3/10.    Time 6    Period Weeks    Status New                    Patient will benefit from skilled therapeutic intervention in order to improve the following deficits and impairments:     Visit Diagnosis: Pain in left foot     Problem List Patient Active Problem List   Diagnosis Date Noted   Subacromial bursitis of right shoulder joint 11/24/2020   Trochanteric bursitis of right hip 11/24/2020   Pain of left lower leg 11/15/2020   Bilateral low back pain with right-sided sciatica 11/15/2020   Thrush 11/15/2020   Insomnia 11/19/2018   Intractable headache 07/18/2018   Occipital neuralgia 07/18/2018   Family history of cerebral aneurysm 07/18/2018   Numbness 04/23/2018   Cervical radiculopathy 04/23/2018   Encounter for long-term use of opiate analgesic 04/22/2018   Lumbar degenerative disc disease 04/22/2018   Spondylosis of lumbar spine 04/22/2018   Fibromyalgia affecting multiple sites 04/22/2018   Constipation due to pain medication 04/30/2017   Depression 03/22/2017   Trochanteric bursitis of left hip 03/13/2017   Neck pain 11/13/2016   Left shoulder pain 11/13/2016   Chronic bilateral low back pain 11/13/2016   History of colonic polyps 06/20/2016   IBS (irritable bowel syndrome) 06/20/2016   Epigastric pain 06/20/2016   Rhinitis, chronic 04/13/2016   Otalgia of both ears 04/13/2016   Arthralgia of right temporomandibular joint 04/13/2016   BMI 40.0-44.9, adult ([place]) 11/22/2015   Bilateral carpal tunnel syndrome 11/22/2015   Tobacco abuse 11/22/2015   Allergic rhinitis 11/01/2015   Gastroesophageal reflux disease 11/01/2015   LLQ pain 06/18/2015   Bloating 06/18/2015   Pain with  urination 02/19/2015   Moody 01/22/2015   Pelvic pain in female 01/13/2015   Dysuria 01/13/2015   Hot flashes due to surgical menopause 01/13/2015   Pre-diabetes 01/13/2015  Hypertension 01/13/2015   Chronic migraine w/o aura, not intractable, w/o stat migr 01/13/2015   Psoriasis 01/13/2015   Arthritis 01/13/2015   Fibromyalgia 01/13/2015   COPD (chronic obstructive pulmonary disease) ([place]) 01/13/2015   Hyperlipidemia 01/13/2015   Abnormal nuclear stress test 01/07/2014   Abdominal pain 04/16/2013   Pain in joint involving multiple sites 02/20/2013   Head trauma 02/20/2013    Dru Primeau, [place] MPT 04/05/2021, 3:46 PM  [facility] [place], [place], [address] Phone: [phone]   Fax:  [phone]  Name: ZYLA DASCENZO MRN: [id number] Date of Birth: [birth year]

## 2021-04-06 DIAGNOSIS — M25642 Stiffness of left hand, not elsewhere classified: Secondary | ICD-10-CM | POA: Diagnosis not present

## 2021-04-06 DIAGNOSIS — M25632 Stiffness of left wrist, not elsewhere classified: Secondary | ICD-10-CM | POA: Diagnosis not present

## 2021-04-06 DIAGNOSIS — G90512 Complex regional pain syndrome I of left upper limb: Secondary | ICD-10-CM | POA: Diagnosis not present

## 2021-04-06 DIAGNOSIS — M47816 Spondylosis without myelopathy or radiculopathy, lumbar region: Secondary | ICD-10-CM | POA: Diagnosis not present

## 2021-04-06 DIAGNOSIS — Z8739 Personal history of other diseases of the musculoskeletal system and connective tissue: Secondary | ICD-10-CM | POA: Diagnosis not present

## 2021-04-06 DIAGNOSIS — M79602 Pain in left arm: Secondary | ICD-10-CM | POA: Diagnosis not present

## 2021-04-07 ENCOUNTER — Ambulatory Visit: Payer: Medicare HMO | Admitting: Physical Therapy

## 2021-04-07 ENCOUNTER — Other Ambulatory Visit: Payer: Self-pay

## 2021-04-07 DIAGNOSIS — M79672 Pain in left foot: Secondary | ICD-10-CM | POA: Diagnosis not present

## 2021-04-07 NOTE — Therapy (Signed)
[facility] [place], [place], [address] Phone: [phone]   Fax:  [phone]  Physical Therapy Treatment  Patient Details  Name: Judy Evans MRN: [id number] Date of Birth: 01/12/1971 Referring Provider (PT): Sanjuana Kava MD   Encounter Date: 04/07/2021   PT End of Session - 04/07/21 0955     Visit Number 3    Number of Visits 12    Date for PT Re-Evaluation 05/10/21    Authorization Type PROGRESS NOTE AT 10TH VISIT.  KX MODIFIER AFTER 15 VISITS.    PT Start Time 0902    PT Stop Time [phone]    PT Time Calculation (min) 41 min    Activity Tolerance Patient tolerated treatment well    Behavior During Therapy WFL for tasks assessed/performed             Past Medical History:  Diagnosis Date   Anxiety    Aphasia    Arthritis    COPD (chronic obstructive pulmonary disease) (HCC)    DDD (degenerative disc disease), cervical    DDD (degenerative disc disease), lumbar    Degenerative disc disease at L5-S1 level    Fibromyalgia    GERD (gastroesophageal reflux disease)    H. pylori infection    Hyperlipidemia    States she does not have   Hypertension    States she does not have   IBS (irritable bowel syndrome)    LLQ pain 06/18/2015   Migraines    Moody 01/22/2015   Pain with urination 02/19/2015   Psoriasis    Scoliosis    Spondylolysis     Past Surgical History:  Procedure Laterality Date   ABDOMINAL HYSTERECTOMY     BIOPSY  09/14/2016   Procedure: BIOPSY;  Surgeon: Daneil Dolin, MD;  [facility];  Service: Endoscopy;;  gastric    CARDIAC CATHETERIZATION  2015   patient reported it to be normal. "I was dying in pain during procedure".    CARPAL TUNNEL RELEASE Right    CESAREAN SECTION     CHOLECYSTECTOMY     COLONOSCOPY  06/2014   Altamese Dilling DeMason: normal   DILATION AND CURETTAGE OF UTERUS     ESOPHAGOGASTRODUODENOSCOPY  07/2014   Burke Keels: Normal   ESOPHAGOGASTRODUODENOSCOPY  (EGD) WITH PROPOFOL N/A 09/14/2016   Dr. Gala Romney: Normal esophagus status post empiric dilation, erosive gastropathy with biopsies showing chronic inactive gastritis, no H pylori.   FOOT SURGERY Bilateral    removal of heel spurs   MALONEY DILATION  09/14/2016   Procedure: MALONEY DILATION;  Surgeon: Daneil Dolin, MD;  [facility];  Service: Endoscopy;;   rotator cuff surgery Right    TONSILLECTOMY      There were no vitals filed for this visit.   Subjective Assessment - 04/07/21 0953     Subjective COVID-19 screen performed prior to patient entering clinic.  Pain at a 4 due to a very bust day yesterday.    Pertinent History OA, bilateral foot surgery to remove bone spurs, right RTC reapir, DDD, CTR on right, Fibromylagia, scoliosis.    How long can you walk comfortably? Up to an hour    Patient Stated Goals Walk normal without pain.    Currently in Pain? Yes    Pain Score 4     Pain Location Foot    Pain Orientation Left    Pain Descriptors / Indicators Burning    Pain Type Acute pain  Pain Onset More than a month ago                               [facility] Adult PT Treatment/Exercise - 04/07/21 0001       Modalities   Modalities Electrical Stimulation;Vasopneumatic      Electrical Stimulation   Electrical Stimulation Location Left MTPJ's (plantar surface).    Electrical Stimulation Action Pre-mod.    Electrical Stimulation Parameters 80-150 Hz x 15 minutes.    Electrical Stimulation Goals Pain      Ultrasound   Ultrasound Location Left MTPJ's (plantar surface).    Ultrasound Parameters [place] at 3.3 mHz, 20% at 1.20 W/CM2 x 8 minutes.      Vasopneumatic   Number Minutes Vasopneumatic  15 minutes    Vasopnuematic Location  --   Left foot/ankle.   Vasopneumatic Pressure Low      Manual Therapy   Manual Therapy Soft tissue mobilization    Soft tissue mobilization STW/M x 8 minutes to patient's affected left MTPJ's.                          PT Long Term Goals - 03/29/21 1414       PT LONG TERM GOAL #1   Time 6    Period Weeks    Status New      PT LONG TERM GOAL #2   Title Walk one hour with left foot pain not > 3/10.    Time 6    Period Weeks    Status New                   Plan - 04/07/21 1002     Clinical Impression Statement Patient reporting some increased pain today attributed to a very busy day yesterday.  Her left MTPJ's were palpably less painful today.    Personal Factors and Comorbidities Comorbidity 1;Comorbidity 2;Comorbidity 3+    Comorbidities OA, bilateral foot surgery to remove bone spurs, right RTC reapir, DDD, CTR on right, Fibromylagia, scoliosis.    Examination-Activity Limitations Other;Transfers    Examination-Participation Restrictions Other    Stability/Clinical Decision Making Stable/Uncomplicated    Rehab Potential Excellent    PT Frequency 2x / week    PT Duration 6 weeks    PT Treatment/Interventions ADLs/Self Care Home Management;Cryotherapy;Electrical Stimulation;Ultrasound;Moist Heat;Gait training;Stair training;Functional mobility training;Therapeutic activities;Therapeutic exercise;Manual techniques;Patient/family education;Passive range of motion;Vasopneumatic Device    PT Next Visit Plan Pulsed [place], gentle STW/m to patient's left foot, e'stim and vasopnuematic.  GT.    Consulted and Agree with Plan of Care Patient             Patient will benefit from skilled therapeutic intervention in order to improve the following deficits and impairments:  Abnormal gait, Decreased activity tolerance, Pain  Visit Diagnosis: Pain in left foot     Problem List Patient Active Problem List   Diagnosis Date Noted   Subacromial bursitis of right shoulder joint 11/24/2020   Trochanteric bursitis of right hip 11/24/2020   Pain of left lower leg 11/15/2020   Bilateral low back pain with right-sided sciatica 11/15/2020   Thrush 11/15/2020   Insomnia  11/19/2018   Intractable headache 07/18/2018   Occipital neuralgia 07/18/2018   Family history of cerebral aneurysm 07/18/2018   Numbness 04/23/2018   Cervical radiculopathy 04/23/2018   Encounter for long-term use of opiate analgesic 04/22/2018   Lumbar degenerative  disc disease 04/22/2018   Spondylosis of lumbar spine 04/22/2018   Fibromyalgia affecting multiple sites 04/22/2018   Constipation due to pain medication 04/30/2017   Depression 03/22/2017   Trochanteric bursitis of left hip 03/13/2017   Neck pain 11/13/2016   Left shoulder pain 11/13/2016   Chronic bilateral low back pain 11/13/2016   History of colonic polyps 06/20/2016   IBS (irritable bowel syndrome) 06/20/2016   Epigastric pain 06/20/2016   Rhinitis, chronic 04/13/2016   Otalgia of both ears 04/13/2016   Arthralgia of right temporomandibular joint 04/13/2016   BMI 40.0-44.9, adult ([place]) 11/22/2015   Bilateral carpal tunnel syndrome 11/22/2015   Tobacco abuse 11/22/2015   Allergic rhinitis 11/01/2015   Gastroesophageal reflux disease 11/01/2015   LLQ pain 06/18/2015   Bloating 06/18/2015   Pain with urination 02/19/2015   Moody 01/22/2015   Pelvic pain in female 01/13/2015   Dysuria 01/13/2015   Hot flashes due to surgical menopause 01/13/2015   Pre-diabetes 01/13/2015   Hypertension 01/13/2015   Chronic migraine w/o aura, not intractable, w/o stat migr 01/13/2015   Psoriasis 01/13/2015   Arthritis 01/13/2015   Fibromyalgia 01/13/2015   COPD (chronic obstructive pulmonary disease) ([place]) 01/13/2015   Hyperlipidemia 01/13/2015   Abnormal nuclear stress test 01/07/2014   Abdominal pain 04/16/2013   Pain in joint involving multiple sites 02/20/2013   Head trauma 02/20/2013    Akeelah Seppala, [place] MPT 04/07/2021, 10:04 AM  [facility] [place], [place], [address] Phone: [phone]   Fax:  [phone]  Name: Judy Evans MRN: [id number] Date  of Birth: 12/01/1970

## 2021-04-08 DIAGNOSIS — M25642 Stiffness of left hand, not elsewhere classified: Secondary | ICD-10-CM | POA: Diagnosis not present

## 2021-04-12 ENCOUNTER — Ambulatory Visit: Payer: Medicare HMO | Admitting: *Deleted

## 2021-04-12 ENCOUNTER — Other Ambulatory Visit: Payer: Self-pay

## 2021-04-12 DIAGNOSIS — M79672 Pain in left foot: Secondary | ICD-10-CM

## 2021-04-12 NOTE — Therapy (Signed)
[facility] [place], [place], [address] Phone: [phone]   Fax:  [phone]  Physical Therapy Treatment  Patient Details  Name: Judy Evans MRN: [id number] Date of Birth: [birth year] Referring Provider (PT): Sanjuana Kava MD   Encounter Date: 04/12/2021   PT End of Session - 04/12/21 1116     Visit Number 4    Number of Visits 12    Date for PT Re-Evaluation 05/10/21    Authorization Type PROGRESS NOTE AT 10TH VISIT.  KX MODIFIER AFTER 15 VISITS.    PT Start Time 1116    PT Stop Time 1205    PT Time Calculation (min) 49 min             Past Medical History:  Diagnosis Date   Anxiety    Aphasia    Arthritis    COPD (chronic obstructive pulmonary disease) (HCC)    DDD (degenerative disc disease), cervical    DDD (degenerative disc disease), lumbar    Degenerative disc disease at L5-S1 level    Fibromyalgia    GERD (gastroesophageal reflux disease)    H. pylori infection    Hyperlipidemia    States she does not have   Hypertension    States she does not have   IBS (irritable bowel syndrome)    LLQ pain 06/18/2015   Migraines    Moody 01/22/2015   Pain with urination 02/19/2015   Psoriasis    Scoliosis    Spondylolysis     Past Surgical History:  Procedure Laterality Date   ABDOMINAL HYSTERECTOMY     BIOPSY  09/14/2016   Procedure: BIOPSY;  Surgeon: Daneil Dolin, MD;  [facility];  Service: Endoscopy;;  gastric    CARDIAC CATHETERIZATION  2015   patient reported it to be normal. "I was dying in pain during procedure".    CARPAL TUNNEL RELEASE Right    CESAREAN SECTION     CHOLECYSTECTOMY     COLONOSCOPY  06/2014   Altamese Dilling DeMason: normal   DILATION AND CURETTAGE OF UTERUS     ESOPHAGOGASTRODUODENOSCOPY  07/2014   Burke Keels: Normal   ESOPHAGOGASTRODUODENOSCOPY (EGD) WITH PROPOFOL N/A 09/14/2016   Dr. Gala Romney: Normal esophagus status post empiric dilation, erosive gastropathy  with biopsies showing chronic inactive gastritis, no H pylori.   FOOT SURGERY Bilateral    removal of heel spurs   MALONEY DILATION  09/14/2016   Procedure: MALONEY DILATION;  Surgeon: Daneil Dolin, MD;  [facility];  Service: Endoscopy;;   rotator cuff surgery Right    TONSILLECTOMY      There were no vitals filed for this visit.   Subjective Assessment - 04/12/21 1115     Subjective COVID-19 screen performed prior to patient entering clinic.  Increased pain today due to hitting my LT foot yesterday    Pertinent History OA, bilateral foot surgery to remove bone spurs, right RTC reapir, DDD, CTR on right, Fibromylagia, scoliosis.    How long can you walk comfortably? Up to an hour    Patient Stated Goals Walk normal without pain.                               [facility] Adult PT Treatment/Exercise - 04/12/21 0001       Modalities   Modalities Electrical Stimulation;Vasopneumatic      Electrical Stimulation   Electrical Stimulation Location Left MTPJ's (plantar  surface).    Research officer, political party Parameters 80-150hz  x 15 mins    Electrical Stimulation Goals Pain      Ultrasound   Ultrasound Location LT MTPJ's    Ultrasound Parameters [place] @ 3.6mhz x 8 mins    Ultrasound Goals Pain      Vasopneumatic   Number Minutes Vasopneumatic  15 minutes    Vasopnuematic Location  --   Left foot/ankle.   Vasopneumatic Pressure Low    Vasopneumatic Temperature  34      Manual Therapy   Manual Therapy Soft tissue mobilization    Soft tissue mobilization STW/M x 8 minutes to patient's affected left MTPJ's.                         PT Long Term Goals - 03/29/21 1414       PT LONG TERM GOAL #1   Time 6    Period Weeks    Status New      PT LONG TERM GOAL #2   Title Walk one hour with left foot pain not > 3/10.    Time 6    Period Weeks    Status New                   Plan - 04/12/21  1156     Clinical Impression Statement Pt arrived today reporting increased pain due to stubbing her toes at home. She had some increased soreness today, but most of her pain was in her toes that she hit. Normal vaso/estim response today    Personal Factors and Comorbidities Comorbidity 1;Comorbidity 2;Comorbidity 3+    Comorbidities OA, bilateral foot surgery to remove bone spurs, right RTC reapir, DDD, CTR on right, Fibromylagia, scoliosis.    Examination-Activity Limitations Other;Transfers    Examination-Participation Restrictions Other    Stability/Clinical Decision Making Stable/Uncomplicated    Rehab Potential Excellent    PT Frequency 2x / week    PT Duration 6 weeks    PT Treatment/Interventions ADLs/Self Care Home Management;Cryotherapy;Electrical Stimulation;Ultrasound;Moist Heat;Gait training;Stair training;Functional mobility training;Therapeutic activities;Therapeutic exercise;Manual techniques;Patient/family education;Passive range of motion;Vasopneumatic Device    PT Next Visit Plan Pulsed [place], gentle STW/m to patient's left foot, e'stim and vasopnuematic.  GT.    Consulted and Agree with Plan of Care Patient             Patient will benefit from skilled therapeutic intervention in order to improve the following deficits and impairments:  Abnormal gait, Decreased activity tolerance, Pain  Visit Diagnosis: Pain in left foot     Problem List Patient Active Problem List   Diagnosis Date Noted   Subacromial bursitis of right shoulder joint 11/24/2020   Trochanteric bursitis of right hip 11/24/2020   Pain of left lower leg 11/15/2020   Bilateral low back pain with right-sided sciatica 11/15/2020   Thrush 11/15/2020   Insomnia 11/19/2018   Intractable headache 07/18/2018   Occipital neuralgia 07/18/2018   Family history of cerebral aneurysm 07/18/2018   Numbness 04/23/2018   Cervical radiculopathy 04/23/2018   Encounter for long-term use of opiate analgesic  04/22/2018   Lumbar degenerative disc disease 04/22/2018   Spondylosis of lumbar spine 04/22/2018   Fibromyalgia affecting multiple sites 04/22/2018   Constipation due to pain medication 04/30/2017   Depression 03/22/2017   Trochanteric bursitis of left hip 03/13/2017   Neck pain 11/13/2016   Left shoulder pain 11/13/2016   Chronic bilateral low  back pain 11/13/2016   History of colonic polyps 06/20/2016   IBS (irritable bowel syndrome) 06/20/2016   Epigastric pain 06/20/2016   Rhinitis, chronic 04/13/2016   Otalgia of both ears 04/13/2016   Arthralgia of right temporomandibular joint 04/13/2016   BMI 40.0-44.9, adult ([place]) 11/22/2015   Bilateral carpal tunnel syndrome 11/22/2015   Tobacco abuse 11/22/2015   Allergic rhinitis 11/01/2015   Gastroesophageal reflux disease 11/01/2015   LLQ pain 06/18/2015   Bloating 06/18/2015   Pain with urination 02/19/2015   Moody 01/22/2015   Pelvic pain in female 01/13/2015   Dysuria 01/13/2015   Hot flashes due to surgical menopause 01/13/2015   Pre-diabetes 01/13/2015   Hypertension 01/13/2015   Chronic migraine w/o aura, not intractable, w/o stat migr 01/13/2015   Psoriasis 01/13/2015   Arthritis 01/13/2015   Fibromyalgia 01/13/2015   COPD (chronic obstructive pulmonary disease) ([place]) 01/13/2015   Hyperlipidemia 01/13/2015   Abnormal nuclear stress test 01/07/2014   Abdominal pain 04/16/2013   Pain in joint involving multiple sites 02/20/2013   Head trauma 02/20/2013    Bryannah Boston,CHRIS, PTA 04/12/2021, 3:05 PM  [facility] [place], [place], [address] Phone: [phone]   Fax:  [phone]  Name: SHAQUANA BUEL MRN: [id number] Date of Birth: 12/23/1970

## 2021-04-14 ENCOUNTER — Other Ambulatory Visit: Payer: Self-pay

## 2021-04-14 ENCOUNTER — Ambulatory Visit: Payer: Medicare HMO | Admitting: Physical Therapy

## 2021-04-14 DIAGNOSIS — M79672 Pain in left foot: Secondary | ICD-10-CM

## 2021-04-14 NOTE — Therapy (Signed)
[facility] [place], [place], [address] Phone: [phone]   Fax:  [phone]  Physical Therapy Treatment  Patient Details  Name: Judy Evans MRN: [id number] Date of Birth: Apr 24, 1971 Referring Provider (PT): Sanjuana Kava MD   Encounter Date: 04/14/2021   PT End of Session - 04/14/21 1207     Visit Number 5    Number of Visits 12    Authorization Type PROGRESS NOTE AT 10TH VISIT.  KX MODIFIER AFTER 15 VISITS.    PT Start Time 1110    PT Stop Time 1159    PT Time Calculation (min) 49 min    Activity Tolerance Patient tolerated treatment well    Behavior During Therapy WFL for tasks assessed/performed             Past Medical History:  Diagnosis Date   Anxiety    Aphasia    Arthritis    COPD (chronic obstructive pulmonary disease) (HCC)    DDD (degenerative disc disease), cervical    DDD (degenerative disc disease), lumbar    Degenerative disc disease at L5-S1 level    Fibromyalgia    GERD (gastroesophageal reflux disease)    H. pylori infection    Hyperlipidemia    States she does not have   Hypertension    States she does not have   IBS (irritable bowel syndrome)    LLQ pain 06/18/2015   Migraines    Moody 01/22/2015   Pain with urination 02/19/2015   Psoriasis    Scoliosis    Spondylolysis     Past Surgical History:  Procedure Laterality Date   ABDOMINAL HYSTERECTOMY     BIOPSY  09/14/2016   Procedure: BIOPSY;  Surgeon: Daneil Dolin, MD;  [facility];  Service: Endoscopy;;  gastric    CARDIAC CATHETERIZATION  2015   patient reported it to be normal. "I was dying in pain during procedure".    CARPAL TUNNEL RELEASE Right    CESAREAN SECTION     CHOLECYSTECTOMY     COLONOSCOPY  06/2014   Altamese Dilling DeMason: normal   DILATION AND CURETTAGE OF UTERUS     ESOPHAGOGASTRODUODENOSCOPY  07/2014   Burke Keels: Normal   ESOPHAGOGASTRODUODENOSCOPY (EGD) WITH PROPOFOL N/A 09/14/2016    Dr. Gala Romney: Normal esophagus status post empiric dilation, erosive gastropathy with biopsies showing chronic inactive gastritis, no H pylori.   FOOT SURGERY Bilateral    removal of heel spurs   MALONEY DILATION  09/14/2016   Procedure: MALONEY DILATION;  Surgeon: Daneil Dolin, MD;  [facility];  Service: Endoscopy;;   rotator cuff surgery Right    TONSILLECTOMY      There were no vitals filed for this visit.   Subjective Assessment - 04/14/21 1208     Subjective Patient will benefit from skilled physical therapy intervention to address pain and deficits.  Doing better since hitting foot.    Pertinent History OA, bilateral foot surgery to remove bone spurs, right RTC reapir, DDD, CTR on right, Fibromylagia, scoliosis.    How long can you walk comfortably? Up to an hour    Patient Stated Goals Walk normal without pain.    Currently in Pain? Yes    Pain Score 2     Pain Location Foot    Pain Orientation Left    Pain Descriptors / Indicators Burning    Pain Type Acute pain    Pain Onset More than a month ago  [facility] Adult PT Treatment/Exercise - 04/14/21 0001       Modalities   Modalities Electrical Stimulation;Ultrasound      Electrical Stimulation   Electrical Stimulation Location Left foot.    Electrical Stimulation Action Pre-mod.    Electrical Stimulation Parameters 80-150 Hz x 15 minutes.    Electrical Stimulation Goals Pain      Ultrasound   Ultrasound Location LT MTPJ's    Ultrasound Parameters U/S at 1.50 W/CM2 at 3.3 mHz at 20% x 8 minutes.      Vasopneumatic   Number Minutes Vasopneumatic  15 minutes    Vasopnuematic Location  --   RT foot.   Vasopneumatic Pressure Low      Manual Therapy   Manual Therapy Soft tissue mobilization    Soft tissue mobilization STW/M x 15 minutes to plantar surface of patient's left foot and MT break region.                         PT Long Term Goals -  03/29/21 1414       PT LONG TERM GOAL #1   Time 6    Period Weeks    Status New      PT LONG TERM GOAL #2   Title Walk one hour with left foot pain not > 3/10.    Time 6    Period Weeks    Status New                   Plan - 04/14/21 1211     Clinical Impression Statement Excellent response to treatment today with no pain reported following her session today.    Personal Factors and Comorbidities Comorbidity 1;Comorbidity 2;Comorbidity 3+    Comorbidities OA, bilateral foot surgery to remove bone spurs, right RTC reapir, DDD, CTR on right, Fibromylagia, scoliosis.    Examination-Activity Limitations Other;Transfers    Examination-Participation Restrictions Other    Stability/Clinical Decision Making Stable/Uncomplicated    Rehab Potential Excellent    PT Frequency 2x / week    PT Duration 6 weeks    PT Treatment/Interventions ADLs/Self Care Home Management;Cryotherapy;Electrical Stimulation;Ultrasound;Moist Heat;Gait training;Stair training;Functional mobility training;Therapeutic activities;Therapeutic exercise;Manual techniques;Patient/family education;Passive range of motion;Vasopneumatic Device    PT Next Visit Plan Pulsed [place], gentle STW/m to patient's left foot, e'stim and vasopnuematic.  GT.             Patient will benefit from skilled therapeutic intervention in order to improve the following deficits and impairments:  Abnormal gait, Decreased activity tolerance, Pain  Visit Diagnosis: Pain in left foot     Problem List Patient Active Problem List   Diagnosis Date Noted   Subacromial bursitis of right shoulder joint 11/24/2020   Trochanteric bursitis of right hip 11/24/2020   Pain of left lower leg 11/15/2020   Bilateral low back pain with right-sided sciatica 11/15/2020   Thrush 11/15/2020   Insomnia 11/19/2018   Intractable headache 07/18/2018   Occipital neuralgia 07/18/2018   Family history of cerebral aneurysm 07/18/2018   Numbness  04/23/2018   Cervical radiculopathy 04/23/2018   Encounter for long-term use of opiate analgesic 04/22/2018   Lumbar degenerative disc disease 04/22/2018   Spondylosis of lumbar spine 04/22/2018   Fibromyalgia affecting multiple sites 04/22/2018   Constipation due to pain medication 04/30/2017   Depression 03/22/2017   Trochanteric bursitis of left hip 03/13/2017   Neck pain 11/13/2016   Left shoulder pain 11/13/2016   Chronic bilateral low back  pain 11/13/2016   History of colonic polyps 06/20/2016   IBS (irritable bowel syndrome) 06/20/2016   Epigastric pain 06/20/2016   Rhinitis, chronic 04/13/2016   Otalgia of both ears 04/13/2016   Arthralgia of right temporomandibular joint 04/13/2016   BMI 40.0-44.9, adult ([place]) 11/22/2015   Bilateral carpal tunnel syndrome 11/22/2015   Tobacco abuse 11/22/2015   Allergic rhinitis 11/01/2015   Gastroesophageal reflux disease 11/01/2015   LLQ pain 06/18/2015   Bloating 06/18/2015   Pain with urination 02/19/2015   Moody 01/22/2015   Pelvic pain in female 01/13/2015   Dysuria 01/13/2015   Hot flashes due to surgical menopause 01/13/2015   Pre-diabetes 01/13/2015   Hypertension 01/13/2015   Chronic migraine w/o aura, not intractable, w/o stat migr 01/13/2015   Psoriasis 01/13/2015   Arthritis 01/13/2015   Fibromyalgia 01/13/2015   COPD (chronic obstructive pulmonary disease) ([place]) 01/13/2015   Hyperlipidemia 01/13/2015   Abnormal nuclear stress test 01/07/2014   Abdominal pain 04/16/2013   Pain in joint involving multiple sites 02/20/2013   Head trauma 02/20/2013    Sathvika Ojo, [place] MPT 04/14/2021, 12:12 PM  [facility] [address] [place], [place], [address] Phone: [phone]   Fax:  [phone]  Name: Judy Evans MRN: [id number] Date of Birth: [birth year]

## 2021-04-15 DIAGNOSIS — M79602 Pain in left arm: Secondary | ICD-10-CM | POA: Diagnosis not present

## 2021-04-15 DIAGNOSIS — M25642 Stiffness of left hand, not elsewhere classified: Secondary | ICD-10-CM | POA: Diagnosis not present

## 2021-04-15 DIAGNOSIS — S52502D Unspecified fracture of the lower end of left radius, subsequent encounter for closed fracture with routine healing: Secondary | ICD-10-CM | POA: Diagnosis not present

## 2021-04-15 DIAGNOSIS — G90512 Complex regional pain syndrome I of left upper limb: Secondary | ICD-10-CM | POA: Diagnosis not present

## 2021-04-15 DIAGNOSIS — M25632 Stiffness of left wrist, not elsewhere classified: Secondary | ICD-10-CM | POA: Diagnosis not present

## 2021-04-19 ENCOUNTER — Ambulatory Visit: Payer: Medicare HMO | Admitting: Physical Therapy

## 2021-04-19 ENCOUNTER — Other Ambulatory Visit: Payer: Self-pay

## 2021-04-19 DIAGNOSIS — M79672 Pain in left foot: Secondary | ICD-10-CM

## 2021-04-19 NOTE — Therapy (Signed)
[facility] [place], [place], [address] Phone: [phone]   Fax:  [phone]  Physical Therapy Treatment  Patient Details  Name: Judy Evans MRN: [id number] Date of Birth: 06/21/1971 Referring Provider (PT): Sanjuana Kava MD   Encounter Date: 04/19/2021   PT End of Session - 04/19/21 1153     Visit Number 6    Number of Visits 12    Date for PT Re-Evaluation 05/10/21    Authorization Type PROGRESS NOTE AT 10TH VISIT.  KX MODIFIER AFTER 15 VISITS.    PT Start Time 1117    PT Stop Time 1156    PT Time Calculation (min) 39 min    Activity Tolerance Patient tolerated treatment well    Behavior During Therapy WFL for tasks assessed/performed             Past Medical History:  Diagnosis Date   Anxiety    Aphasia    Arthritis    COPD (chronic obstructive pulmonary disease) (HCC)    DDD (degenerative disc disease), cervical    DDD (degenerative disc disease), lumbar    Degenerative disc disease at L5-S1 level    Fibromyalgia    GERD (gastroesophageal reflux disease)    H. pylori infection    Hyperlipidemia    States she does not have   Hypertension    States she does not have   IBS (irritable bowel syndrome)    LLQ pain 06/18/2015   Migraines    Moody 01/22/2015   Pain with urination 02/19/2015   Psoriasis    Scoliosis    Spondylolysis     Past Surgical History:  Procedure Laterality Date   ABDOMINAL HYSTERECTOMY     BIOPSY  09/14/2016   Procedure: BIOPSY;  Surgeon: Daneil Dolin, MD;  [facility];  Service: Endoscopy;;  gastric    CARDIAC CATHETERIZATION  2015   patient reported it to be normal. "I was dying in pain during procedure".    CARPAL TUNNEL RELEASE Right    CESAREAN SECTION     CHOLECYSTECTOMY     COLONOSCOPY  06/2014   Altamese Dilling DeMason: normal   DILATION AND CURETTAGE OF UTERUS     ESOPHAGOGASTRODUODENOSCOPY  07/2014   Burke Keels: Normal   ESOPHAGOGASTRODUODENOSCOPY  (EGD) WITH PROPOFOL N/A 09/14/2016   Dr. Gala Romney: Normal esophagus status post empiric dilation, erosive gastropathy with biopsies showing chronic inactive gastritis, no H pylori.   FOOT SURGERY Bilateral    removal of heel spurs   MALONEY DILATION  09/14/2016   Procedure: MALONEY DILATION;  Surgeon: Daneil Dolin, MD;  [facility];  Service: Endoscopy;;   rotator cuff surgery Right    TONSILLECTOMY      There were no vitals filed for this visit.   Subjective Assessment - 04/19/21 1153     Subjective COVID-19 screen performed prior to patient entering clinic.  Foot is much better.  Sometimes feel heel pain.    Pertinent History OA, bilateral foot surgery to remove bone spurs, right RTC reapir, DDD, CTR on right, Fibromylagia, scoliosis.    How long can you walk comfortably? Up to an hour    Patient Stated Goals Walk normal without pain.    Currently in Pain? Yes    Pain Score 2     Pain Location Foot    Pain Orientation Left    Pain Descriptors / Indicators Burning    Pain Type Acute pain    Pain Onset  More than a month ago                               [facility] Adult PT Treatment/Exercise - 04/19/21 0001       Electrical Stimulation   Electrical Stimulation Location Left foot.    Electrical Stimulation Action Pre-mod.    Electrical Stimulation Parameters 80-150 Hz x 15 minutes.    Electrical Stimulation Goals Pain      Ultrasound   Ultrasound Location LT MTPJ's    Ultrasound Parameters U/S at 1.50 W/CM2 at 20% 0n 3.3 mHz x 8 minutes.      Vasopneumatic   Number Minutes Vasopneumatic  15 minutes    Vasopnuematic Location  --   Left foot/ankle.   Vasopneumatic Pressure Low      Manual Therapy   Manual Therapy Soft tissue mobilization    Soft tissue mobilization STW/M x 8 minutes.                         PT Long Term Goals - 03/29/21 1414       PT LONG TERM GOAL #1   Time 6    Period Weeks    Status New      PT LONG  TERM GOAL #2   Title Walk one hour with left foot pain not > 3/10.    Time 6    Period Weeks    Status New                   Plan - 04/19/21 1200     Clinical Impression Statement The patient reporting her foot is much better.  She does have occasions of heel pain that radiates to her ankle that will affect how she walks.    Personal Factors and Comorbidities Comorbidity 1;Comorbidity 2;Comorbidity 3+    Comorbidities OA, bilateral foot surgery to remove bone spurs, right RTC reapir, DDD, CTR on right, Fibromylagia, scoliosis.    Examination-Activity Limitations Other;Transfers    Examination-Participation Restrictions Other    Stability/Clinical Decision Making Stable/Uncomplicated    Rehab Potential Excellent    PT Frequency 2x / week    PT Duration 6 weeks    PT Treatment/Interventions ADLs/Self Care Home Management;Cryotherapy;Electrical Stimulation;Ultrasound;Moist Heat;Gait training;Stair training;Functional mobility training;Therapeutic activities;Therapeutic exercise;Manual techniques;Patient/family education;Passive range of motion;Vasopneumatic Device    PT Next Visit Plan Pulsed [place], gentle STW/m to patient's left foot, e'stim and vasopnuematic.  GT.    Consulted and Agree with Plan of Care Patient             Patient will benefit from skilled therapeutic intervention in order to improve the following deficits and impairments:  Abnormal gait, Decreased activity tolerance, Pain  Visit Diagnosis: Pain in left foot     Problem List Patient Active Problem List   Diagnosis Date Noted   Subacromial bursitis of right shoulder joint 11/24/2020   Trochanteric bursitis of right hip 11/24/2020   Pain of left lower leg 11/15/2020   Bilateral low back pain with right-sided sciatica 11/15/2020   Thrush 11/15/2020   Insomnia 11/19/2018   Intractable headache 07/18/2018   Occipital neuralgia 07/18/2018   Family history of cerebral aneurysm 07/18/2018   Numbness  04/23/2018   Cervical radiculopathy 04/23/2018   Encounter for long-term use of opiate analgesic 04/22/2018   Lumbar degenerative disc disease 04/22/2018   Spondylosis of lumbar spine 04/22/2018   Fibromyalgia affecting multiple sites  04/22/2018   Constipation due to pain medication 04/30/2017   Depression 03/22/2017   Trochanteric bursitis of left hip 03/13/2017   Neck pain 11/13/2016   Left shoulder pain 11/13/2016   Chronic bilateral low back pain 11/13/2016   History of colonic polyps 06/20/2016   IBS (irritable bowel syndrome) 06/20/2016   Epigastric pain 06/20/2016   Rhinitis, chronic 04/13/2016   Otalgia of both ears 04/13/2016   Arthralgia of right temporomandibular joint 04/13/2016   BMI 40.0-44.9, adult ([place]) 11/22/2015   Bilateral carpal tunnel syndrome 11/22/2015   Tobacco abuse 11/22/2015   Allergic rhinitis 11/01/2015   Gastroesophageal reflux disease 11/01/2015   LLQ pain 06/18/2015   Bloating 06/18/2015   Pain with urination 02/19/2015   Moody 01/22/2015   Pelvic pain in female 01/13/2015   Dysuria 01/13/2015   Hot flashes due to surgical menopause 01/13/2015   Pre-diabetes 01/13/2015   Hypertension 01/13/2015   Chronic migraine w/o aura, not intractable, w/o stat migr 01/13/2015   Psoriasis 01/13/2015   Arthritis 01/13/2015   Fibromyalgia 01/13/2015   COPD (chronic obstructive pulmonary disease) ([place]) 01/13/2015   Hyperlipidemia 01/13/2015   Abnormal nuclear stress test 01/07/2014   Abdominal pain 04/16/2013   Pain in joint involving multiple sites 02/20/2013   Head trauma 02/20/2013    Larin Weissberg, [place] MPT 04/19/2021, 12:04 PM  [facility] [address] [place], [place], [address] Phone: [phone]   Fax:  [phone]  Name: Judy Evans MRN: [id number] Date of Birth: 1971/07/05

## 2021-04-20 DIAGNOSIS — M25642 Stiffness of left hand, not elsewhere classified: Secondary | ICD-10-CM | POA: Diagnosis not present

## 2021-04-20 DIAGNOSIS — G90512 Complex regional pain syndrome I of left upper limb: Secondary | ICD-10-CM | POA: Diagnosis not present

## 2021-04-20 DIAGNOSIS — M25632 Stiffness of left wrist, not elsewhere classified: Secondary | ICD-10-CM | POA: Diagnosis not present

## 2021-04-20 DIAGNOSIS — S52502D Unspecified fracture of the lower end of left radius, subsequent encounter for closed fracture with routine healing: Secondary | ICD-10-CM | POA: Diagnosis not present

## 2021-04-21 ENCOUNTER — Other Ambulatory Visit: Payer: Self-pay

## 2021-04-21 ENCOUNTER — Ambulatory Visit: Payer: Medicare HMO | Admitting: Physical Therapy

## 2021-04-21 DIAGNOSIS — M79672 Pain in left foot: Secondary | ICD-10-CM | POA: Diagnosis not present

## 2021-04-21 DIAGNOSIS — J449 Chronic obstructive pulmonary disease, unspecified: Secondary | ICD-10-CM | POA: Diagnosis not present

## 2021-04-21 NOTE — Therapy (Signed)
[facility] [place], [place], [address] Phone: [phone]   Fax:  [phone]  Physical Therapy Treatment  Patient Details  Name: Judy Evans MRN: [id number] Date of Birth: Feb 06, 1971 Referring Provider (PT): Sanjuana Kava MD   Encounter Date: 04/21/2021   PT End of Session - 04/21/21 1207     Visit Number 7    Number of Visits 12    Date for PT Re-Evaluation 05/10/21    Authorization Type PROGRESS NOTE AT 10TH VISIT.  KX MODIFIER AFTER 15 VISITS.    PT Start Time 1115    PT Stop Time 1201    PT Time Calculation (min) 46 min    Behavior During Therapy WFL for tasks assessed/performed             Past Medical History:  Diagnosis Date   Anxiety    Aphasia    Arthritis    COPD (chronic obstructive pulmonary disease) (HCC)    DDD (degenerative disc disease), cervical    DDD (degenerative disc disease), lumbar    Degenerative disc disease at L5-S1 level    Fibromyalgia    GERD (gastroesophageal reflux disease)    H. pylori infection    Hyperlipidemia    States she does not have   Hypertension    States she does not have   IBS (irritable bowel syndrome)    LLQ pain 06/18/2015   Migraines    Moody 01/22/2015   Pain with urination 02/19/2015   Psoriasis    Scoliosis    Spondylolysis     Past Surgical History:  Procedure Laterality Date   ABDOMINAL HYSTERECTOMY     BIOPSY  09/14/2016   Procedure: BIOPSY;  Surgeon: Daneil Dolin, MD;  [facility];  Service: Endoscopy;;  gastric    CARDIAC CATHETERIZATION  2015   patient reported it to be normal. "I was dying in pain during procedure".    CARPAL TUNNEL RELEASE Right    CESAREAN SECTION     CHOLECYSTECTOMY     COLONOSCOPY  06/2014   Altamese Dilling DeMason: normal   DILATION AND CURETTAGE OF UTERUS     ESOPHAGOGASTRODUODENOSCOPY  07/2014   Burke Keels: Normal   ESOPHAGOGASTRODUODENOSCOPY (EGD) WITH PROPOFOL N/A 09/14/2016   Dr. Gala Romney: Normal  esophagus status post empiric dilation, erosive gastropathy with biopsies showing chronic inactive gastritis, no H pylori.   FOOT SURGERY Bilateral    removal of heel spurs   MALONEY DILATION  09/14/2016   Procedure: MALONEY DILATION;  Surgeon: Daneil Dolin, MD;  [facility];  Service: Endoscopy;;   rotator cuff surgery Right    TONSILLECTOMY      There were no vitals filed for this visit.   Subjective Assessment - 04/21/21 1203     Subjective COVID-19 screen performed prior to patient entering clinic.  Toes are much better.  Pain in heel and side of ankle.    Pertinent History OA, bilateral foot surgery to remove bone spurs, right RTC reapir, DDD, CTR on right, Fibromylagia, scoliosis.    Patient Stated Goals Walk normal without pain.    Currently in Pain? Yes    Pain Score 3     Pain Location Heel    Pain Orientation Left    Pain Descriptors / Indicators Aching    Pain Type Acute pain    Pain Onset More than a month ago  [facility] Adult PT Treatment/Exercise - 04/21/21 0001       Modalities   Modalities Electrical Stimulation;Vasopneumatic      Electrical Stimulation   Electrical Stimulation Location Left heel and lateral ankle.    Electrical Stimulation Action Pre-mod.    Electrical Stimulation Parameters 80-150 Hz x 20 minutes.    Electrical Stimulation Goals Pain      Ultrasound   Ultrasound Location Left heel.    Ultrasound Parameters U/S at 1.50 W/CM2 at 3.3 mHz at 20% scan x 8 minutes.      Vasopneumatic   Number Minutes Vasopneumatic  15 minutes    Vasopnuematic Location  --   Right foot/ankle.   Vasopneumatic Pressure Low      Manual Therapy   Manual Therapy Soft tissue mobilization    Soft tissue mobilization STW/M x 8 minutes to patient's left heel and lateral ankle.                         PT Long Term Goals - 03/29/21 1414       PT LONG TERM GOAL #1   Time 6    Period  Weeks    Status New      PT LONG TERM GOAL #2   Title Walk one hour with left foot pain not > 3/10.    Time 6    Period Weeks    Status New                   Plan - 04/21/21 1206     Clinical Impression Statement Pain over left heel and lateral ankle.  Patient feels pain in heel and then feels like her heel rolls in response to the pain.  Her left ankle strength is normal.  She tolerated treatment today without complaint.    Personal Factors and Comorbidities Comorbidity 1;Comorbidity 2;Comorbidity 3+    Examination-Activity Limitations Other;Transfers             Patient will benefit from skilled therapeutic intervention in order to improve the following deficits and impairments:     Visit Diagnosis: Pain in left foot     Problem List Patient Active Problem List   Diagnosis Date Noted   Subacromial bursitis of right shoulder joint 11/24/2020   Trochanteric bursitis of right hip 11/24/2020   Pain of left lower leg 11/15/2020   Bilateral low back pain with right-sided sciatica 11/15/2020   Thrush 11/15/2020   Insomnia 11/19/2018   Intractable headache 07/18/2018   Occipital neuralgia 07/18/2018   Family history of cerebral aneurysm 07/18/2018   Numbness 04/23/2018   Cervical radiculopathy 04/23/2018   Encounter for long-term use of opiate analgesic 04/22/2018   Lumbar degenerative disc disease 04/22/2018   Spondylosis of lumbar spine 04/22/2018   Fibromyalgia affecting multiple sites 04/22/2018   Constipation due to pain medication 04/30/2017   Depression 03/22/2017   Trochanteric bursitis of left hip 03/13/2017   Neck pain 11/13/2016   Left shoulder pain 11/13/2016   Chronic bilateral low back pain 11/13/2016   History of colonic polyps 06/20/2016   IBS (irritable bowel syndrome) 06/20/2016   Epigastric pain 06/20/2016   Rhinitis, chronic 04/13/2016   Otalgia of both ears 04/13/2016   Arthralgia of right temporomandibular joint 04/13/2016   BMI  40.0-44.9, adult ([place]) 11/22/2015   Bilateral carpal tunnel syndrome 11/22/2015   Tobacco abuse 11/22/2015   Allergic rhinitis 11/01/2015   Gastroesophageal reflux disease 11/01/2015   LLQ pain 06/18/2015  Bloating 06/18/2015   Pain with urination 02/19/2015   Moody 01/22/2015   Pelvic pain in female 01/13/2015   Dysuria 01/13/2015   Hot flashes due to surgical menopause 01/13/2015   Pre-diabetes 01/13/2015   Hypertension 01/13/2015   Chronic migraine w/o aura, not intractable, w/o stat migr 01/13/2015   Psoriasis 01/13/2015   Arthritis 01/13/2015   Fibromyalgia 01/13/2015   COPD (chronic obstructive pulmonary disease) ([place]) 01/13/2015   Hyperlipidemia 01/13/2015   Abnormal nuclear stress test 01/07/2014   Abdominal pain 04/16/2013   Pain in joint involving multiple sites 02/20/2013   Head trauma 02/20/2013    Wenonah Milo, [place] MPT 04/21/2021, 12:09 PM  [facility] [place], [place], [address] Phone: [phone]   Fax:  [phone]  Name: Judy Evans MRN: [id number] Date of Birth: May 13, 1971

## 2021-04-22 DIAGNOSIS — M25632 Stiffness of left wrist, not elsewhere classified: Secondary | ICD-10-CM | POA: Diagnosis not present

## 2021-04-22 DIAGNOSIS — G90512 Complex regional pain syndrome I of left upper limb: Secondary | ICD-10-CM | POA: Diagnosis not present

## 2021-04-22 DIAGNOSIS — S52502D Unspecified fracture of the lower end of left radius, subsequent encounter for closed fracture with routine healing: Secondary | ICD-10-CM | POA: Diagnosis not present

## 2021-04-22 DIAGNOSIS — M25642 Stiffness of left hand, not elsewhere classified: Secondary | ICD-10-CM | POA: Diagnosis not present

## 2021-04-26 ENCOUNTER — Encounter: Payer: Self-pay | Admitting: Orthopedic Surgery

## 2021-04-26 ENCOUNTER — Ambulatory Visit: Payer: Medicare HMO | Attending: Orthopaedic Surgery | Admitting: Physical Therapy

## 2021-04-26 ENCOUNTER — Other Ambulatory Visit: Payer: Self-pay

## 2021-04-26 ENCOUNTER — Ambulatory Visit (INDEPENDENT_AMBULATORY_CARE_PROVIDER_SITE_OTHER): Payer: Medicare HMO | Admitting: Orthopedic Surgery

## 2021-04-26 VITALS — BP 131/84 | HR 86 | Ht 62.0 in | Wt 199.2 lb

## 2021-04-26 DIAGNOSIS — M79672 Pain in left foot: Secondary | ICD-10-CM | POA: Diagnosis not present
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
## 2021-04-26 NOTE — Therapy (Signed)
[facility] [place], [place], [address] Phone: [phone]   Fax:  [phone]  Physical Therapy Treatment  Patient Details  Name: Judy Evans MRN: [id number] Date of Birth: [birth year] Referring Provider (PT): Sanjuana Kava MD   Encounter Date: 04/26/2021   PT End of Session - 04/26/21 1556     Visit Number 8    Number of Visits 12    Date for PT Re-Evaluation 05/10/21    Authorization Type PROGRESS NOTE AT 10TH VISIT.  KX MODIFIER AFTER 15 VISITS.    PT Start Time 0145    PT Stop Time 0222    PT Time Calculation (min) 37 min    Activity Tolerance Patient tolerated treatment well    Behavior During Therapy WFL for tasks assessed/performed             Past Medical History:  Diagnosis Date   Anxiety    Aphasia    Arthritis    COPD (chronic obstructive pulmonary disease) (HCC)    DDD (degenerative disc disease), cervical    DDD (degenerative disc disease), lumbar    Degenerative disc disease at L5-S1 level    Fibromyalgia    GERD (gastroesophageal reflux disease)    H. pylori infection    Hyperlipidemia    States she does not have   Hypertension    States she does not have   IBS (irritable bowel syndrome)    LLQ pain 06/18/2015   Migraines    Moody 01/22/2015   Pain with urination 02/19/2015   Psoriasis    Scoliosis    Spondylolysis     Past Surgical History:  Procedure Laterality Date   ABDOMINAL HYSTERECTOMY     BIOPSY  09/14/2016   Procedure: BIOPSY;  Surgeon: Daneil Dolin, MD;  [facility];  Service: Endoscopy;;  gastric    CARDIAC CATHETERIZATION  2015   patient reported it to be normal. "I was dying in pain during procedure".    CARPAL TUNNEL RELEASE Right    CESAREAN SECTION     CHOLECYSTECTOMY     COLONOSCOPY  06/2014   Altamese Dilling DeMason: normal   DILATION AND CURETTAGE OF UTERUS     ESOPHAGOGASTRODUODENOSCOPY  07/2014   Burke Keels: Normal   ESOPHAGOGASTRODUODENOSCOPY  (EGD) WITH PROPOFOL N/A 09/14/2016   Dr. Gala Romney: Normal esophagus status post empiric dilation, erosive gastropathy with biopsies showing chronic inactive gastritis, no H pylori.   FOOT SURGERY Bilateral    removal of heel spurs   MALONEY DILATION  09/14/2016   Procedure: MALONEY DILATION;  Surgeon: Daneil Dolin, MD;  [facility];  Service: Endoscopy;;   rotator cuff surgery Right    TONSILLECTOMY      There were no vitals filed for this visit.   Subjective Assessment - 04/26/21 1430     Subjective COVID-19 screen performed prior to patient entering clinic.  No foot or heel pain today.  Patient still has some fear related to walking but is very pleased with her progress.    Pertinent History OA, bilateral foot surgery to remove bone spurs, right RTC reapir, DDD, CTR on right, Fibromylagia, scoliosis.    How long can you walk comfortably? Up to an hour    Patient Stated Goals Walk normal without pain.    Currently in Pain? Yes    Pain Score 0-No pain  [facility] Adult PT Treatment/Exercise - 04/26/21 0001       Exercises   Exercises Knee/Hip;Ankle      Knee/Hip Exercises: Aerobic   Nustep Level 3 x 10 minutes LE's only.      Vasopneumatic   Number Minutes Vasopneumatic  15 minutes    Vasopnuematic Location  --   Left ankle.   Vasopneumatic Pressure Low      Ankle Exercises: Seated   Other Seated Ankle Exercises 4 way left ankle exercise with red theraband 1 minute each direction (4 minutes total).                         PT Long Term Goals - 03/29/21 1414       PT LONG TERM GOAL #1   Time 6    Period Weeks    Status New      PT LONG TERM GOAL #2   Title Walk one hour with left foot pain not > 3/10.    Time 6    Period Weeks    Status New                   Plan - 04/26/21 1603     Clinical Impression Statement Patient is very pleased with her progress and today is reporting no  left heel and ankle pain.  Had patient walk up and down hall in clinic and to mentally focus on a normal gait pattern (heel strike, stance and toe off).  She was able to accomplish and and told her to practice breaking down the gait cycle until it becomes normal for her gain like it was pre-accident.    Personal Factors and Comorbidities Comorbidity 1;Comorbidity 2;Comorbidity 3+    Comorbidities OA, bilateral foot surgery to remove bone spurs, right RTC reapir, DDD, CTR on right, Fibromylagia, scoliosis.    Examination-Activity Limitations Other;Transfers    Examination-Participation Restrictions Other    Stability/Clinical Decision Making Stable/Uncomplicated    Rehab Potential Excellent    PT Frequency 2x / week    PT Duration 6 weeks    PT Treatment/Interventions ADLs/Self Care Home Management;Cryotherapy;Electrical Stimulation;Ultrasound;Moist Heat;Gait training;Stair training;Functional mobility training;Therapeutic activities;Therapeutic exercise;Manual techniques;Patient/family education;Passive range of motion;Vasopneumatic Device    PT Next Visit Plan Nustep, rockerboard, ankle isolator.    Consulted and Agree with Plan of Care Patient             Patient will benefit from skilled therapeutic intervention in order to improve the following deficits and impairments:  Abnormal gait, Decreased activity tolerance, Pain  Visit Diagnosis: Pain in left foot     Problem List Patient Active Problem List   Diagnosis Date Noted   Subacromial bursitis of right shoulder joint 11/24/2020   Trochanteric bursitis of right hip 11/24/2020   Pain of left lower leg 11/15/2020   Bilateral low back pain with right-sided sciatica 11/15/2020   Thrush 11/15/2020   Insomnia 11/19/2018   Intractable headache 07/18/2018   Occipital neuralgia 07/18/2018   Family history of cerebral aneurysm 07/18/2018   Numbness 04/23/2018   Cervical radiculopathy 04/23/2018   Encounter for long-term use of  opiate analgesic 04/22/2018   Lumbar degenerative disc disease 04/22/2018   Spondylosis of lumbar spine 04/22/2018   Fibromyalgia affecting multiple sites 04/22/2018   Constipation due to pain medication 04/30/2017   Depression 03/22/2017   Trochanteric bursitis of left hip 03/13/2017   Neck pain 11/13/2016   Left shoulder pain 11/13/2016   Chronic bilateral  low back pain 11/13/2016   History of colonic polyps 06/20/2016   IBS (irritable bowel syndrome) 06/20/2016   Epigastric pain 06/20/2016   Rhinitis, chronic 04/13/2016   Otalgia of both ears 04/13/2016   Arthralgia of right temporomandibular joint 04/13/2016   BMI 40.0-44.9, adult ([place]) 11/22/2015   Bilateral carpal tunnel syndrome 11/22/2015   Tobacco abuse 11/22/2015   Allergic rhinitis 11/01/2015   Gastroesophageal reflux disease 11/01/2015   LLQ pain 06/18/2015   Bloating 06/18/2015   Pain with urination 02/19/2015   Moody 01/22/2015   Pelvic pain in female 01/13/2015   Dysuria 01/13/2015   Hot flashes due to surgical menopause 01/13/2015   Pre-diabetes 01/13/2015   Hypertension 01/13/2015   Chronic migraine w/o aura, not intractable, w/o stat migr 01/13/2015   Psoriasis 01/13/2015   Arthritis 01/13/2015   Fibromyalgia 01/13/2015   COPD (chronic obstructive pulmonary disease) ([place]) 01/13/2015   Hyperlipidemia 01/13/2015   Abnormal nuclear stress test 01/07/2014   Abdominal pain 04/16/2013   Pain in joint involving multiple sites 02/20/2013   Head trauma 02/20/2013    Yeshaya Vath, [place] MPT 04/26/2021, 4:08 PM  [facility] [place], [place], [address] Phone: [phone]   Fax:  [phone]  Name: Judy Evans MRN: [id number] Date of Birth: 02-Jan-1971

## 2021-04-27 DIAGNOSIS — M79602 Pain in left arm: Secondary | ICD-10-CM | POA: Diagnosis not present

## 2021-04-27 DIAGNOSIS — M25642 Stiffness of left hand, not elsewhere classified: Secondary | ICD-10-CM | POA: Diagnosis not present

## 2021-04-27 DIAGNOSIS — S52502D Unspecified fracture of the lower end of left radius, subsequent encounter for closed fracture with routine healing: Secondary | ICD-10-CM | POA: Diagnosis not present

## 2021-04-27 DIAGNOSIS — S52572A Other intraarticular fracture of lower end of left radius, initial encounter for closed fracture: Secondary | ICD-10-CM | POA: Diagnosis not present

## 2021-04-27 DIAGNOSIS — G90512 Complex regional pain syndrome I of left upper limb: Secondary | ICD-10-CM | POA: Diagnosis not present

## 2021-04-27 DIAGNOSIS — M65331 Trigger finger, right middle finger: Secondary | ICD-10-CM | POA: Diagnosis not present

## 2021-04-27 DIAGNOSIS — M25632 Stiffness of left wrist, not elsewhere classified: Secondary | ICD-10-CM | POA: Diagnosis not present

## 2021-04-28 ENCOUNTER — Other Ambulatory Visit: Payer: Self-pay

## 2021-04-28 ENCOUNTER — Ambulatory Visit: Payer: Medicare HMO | Admitting: Physical Therapy

## 2021-04-28 DIAGNOSIS — M79672 Pain in left foot: Secondary | ICD-10-CM | POA: Diagnosis not present

## 2021-04-28 NOTE — Therapy (Signed)
[facility] [place], [place], [address] Phone: [phone]   Fax:  [phone]  Physical Therapy Treatment  Patient Details  Name: Judy Evans MRN: [id number] Date of Birth: May 21, 1971 Referring Provider (PT): Sanjuana Kava MD   Encounter Date: 04/28/2021   PT End of Session - 04/28/21 1520     Visit Number 9    Number of Visits 12    Date for PT Re-Evaluation 05/10/21    Authorization Type PROGRESS NOTE AT 10TH VISIT.  KX MODIFIER AFTER 15 VISITS.    PT Start Time 0145    PT Stop Time 0231    PT Time Calculation (min) 46 min    Activity Tolerance Patient tolerated treatment well             Past Medical History:  Diagnosis Date   Anxiety    Aphasia    Arthritis    COPD (chronic obstructive pulmonary disease) (HCC)    DDD (degenerative disc disease), cervical    DDD (degenerative disc disease), lumbar    Degenerative disc disease at L5-S1 level    Fibromyalgia    GERD (gastroesophageal reflux disease)    H. pylori infection    Hyperlipidemia    States she does not have   Hypertension    States she does not have   IBS (irritable bowel syndrome)    LLQ pain 06/18/2015   Migraines    Moody 01/22/2015   Pain with urination 02/19/2015   Psoriasis    Scoliosis    Spondylolysis     Past Surgical History:  Procedure Laterality Date   ABDOMINAL HYSTERECTOMY     BIOPSY  09/14/2016   Procedure: BIOPSY;  Surgeon: Daneil Dolin, MD;  [facility];  Service: Endoscopy;;  gastric    CARDIAC CATHETERIZATION  2015   patient reported it to be normal. "I was dying in pain during procedure".    CARPAL TUNNEL RELEASE Right    CESAREAN SECTION     CHOLECYSTECTOMY     COLONOSCOPY  06/2014   Altamese Dilling DeMason: normal   DILATION AND CURETTAGE OF UTERUS     ESOPHAGOGASTRODUODENOSCOPY  07/2014   Burke Keels: Normal   ESOPHAGOGASTRODUODENOSCOPY (EGD) WITH PROPOFOL N/A 09/14/2016   Dr. Gala Romney: Normal  esophagus status post empiric dilation, erosive gastropathy with biopsies showing chronic inactive gastritis, no H pylori.   FOOT SURGERY Bilateral    removal of heel spurs   MALONEY DILATION  09/14/2016   Procedure: MALONEY DILATION;  Surgeon: Daneil Dolin, MD;  [facility];  Service: Endoscopy;;   rotator cuff surgery Right    TONSILLECTOMY      There were no vitals filed for this visit.   Subjective Assessment - 04/28/21 1508     Subjective COVID-19 screen performed prior to patient entering clinic.  No new complaints.    Pertinent History OA, bilateral foot surgery to remove bone spurs, right RTC reapir, DDD, CTR on right, Fibromylagia, scoliosis.    How long can you walk comfortably? Up to an hour    Patient Stated Goals Walk normal without pain.    Currently in Pain? Yes    Pain Score 0-No pain                               [facility] Adult PT Treatment/Exercise - 04/28/21 0001       Exercises   Exercises Knee/Hip;Ankle  Knee/Hip Exercises: Aerobic   Nustep Level 2 to 4 over 15 minutes.      Vasopneumatic   Number Minutes Vasopneumatic  15 minutes    Vasopnuematic Location  --   Right ankle.   Vasopneumatic Pressure Low      Ankle Exercises: Standing   Rocker Board Limitations 3 minutes in parallel bars.      Ankle Exercises: Seated   Other Seated Ankle Exercises 4 way left ankle exercise with two minutes into dorsiflexion and 1 minutes each into plantarflexion, inversion and evrsion.                         PT Long Term Goals - 03/29/21 1414       PT LONG TERM GOAL #1   Time 6    Period Weeks    Status New      PT LONG TERM GOAL #2   Title Walk one hour with left foot pain not > 3/10.    Time 6    Period Weeks    Status New                   Plan - 04/28/21 1519     Clinical Impression Statement Patient walking today with a very good gait pattern and notable heel strike today. She did well  with ther ex and is very motivated to improve.    Personal Factors and Comorbidities Comorbidity 1;Comorbidity 2;Comorbidity 3+    Comorbidities OA, bilateral foot surgery to remove bone spurs, right RTC reapir, DDD, CTR on right, Fibromylagia, scoliosis.    Examination-Activity Limitations Other;Transfers    Stability/Clinical Decision Making Stable/Uncomplicated    Rehab Potential Excellent    PT Frequency 2x / week    PT Duration 6 weeks    PT Treatment/Interventions ADLs/Self Care Home Management;Cryotherapy;Electrical Stimulation;Ultrasound;Moist Heat;Gait training;Stair training;Functional mobility training;Therapeutic activities;Therapeutic exercise;Manual techniques;Patient/family education;Passive range of motion;Vasopneumatic Device    PT Next Visit Plan Nustep, rockerboard, ankle isolator.             Patient will benefit from skilled therapeutic intervention in order to improve the following deficits and impairments:  Abnormal gait, Decreased activity tolerance, Pain  Visit Diagnosis: Pain in left foot     Problem List Patient Active Problem List   Diagnosis Date Noted   Subacromial bursitis of right shoulder joint 11/24/2020   Trochanteric bursitis of right hip 11/24/2020   Pain of left lower leg 11/15/2020   Bilateral low back pain with right-sided sciatica 11/15/2020   Thrush 11/15/2020   Insomnia 11/19/2018   Intractable headache 07/18/2018   Occipital neuralgia 07/18/2018   Family history of cerebral aneurysm 07/18/2018   Numbness 04/23/2018   Cervical radiculopathy 04/23/2018   Encounter for long-term use of opiate analgesic 04/22/2018   Lumbar degenerative disc disease 04/22/2018   Spondylosis of lumbar spine 04/22/2018   Fibromyalgia affecting multiple sites 04/22/2018   Constipation due to pain medication 04/30/2017   Depression 03/22/2017   Trochanteric bursitis of left hip 03/13/2017   Neck pain 11/13/2016   Left shoulder pain 11/13/2016    Chronic bilateral low back pain 11/13/2016   History of colonic polyps 06/20/2016   IBS (irritable bowel syndrome) 06/20/2016   Epigastric pain 06/20/2016   Rhinitis, chronic 04/13/2016   Otalgia of both ears 04/13/2016   Arthralgia of right temporomandibular joint 04/13/2016   BMI 40.0-44.9, adult ([place]) 11/22/2015   Bilateral carpal tunnel syndrome 11/22/2015   Tobacco abuse 11/22/2015  Allergic rhinitis 11/01/2015   Gastroesophageal reflux disease 11/01/2015   LLQ pain 06/18/2015   Bloating 06/18/2015   Pain with urination 02/19/2015   Moody 01/22/2015   Pelvic pain in female 01/13/2015   Dysuria 01/13/2015   Hot flashes due to surgical menopause 01/13/2015   Pre-diabetes 01/13/2015   Hypertension 01/13/2015   Chronic migraine w/o aura, not intractable, w/o stat migr 01/13/2015   Psoriasis 01/13/2015   Arthritis 01/13/2015   Fibromyalgia 01/13/2015   COPD (chronic obstructive pulmonary disease) ([place]) 01/13/2015   Hyperlipidemia 01/13/2015   Abnormal nuclear stress test 01/07/2014   Abdominal pain 04/16/2013   Pain in joint involving multiple sites 02/20/2013   Head trauma 02/20/2013    Norinne Jeane, [place] MPT 04/28/2021, 3:23 PM  [facility] [place], [place], [address] Phone: [phone]   Fax:  [phone]  Name: ANALYSSA ROHRBAUGH MRN: [id number] Date of Birth: 1971-06-05

## 2021-04-29 DIAGNOSIS — M25642 Stiffness of left hand, not elsewhere classified: Secondary | ICD-10-CM | POA: Diagnosis not present

## 2021-04-29 DIAGNOSIS — G90512 Complex regional pain syndrome I of left upper limb: Secondary | ICD-10-CM | POA: Diagnosis not present

## 2021-04-29 DIAGNOSIS — M79602 Pain in left arm: Secondary | ICD-10-CM | POA: Diagnosis not present

## 2021-04-29 DIAGNOSIS — M25632 Stiffness of left wrist, not elsewhere classified: Secondary | ICD-10-CM | POA: Diagnosis not present

## 2021-04-29 DIAGNOSIS — S52502D Unspecified fracture of the lower end of left radius, subsequent encounter for closed fracture with routine healing: Secondary | ICD-10-CM | POA: Diagnosis not present

## 2021-05-03 ENCOUNTER — Other Ambulatory Visit: Payer: Self-pay

## 2021-05-03 ENCOUNTER — Ambulatory Visit: Payer: Medicare HMO | Admitting: Physical Therapy

## 2021-05-03 DIAGNOSIS — M79672 Pain in left foot: Secondary | ICD-10-CM

## 2021-05-03 NOTE — Therapy (Signed)
[facility] [place], [place], [address] Phone: [phone]   Fax:  [phone]  Physical Therapy Treatment  Patient Details  Name: Judy Evans MRN: [id number] Date of Birth: 07-30-1971 Referring Provider (PT): Sanjuana Kava MD   Encounter Date: 05/03/2021   PT End of Session - 05/03/21 1115     Visit Number 10    Number of Visits 12    Date for PT Re-Evaluation 05/10/21    Authorization Type PROGRESS NOTE AT 10TH VISIT.  KX MODIFIER AFTER 15 VISITS.    PT Start Time 1113    Activity Tolerance Patient tolerated treatment well    Behavior During Therapy WFL for tasks assessed/performed             Past Medical History:  Diagnosis Date   Anxiety    Aphasia    Arthritis    COPD (chronic obstructive pulmonary disease) (HCC)    DDD (degenerative disc disease), cervical    DDD (degenerative disc disease), lumbar    Degenerative disc disease at L5-S1 level    Fibromyalgia    GERD (gastroesophageal reflux disease)    H. pylori infection    Hyperlipidemia    States she does not have   Hypertension    States she does not have   IBS (irritable bowel syndrome)    LLQ pain 06/18/2015   Migraines    Moody 01/22/2015   Pain with urination 02/19/2015   Psoriasis    Scoliosis    Spondylolysis     Past Surgical History:  Procedure Laterality Date   ABDOMINAL HYSTERECTOMY     BIOPSY  09/14/2016   Procedure: BIOPSY;  Surgeon: Daneil Dolin, MD;  [facility];  Service: Endoscopy;;  gastric    CARDIAC CATHETERIZATION  2015   patient reported it to be normal. "I was dying in pain during procedure".    CARPAL TUNNEL RELEASE Right    CESAREAN SECTION     CHOLECYSTECTOMY     COLONOSCOPY  06/2014   Altamese Dilling DeMason: normal   DILATION AND CURETTAGE OF UTERUS     ESOPHAGOGASTRODUODENOSCOPY  07/2014   Burke Keels: Normal   ESOPHAGOGASTRODUODENOSCOPY (EGD) WITH PROPOFOL N/A 09/14/2016   Dr. Gala Romney: Normal  esophagus status post empiric dilation, erosive gastropathy with biopsies showing chronic inactive gastritis, no H pylori.   FOOT SURGERY Bilateral    removal of heel spurs   MALONEY DILATION  09/14/2016   Procedure: MALONEY DILATION;  Surgeon: Daneil Dolin, MD;  [facility];  Service: Endoscopy;;   rotator cuff surgery Right    TONSILLECTOMY      There were no vitals filed for this visit.   Subjective Assessment - 05/03/21 1116     Subjective COVID-19 screen performed prior to patient entering clinic.  Foot doing better overall.    Pertinent History OA, bilateral foot surgery to remove bone spurs, right RTC reapir, DDD, CTR on right, Fibromylagia, scoliosis.    Patient Stated Goals Walk normal without pain.    Currently in Pain? No/denies    Pain Score 0-No pain                               [facility] Adult PT Treatment/Exercise - 05/03/21 0001       Exercises   Exercises Knee/Hip      Knee/Hip Exercises: Aerobic   Nustep Level 4 x 10 minutes.  Modalities   Modalities Vasopneumatic      Vasopneumatic   Number Minutes Vasopneumatic  15 minutes    Vasopnuematic Location  --   Left foot/ankle.   Vasopneumatic Pressure Low      Ankle Exercises: Seated   Other Seated Ankle Exercises 4 way left ankle exercise with red theraband (4 minutes total).                    PT Education - 05/03/21 1121     Education Details Red theraband provided for 4-way ankle exercises.    Person(s) Educated Patient    Methods Explanation;Demonstration    Comprehension Verbalized understanding;Returned demonstration                 PT Long Term Goals - 05/03/21 1119       PT LONG TERM GOAL #1   Title Independent with a HEP.    Time 6    Period Weeks    Status Achieved      PT LONG TERM GOAL #2   Title Walk one hour with left foot pain not > 3/10.    Period Weeks    Status Partially Met                   Plan -  05/03/21 1144     Clinical Impression Statement Discharge today.  Patient has made very good progress with regards to her left foot rehab.  She reports no pain today and is walking much better with a near normal gait cycle.    Personal Factors and Comorbidities Comorbidity 1;Comorbidity 2;Comorbidity 3+    Comorbidities OA, bilateral foot surgery to remove bone spurs, right RTC reapir, DDD, CTR on right, Fibromylagia, scoliosis.    Examination-Activity Limitations Other;Transfers    Examination-Participation Restrictions Other    Stability/Clinical Decision Making Stable/Uncomplicated    Rehab Potential Excellent    PT Frequency 2x / week    PT Duration 6 weeks    PT Treatment/Interventions ADLs/Self Care Home Management;Cryotherapy;Electrical Stimulation;Ultrasound;Moist Heat;Gait training;Stair training;Functional mobility training;Therapeutic activities;Therapeutic exercise;Manual techniques;Patient/family education;Passive range of motion;Vasopneumatic Device    PT Next Visit Plan Discharge.    Consulted and Agree with Plan of Care Patient             Patient will benefit from skilled therapeutic intervention in order to improve the following deficits and impairments:  Abnormal gait, Decreased activity tolerance, Pain  Visit Diagnosis: Pain in left foot     Problem List Patient Active Problem List   Diagnosis Date Noted   Subacromial bursitis of right shoulder joint 11/24/2020   Trochanteric bursitis of right hip 11/24/2020   Pain of left lower leg 11/15/2020   Bilateral low back pain with right-sided sciatica 11/15/2020   Thrush 11/15/2020   Insomnia 11/19/2018   Intractable headache 07/18/2018   Occipital neuralgia 07/18/2018   Family history of cerebral aneurysm 07/18/2018   Numbness 04/23/2018   Cervical radiculopathy 04/23/2018   Encounter for long-term use of opiate analgesic 04/22/2018   Lumbar degenerative disc disease 04/22/2018   Spondylosis of lumbar spine  04/22/2018   Fibromyalgia affecting multiple sites 04/22/2018   Constipation due to pain medication 04/30/2017   Depression 03/22/2017   Trochanteric bursitis of left hip 03/13/2017   Neck pain 11/13/2016   Left shoulder pain 11/13/2016   Chronic bilateral low back pain 11/13/2016   History of colonic polyps 06/20/2016   IBS (irritable bowel syndrome) 06/20/2016   Epigastric pain 06/20/2016  Rhinitis, chronic 04/13/2016   Otalgia of both ears 04/13/2016   Arthralgia of right temporomandibular joint 04/13/2016   BMI 40.0-44.9, adult (Combine) 11/22/2015   Bilateral carpal tunnel syndrome 11/22/2015   Tobacco abuse 11/22/2015   Allergic rhinitis 11/01/2015   Gastroesophageal reflux disease 11/01/2015   LLQ pain 06/18/2015   Bloating 06/18/2015   Pain with urination 02/19/2015   Moody 01/22/2015   Pelvic pain in female 01/13/2015   Dysuria 01/13/2015   Hot flashes due to surgical menopause 01/13/2015   Pre-diabetes 01/13/2015   Hypertension 01/13/2015   Chronic migraine w/o aura, not intractable, w/o stat migr 01/13/2015   Psoriasis 01/13/2015   Arthritis 01/13/2015   Fibromyalgia 01/13/2015   COPD (chronic obstructive pulmonary disease) ([place]) 01/13/2015   Hyperlipidemia 01/13/2015   Abnormal nuclear stress test 01/07/2014   Abdominal pain 04/16/2013   Pain in joint involving multiple sites 02/20/2013   Head trauma 02/20/2013    Ailynn Gow, [place] MPT 05/03/2021, 11:49 AM  [facility] [address] [place], [place], [address] Phone: [phone]   Fax:  [phone]  Name: SHAYNNA HUSBY MRN: [id number] Date of Birth: 1971/05/06  PHYSICAL THERAPY DISCHARGE SUMMARY  Visits from Start of Care: 10.  Current functional level related to goals / functional outcomes: See above.   Remaining deficits: See below.   Education / Equipment: HEP.   Patient agrees to discharge. Patient goals were partially met. Patient is being  discharged due to being pleased with the current functional level.    Mali Leyla Soliz MPT

## 2021-05-05 ENCOUNTER — Encounter: Payer: Medicaid Other | Admitting: Physical Therapy

## 2021-05-05 DIAGNOSIS — M25512 Pain in left shoulder: Secondary | ICD-10-CM | POA: Diagnosis not present

## 2021-05-05 DIAGNOSIS — M19012 Primary osteoarthritis, left shoulder: Secondary | ICD-10-CM | POA: Diagnosis not present

## 2021-05-05 DIAGNOSIS — G8929 Other chronic pain: Secondary | ICD-10-CM | POA: Diagnosis not present

## 2021-05-06 DIAGNOSIS — M79602 Pain in left arm: Secondary | ICD-10-CM | POA: Diagnosis not present

## 2021-05-06 DIAGNOSIS — M25632 Stiffness of left wrist, not elsewhere classified: Secondary | ICD-10-CM | POA: Diagnosis not present

## 2021-05-06 DIAGNOSIS — G90512 Complex regional pain syndrome I of left upper limb: Secondary | ICD-10-CM | POA: Diagnosis not present

## 2021-05-06 DIAGNOSIS — M25642 Stiffness of left hand, not elsewhere classified: Secondary | ICD-10-CM | POA: Diagnosis not present

## 2021-05-06 DIAGNOSIS — S52502D Unspecified fracture of the lower end of left radius, subsequent encounter for closed fracture with routine healing: Secondary | ICD-10-CM | POA: Diagnosis not present

## 2021-05-11 ENCOUNTER — Other Ambulatory Visit: Payer: Self-pay | Admitting: Orthopedic Surgery

## 2021-05-11 DIAGNOSIS — X58XXXA Exposure to other specified factors, initial encounter: Secondary | ICD-10-CM | POA: Diagnosis not present

## 2021-05-11 DIAGNOSIS — M79602 Pain in left arm: Secondary | ICD-10-CM | POA: Diagnosis not present

## 2021-05-11 DIAGNOSIS — M24542 Contracture, left hand: Secondary | ICD-10-CM | POA: Diagnosis not present

## 2021-05-11 DIAGNOSIS — M25642 Stiffness of left hand, not elsewhere classified: Secondary | ICD-10-CM | POA: Diagnosis not present

## 2021-05-11 DIAGNOSIS — M65331 Trigger finger, right middle finger: Secondary | ICD-10-CM | POA: Diagnosis not present

## 2021-05-11 DIAGNOSIS — M21242 Flexion deformity, left finger joints: Secondary | ICD-10-CM | POA: Diagnosis not present

## 2021-05-11 DIAGNOSIS — S52272A Monteggia's fracture of left ulna, initial encounter for closed fracture: Secondary | ICD-10-CM | POA: Diagnosis not present

## 2021-05-11 DIAGNOSIS — G90512 Complex regional pain syndrome I of left upper limb: Secondary | ICD-10-CM | POA: Diagnosis not present

## 2021-05-11 DIAGNOSIS — M25512 Pain in left shoulder: Secondary | ICD-10-CM

## 2021-05-11 DIAGNOSIS — S52502D Unspecified fracture of the lower end of left radius, subsequent encounter for closed fracture with routine healing: Secondary | ICD-10-CM | POA: Diagnosis not present

## 2021-05-17 ENCOUNTER — Other Ambulatory Visit: Payer: Medicare HMO

## 2021-05-18 ENCOUNTER — Ambulatory Visit
Admission: RE | Admit: 2021-05-18 | Discharge: 2021-05-18 | Disposition: A | Discharge status: Home / Self Care | Payer: Medicare HMO | Source: Ambulatory Visit | Attending: Orthopedic Surgery | Admitting: Orthopedic Surgery

## 2021-05-18 DIAGNOSIS — M79602 Pain in left arm: Secondary | ICD-10-CM | POA: Diagnosis not present

## 2021-05-18 DIAGNOSIS — M25512 Pain in left shoulder: Secondary | ICD-10-CM

## 2021-05-18 DIAGNOSIS — S52502D Unspecified fracture of the lower end of left radius, subsequent encounter for closed fracture with routine healing: Secondary | ICD-10-CM | POA: Diagnosis not present

## 2021-05-18 DIAGNOSIS — M25642 Stiffness of left hand, not elsewhere classified: Secondary | ICD-10-CM | POA: Diagnosis not present

## 2021-05-18 DIAGNOSIS — M7552 Bursitis of left shoulder: Secondary | ICD-10-CM | POA: Diagnosis not present

## 2021-05-18 DIAGNOSIS — M19012 Primary osteoarthritis, left shoulder: Secondary | ICD-10-CM | POA: Diagnosis not present

## 2021-05-18 DIAGNOSIS — M25632 Stiffness of left wrist, not elsewhere classified: Secondary | ICD-10-CM | POA: Diagnosis not present

## 2021-05-18 DIAGNOSIS — G90512 Complex regional pain syndrome I of left upper limb: Secondary | ICD-10-CM | POA: Diagnosis not present

## 2021-05-22 DIAGNOSIS — J449 Chronic obstructive pulmonary disease, unspecified: Secondary | ICD-10-CM | POA: Diagnosis not present

## 2021-05-25 DIAGNOSIS — M25642 Stiffness of left hand, not elsewhere classified: Secondary | ICD-10-CM | POA: Diagnosis not present

## 2021-05-25 DIAGNOSIS — S63408A Traumatic rupture of unspecified ligament of other finger at metacarpophalangeal and interphalangeal joint, initial encounter: Secondary | ICD-10-CM | POA: Diagnosis not present

## 2021-05-26 DIAGNOSIS — E119 Type 2 diabetes mellitus without complications: Secondary | ICD-10-CM | POA: Diagnosis not present

## 2021-05-26 DIAGNOSIS — M797 Fibromyalgia: Secondary | ICD-10-CM | POA: Diagnosis not present

## 2021-05-26 DIAGNOSIS — M7542 Impingement syndrome of left shoulder: Secondary | ICD-10-CM | POA: Diagnosis not present

## 2021-05-26 DIAGNOSIS — Z9889 Other specified postprocedural states: Secondary | ICD-10-CM | POA: Diagnosis not present

## 2021-05-26 DIAGNOSIS — M19012 Primary osteoarthritis, left shoulder: Secondary | ICD-10-CM | POA: Diagnosis not present

## 2021-05-26 DIAGNOSIS — M898X1 Other specified disorders of bone, shoulder: Secondary | ICD-10-CM | POA: Diagnosis not present

## 2021-05-31 DIAGNOSIS — S63408A Traumatic rupture of unspecified ligament of other finger at metacarpophalangeal and interphalangeal joint, initial encounter: Secondary | ICD-10-CM | POA: Diagnosis not present

## 2021-05-31 DIAGNOSIS — L409 Psoriasis, unspecified: Secondary | ICD-10-CM | POA: Diagnosis not present

## 2021-05-31 DIAGNOSIS — M25642 Stiffness of left hand, not elsewhere classified: Secondary | ICD-10-CM | POA: Diagnosis not present

## 2021-05-31 DIAGNOSIS — Z79899 Other long term (current) drug therapy: Secondary | ICD-10-CM | POA: Diagnosis not present

## 2021-06-01 ENCOUNTER — Ambulatory Visit (INDEPENDENT_AMBULATORY_CARE_PROVIDER_SITE_OTHER): Payer: Medicare HMO | Admitting: Neurology

## 2021-06-01 ENCOUNTER — Encounter: Payer: Self-pay | Admitting: Neurology

## 2021-06-01 ENCOUNTER — Other Ambulatory Visit: Payer: Self-pay

## 2021-06-01 VITALS — BP 131/77 | HR 83 | Ht 62.0 in | Wt 198.0 lb

## 2021-06-01 DIAGNOSIS — M542 Cervicalgia: Secondary | ICD-10-CM

## 2021-06-01 DIAGNOSIS — M5412 Radiculopathy, cervical region: Secondary | ICD-10-CM | POA: Diagnosis not present

## 2021-06-01 DIAGNOSIS — M5481 Occipital neuralgia: Secondary | ICD-10-CM | POA: Diagnosis not present

## 2021-06-01 DIAGNOSIS — M797 Fibromyalgia: Secondary | ICD-10-CM | POA: Diagnosis not present
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
## 2021-06-02 DIAGNOSIS — Z8739 Personal history of other diseases of the musculoskeletal system and connective tissue: Secondary | ICD-10-CM | POA: Diagnosis not present

## 2021-06-02 DIAGNOSIS — M5030 Other cervical disc degeneration, unspecified cervical region: Secondary | ICD-10-CM | POA: Diagnosis not present

## 2021-06-02 DIAGNOSIS — G90512 Complex regional pain syndrome I of left upper limb: Secondary | ICD-10-CM | POA: Diagnosis not present

## 2021-06-06 DIAGNOSIS — M25642 Stiffness of left hand, not elsewhere classified: Secondary | ICD-10-CM | POA: Diagnosis not present

## 2021-06-06 DIAGNOSIS — S63408A Traumatic rupture of unspecified ligament of other finger at metacarpophalangeal and interphalangeal joint, initial encounter: Secondary | ICD-10-CM | POA: Diagnosis not present

## 2021-06-09 DIAGNOSIS — M47812 Spondylosis without myelopathy or radiculopathy, cervical region: Secondary | ICD-10-CM | POA: Diagnosis not present

## 2021-06-09 DIAGNOSIS — M5030 Other cervical disc degeneration, unspecified cervical region: Secondary | ICD-10-CM | POA: Diagnosis not present

## 2021-06-09 DIAGNOSIS — M25442 Effusion, left hand: Secondary | ICD-10-CM | POA: Diagnosis not present

## 2021-06-10 DIAGNOSIS — S63408A Traumatic rupture of unspecified ligament of other finger at metacarpophalangeal and interphalangeal joint, initial encounter: Secondary | ICD-10-CM | POA: Diagnosis not present

## 2021-06-10 DIAGNOSIS — G90512 Complex regional pain syndrome I of left upper limb: Secondary | ICD-10-CM | POA: Diagnosis not present

## 2021-06-10 DIAGNOSIS — M25642 Stiffness of left hand, not elsewhere classified: Secondary | ICD-10-CM | POA: Diagnosis not present

## 2021-06-14 DIAGNOSIS — M25642 Stiffness of left hand, not elsewhere classified: Secondary | ICD-10-CM | POA: Diagnosis not present

## 2021-06-14 DIAGNOSIS — S63408A Traumatic rupture of unspecified ligament of other finger at metacarpophalangeal and interphalangeal joint, initial encounter: Secondary | ICD-10-CM | POA: Diagnosis not present

## 2021-06-14 DIAGNOSIS — S52502D Unspecified fracture of the lower end of left radius, subsequent encounter for closed fracture with routine healing: Secondary | ICD-10-CM | POA: Diagnosis not present

## 2021-06-14 DIAGNOSIS — G90512 Complex regional pain syndrome I of left upper limb: Secondary | ICD-10-CM | POA: Diagnosis not present

## 2021-06-17 DIAGNOSIS — M25642 Stiffness of left hand, not elsewhere classified: Secondary | ICD-10-CM | POA: Diagnosis not present

## 2021-06-17 DIAGNOSIS — S63408A Traumatic rupture of unspecified ligament of other finger at metacarpophalangeal and interphalangeal joint, initial encounter: Secondary | ICD-10-CM | POA: Diagnosis not present

## 2021-06-17 DIAGNOSIS — G90512 Complex regional pain syndrome I of left upper limb: Secondary | ICD-10-CM | POA: Diagnosis not present

## 2021-06-20 ENCOUNTER — Telehealth: Payer: Self-pay | Admitting: *Deleted

## 2021-06-20 DIAGNOSIS — G90512 Complex regional pain syndrome I of left upper limb: Secondary | ICD-10-CM | POA: Diagnosis not present

## 2021-06-20 DIAGNOSIS — M5412 Radiculopathy, cervical region: Secondary | ICD-10-CM

## 2021-06-20 DIAGNOSIS — G8929 Other chronic pain: Secondary | ICD-10-CM

## 2021-06-20 DIAGNOSIS — M79662 Pain in left lower leg: Secondary | ICD-10-CM

## 2021-06-20 DIAGNOSIS — S52502D Unspecified fracture of the lower end of left radius, subsequent encounter for closed fracture with routine healing: Secondary | ICD-10-CM | POA: Diagnosis not present

## 2021-06-20 DIAGNOSIS — S63408A Traumatic rupture of unspecified ligament of other finger at metacarpophalangeal and interphalangeal joint, initial encounter: Secondary | ICD-10-CM | POA: Diagnosis not present

## 2021-06-20 DIAGNOSIS — M24542 Contracture, left hand: Secondary | ICD-10-CM | POA: Diagnosis not present

## 2021-06-20 NOTE — Telephone Encounter (Signed)
Denied today  Your request has been denied

## 2021-06-20 NOTE — Telephone Encounter (Signed)
Called patient, no answer, left message to return call 

## 2021-06-20 NOTE — Telephone Encounter (Signed)
Lidocaine 5% patches PA started   Key: [id number]  Sent to plan

## 2021-06-21 DIAGNOSIS — M24542 Contracture, left hand: Secondary | ICD-10-CM | POA: Diagnosis not present

## 2021-06-21 DIAGNOSIS — S63408A Traumatic rupture of unspecified ligament of other finger at metacarpophalangeal and interphalangeal joint, initial encounter: Secondary | ICD-10-CM | POA: Diagnosis not present

## 2021-06-22 DIAGNOSIS — J449 Chronic obstructive pulmonary disease, unspecified: Secondary | ICD-10-CM | POA: Diagnosis not present

## 2021-06-28 DIAGNOSIS — M24542 Contracture, left hand: Secondary | ICD-10-CM | POA: Diagnosis not present

## 2021-06-28 DIAGNOSIS — S52502D Unspecified fracture of the lower end of left radius, subsequent encounter for closed fracture with routine healing: Secondary | ICD-10-CM | POA: Diagnosis not present

## 2021-06-28 DIAGNOSIS — G90512 Complex regional pain syndrome I of left upper limb: Secondary | ICD-10-CM | POA: Diagnosis not present

## 2021-06-28 DIAGNOSIS — S63408A Traumatic rupture of unspecified ligament of other finger at metacarpophalangeal and interphalangeal joint, initial encounter: Secondary | ICD-10-CM | POA: Diagnosis not present

## 2021-07-01 DIAGNOSIS — S63408A Traumatic rupture of unspecified ligament of other finger at metacarpophalangeal and interphalangeal joint, initial encounter: Secondary | ICD-10-CM | POA: Diagnosis not present

## 2021-07-01 DIAGNOSIS — S52502D Unspecified fracture of the lower end of left radius, subsequent encounter for closed fracture with routine healing: Secondary | ICD-10-CM | POA: Diagnosis not present

## 2021-07-01 DIAGNOSIS — G90512 Complex regional pain syndrome I of left upper limb: Secondary | ICD-10-CM | POA: Diagnosis not present

## 2021-07-01 DIAGNOSIS — M79602 Pain in left arm: Secondary | ICD-10-CM | POA: Diagnosis not present

## 2021-07-01 DIAGNOSIS — M25632 Stiffness of left wrist, not elsewhere classified: Secondary | ICD-10-CM | POA: Diagnosis not present

## 2021-07-05 DIAGNOSIS — S52502D Unspecified fracture of the lower end of left radius, subsequent encounter for closed fracture with routine healing: Secondary | ICD-10-CM | POA: Diagnosis not present

## 2021-07-05 DIAGNOSIS — M25632 Stiffness of left wrist, not elsewhere classified: Secondary | ICD-10-CM | POA: Diagnosis not present

## 2021-07-05 DIAGNOSIS — S63408A Traumatic rupture of unspecified ligament of other finger at metacarpophalangeal and interphalangeal joint, initial encounter: Secondary | ICD-10-CM | POA: Diagnosis not present

## 2021-07-05 DIAGNOSIS — G90512 Complex regional pain syndrome I of left upper limb: Secondary | ICD-10-CM | POA: Diagnosis not present

## 2021-07-05 DIAGNOSIS — M25642 Stiffness of left hand, not elsewhere classified: Secondary | ICD-10-CM | POA: Diagnosis not present

## 2021-07-05 DIAGNOSIS — M79602 Pain in left arm: Secondary | ICD-10-CM | POA: Diagnosis not present

## 2021-07-08 DIAGNOSIS — M25632 Stiffness of left wrist, not elsewhere classified: Secondary | ICD-10-CM | POA: Diagnosis not present

## 2021-07-08 DIAGNOSIS — G90512 Complex regional pain syndrome I of left upper limb: Secondary | ICD-10-CM | POA: Diagnosis not present

## 2021-07-08 DIAGNOSIS — M79602 Pain in left arm: Secondary | ICD-10-CM | POA: Diagnosis not present

## 2021-07-08 DIAGNOSIS — S63408A Traumatic rupture of unspecified ligament of other finger at metacarpophalangeal and interphalangeal joint, initial encounter: Secondary | ICD-10-CM | POA: Diagnosis not present

## 2021-07-08 DIAGNOSIS — S52502D Unspecified fracture of the lower end of left radius, subsequent encounter for closed fracture with routine healing: Secondary | ICD-10-CM | POA: Diagnosis not present

## 2021-07-11 DIAGNOSIS — G90512 Complex regional pain syndrome I of left upper limb: Secondary | ICD-10-CM | POA: Diagnosis not present

## 2021-07-11 DIAGNOSIS — M25512 Pain in left shoulder: Secondary | ICD-10-CM | POA: Diagnosis not present

## 2021-07-12 DIAGNOSIS — S63408A Traumatic rupture of unspecified ligament of other finger at metacarpophalangeal and interphalangeal joint, initial encounter: Secondary | ICD-10-CM | POA: Diagnosis not present

## 2021-07-14 DIAGNOSIS — M24542 Contracture, left hand: Secondary | ICD-10-CM | POA: Diagnosis not present

## 2021-07-16 ENCOUNTER — Other Ambulatory Visit: Payer: Self-pay | Admitting: Nurse Practitioner

## 2021-07-16 DIAGNOSIS — J449 Chronic obstructive pulmonary disease, unspecified: Secondary | ICD-10-CM

## 2021-07-19 DIAGNOSIS — S63408A Traumatic rupture of unspecified ligament of other finger at metacarpophalangeal and interphalangeal joint, initial encounter: Secondary | ICD-10-CM | POA: Diagnosis not present

## 2021-07-19 DIAGNOSIS — M25642 Stiffness of left hand, not elsewhere classified: Secondary | ICD-10-CM | POA: Diagnosis not present

## 2021-07-19 DIAGNOSIS — M65331 Trigger finger, right middle finger: Secondary | ICD-10-CM | POA: Diagnosis not present

## 2021-07-19 DIAGNOSIS — M79602 Pain in left arm: Secondary | ICD-10-CM | POA: Diagnosis not present

## 2021-07-22 DIAGNOSIS — J449 Chronic obstructive pulmonary disease, unspecified: Secondary | ICD-10-CM | POA: Diagnosis not present

## 2021-07-27 DIAGNOSIS — S63408A Traumatic rupture of unspecified ligament of other finger at metacarpophalangeal and interphalangeal joint, initial encounter: Secondary | ICD-10-CM | POA: Diagnosis not present

## 2021-07-27 DIAGNOSIS — M79602 Pain in left arm: Secondary | ICD-10-CM | POA: Diagnosis not present

## 2021-07-27 DIAGNOSIS — M25642 Stiffness of left hand, not elsewhere classified: Secondary | ICD-10-CM | POA: Diagnosis not present

## 2021-08-02 DIAGNOSIS — M79602 Pain in left arm: Secondary | ICD-10-CM | POA: Diagnosis not present

## 2021-08-02 DIAGNOSIS — M25642 Stiffness of left hand, not elsewhere classified: Secondary | ICD-10-CM | POA: Diagnosis not present

## 2021-08-02 DIAGNOSIS — S63408A Traumatic rupture of unspecified ligament of other finger at metacarpophalangeal and interphalangeal joint, initial encounter: Secondary | ICD-10-CM | POA: Diagnosis not present

## 2021-08-02 DIAGNOSIS — G90512 Complex regional pain syndrome I of left upper limb: Secondary | ICD-10-CM | POA: Diagnosis not present

## 2021-08-04 DIAGNOSIS — M25642 Stiffness of left hand, not elsewhere classified: Secondary | ICD-10-CM | POA: Diagnosis not present

## 2021-08-04 DIAGNOSIS — M79602 Pain in left arm: Secondary | ICD-10-CM | POA: Diagnosis not present

## 2021-08-06 ENCOUNTER — Other Ambulatory Visit: Payer: Self-pay | Admitting: Nurse Practitioner

## 2021-08-06 DIAGNOSIS — J449 Chronic obstructive pulmonary disease, unspecified: Secondary | ICD-10-CM

## 2021-08-08 ENCOUNTER — Encounter: Payer: Self-pay | Admitting: Nurse Practitioner

## 2021-08-08 NOTE — Telephone Encounter (Signed)
Je NTBS 30 days given 07/18/21

## 2021-08-08 NOTE — Telephone Encounter (Signed)
No voicemail / letter mailed

## 2021-08-11 DIAGNOSIS — S63408A Traumatic rupture of unspecified ligament of other finger at metacarpophalangeal and interphalangeal joint, initial encounter: Secondary | ICD-10-CM | POA: Diagnosis not present

## 2021-08-11 DIAGNOSIS — M25642 Stiffness of left hand, not elsewhere classified: Secondary | ICD-10-CM | POA: Diagnosis not present

## 2021-08-17 DIAGNOSIS — M25642 Stiffness of left hand, not elsewhere classified: Secondary | ICD-10-CM | POA: Diagnosis not present

## 2021-08-19 DIAGNOSIS — M24542 Contracture, left hand: Secondary | ICD-10-CM | POA: Diagnosis not present

## 2021-08-19 DIAGNOSIS — S63408A Traumatic rupture of unspecified ligament of other finger at metacarpophalangeal and interphalangeal joint, initial encounter: Secondary | ICD-10-CM | POA: Diagnosis not present

## 2021-08-19 DIAGNOSIS — M79602 Pain in left arm: Secondary | ICD-10-CM | POA: Diagnosis not present

## 2021-08-22 DIAGNOSIS — J449 Chronic obstructive pulmonary disease, unspecified: Secondary | ICD-10-CM | POA: Diagnosis not present

## 2021-08-23 DIAGNOSIS — M25642 Stiffness of left hand, not elsewhere classified: Secondary | ICD-10-CM | POA: Diagnosis not present

## 2021-08-23 DIAGNOSIS — S63408A Traumatic rupture of unspecified ligament of other finger at metacarpophalangeal and interphalangeal joint, initial encounter: Secondary | ICD-10-CM | POA: Diagnosis not present

## 2021-08-26 DIAGNOSIS — M25642 Stiffness of left hand, not elsewhere classified: Secondary | ICD-10-CM | POA: Diagnosis not present

## 2021-08-26 DIAGNOSIS — S63408A Traumatic rupture of unspecified ligament of other finger at metacarpophalangeal and interphalangeal joint, initial encounter: Secondary | ICD-10-CM | POA: Diagnosis not present

## 2021-08-30 DIAGNOSIS — M25642 Stiffness of left hand, not elsewhere classified: Secondary | ICD-10-CM | POA: Diagnosis not present

## 2021-08-30 DIAGNOSIS — G90512 Complex regional pain syndrome I of left upper limb: Secondary | ICD-10-CM | POA: Diagnosis not present

## 2021-08-30 DIAGNOSIS — S52502D Unspecified fracture of the lower end of left radius, subsequent encounter for closed fracture with routine healing: Secondary | ICD-10-CM | POA: Diagnosis not present

## 2021-09-02 DIAGNOSIS — M25642 Stiffness of left hand, not elsewhere classified: Secondary | ICD-10-CM | POA: Diagnosis not present

## 2021-09-02 DIAGNOSIS — G90512 Complex regional pain syndrome I of left upper limb: Secondary | ICD-10-CM | POA: Diagnosis not present

## 2021-09-02 DIAGNOSIS — S63408A Traumatic rupture of unspecified ligament of other finger at metacarpophalangeal and interphalangeal joint, initial encounter: Secondary | ICD-10-CM | POA: Diagnosis not present

## 2021-09-02 DIAGNOSIS — M79602 Pain in left arm: Secondary | ICD-10-CM | POA: Diagnosis not present

## 2021-09-06 DIAGNOSIS — G90512 Complex regional pain syndrome I of left upper limb: Secondary | ICD-10-CM | POA: Diagnosis not present

## 2021-09-06 DIAGNOSIS — M25632 Stiffness of left wrist, not elsewhere classified: Secondary | ICD-10-CM | POA: Diagnosis not present

## 2021-09-06 DIAGNOSIS — M25642 Stiffness of left hand, not elsewhere classified: Secondary | ICD-10-CM | POA: Diagnosis not present

## 2021-09-06 DIAGNOSIS — S63408A Traumatic rupture of unspecified ligament of other finger at metacarpophalangeal and interphalangeal joint, initial encounter: Secondary | ICD-10-CM | POA: Diagnosis not present

## 2021-09-08 DIAGNOSIS — M24542 Contracture, left hand: Secondary | ICD-10-CM | POA: Diagnosis not present

## 2021-09-08 DIAGNOSIS — M79602 Pain in left arm: Secondary | ICD-10-CM | POA: Diagnosis not present

## 2021-09-08 DIAGNOSIS — G90512 Complex regional pain syndrome I of left upper limb: Secondary | ICD-10-CM | POA: Diagnosis not present

## 2021-09-08 DIAGNOSIS — S52502D Unspecified fracture of the lower end of left radius, subsequent encounter for closed fracture with routine healing: Secondary | ICD-10-CM | POA: Diagnosis not present

## 2021-09-09 ENCOUNTER — Ambulatory Visit (INDEPENDENT_AMBULATORY_CARE_PROVIDER_SITE_OTHER): Payer: Medicare HMO | Admitting: Family Medicine

## 2021-09-09 ENCOUNTER — Other Ambulatory Visit: Payer: Self-pay

## 2021-09-09 ENCOUNTER — Encounter: Payer: Self-pay | Admitting: Family Medicine

## 2021-09-09 VITALS — BP 114/75 | HR 84 | Ht 62.0 in | Wt 193.0 lb

## 2021-09-09 DIAGNOSIS — B372 Candidiasis of skin and nail: Secondary | ICD-10-CM | POA: Diagnosis not present

## 2021-09-09 DIAGNOSIS — M5441 Lumbago with sciatica, right side: Secondary | ICD-10-CM

## 2021-09-09 DIAGNOSIS — J449 Chronic obstructive pulmonary disease, unspecified: Secondary | ICD-10-CM | POA: Diagnosis not present

## 2021-09-09 DIAGNOSIS — G8929 Other chronic pain: Secondary | ICD-10-CM

## 2021-09-09 DIAGNOSIS — K219 Gastro-esophageal reflux disease without esophagitis: Secondary | ICD-10-CM | POA: Diagnosis not present

## 2021-09-09 DIAGNOSIS — L02416 Cutaneous abscess of left lower limb: Secondary | ICD-10-CM

## 2021-09-09 MED ORDER — NYSTATIN 100000 UNIT/GM EX OINT: 1.0000 "application " | TOPICAL_OINTMENT | Freq: Two times a day (BID) | CUTANEOUS | 0 refills | Status: AC | PRN

## 2021-09-09 MED ORDER — LIDOCAINE 5 % EX PTCH: 1.0000 | MEDICATED_PATCH | CUTANEOUS | 2 refills | Status: DC

## 2021-09-09 MED ORDER — OMEPRAZOLE 40 MG PO CPDR: DELAYED_RELEASE_CAPSULE | ORAL | 0 refills | Status: DC

## 2021-09-09 MED ORDER — IPRATROPIUM-ALBUTEROL 0.5-2.5 (3) MG/3ML IN SOLN: 3.0000 mL | Freq: Four times a day (QID) | RESPIRATORY_TRACT | 0 refills | Status: DC | PRN

## 2021-09-09 MED ORDER — SULFAMETHOXAZOLE-TRIMETHOPRIM 800-160 MG PO TABS: 1.0000 | ORAL_TABLET | Freq: Two times a day (BID) | ORAL | 0 refills | Status: AC

## 2021-09-09 MED ORDER — ALBUTEROL SULFATE HFA 108 (90 BASE) MCG/ACT IN AERS: INHALATION_SPRAY | RESPIRATORY_TRACT | 2 refills | Status: DC

## 2021-09-09 NOTE — Progress Notes (Signed)
Assessment & Plan:  1. Abscess of left leg Education provided on abscesses. Encouraged use of normal saline or wound cleanser spray instead of hydrogen peroxide. Change dressing twice daily.  - sulfamethoxazole-trimethoprim (BACTRIM DS) 800-160 MG tablet; Take 1 tablet by mouth 2 (two) times daily for 7 days.  Dispense: 14 tablet; Refill: 0  2. Candidal intertrigo - nystatin ointment (MYCOSTATIN); Apply 1 application topically 2 (two) times daily as needed.  Dispense: 30 g; Refill: 0   Follow up plan: Return PCP, for follow-up of chronic medication conditions.  Hendricks Limes, MSN, APRN, FNP-C [facility]  Subjective:   Patient ID: Judy Evans, female    DOB: [birth year], 50 y.o.   MRN: [id number]  HPI: Judy Evans is a 50 y.o. female presenting on 09/09/2021 for Recurrent Skin Infections (Boils, concern for MRSA)  Patient presents for evaluation of a cutaneous abscess. Lesion is located on the back side of her left leg. Onset was 2 weeks ago. Symptoms have gradually improved. Abscess has associated symptoms of spontaneous drainage, pain. Patient does have previous history of cutaneous abscesses. Patient does not have diabetes. She has been applying warm compresses and cleansing with hydrogen peroxide. Patient reports history of MRSA.  Patient also reports redness and irritation under her left breast. She has used Nystatin for this in the past, but does not currently have any.   ROS: Negative unless specifically indicated above in HPI.   Relevant past medical history reviewed and updated as indicated.   Allergies and medications reviewed and updated.   Current Outpatient Medications:    AJOVY 225 MG/1.5ML SOSY, INJECT 1 SYRINGE UNDER THE SKIN EVERY 28 DAYS., Disp: 4.5 mL, Rfl: 3   amitriptyline (ELAVIL) 25 MG tablet, TAKE 1 TO 2 TABLETS BY MOUTH AT BEDTIME, Disp: 180 tablet, Rfl: 1   celecoxib (CELEBREX) 200 MG capsule, Take by mouth., Disp: ,  Rfl:    cetirizine (ZYRTEC) 10 MG tablet, Take 10 mg by mouth daily., Disp: , Rfl:    COSENTYX SENSOREADY, 300 MG, 150 MG/ML SOAJ, , Disp: , Rfl:    cyclobenzaprine (FLEXERIL) 10 MG tablet, Take 1 tablet (10 mg total) by mouth 2 (two) times daily as needed for muscle spasms., Disp: 25 tablet, Rfl: 0   fluticasone (FLONASE) 50 MCG/ACT nasal spray, SPRAY 2 SPRAYS INTO EACH NOSTRIL EVERY DAY, Disp: 48 mL, Rfl: 1   gabapentin (NEURONTIN) 800 MG tablet, Take 1 tablet (800 mg total) by mouth 4 (four) times daily. (Needs to be seen before next refill), Disp: 120 tablet, Rfl: 2   meclizine (ANTIVERT) 25 MG tablet, TAKE 1 TABLET (25 MG TOTAL) BY MOUTH 3 (THREE) TIMES DAILY AS NEEDED FOR DIZZINESS., Disp: 90 tablet, Rfl: 1   triamcinolone cream (KENALOG) 0.1 %, , Disp: , Rfl:    triamcinolone ointment (KENALOG) 0.5 %, Apply topically 2 (two) times daily., Disp: , Rfl:    albuterol (VENTOLIN HFA) 108 (90 Base) MCG/ACT inhaler, USE 2 PUFFS BY MOUTH EVERY 4 HOURS AS NEEDED FOR WHEEZING OR SHORTNESS OF BREATH, Disp: 18 each, Rfl: 2   ipratropium-albuterol (DUONEB) 0.5-2.5 (3) MG/3ML SOLN, Take 3 mLs by nebulization every 6 (six) hours as needed. (NEEDS TO BE SEEN BEFORE NEXT REFILL), Disp: 120 mL, Rfl: 0   lidocaine (LIDODERM) 5 %, Place 1 patch onto the skin daily. Remove & Discard patch within 12 hours or as directed by MD, Disp: 30 patch, Rfl: 2   nystatin ointment (MYCOSTATIN), Apply 1 application topically 2 (  two) times daily as needed., Disp: 30 g, Rfl: 0   omeprazole (PRILOSEC) 40 MG capsule, 1 capsule by mouth daily, Disp: 90 capsule, Rfl: 0  Allergies  Allergen Reactions   Rosuvastatin Calcium Swelling    Ends up in the hospital   Statins Swelling    Ends up in the hospital   Wellbutrin [Bupropion]     suicidal   Hctz [Hydrochlorothiazide] Nausea And Vomiting and Rash   Hydrocodone-Acetaminophen Rash   Oxycodone Rash   Penicillins Nausea And Vomiting and Rash    Has patient had a PCN reaction  causing immediate rash, facial/tongue/throat swelling, SOB or lightheadedness with hypotension: Yes Has patient had a PCN reaction causing severe rash involving mucus membranes or skin necrosis: No Has patient had a PCN reaction that required hospitalization No Has patient had a PCN reaction occurring within the last 10 years: No If all of the above answers are "NO", then may proceed with Cephalosporin use.     Objective:   BP 114/75    Pulse 84    Ht 5\' 2"  (1.575 m)    Wt 193 lb (87.5 kg)    SpO2 95%    BMI 35.30 kg/m    Physical Exam Vitals reviewed.  Constitutional:      General: She is not in acute distress.    Appearance: Normal appearance. She is obese. She is not ill-appearing, toxic-appearing or diaphoretic.  HENT:     Head: Normocephalic and atraumatic.  Eyes:     General: No scleral icterus.       Right eye: No discharge.        Left eye: No discharge.     Conjunctiva/sclera: Conjunctivae normal.  Cardiovascular:     Rate and Rhythm: Normal rate.  Pulmonary:     Effort: Pulmonary effort is normal. No respiratory distress.  Musculoskeletal:        General: Normal range of motion.     Cervical back: Normal range of motion.  Skin:    General: Skin is warm and dry.     Capillary Refill: Capillary refill takes less than 2 seconds.     Findings: Abscess (back of left leg, draining purulent drainage) and erythema (under left breast) present.  Neurological:     General: No focal deficit present.     Mental Status: She is alert and oriented to person, place, and time. Mental status is at baseline.  Psychiatric:        Mood and Affect: Mood normal.        Behavior: Behavior normal.        Thought Content: Thought content normal.        Judgment: Judgment normal.

## 2021-09-12 DIAGNOSIS — M25642 Stiffness of left hand, not elsewhere classified: Secondary | ICD-10-CM | POA: Diagnosis not present

## 2021-09-12 DIAGNOSIS — G90512 Complex regional pain syndrome I of left upper limb: Secondary | ICD-10-CM | POA: Diagnosis not present

## 2021-09-13 DIAGNOSIS — S63408A Traumatic rupture of unspecified ligament of other finger at metacarpophalangeal and interphalangeal joint, initial encounter: Secondary | ICD-10-CM | POA: Diagnosis not present

## 2021-09-13 DIAGNOSIS — M65331 Trigger finger, right middle finger: Secondary | ICD-10-CM | POA: Diagnosis not present

## 2021-09-13 DIAGNOSIS — M24542 Contracture, left hand: Secondary | ICD-10-CM | POA: Diagnosis not present

## 2021-09-17 ENCOUNTER — Other Ambulatory Visit: Payer: Self-pay | Admitting: Neurology

## 2021-09-20 ENCOUNTER — Telehealth: Payer: Self-pay | Admitting: *Deleted

## 2021-09-20 DIAGNOSIS — M25642 Stiffness of left hand, not elsewhere classified: Secondary | ICD-10-CM | POA: Diagnosis not present

## 2021-09-20 NOTE — Telephone Encounter (Signed)
Judy Evans [facility] - PA Case ID: [id number] - Rx #: [id number] Need help? Call us at [phone] Status Sent to [facility] Lidocaine 5% patches Form [employer] Electronic PA Form [phone] NCPDP)

## 2021-09-21 DIAGNOSIS — J449 Chronic obstructive pulmonary disease, unspecified: Secondary | ICD-10-CM | POA: Diagnosis not present

## 2021-09-21 NOTE — Telephone Encounter (Signed)
Denied on December 27 Your request has been denied

## 2021-09-27 ENCOUNTER — Encounter: Payer: Medicare HMO | Admitting: Nurse Practitioner

## 2021-09-29 ENCOUNTER — Telehealth: Payer: Self-pay | Admitting: *Deleted

## 2021-09-29 NOTE — Telephone Encounter (Signed)
Submitted PA Ajovy on CMM. Key: [id number]. Waiting on determination from [place] [employer] Part D.

## 2021-10-03 NOTE — Telephone Encounter (Signed)
PA approved  09/25/2021 - 09/24/2022.  

## 2021-10-04 DIAGNOSIS — M25642 Stiffness of left hand, not elsewhere classified: Secondary | ICD-10-CM | POA: Diagnosis not present

## 2021-10-04 DIAGNOSIS — S63408A Traumatic rupture of unspecified ligament of other finger at metacarpophalangeal and interphalangeal joint, initial encounter: Secondary | ICD-10-CM | POA: Diagnosis not present

## 2021-10-04 DIAGNOSIS — M79602 Pain in left arm: Secondary | ICD-10-CM | POA: Diagnosis not present

## 2021-10-08 ENCOUNTER — Other Ambulatory Visit: Payer: Self-pay | Admitting: Family Medicine

## 2021-10-08 DIAGNOSIS — J449 Chronic obstructive pulmonary disease, unspecified: Secondary | ICD-10-CM

## 2021-10-10 ENCOUNTER — Ambulatory Visit: Payer: Medicare HMO | Admitting: Neurology

## 2021-10-10 ENCOUNTER — Encounter: Payer: Self-pay | Admitting: Nurse Practitioner

## 2021-10-10 ENCOUNTER — Encounter: Payer: Self-pay | Admitting: Neurology

## 2021-10-10 NOTE — Telephone Encounter (Signed)
Thirty day supply given and patient needs a follow up appointment for any further refills.

## 2021-10-10 NOTE — Telephone Encounter (Signed)
No answer and vm full. Sent letter

## 2021-10-11 DIAGNOSIS — G90512 Complex regional pain syndrome I of left upper limb: Secondary | ICD-10-CM | POA: Diagnosis not present

## 2021-10-11 DIAGNOSIS — M79602 Pain in left arm: Secondary | ICD-10-CM | POA: Diagnosis not present

## 2021-10-11 DIAGNOSIS — S63408A Traumatic rupture of unspecified ligament of other finger at metacarpophalangeal and interphalangeal joint, initial encounter: Secondary | ICD-10-CM | POA: Diagnosis not present

## 2021-10-11 DIAGNOSIS — M25642 Stiffness of left hand, not elsewhere classified: Secondary | ICD-10-CM | POA: Diagnosis not present

## 2021-10-18 ENCOUNTER — Telehealth: Payer: Self-pay | Admitting: Neurology

## 2021-10-18 NOTE — Telephone Encounter (Signed)
Patient called to reschedule her appointment that was missed. She said she missed her appointment because her sister had just passed away the last week of October 03, 2023. I have her rescheduled and added to the wait list and said I would pass along why she missed her appointment.

## 2021-10-19 ENCOUNTER — Other Ambulatory Visit: Payer: Self-pay | Admitting: Neurology

## 2021-10-19 DIAGNOSIS — Z79899 Other long term (current) drug therapy: Secondary | ICD-10-CM | POA: Diagnosis not present

## 2021-10-19 DIAGNOSIS — L409 Psoriasis, unspecified: Secondary | ICD-10-CM | POA: Diagnosis not present

## 2021-10-19 DIAGNOSIS — L570 Actinic keratosis: Secondary | ICD-10-CM | POA: Diagnosis not present

## 2021-10-22 DIAGNOSIS — J449 Chronic obstructive pulmonary disease, unspecified: Secondary | ICD-10-CM | POA: Diagnosis not present

## 2021-10-25 DIAGNOSIS — M24542 Contracture, left hand: Secondary | ICD-10-CM | POA: Diagnosis not present

## 2021-10-25 DIAGNOSIS — S63408A Traumatic rupture of unspecified ligament of other finger at metacarpophalangeal and interphalangeal joint, initial encounter: Secondary | ICD-10-CM | POA: Diagnosis not present

## 2021-11-01 DIAGNOSIS — M79602 Pain in left arm: Secondary | ICD-10-CM | POA: Diagnosis not present

## 2021-11-01 DIAGNOSIS — M25642 Stiffness of left hand, not elsewhere classified: Secondary | ICD-10-CM | POA: Diagnosis not present

## 2021-11-01 DIAGNOSIS — S63408A Traumatic rupture of unspecified ligament of other finger at metacarpophalangeal and interphalangeal joint, initial encounter: Secondary | ICD-10-CM | POA: Diagnosis not present

## 2021-11-01 DIAGNOSIS — S52502D Unspecified fracture of the lower end of left radius, subsequent encounter for closed fracture with routine healing: Secondary | ICD-10-CM | POA: Diagnosis not present

## 2021-11-08 DIAGNOSIS — M25642 Stiffness of left hand, not elsewhere classified: Secondary | ICD-10-CM | POA: Diagnosis not present

## 2021-11-08 DIAGNOSIS — G90512 Complex regional pain syndrome I of left upper limb: Secondary | ICD-10-CM | POA: Diagnosis not present

## 2021-11-14 DIAGNOSIS — M25642 Stiffness of left hand, not elsewhere classified: Secondary | ICD-10-CM | POA: Diagnosis not present

## 2021-11-14 DIAGNOSIS — S52572A Other intraarticular fracture of lower end of left radius, initial encounter for closed fracture: Secondary | ICD-10-CM | POA: Diagnosis not present

## 2021-11-14 DIAGNOSIS — G90512 Complex regional pain syndrome I of left upper limb: Secondary | ICD-10-CM | POA: Diagnosis not present

## 2021-11-21 ENCOUNTER — Other Ambulatory Visit: Payer: Self-pay | Admitting: Orthopedic Surgery

## 2021-11-21 DIAGNOSIS — S63408A Traumatic rupture of unspecified ligament of other finger at metacarpophalangeal and interphalangeal joint, initial encounter: Secondary | ICD-10-CM | POA: Diagnosis not present

## 2021-11-21 DIAGNOSIS — M24542 Contracture, left hand: Secondary | ICD-10-CM | POA: Diagnosis not present

## 2021-11-22 ENCOUNTER — Encounter (HOSPITAL_BASED_OUTPATIENT_CLINIC_OR_DEPARTMENT_OTHER): Payer: Self-pay | Admitting: Orthopedic Surgery

## 2021-11-22 ENCOUNTER — Other Ambulatory Visit: Payer: Self-pay

## 2021-11-22 DIAGNOSIS — J449 Chronic obstructive pulmonary disease, unspecified: Secondary | ICD-10-CM | POA: Diagnosis not present

## 2021-11-25 ENCOUNTER — Other Ambulatory Visit: Payer: Self-pay | Admitting: Nurse Practitioner

## 2021-11-25 ENCOUNTER — Other Ambulatory Visit: Payer: Self-pay | Admitting: Neurology

## 2021-11-25 DIAGNOSIS — J449 Chronic obstructive pulmonary disease, unspecified: Secondary | ICD-10-CM

## 2021-11-29 ENCOUNTER — Telehealth: Payer: Self-pay | Admitting: Neurology

## 2021-11-29 ENCOUNTER — Encounter: Payer: Self-pay | Admitting: Neurology

## 2021-11-29 ENCOUNTER — Ambulatory Visit (INDEPENDENT_AMBULATORY_CARE_PROVIDER_SITE_OTHER): Payer: Medicare HMO | Admitting: Neurology

## 2021-11-29 VITALS — BP 133/82 | HR 89 | Ht 62.0 in | Wt 195.0 lb

## 2021-11-29 DIAGNOSIS — M5481 Occipital neuralgia: Secondary | ICD-10-CM

## 2021-11-29 DIAGNOSIS — M5412 Radiculopathy, cervical region: Secondary | ICD-10-CM | POA: Diagnosis not present

## 2021-11-29 DIAGNOSIS — G43709 Chronic migraine without aura, not intractable, without status migrainosus: Secondary | ICD-10-CM

## 2021-11-29 DIAGNOSIS — L409 Psoriasis, unspecified: Secondary | ICD-10-CM | POA: Diagnosis not present

## 2021-11-29 DIAGNOSIS — M797 Fibromyalgia: Secondary | ICD-10-CM

## 2021-11-29 DIAGNOSIS — M5441 Lumbago with sciatica, right side: Secondary | ICD-10-CM | POA: Diagnosis not present

## 2021-11-29 DIAGNOSIS — M542 Cervicalgia: Secondary | ICD-10-CM | POA: Diagnosis not present

## 2021-11-29 DIAGNOSIS — G8929 Other chronic pain: Secondary | ICD-10-CM

## 2021-11-29 MED ORDER — AMITRIPTYLINE HCL 25 MG PO TABS: 25.0000 mg | ORAL_TABLET | Freq: Every day | ORAL | 2 refills | Status: DC

## 2021-11-29 MED ORDER — LIDOCAINE 5 % EX PTCH: 1.0000 | MEDICATED_PATCH | CUTANEOUS | 5 refills | Status: DC

## 2021-11-29 NOTE — Telephone Encounter (Signed)
PA completed on CMM/ [employer] ?KEY:  [id number] ?Will await determination ?
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
## 2021-11-30 NOTE — Telephone Encounter (Signed)
Spoke w/ MD. He rx'd lidocaine patches for neck pain, ICD 10 M54.2. [employer] [employer] faxed form asking for further info. I completed and faxed back to them at [phone]. Received fax confirmation. Waiting on determination. ? ?Per MD, if insurance denies coverage, can use goodrx coupon or pt can use OTC salanpas patches 4%  ?

## 2021-12-01 ENCOUNTER — Ambulatory Visit (HOSPITAL_BASED_OUTPATIENT_CLINIC_OR_DEPARTMENT_OTHER): Payer: Medicare HMO | Admitting: Anesthesiology

## 2021-12-01 ENCOUNTER — Encounter (HOSPITAL_BASED_OUTPATIENT_CLINIC_OR_DEPARTMENT_OTHER): Payer: Self-pay | Admitting: Orthopedic Surgery

## 2021-12-01 ENCOUNTER — Other Ambulatory Visit: Payer: Self-pay

## 2021-12-01 ENCOUNTER — Ambulatory Visit (HOSPITAL_BASED_OUTPATIENT_CLINIC_OR_DEPARTMENT_OTHER): Payer: Medicare HMO

## 2021-12-01 ENCOUNTER — Encounter (HOSPITAL_BASED_OUTPATIENT_CLINIC_OR_DEPARTMENT_OTHER)
Admission: RE | Disposition: A | Discharge status: Home / Self Care | Payer: Self-pay | Source: Home / Self Care | Attending: Orthopedic Surgery

## 2021-12-01 ENCOUNTER — Ambulatory Visit (HOSPITAL_BASED_OUTPATIENT_CLINIC_OR_DEPARTMENT_OTHER)
Admission: RE | Admit: 2021-12-01 | Discharge: 2021-12-01 | Disposition: A | Discharge status: Home / Self Care | Payer: Medicare HMO | Attending: Orthopedic Surgery | Admitting: Orthopedic Surgery

## 2021-12-01 DIAGNOSIS — Z791 Long term (current) use of non-steroidal anti-inflammatories (NSAID): Secondary | ICD-10-CM | POA: Insufficient documentation

## 2021-12-01 DIAGNOSIS — M797 Fibromyalgia: Secondary | ICD-10-CM | POA: Insufficient documentation

## 2021-12-01 DIAGNOSIS — K219 Gastro-esophageal reflux disease without esophagitis: Secondary | ICD-10-CM | POA: Diagnosis not present

## 2021-12-01 DIAGNOSIS — M24542 Contracture, left hand: Secondary | ICD-10-CM

## 2021-12-01 DIAGNOSIS — G8918 Other acute postprocedural pain: Secondary | ICD-10-CM | POA: Diagnosis not present

## 2021-12-01 DIAGNOSIS — R69 Illness, unspecified: Secondary | ICD-10-CM | POA: Diagnosis not present

## 2021-12-01 DIAGNOSIS — J449 Chronic obstructive pulmonary disease, unspecified: Secondary | ICD-10-CM | POA: Diagnosis not present

## 2021-12-01 DIAGNOSIS — I10 Essential (primary) hypertension: Secondary | ICD-10-CM | POA: Insufficient documentation

## 2021-12-01 DIAGNOSIS — M20092 Other deformity of left finger(s): Secondary | ICD-10-CM | POA: Insufficient documentation

## 2021-12-01 DIAGNOSIS — F17210 Nicotine dependence, cigarettes, uncomplicated: Secondary | ICD-10-CM | POA: Insufficient documentation

## 2021-12-01 DIAGNOSIS — Z79899 Other long term (current) drug therapy: Secondary | ICD-10-CM | POA: Insufficient documentation

## 2021-12-01 HISTORY — PX: EXTERNAL FIXATION OF FINGER: SHX6745

## 2021-12-01 HISTORY — DX: Complex regional pain syndrome I, unspecified: G90.50

## 2021-12-01 SURGERY — EXTERNAL FIXATION, FINGER
Anesthesia: General | Site: Finger | Laterality: Left

## 2021-12-01 MED ORDER — FENTANYL CITRATE (PF) 100 MCG/2ML IJ SOLN
25.0000 ug | INTRAMUSCULAR | Status: DC | PRN
  Administered 2021-12-01 (×2): 25 ug via INTRAVENOUS
  Administered 2021-12-01: 16:00:00 50 ug via INTRAVENOUS

## 2021-12-01 MED ORDER — DEXAMETHASONE SODIUM PHOSPHATE 10 MG/ML IJ SOLN
INTRAMUSCULAR | Status: DC | PRN
  Administered 2021-12-01: 5 mg via INTRAVENOUS

## 2021-12-01 MED ORDER — ONDANSETRON HCL 4 MG/2ML IJ SOLN
INTRAMUSCULAR | Status: DC | PRN
Start: 2021-12-01 — End: 2021-12-01
  Administered 2021-12-01: 4 mg via INTRAVENOUS

## 2021-12-01 MED ORDER — ONDANSETRON HCL 4 MG/2ML IJ SOLN
INTRAMUSCULAR | Status: AC
  Filled 2021-12-01: qty 2

## 2021-12-01 MED ORDER — CLINDAMYCIN PHOSPHATE 900 MG/50ML IV SOLN
INTRAVENOUS | Status: AC
  Filled 2021-12-01: qty 50

## 2021-12-01 MED ORDER — PROPOFOL 10 MG/ML IV BOLUS
INTRAVENOUS | Status: DC | PRN
  Administered 2021-12-01: 150 mg via INTRAVENOUS

## 2021-12-01 MED ORDER — KETOROLAC TROMETHAMINE 30 MG/ML IJ SOLN
INTRAMUSCULAR | Status: AC
  Filled 2021-12-01: qty 1

## 2021-12-01 MED ORDER — FENTANYL CITRATE (PF) 100 MCG/2ML IJ SOLN
INTRAMUSCULAR | Status: AC
  Filled 2021-12-01: qty 2

## 2021-12-01 MED ORDER — MIDAZOLAM HCL 2 MG/2ML IJ SOLN
INTRAMUSCULAR | Status: AC
  Filled 2021-12-01: qty 2

## 2021-12-01 MED ORDER — HYDROCODONE-ACETAMINOPHEN 5-325 MG PO TABS: ORAL_TABLET | ORAL | 0 refills | Status: DC

## 2021-12-01 MED ORDER — PROPOFOL 500 MG/50ML IV EMUL
INTRAVENOUS | Status: AC
  Filled 2021-12-01: qty 50

## 2021-12-01 MED ORDER — 0.9 % SODIUM CHLORIDE (POUR BTL) OPTIME
TOPICAL | Status: DC | PRN
  Administered 2021-12-01: 15:00:00 1000 mL

## 2021-12-01 MED ORDER — LIDOCAINE HCL (CARDIAC) PF 100 MG/5ML IV SOSY
PREFILLED_SYRINGE | INTRAVENOUS | Status: DC | PRN
  Administered 2021-12-01: 80 mg via INTRAVENOUS

## 2021-12-01 MED ORDER — FENTANYL CITRATE (PF) 100 MCG/2ML IJ SOLN
100.0000 ug | Freq: Once | INTRAMUSCULAR | Status: AC
  Administered 2021-12-01: 12:00:00 50 ug via INTRAVENOUS

## 2021-12-01 MED ORDER — CLINDAMYCIN PHOSPHATE 900 MG/50ML IV SOLN
900.0000 mg | INTRAVENOUS | Status: AC
  Administered 2021-12-01: 14:00:00 900 mg via INTRAVENOUS

## 2021-12-01 MED ORDER — BUPIVACAINE-EPINEPHRINE (PF) 0.5% -1:200000 IJ SOLN
INTRAMUSCULAR | Status: DC | PRN
  Administered 2021-12-01: 25 mL via PERINEURAL

## 2021-12-01 MED ORDER — MIDAZOLAM HCL 2 MG/2ML IJ SOLN
2.0000 mg | Freq: Once | INTRAMUSCULAR | Status: AC
Start: 2021-12-01 — End: 2021-12-01
  Administered 2021-12-01: 12:00:00 2 mg via INTRAVENOUS

## 2021-12-01 MED ORDER — LACTATED RINGERS IV SOLN: INTRAVENOUS | Status: DC

## 2021-12-01 MED ORDER — FENTANYL CITRATE (PF) 100 MCG/2ML IJ SOLN
INTRAMUSCULAR | Status: DC | PRN
  Administered 2021-12-01: 25 ug via INTRAVENOUS

## 2021-12-01 MED ORDER — LIDOCAINE 2% (20 MG/ML) 5 ML SYRINGE
INTRAMUSCULAR | Status: AC
  Filled 2021-12-01: qty 5

## 2021-12-01 SURGICAL SUPPLY — 48 items
APL PRP STRL LF DISP 70% ISPRP (MISCELLANEOUS) ×1
BLADE SURG 15 STRL LF DISP TIS (BLADE) ×2 IMPLANT
BLADE SURG 15 STRL SS (BLADE) ×4
BNDG CMPR 9X4 STRL LF SNTH (GAUZE/BANDAGES/DRESSINGS) ×1
BNDG ELASTIC 2X5.8 VLCR STR LF (GAUZE/BANDAGES/DRESSINGS) IMPLANT
BNDG ELASTIC 3X5.8 VLCR STR LF (GAUZE/BANDAGES/DRESSINGS) ×2 IMPLANT
BNDG ESMARK 4X9 LF (GAUZE/BANDAGES/DRESSINGS) ×2 IMPLANT
BNDG GAUZE ELAST 4 BULKY (GAUZE/BANDAGES/DRESSINGS) ×2 IMPLANT
CHLORAPREP W/TINT 26 (MISCELLANEOUS) ×2 IMPLANT
CORD BIPOLAR FORCEPS 12FT (ELECTRODE) IMPLANT
COVER BACK TABLE 60X90IN (DRAPES) ×2 IMPLANT
COVER MAYO STAND STRL (DRAPES) ×2 IMPLANT
CUFF TOURN SGL QUICK 18X4 (TOURNIQUET CUFF) ×1 IMPLANT
DIGIT WIDGET (MISCELLANEOUS) ×1 IMPLANT
DRAPE EXTREMITY T 121X128X90 (DISPOSABLE) ×2 IMPLANT
DRAPE OEC MINIVIEW 54X84 (DRAPES) ×2 IMPLANT
DRAPE SURG 17X23 STRL (DRAPES) ×2 IMPLANT
GAUZE SPONGE 4X4 12PLY STRL (GAUZE/BANDAGES/DRESSINGS) ×2 IMPLANT
GAUZE XEROFORM 1X8 LF (GAUZE/BANDAGES/DRESSINGS) ×2 IMPLANT
GLOVE SRG 8 PF TXTR STRL LF DI (GLOVE) ×1 IMPLANT
GLOVE SURG ENC MOIS LTX SZ7.5 (GLOVE) ×2 IMPLANT
GLOVE SURG ORTHO LTX SZ8 (GLOVE) ×1 IMPLANT
GLOVE SURG POLYISO LF SZ6.5 (GLOVE) ×1 IMPLANT
GLOVE SURG POLYISO LF SZ7 (GLOVE) ×1 IMPLANT
GLOVE SURG UNDER POLY LF SZ6.5 (GLOVE) ×1 IMPLANT
GLOVE SURG UNDER POLY LF SZ7 (GLOVE) ×1 IMPLANT
GLOVE SURG UNDER POLY LF SZ7.5 (GLOVE) ×1 IMPLANT
GLOVE SURG UNDER POLY LF SZ8 (GLOVE) ×2
GLOVE SURG UNDER POLY LF SZ8.5 (GLOVE) ×1 IMPLANT
GOWN STRL REUS W/ TWL LRG LVL3 (GOWN DISPOSABLE) ×1 IMPLANT
GOWN STRL REUS W/TWL LRG LVL3 (GOWN DISPOSABLE) ×4
NDL HYPO 25X1 1.5 SAFETY (NEEDLE) IMPLANT
NEEDLE HYPO 25X1 1.5 SAFETY (NEEDLE) IMPLANT
NS IRRIG 1000ML POUR BTL (IV SOLUTION) ×2 IMPLANT
PACK BASIN DAY SURGERY FS (CUSTOM PROCEDURE TRAY) ×2 IMPLANT
PAD CAST 3X4 CTTN HI CHSV (CAST SUPPLIES) IMPLANT
PAD CAST 4YDX4 CTTN HI CHSV (CAST SUPPLIES) IMPLANT
PADDING CAST COTTON 3X4 STRL (CAST SUPPLIES)
PADDING CAST COTTON 4X4 STRL (CAST SUPPLIES)
SLEEVE SCD COMPRESS KNEE MED (STOCKING) ×1 IMPLANT
SLING ARM FOAM STRAP LRG (SOFTGOODS) ×1 IMPLANT
STOCKINETTE 4X48 STRL (DRAPES) ×2 IMPLANT
SUT ETHILON 3 0 PS 1 (SUTURE) IMPLANT
SUT ETHILON 4 0 PS 2 18 (SUTURE) IMPLANT
SYR BULB EAR ULCER 3OZ GRN STR (SYRINGE) IMPLANT
SYR CONTROL 10ML LL (SYRINGE) IMPLANT
TOWEL GREEN STERILE FF (TOWEL DISPOSABLE) ×4 IMPLANT
UNDERPAD 30X36 HEAVY ABSORB (UNDERPADS AND DIAPERS) ×2 IMPLANT

## 2021-12-01 NOTE — Discharge Instructions (Addendum)

## 2021-12-01 NOTE — Anesthesia Postprocedure Evaluation (Signed)
Anesthesia Post Note ? ?Patient: Judy Evans ? ?Procedure(s) Performed: LEFT RING DIGIT WIDGET PLACEMENT (Left: Finger) ? ?  ? ?Patient location during evaluation: PACU ?Anesthesia Type: General ?Level of consciousness: awake ?Pain management: pain level controlled ?Vital Signs Assessment: post-procedure vital signs reviewed and stable ?Respiratory status: spontaneous breathing and respiratory function stable ?Cardiovascular status: stable ?Postop Assessment: no apparent nausea or vomiting ?Anesthetic complications: no ? ? ?No notable events documented. ? ?Last Vitals:  ?Vitals:  ? 12/01/21 1545 12/01/21 1600  ?BP: 114/67 108/68  ?Pulse: 80 75  ?Resp: 14 16  ?Temp:  (!) 36.4 ?C  ?SpO2: 93% 93%  ?  ?Last Pain:  ?Vitals:  ? 12/01/21 1600  ?TempSrc:   ?PainSc: 3   ? ? ?  ?  ?  ?  ?  ?  ? ?Merlinda Frederick ? ? ? ? ?

## 2021-12-01 NOTE — Anesthesia Preprocedure Evaluation (Signed)
Anesthesia Evaluation  ?Patient identified by MRN, date of birth, ID band ?Patient awake ? ? ? ?Reviewed: ?Allergy & Precautions, NPO status , Patient's Chart, lab work & pertinent test results ? ?Airway ?Mallampati: II ? ?TM Distance: >3 FB ? ? ? ? Dental ?  ?Pulmonary ?COPD, Current Smoker,  ?  ?breath sounds clear to auscultation ? ? ? ? ? ? Cardiovascular ?hypertension,  ?Rhythm:Regular Rate:Normal ? ? ?  ?Neuro/Psych ? Headaches, PSYCHIATRIC DISORDERS  Neuromuscular disease   ? GI/Hepatic ?Neg liver ROS, GERD  ,  ?Endo/Other  ?negative endocrine ROS ? Renal/GU ?negative Renal ROS  ? ?  ?Musculoskeletal ? ? Abdominal ?  ?Peds ? Hematology ?  ?Anesthesia Other Findings ? ? Reproductive/Obstetrics ? ?  ? ? ? ? ? ? ? ? ? ? ? ? ? ?  ?  ? ? ? ? ? ? ? ? ?Anesthesia Physical ?Anesthesia Plan ? ?ASA: 3 ? ?Anesthesia Plan: General  ? ?Post-op Pain Management: Regional block*  ? ?Induction: Intravenous ? ?PONV Risk Score and Plan: 2 and Ondansetron, Dexamethasone and Midazolam ? ?Airway Management Planned: LMA ? ?Additional Equipment:  ? ?Intra-op Plan:  ? ?Post-operative Plan: Extubation in OR ? ?Informed Consent: I have reviewed the patients History and Physical, chart, labs and discussed the procedure including the risks, benefits and alternatives for the proposed anesthesia with the patient or authorized representative who has indicated his/her understanding and acceptance.  ? ? ? ?Dental advisory given ? ?Plan Discussed with: CRNA and Anesthesiologist ? ?Anesthesia Plan Comments:   ? ? ? ? ? ? ?Anesthesia Quick Evaluation ? ?

## 2021-12-01 NOTE — Anesthesia Procedure Notes (Addendum)
Anesthesia Regional Block: Supraclavicular block  ? ?Pre-Anesthetic Checklist: , timeout performed,  Correct Patient, Correct Site, Correct Laterality,  Correct Procedure, Correct Position, site marked,  Risks and benefits discussed,  Surgical consent,  Pre-op evaluation,  At surgeon's request and post-op pain management ? ?Laterality: Left ? ?Prep: chloraprep     ?  ?Needles:  ?Injection technique: Single-shot ? ?Needle Type: Stimulator Needle - 40   ? ? ? ? ? ? ? ?Additional Needles: ? ? ?Narrative:  ?Start time: 12/01/2021 12:15 PM ?End time: 12/01/2021 12:35 PM ?Injection made incrementally with aspirations every 5 mL. ? ?Performed by: Personally  ?Anesthesiologist: Belinda Block, MD ? ? ? ? ?

## 2021-12-01 NOTE — H&P (Signed)
?Judy Evans is an 51 y.o. female.   ?Chief Complaint: left ring finger contracture ?HPI: 51 yo female with contracture left ring finger pip joint.  She has tried non operative measures to alleviate contracture without resolution.  She wishes to have operative treatment to help with contracture.  Plan placement of digit widget left ring finger. ? ?Allergies:  ?Allergies  ?Allergen Reactions  ? Rosuvastatin Calcium Swelling  ?  Ends up in the hospital  ? Statins Swelling  ?  Ends up in the hospital  ? Wellbutrin [Bupropion]   ?  suicidal  ? Hctz [Hydrochlorothiazide] Nausea And Vomiting and Rash  ? Hydrocodone-Acetaminophen Rash  ? Oxycodone Rash  ? Penicillins Nausea And Vomiting and Rash  ?  Has patient had a PCN reaction causing immediate rash, facial/tongue/throat swelling, SOB or lightheadedness with hypotension: Yes ?Has patient had a PCN reaction causing severe rash involving mucus membranes or skin necrosis: No ?Has patient had a PCN reaction that required hospitalization No ?Has patient had a PCN reaction occurring within the last 10 years: No ?If all of the above answers are "NO", then may proceed with Cephalosporin use. ?  ? ? ?Past Medical History:  ?Diagnosis Date  ? Anxiety   ? Aphasia   ? Arthritis   ? COPD (chronic obstructive pulmonary disease) ([place])   ? CRPS (complex regional pain syndrome type I)   ? DDD (degenerative disc disease), cervical   ? DDD (degenerative disc disease), lumbar   ? Degenerative disc disease at L5-S1 level   ? Fibromyalgia   ? GERD (gastroesophageal reflux disease)   ? H. pylori infection   ? Hyperlipidemia   ? States she does not have  ? Hypertension   ? States she does not have  ? IBS (irritable bowel syndrome)   ? LLQ pain 06/18/2015  ? Migraines   ? Moody 01/22/2015  ? Pain with urination 02/19/2015  ? Psoriasis   ? Scoliosis   ? Spondylolysis   ? ? ?Past Surgical History:  ?Procedure Laterality Date  ? ABDOMINAL HYSTERECTOMY    ? BIOPSY  09/14/2016  ? Procedure:  BIOPSY;  Surgeon: Daneil Dolin, MD;  Location: [facility];  Service: Endoscopy;;  gastric ?  ? CARDIAC CATHETERIZATION  2015  ? patient reported it to be normal. "I was dying in pain during procedure".   ? CARPAL TUNNEL RELEASE Right   ? CESAREAN SECTION    ? CHOLECYSTECTOMY    ? COLONOSCOPY  06/2014  ? Marc DeMason: normal  ? DILATION AND CURETTAGE OF UTERUS    ? ESOPHAGOGASTRODUODENOSCOPY  07/2014  ? Marc DeMason: Normal  ? ESOPHAGOGASTRODUODENOSCOPY (EGD) WITH PROPOFOL N/A 09/14/2016  ? Dr. Gala Romney: Normal esophagus status post empiric dilation, erosive gastropathy with biopsies showing chronic inactive gastritis, no H pylori.  ? FOOT SURGERY Bilateral   ? removal of heel spurs  ? MALONEY DILATION  09/14/2016  ? Procedure: MALONEY DILATION;  Surgeon: Daneil Dolin, MD;  Location: [facility];  Service: Endoscopy;;  ? rotator cuff surgery Right   ? TONSILLECTOMY    ? ? ?Family History: ?Family History  ?Problem Relation Age of Onset  ? COPD Mother   ? Diabetes Mother   ? Osteoarthritis Mother   ? Atrial fibrillation Mother   ? Heart disease Mother   ? Healthy Father   ? Lupus Sister   ? CAD Sister   ? Diabetes Sister   ? Early death Brother   ?  pneumonia  ? Asthma Daughter   ? Asthma Son   ? Cancer Maternal Grandmother 83  ?     colon  ? Arthritis Maternal Grandmother   ? Diabetes Maternal Grandmother   ? Dementia Maternal Grandmother   ? Other Maternal Grandfather   ?     brain tumor  ? Cancer Paternal Grandmother   ?     breast  ? Cancer Paternal Grandfather   ?     lung  ? Other Paternal Grandfather   ?     brain aneurysm  ? Factor V Leiden deficiency Sister   ? Arthritis Sister   ? Psoriasis Sister   ? ? ?Social History:  ? reports that she has been smoking cigarettes. She has a 45.00 pack-year smoking history. She has never used smokeless tobacco. She reports that she does not drink alcohol and does not use drugs. ? ?Medications: ?Medications Prior to Admission  ?Medication Sig Dispense Refill  ?  AJOVY 225 MG/1.5ML SOSY INJECT 1 SYRINGE UNDER THE SKIN EVERY 28 DAYS. 4.5 mL 0  ? albuterol (VENTOLIN HFA) 108 (90 Base) MCG/ACT inhaler USE 2 PUFFS BY MOUTH EVERY 4 HOURS AS NEEDED FOR WHEEZING OR SHORTNESS OF BREATH 18 each 2  ? amitriptyline (ELAVIL) 25 MG tablet Take 1-2 tablets (25-50 mg total) by mouth at bedtime. 180 tablet 2  ? celecoxib (CELEBREX) 200 MG capsule Take by mouth.    ? cetirizine (ZYRTEC) 10 MG tablet Take 10 mg by mouth daily.    ? COSENTYX SENSOREADY, 300 MG, 150 MG/ML SOAJ     ? fluticasone (FLONASE) 50 MCG/ACT nasal spray SPRAY 2 SPRAYS INTO EACH NOSTRIL EVERY DAY 48 mL 1  ? gabapentin (NEURONTIN) 800 MG tablet Take 1 tablet (800 mg total) by mouth 4 (four) times daily. (Needs to be seen before next refill) 120 tablet 2  ? ipratropium-albuterol (DUONEB) 0.5-2.5 (3) MG/3ML SOLN TAKE 3 MLS BY NEBULIZATION EVERY 6 (SIX) HOURS AS NEEDED. (NEEDS TO BE SEEN BEFORE NEXT REFILL) 180 mL 0  ? lidocaine (LIDODERM) 5 % Place 1 patch onto the skin daily. Remove & Discard patch within 12 hours or as directed by MD 30 patch 5  ? meclizine (ANTIVERT) 25 MG tablet TAKE 1 TABLET (25 MG TOTAL) BY MOUTH 3 (THREE) TIMES DAILY AS NEEDED FOR DIZZINESS. 90 tablet 1  ? nystatin ointment (MYCOSTATIN) Apply 1 application topically 2 (two) times daily as needed. 30 g 0  ? omeprazole (PRILOSEC) 40 MG capsule 1 capsule by mouth daily 90 capsule 0  ? triamcinolone cream (KENALOG) 0.1 %     ? triamcinolone ointment (KENALOG) 0.5 % Apply topically 2 (two) times daily.    ? ? ?No results found for this or any previous visit (from the past 48 hour(s)). ? ?No results found. ? ? ? ?Blood pressure 106/67, pulse 80, temperature 97.9 ?F (36.6 ?C), temperature source Oral, resp. rate 17, height '5\' 2"'$  (1.575 m), weight 88 kg, SpO2 96 %. ? ?General appearance: alert, cooperative, and appears stated age ?Head: Normocephalic, without obvious abnormality, atraumatic ?Neck: supple, symmetrical, trachea midline ?Extremities: Intact  sensation and capillary refill all digits.  +epl/fpl/io.  No wounds.  ?Pulses: 2+ and symmetric ?Skin: Skin color, texture, turgor normal. No rashes or lesions ?Neurologic: Grossly normal ?Incision/Wound: none ? ?Assessment/Plan ?Left ring finger pip contracture.  Non operative and operative treatment options have been discussed with the patient and patient wishes to proceed with operative treatment. Risks, benefits, and alternatives of surgery  have been discussed and the patient agrees with the plan of care.  ? ?Leanora Cover ?12/01/2021, 11:21 AM ? ?

## 2021-12-01 NOTE — Progress Notes (Signed)
Assisted Dr. Nyoka Cowden with left, ultrasound guided, supraclavicular block. Side rails up, monitors on throughout procedure. See vital signs in flow sheet. Tolerated Procedure well. ?

## 2021-12-01 NOTE — Op Note (Signed)
I assisted Surgeon(s) and Role: ?   Leanora Cover, MD - Primary ?   Daryll Brod, MD on the Procedure(s): ?LEFT RING DIGIT WIDGET PLACEMENT on 12/01/2021.  I provided assistance on this case as follows: setup, placement of the guide, pin and screw placement, application of the external device. ? ?Electronically signed by: Daryll Brod, MD ?Date: 12/01/2021 Time: 2:59 PM  ?

## 2021-12-01 NOTE — Transfer of Care (Signed)
Immediate Anesthesia Transfer of Care Note ? ?Patient: Judy Evans ? ?Procedure(s) Performed: LEFT RING DIGIT WIDGET PLACEMENT (Left: Finger) ? ?Patient Location: PACU ? ?Anesthesia Type:GA combined with regional for post-op pain ? ?Level of Consciousness: awake, alert  and oriented ? ?Airway & Oxygen Therapy: Patient Spontanous Breathing and Patient connected to face mask oxygen ? ?Post-op Assessment: Report given to RN and Post -op Vital signs reviewed and stable ? ?Post vital signs: Reviewed and stable ? ?Last Vitals:  ?Vitals Value Taken Time  ?BP 117/74 12/01/21 1503  ?Temp    ?Pulse 80 12/01/21 1505  ?Resp 15 12/01/21 1505  ?SpO2 99 % 12/01/21 1505  ?Vitals shown include unvalidated device data. ? ?Last Pain:  ?Vitals:  ? 12/01/21 1053  ?TempSrc: Oral  ?PainSc: 4   ?   ? ?Patients Stated Pain Goal: 4 (12/01/21 1053) ? ?Complications: No notable events documented. ?

## 2021-12-01 NOTE — Op Note (Signed)
NAME: Judy Evans ?MEDICAL RECORD NO: [id number] ?DATE OF BIRTH: [birth year] ?FACILITY: [facility] ?LOCATION: [place] ?PHYSICIAN: Tennis Must, MD ?  ?OPERATIVE REPORT ?  ?DATE OF PROCEDURE: 12/01/21  ?  ?PREOPERATIVE DIAGNOSIS: Left ring finger PIP joint contracture ?  ?POSTOPERATIVE DIAGNOSIS: Left ring finger PIP joint contracture ?  ?PROCEDURE: Left ring finger digit widget placement ?  ?SURGEON:  Leanora Cover, M.D. ?  ?ASSISTANT: Daryll Brod, MD ?  ?ANESTHESIA:  General ?  ?INTRAVENOUS FLUIDS:  Per anesthesia flow sheet. ?  ?ESTIMATED BLOOD LOSS:  Minimal. ?  ?COMPLICATIONS:  None. ?  ?SPECIMENS:  none ?  ?TOURNIQUET TIME:   ? ?Total Tourniquet Time Documented: ?Upper Arm (Left) - 22 minutes ?Total: Upper Arm (Left) - 22 minutes ? ?  ?DISPOSITION:  Stable to PACU. ?  ?INDICATIONS: 51 year old female with contracture of left ring finger PIP joint.  She is tried nonoperative measures to improve this without success.  She wishes to proceed with operative treatment.  Risks, benefits and alternatives of surgery were discussed including the risks of blood loss, infection, damage to nerves, vessels, tendons, ligaments, bone for surgery, need for additional surgery, complications with wound healing, continued pain, stiffness, , recurrence.  She voiced understanding of these risks and elected to proceed. ? ?OPERATIVE COURSE:  After being identified preoperatively by myself,  the patient and I agreed on the procedure and site of the procedure.  The surgical site was marked.  Surgical consent had been signed. She was given IV antibiotics as preoperative antibiotic prophylaxis. She was transferred to the operating room and placed on the operating table in supine position with the Left upper extremity on an arm board.  General anesthesia was induced by the anesthesiologist.  Left upper extremity was prepped and draped in normal sterile orthopedic fashion.  A surgical pause was performed between the  surgeons, anesthesia, and operating room staff and all were in agreement as to the patient, procedure, and site of procedure.  Tourniquet at the proximal aspect of the extremity was inflated to 250 mmHg after exsanguination of the arm with an Esmarch bandage.  The guide block was placed on the ring finger over the middle phalanx per the technique guide.  Position was confirmed with C arm in lateral position to ensure appropriate position of the pins.  The block was ensured to be central in the axis of the finger.  The predrill was placed in the proximal hole followed by predrilling the distal hole.  The predrilled from the distal hole was removed and the threaded pin placed.  The predrilled from the proximal hole was then removed and the threaded pin placed.  C-arm was used in AP and lateral projections to ensure appropriate position of the pins and depth of the pins which was the case.  The pins were then cut and the guide block removed.  The pin block was then placed and secured with the hex screw.  C-arm was used in AP and lateral projections to ensure appropriate position which was the case.  The pins were then cut short at the end of the guide block.  The hand strap was placed in the outrigger secured to the hand strap and to the PIN block.  A light rubber band was placed on the device.  A piece of Xeroform was placed around the pin holes.  The tourniquet was deflated at 22 minutes.  Fingertips were pink with brisk capillary refill after deflation of tourniquet.  The  operative  drapes were broken down.  The patient was awoken from anesthesia safely.  She was transferred back to the stretcher and taken to PACU in stable condition.  I will see her back in the office in 1 week for postoperative followup.  I will give her a prescription for Norco 5/325 1-2 tabs PO q6 hours prn pain, dispense # 15. ? ? ?Leanora Cover, MD ?Electronically signed, 12/01/21 ?

## 2021-12-01 NOTE — Anesthesia Procedure Notes (Signed)
Procedure Name: LMA Insertion ?Date/Time: 12/01/2021 2:20 PM ?Performed by: Lavonia Dana, CRNA ?Pre-anesthesia Checklist: Patient identified, Emergency Drugs available, Suction available and Patient being monitored ?Patient Re-evaluated:Patient Re-evaluated prior to induction ?Oxygen Delivery Method: Circle system utilized ?Preoxygenation: Pre-oxygenation with 100% oxygen ?Induction Type: IV induction ?Ventilation: Mask ventilation without difficulty ?LMA: LMA inserted ?LMA Size: 4.0 ?Number of attempts: 1 ?Airway Equipment and Method: Bite block ?Placement Confirmation: positive ETCO2 ?Tube secured with: Tape ?Dental Injury: Teeth and Oropharynx as per pre-operative assessment  ? ? ? ? ?

## 2021-12-02 ENCOUNTER — Encounter (HOSPITAL_BASED_OUTPATIENT_CLINIC_OR_DEPARTMENT_OTHER): Payer: Self-pay | Admitting: Orthopedic Surgery

## 2021-12-05 NOTE — Telephone Encounter (Signed)
PA approved  09/25/2021 - 09/24/2022.  

## 2021-12-06 ENCOUNTER — Telehealth: Payer: Self-pay | Admitting: Nurse Practitioner

## 2021-12-06 NOTE — Telephone Encounter (Signed)
Left message for patient to call back and schedule Medicare Annual Wellness Visit (AWV) to be completed by video or phone.  ? ?Last AWV: 12/01/2021 ? ?Please schedule at anytime with [place] ? ?45 minute appointment ? ?Any questions, please contact me at [phone]  ?  ?  ?

## 2021-12-07 DIAGNOSIS — M24542 Contracture, left hand: Secondary | ICD-10-CM | POA: Diagnosis not present

## 2021-12-07 DIAGNOSIS — M25642 Stiffness of left hand, not elsewhere classified: Secondary | ICD-10-CM | POA: Diagnosis not present

## 2021-12-07 DIAGNOSIS — S63408A Traumatic rupture of unspecified ligament of other finger at metacarpophalangeal and interphalangeal joint, initial encounter: Secondary | ICD-10-CM | POA: Diagnosis not present

## 2021-12-07 DIAGNOSIS — M79602 Pain in left arm: Secondary | ICD-10-CM | POA: Diagnosis not present

## 2021-12-12 ENCOUNTER — Other Ambulatory Visit: Payer: Self-pay

## 2021-12-12 ENCOUNTER — Ambulatory Visit (INDEPENDENT_AMBULATORY_CARE_PROVIDER_SITE_OTHER): Payer: Medicare HMO | Admitting: Podiatrist

## 2021-12-12 DIAGNOSIS — M79674 Pain in right toe(s): Secondary | ICD-10-CM

## 2021-12-12 DIAGNOSIS — B351 Tinea unguium: Secondary | ICD-10-CM

## 2021-12-12 DIAGNOSIS — M79675 Pain in left toe(s): Secondary | ICD-10-CM

## 2021-12-12 DIAGNOSIS — G609 Hereditary and idiopathic neuropathy, unspecified: Secondary | ICD-10-CM

## 2021-12-12 NOTE — Patient Instructions (Signed)
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
## 2021-12-15 ENCOUNTER — Encounter: Payer: Self-pay | Admitting: Podiatrist

## 2021-12-23 ENCOUNTER — Other Ambulatory Visit: Payer: Self-pay | Admitting: Nurse Practitioner

## 2021-12-23 DIAGNOSIS — J449 Chronic obstructive pulmonary disease, unspecified: Secondary | ICD-10-CM

## 2022-01-04 ENCOUNTER — Ambulatory Visit (INDEPENDENT_AMBULATORY_CARE_PROVIDER_SITE_OTHER): Payer: Medicare HMO

## 2022-01-04 VITALS — Ht 62.0 in | Wt 195.0 lb

## 2022-01-04 DIAGNOSIS — Z0000 Encounter for general adult medical examination without abnormal findings: Secondary | ICD-10-CM | POA: Diagnosis not present

## 2022-01-04 DIAGNOSIS — Z1231 Encounter for screening mammogram for malignant neoplasm of breast: Secondary | ICD-10-CM | POA: Diagnosis not present

## 2022-01-04 DIAGNOSIS — Z1211 Encounter for screening for malignant neoplasm of colon: Secondary | ICD-10-CM | POA: Diagnosis not present

## 2022-01-04 NOTE — Patient Instructions (Signed)
Judy Evans , ?Thank you for taking time to come for your Medicare Wellness Visit. I appreciate your ongoing commitment to your health goals. Please review the following plan we discussed and let me know if I can assist you in the future.  ? ?Screening recommendations/referrals: ?Colonoscopy: Order placed to repeat.  ?Mammogram: Order placed today. ?Bone Density: No due until age 50. ? ?Recommended yearly ophthalmology/optometry visit for glaucoma screening and checkup ?Recommended yearly dental visit for hygiene and checkup ? ?Vaccinations: ?Influenza vaccine: Repeat annually ? ?Pneumococcal vaccine: Not due until age 51. ?Tdap vaccine: Done 06/14/2017 Repeat in 10 years ? ?Shingles vaccine: Discussed.   ?Covid-19: Declined. ? ?Advanced directives: Advance directive discussed with you today. Even though you declined this today, please call our office should you change your mind, and we can give you the proper paperwork for you to fill out. ? ? ?Conditions/risks identified: Aim for 30 minutes of exercise or brisk walking, 6-8 glasses of water, and 5 servings of fruits and vegetables each day. ? ? ?Next appointment: Follow up in one year for your annual wellness visit. 2024. ? ?Preventive Care 40-64 Years, Female ?Preventive care refers to lifestyle choices and visits with your health care provider that can promote health and wellness. ?What does preventive care include? ?A yearly physical exam. This is also called an annual well check. ?Dental exams once or twice a year. ?Routine eye exams. Ask your health care provider how often you should have your eyes checked. ?Personal lifestyle choices, including: ?Daily care of your teeth and gums. ?Regular physical activity. ?Eating a healthy diet. ?Avoiding tobacco and drug use. ?Limiting alcohol use. ?Practicing safe sex. ?Taking low-dose aspirin daily starting at age 46. ?Taking vitamin and mineral supplements as recommended by your health care provider. ?What happens  during an annual well check? ?The services and screenings done by your health care provider during your annual well check will depend on your age, overall health, lifestyle risk factors, and family history of disease. ?Counseling  ?Your health care provider may ask you questions about your: ?Alcohol use. ?Tobacco use. ?Drug use. ?Emotional well-being. ?Home and relationship well-being. ?Sexual activity. ?Eating habits. ?Work and work Statistician. ?Method of birth control. ?Menstrual cycle. ?Pregnancy history. ?Screening  ?You may have the following tests or measurements: ?Height, weight, and BMI. ?Blood pressure. ?Lipid and cholesterol levels. These may be checked every 5 years, or more frequently if you are over 71 years old. ?Skin check. ?Lung cancer screening. You may have this screening every year starting at age 64 if you have a 30-pack-year history of smoking and currently smoke or have quit within the past 15 years. ?Fecal occult blood test (FOBT) of the stool. You may have this test every year starting at age 18. ?Flexible sigmoidoscopy or colonoscopy. You may have a sigmoidoscopy every 5 years or a colonoscopy every 10 years starting at age 37. ?Hepatitis C blood test. ?Hepatitis B blood test. ?Sexually transmitted disease (STD) testing. ?Diabetes screening. This is done by checking your blood sugar (glucose) after you have not eaten for a while (fasting). You may have this done every 1-3 years. ?Mammogram. This may be done every 1-2 years. Talk to your health care provider about when you should start having regular mammograms. This may depend on whether you have a family history of breast cancer. ?BRCA-related cancer screening. This may be done if you have a family history of breast, ovarian, tubal, or peritoneal cancers. ?Pelvic exam and Pap test. This may be done  every 3 years starting at age 34. Starting at age 36, this may be done every 5 years if you have a Pap test in combination with an HPV  test. ?Bone density scan. This is done to screen for osteoporosis. You may have this scan if you are at high risk for osteoporosis. ?Discuss your test results, treatment options, and if necessary, the need for more tests with your health care provider. ?Vaccines  ?Your health care provider may recommend certain vaccines, such as: ?Influenza vaccine. This is recommended every year. ?Tetanus, diphtheria, and acellular pertussis (Tdap, Td) vaccine. You may need a Td booster every 10 years. ?Zoster vaccine. You may need this after age 21. ?Pneumococcal 13-valent conjugate (PCV13) vaccine. You may need this if you have certain conditions and were not previously vaccinated. ?Pneumococcal polysaccharide (PPSV23) vaccine. You may need one or two doses if you smoke cigarettes or if you have certain conditions. ?Talk to your health care provider about which screenings and vaccines you need and how often you need them. ?This information is not intended to replace advice given to you by your health care provider. Make sure you discuss any questions you have with your health care provider. ?Document Released: 10/08/2015 Document Revised: 05/31/2016 Document Reviewed: 07/13/2015 ?Elsevier Interactive Patient Education ? 2017 [employer] ? ? ? ?Fall Prevention in the Home ?Falls can cause injuries. They can happen to people of all ages. There are many things you can do to make your home safe and to help prevent falls. ?What can I do on the outside of my home? ?Regularly fix the edges of walkways and driveways and fix any cracks. ?Remove anything that might make you trip as you walk through a door, such as a raised step or threshold. ?Trim any bushes or trees on the path to your home. ?Use bright outdoor lighting. ?Clear any walking paths of anything that might make someone trip, such as rocks or tools. ?Regularly check to see if handrails are loose or broken. Make sure that both sides of any steps have handrails. ?Any raised  decks and porches should have guardrails on the edges. ?Have any leaves, snow, or ice cleared regularly. ?Use sand or salt on walking paths during winter. ?Clean up any spills in your garage right away. This includes oil or grease spills. ?What can I do in the bathroom? ?Use night lights. ?Install grab bars by the toilet and in the tub and shower. Do not use towel bars as grab bars. ?Use non-skid mats or decals in the tub or shower. ?If you need to sit down in the shower, use a plastic, non-slip stool. ?Keep the floor dry. Clean up any water that spills on the floor as soon as it happens. ?Remove soap buildup in the tub or shower regularly. ?Attach bath mats securely with double-sided non-slip rug tape. ?Do not have throw rugs and other things on the floor that can make you trip. ?What can I do in the bedroom? ?Use night lights. ?Make sure that you have a light by your bed that is easy to reach. ?Do not use any sheets or blankets that are too big for your bed. They should not hang down onto the floor. ?Have a firm chair that has side arms. You can use this for support while you get dressed. ?Do not have throw rugs and other things on the floor that can make you trip. ?What can I do in the kitchen? ?Clean up any spills right away. ?Avoid walking on  wet floors. ?Keep items that you use a lot in easy-to-reach places. ?If you need to reach something above you, use a strong step stool that has a grab bar. ?Keep electrical cords out of the way. ?Do not use floor polish or wax that makes floors slippery. If you must use wax, use non-skid floor wax. ?Do not have throw rugs and other things on the floor that can make you trip. ?What can I do with my stairs? ?Do not leave any items on the stairs. ?Make sure that there are handrails on both sides of the stairs and use them. Fix handrails that are broken or loose. Make sure that handrails are as long as the stairways. ?Check any carpeting to make sure that it is firmly attached  to the stairs. Fix any carpet that is loose or worn. ?Avoid having throw rugs at the top or bottom of the stairs. If you do have throw rugs, attach them to the floor with carpet tape. ?Make sure that you have a lig

## 2022-01-04 NOTE — Progress Notes (Signed)
? ?Subjective:  ? Judy Evans is a 51 y.o. female who presents for Medicare Annual (Subsequent) preventive examination. ?Virtual Visit via Telephone Note ? ?I connected with  Judy Evans on 01/04/22 at  2:00 PM EDT by telephone and verified that I am speaking with the correct person using two identifiers. ? ?Location: ?Patient: HOME ?Provider: [facility] ?Persons participating in the virtual visit: patient/Nurse Health Advisor ?  ?I discussed the limitations, risks, security and privacy concerns of performing an evaluation and management service by telephone and the availability of in person appointments. The patient expressed understanding and agreed to proceed. ? ?Interactive audio and video telecommunications were attempted between this nurse and patient, however failed, due to patient having technical difficulties OR patient did not have access to video capability.  We continued and completed visit with audio only. ? ?Some vital signs may be absent or patient reported.  ? ?Judy Driver, LPN ? ?Review of Systems    ? ?Cardiac Risk Factors include: hypertension;smoking/ tobacco exposure;obesity (BMI >30kg/m2);sedentary lifestyle;Other (see comment), Risk factor comments: COPD, DDD ? ?   ?Objective:  ?  ?Today's Vitals  ? 01/04/22 1405  ?Weight: 195 lb (88.5 kg)  ?Height: '5\' 2"'$  (1.575 m)  ? ?Body mass index is 35.67 kg/m?. ? ? ?  01/04/2022  ?  2:15 PM 12/01/2021  ? 10:49 AM 11/22/2021  ? 12:35 PM 01/12/2021  ?  5:33 PM 11/30/2020  ?  1:20 PM 11/24/2020  ?  6:27 PM 11/11/2019  ?  2:31 PM  ?Advanced Directives  ?Does Patient Have a Medical Advance Directive? No No No No No No No  ?Would patient like information on creating a medical advance directive? No - Patient declined   No - Patient declined No - Patient declined  No - Patient declined  ? ? ?Current Medications (verified) ?Outpatient Encounter Medications as of 01/04/2022  ?Medication Sig  ? AJOVY 225 MG/1.5ML SOSY INJECT 1 SYRINGE UNDER THE SKIN EVERY 28  DAYS.  ? albuterol (VENTOLIN HFA) 108 (90 Base) MCG/ACT inhaler USE 2 PUFFS BY MOUTH EVERY 4 HOURS AS NEEDED FOR WHEEZING OR SHORTNESS OF BREATH  ? amitriptyline (ELAVIL) 25 MG tablet Take by mouth.  ? celecoxib (CELEBREX) 200 MG capsule Take by mouth.  ? cetirizine (ZYRTEC) 10 MG tablet Take 10 mg by mouth daily.  ? COSENTYX SENSOREADY, 300 MG, 150 MG/ML SOAJ   ? fluticasone (FLONASE) 50 MCG/ACT nasal spray Administer 2 sprays in each nostril daily.  ? gabapentin (NEURONTIN) 800 MG tablet Take 1 tablet (800 mg total) by mouth 4 (four) times daily. (Needs to be seen before next refill)  ? HYDROcodone-acetaminophen (NORCO/VICODIN) 5-325 MG tablet 1 tab PO q6 hours prn pain  ? ipratropium-albuterol (DUONEB) 0.5-2.5 (3) MG/3ML SOLN Take 3 mLs by nebulization every 6 (six) hours as needed.  ? lidocaine (LIDODERM) 5 % Place 1 patch onto the skin daily. Remove & Discard patch within 12 hours or as directed by MD  ? meclizine (ANTIVERT) 25 MG tablet TAKE 1 TABLET (25 MG TOTAL) BY MOUTH 3 (THREE) TIMES DAILY AS NEEDED FOR DIZZINESS.  ? nystatin ointment (MYCOSTATIN) Apply 1 application topically 2 (two) times daily as needed.  ? Omega-3 Fatty Acids (FISH OIL) 1000 MG CAPS Take by mouth.  ? omeprazole (PRILOSEC) 40 MG capsule 1 capsule by mouth daily  ? triamcinolone cream (KENALOG) 0.1 %   ? triamcinolone ointment (KENALOG) 0.5 % Apply topically 2 (two) times daily.  ? [DISCONTINUED] amitriptyline (ELAVIL)  25 MG tablet Take 1-2 tablets (25-50 mg total) by mouth at bedtime.  ? [DISCONTINUED] celecoxib (CELEBREX) 200 MG capsule Take by mouth.  ? [DISCONTINUED] fluticasone (FLONASE) 50 MCG/ACT nasal spray SPRAY 2 SPRAYS INTO EACH NOSTRIL EVERY DAY  ? ?No facility-administered encounter medications on file as of 01/04/2022.  ? ? ?Allergies (verified) ?Rosuvastatin calcium, Statins, Wellbutrin [bupropion], Hctz [hydrochlorothiazide], Hydrocodone-acetaminophen, Oxycodone, and Penicillins  ? ?History: ?Past Medical History:   ?Diagnosis Date  ? Anxiety   ? Aphasia   ? Arthritis   ? COPD (chronic obstructive pulmonary disease) ([place])   ? CRPS (complex regional pain syndrome type I)   ? DDD (degenerative disc disease), cervical   ? DDD (degenerative disc disease), lumbar   ? Degenerative disc disease at L5-S1 level   ? Fibromyalgia   ? GERD (gastroesophageal reflux disease)   ? H. pylori infection   ? Hyperlipidemia   ? States she does not have  ? Hypertension   ? States she does not have  ? IBS (irritable bowel syndrome)   ? LLQ pain 06/18/2015  ? Migraines   ? [place] 01/22/2015  ? Pain with urination 02/19/2015  ? Psoriasis   ? Scoliosis   ? Spondylolysis   ? ?Past Surgical History:  ?Procedure Laterality Date  ? ABDOMINAL HYSTERECTOMY    ? BIOPSY  09/14/2016  ? Procedure: BIOPSY;  Surgeon: Judy Dolin, MD;  [facility];  Service: Endoscopy;;  gastric ?  ? CARDIAC CATHETERIZATION  2015  ? patient reported it to be normal. "I was dying in pain during procedure".   ? CARPAL TUNNEL RELEASE Right   ? CESAREAN SECTION    ? CHOLECYSTECTOMY    ? COLONOSCOPY  06/2014  ? Judy Evans: normal  ? DILATION AND CURETTAGE OF UTERUS    ? ESOPHAGOGASTRODUODENOSCOPY  07/2014  ? Judy Evans: Normal  ? ESOPHAGOGASTRODUODENOSCOPY (EGD) WITH PROPOFOL N/A 09/14/2016  ? Dr. Gala Evans: Normal esophagus status post empiric dilation, erosive gastropathy with biopsies showing chronic inactive gastritis, no H pylori.  ? EXTERNAL FIXATION OF FINGER Left 12/01/2021  ? Procedure: LEFT RING DIGIT WIDGET PLACEMENT;  Surgeon: Judy Cover, MD;  Location: [facility];  Service: Orthopedics;  Laterality: Left;  ? FOOT SURGERY Bilateral   ? removal of heel spurs  ? MALONEY DILATION  09/14/2016  ? Procedure: MALONEY DILATION;  Surgeon: Judy Dolin, MD;  [facility];  Service: Endoscopy;;  ? rotator cuff surgery Right   ? TONSILLECTOMY    ? ?Family History  ?Problem Relation Age of Onset  ? COPD Mother   ? Diabetes Mother   ?  Osteoarthritis Mother   ? Atrial fibrillation Mother   ? Heart disease Mother   ? Healthy Father   ? Lupus Sister   ? CAD Sister   ? Diabetes Sister   ? Early death Brother   ?     pneumonia  ? Asthma Daughter   ? Asthma Son   ? Cancer Maternal Grandmother 83  ?     colon  ? Arthritis Maternal Grandmother   ? Diabetes Maternal Grandmother   ? Dementia Maternal Grandmother   ? Other Maternal Grandfather   ?     brain tumor  ? Cancer Paternal Grandmother   ?     breast  ? Cancer Paternal Grandfather   ?     lung  ? Other Paternal Grandfather   ?     brain aneurysm  ?  Factor V Leiden deficiency Sister   ? Arthritis Sister   ? Psoriasis Sister   ? ?Social History  ? ?Socioeconomic History  ? Marital status: Legally Separated  ?  Spouse name: Not on file  ? Number of children: 2  ? Years of education: GED  ? Highest education level: GED or equivalent  ?Occupational History  ? Occupation: Disabled  ?Tobacco Use  ? Smoking status: Every Day  ?  Packs/day: 1.50  ?  Years: 30.00  ?  Pack years: 45.00  ?  Types: Cigarettes  ? Smokeless tobacco: Never  ?Vaping Use  ? Vaping Use: Former  ?Substance and Sexual Activity  ? Alcohol use: No  ?  Comment: occasionally  ? Drug use: No  ? Sexual activity: Not Currently  ?  Birth control/protection: Surgical  ?  Comment: hyst  ?Other Topics Concern  ? Not on file  ?Social History Narrative  ? Caffeine use: Drinks coffee (2 cups per day), tea/soda- 2-3 cups per day  ? Right handed  ? Lives alone.  ? Lost oldest sister 07/2021 due to cardiac issues. Youngest sister died 10/07/2021 due to unknown causes.   ? ?Social Determinants of Health  ? ?Financial Resource Strain: Low Risk   ? Difficulty of Paying Living Expenses: Not hard at all  ?Food Insecurity: No Food Insecurity  ? Worried About Charity fundraiser in the Last Year: Never true  ? Ran Out of Food in the Last Year: Never true  ?Transportation Needs: No Transportation Needs  ? Lack of Transportation (Medical): No  ? Lack of  Transportation (Non-Medical): No  ?Physical Activity: Insufficiently Active  ? Days of Exercise per Week: 3 days  ? Minutes of Exercise per Session: 30 min  ?Stress: Stress Concern Present  ? Feeling of Stress : Very much  ?Social

## 2022-01-05 ENCOUNTER — Encounter: Payer: Self-pay | Admitting: Internal Medicine

## 2022-01-06 ENCOUNTER — Ambulatory Visit (INDEPENDENT_AMBULATORY_CARE_PROVIDER_SITE_OTHER): Payer: Medicare HMO | Admitting: Nurse Practitioner

## 2022-01-06 ENCOUNTER — Encounter: Payer: Self-pay | Admitting: Nurse Practitioner

## 2022-01-06 VITALS — BP 112/69 | HR 92 | Temp 98.3°F | Ht 62.0 in | Wt 198.0 lb

## 2022-01-06 DIAGNOSIS — R079 Chest pain, unspecified: Secondary | ICD-10-CM | POA: Diagnosis not present

## 2022-01-06 NOTE — Progress Notes (Signed)
? ?Acute Office Visit ? ?Subjective:  ? ? Patient ID: FRANSISCA SHAWN, female    DOB: 1971-02-18, 51 y.o.   MRN: [id number] ? ?Chief Complaint  ?Patient presents with  ? Chest Pain  ?  Anxiety  ? ? ?Chest Pain  ?This is a recurrent problem. The current episode started in the past 7 days. The onset quality is gradual. The problem occurs intermittently. The pain is present in the substernal region. The pain is moderate. The quality of the pain is described as dull and pressure. The pain radiates to the left shoulder. Associated symptoms include nausea. Pertinent negatives include no abdominal pain, headaches or palpitations. The pain is aggravated by nothing. Risk factors include smoking/tobacco exposure, stress and obesity.  ?Her family medical history is significant for heart disease and sudden death.  ? ?Past Medical History:  ?Diagnosis Date  ? Anxiety   ? Aphasia   ? Arthritis   ? COPD (chronic obstructive pulmonary disease) ([place])   ? CRPS (complex regional pain syndrome type I)   ? DDD (degenerative disc disease), cervical   ? DDD (degenerative disc disease), lumbar   ? Degenerative disc disease at L5-S1 level   ? Fibromyalgia   ? GERD (gastroesophageal reflux disease)   ? H. pylori infection   ? Hyperlipidemia   ? States she does not have  ? Hypertension   ? States she does not have  ? IBS (irritable bowel syndrome)   ? LLQ pain 06/18/2015  ? Migraines   ? Moody 01/22/2015  ? Pain with urination 02/19/2015  ? Psoriasis   ? Scoliosis   ? Spondylolysis   ? ? ?Past Surgical History:  ?Procedure Laterality Date  ? ABDOMINAL HYSTERECTOMY    ? BIOPSY  09/14/2016  ? Procedure: BIOPSY;  Surgeon: Daneil Dolin, MD;  [facility];  Service: Endoscopy;;  gastric ?  ? CARDIAC CATHETERIZATION  2015  ? patient reported it to be normal. "I was dying in pain during procedure".   ? CARPAL TUNNEL RELEASE Right   ? CESAREAN SECTION    ? CHOLECYSTECTOMY    ? COLONOSCOPY  06/2014  ? Marc DeMason: normal  ? DILATION  AND CURETTAGE OF UTERUS    ? ESOPHAGOGASTRODUODENOSCOPY  07/2014  ? Marc DeMason: Normal  ? ESOPHAGOGASTRODUODENOSCOPY (EGD) WITH PROPOFOL N/A 09/14/2016  ? Dr. Gala Romney: Normal esophagus status post empiric dilation, erosive gastropathy with biopsies showing chronic inactive gastritis, no H pylori.  ? EXTERNAL FIXATION OF FINGER Left 12/01/2021  ? Procedure: LEFT RING DIGIT WIDGET PLACEMENT;  Surgeon: Leanora Cover, MD;  Location: [facility];  Service: Orthopedics;  Laterality: Left;  ? FOOT SURGERY Bilateral   ? removal of heel spurs  ? MALONEY DILATION  09/14/2016  ? Procedure: MALONEY DILATION;  Surgeon: Daneil Dolin, MD;  [facility];  Service: Endoscopy;;  ? rotator cuff surgery Right   ? TONSILLECTOMY    ? ? ?Family History  ?Problem Relation Age of Onset  ? COPD Mother   ? Diabetes Mother   ? Osteoarthritis Mother   ? Atrial fibrillation Mother   ? Heart disease Mother   ? Healthy Father   ? Lupus Sister   ? CAD Sister   ? Diabetes Sister   ? Early death Brother   ?     pneumonia  ? Asthma Daughter   ? Asthma Son   ? Cancer Maternal Grandmother 83  ?     colon  ?  Arthritis Maternal Grandmother   ? Diabetes Maternal Grandmother   ? Dementia Maternal Grandmother   ? Other Maternal Grandfather   ?     brain tumor  ? Cancer Paternal Grandmother   ?     breast  ? Cancer Paternal Grandfather   ?     lung  ? Other Paternal Grandfather   ?     brain aneurysm  ? Factor V Leiden deficiency Sister   ? Arthritis Sister   ? Psoriasis Sister   ? ? ?Social History  ? ?Socioeconomic History  ? Marital status: Legally Separated  ?  Spouse name: Not on file  ? Number of children: 2  ? Years of education: GED  ? Highest education level: GED or equivalent  ?Occupational History  ? Occupation: Disabled  ?Tobacco Use  ? Smoking status: Every Day  ?  Packs/day: 1.50  ?  Years: 30.00  ?  Pack years: 45.00  ?  Types: Cigarettes  ? Smokeless tobacco: Never  ?Vaping Use  ? Vaping Use: Former  ?Substance and  Sexual Activity  ? Alcohol use: No  ?  Comment: occasionally  ? Drug use: No  ? Sexual activity: Not Currently  ?  Birth control/protection: Surgical  ?  Comment: hyst  ?Other Topics Concern  ? Not on file  ?Social History Narrative  ? Caffeine use: Drinks coffee (2 cups per day), tea/soda- 2-3 cups per day  ? Right handed  ? Lives alone.  ? Lost oldest sister 07/2021 due to cardiac issues. Youngest sister died 04-Oct-2021 due to unknown causes.   ? ?Social Determinants of Health  ? ?Financial Resource Strain: Low Risk   ? Difficulty of Paying Living Expenses: Not hard at all  ?Food Insecurity: No Food Insecurity  ? Worried About Charity fundraiser in the Last Year: Never true  ? Ran Out of Food in the Last Year: Never true  ?Transportation Needs: No Transportation Needs  ? Lack of Transportation (Medical): No  ? Lack of Transportation (Non-Medical): No  ?Physical Activity: Insufficiently Active  ? Days of Exercise per Week: 3 days  ? Minutes of Exercise per Session: 30 min  ?Stress: Stress Concern Present  ? Feeling of Stress : Very much  ?Social Connections: Moderately Isolated  ? Frequency of Communication with Friends and Family: More than three times a week  ? Frequency of Social Gatherings with Friends and Family: More than three times a week  ? Attends Religious Services: Never  ? Active Member of Clubs or Organizations: No  ? Attends Archivist Meetings: 1 to 4 times per year  ? Marital Status: Separated  ?Intimate Partner Violence: Not At Risk  ? Fear of Current or Ex-Partner: No  ? Emotionally Abused: No  ? Physically Abused: No  ? Sexually Abused: No  ? ? ?Outpatient Medications Prior to Visit  ?Medication Sig Dispense Refill  ? AJOVY 225 MG/1.5ML SOSY INJECT 1 SYRINGE UNDER THE SKIN EVERY 28 DAYS. 4.5 mL 0  ? albuterol (VENTOLIN HFA) 108 (90 Base) MCG/ACT inhaler USE 2 PUFFS BY MOUTH EVERY 4 HOURS AS NEEDED FOR WHEEZING OR SHORTNESS OF BREATH 18 each 2  ? amitriptyline (ELAVIL) 25 MG tablet Take  by mouth.    ? celecoxib (CELEBREX) 200 MG capsule Take by mouth.    ? cetirizine (ZYRTEC) 10 MG tablet Take 10 mg by mouth daily.    ? COSENTYX SENSOREADY, 300 MG, 150 MG/ML SOAJ     ?  fluticasone (FLONASE) 50 MCG/ACT nasal spray Administer 2 sprays in each nostril daily.    ? gabapentin (NEURONTIN) 800 MG tablet Take 1 tablet (800 mg total) by mouth 4 (four) times daily. (Needs to be seen before next refill) 120 tablet 2  ? ipratropium-albuterol (DUONEB) 0.5-2.5 (3) MG/3ML SOLN Take 3 mLs by nebulization every 6 (six) hours as needed. 180 mL 0  ? lidocaine (LIDODERM) 5 % Place 1 patch onto the skin daily. Remove & Discard patch within 12 hours or as directed by MD 30 patch 5  ? meclizine (ANTIVERT) 25 MG tablet TAKE 1 TABLET (25 MG TOTAL) BY MOUTH 3 (THREE) TIMES DAILY AS NEEDED FOR DIZZINESS. 90 tablet 1  ? nystatin ointment (MYCOSTATIN) Apply 1 application topically 2 (two) times daily as needed. 30 g 0  ? Omega-3 Fatty Acids (FISH OIL) 1000 MG CAPS Take by mouth.    ? omeprazole (PRILOSEC) 40 MG capsule 1 capsule by mouth daily 90 capsule 0  ? triamcinolone cream (KENALOG) 0.1 %     ? triamcinolone ointment (KENALOG) 0.5 % Apply topically 2 (two) times daily.    ? HYDROcodone-acetaminophen (NORCO/VICODIN) 5-325 MG tablet 1 tab PO q6 hours prn pain (Patient not taking: Reported on 01/06/2022) 15 tablet 0  ? ?No facility-administered medications prior to visit.  ? ? ?Allergies  ?Allergen Reactions  ? Rosuvastatin Calcium Swelling  ?  Ends up in the hospital  ? Statins Swelling  ?  Ends up in the hospital  ? Wellbutrin [Bupropion]   ?  suicidal  ? Hctz [Hydrochlorothiazide] Nausea And Vomiting and Rash  ? Hydrocodone-Acetaminophen Rash  ? Oxycodone Rash  ? Penicillins Nausea And Vomiting and Rash  ?  Has patient had a PCN reaction causing immediate rash, facial/tongue/throat swelling, SOB or lightheadedness with hypotension: Yes ?Has patient had a PCN reaction causing severe rash involving mucus membranes or skin  necrosis: No ?Has patient had a PCN reaction that required hospitalization No ?Has patient had a PCN reaction occurring within the last 10 years: No ?If all of the above answers are "NO", then may proceed wit

## 2022-01-06 NOTE — Patient Instructions (Signed)
   Chest Wall Pain Chest wall pain is pain in or around the bones and muscles of your chest. Chest wall pain may be caused by: An injury. Coughing a lot. Using your chest and arm muscles too much. Sometimes, the cause may not be known. This pain may take a few weeks or longer to get better. Follow these instructions at home: Managing pain, stiffness, and swelling If told, put ice on the painful area: Put ice in a plastic bag. Place a towel between your skin and the bag. Leave the ice on for 20 minutes, 2-3 times a day.  Activity Rest as told by your doctor. Avoid doing things that cause pain. This includes lifting heavy items. Ask your doctor what activities are safe for you. General instructions  Take over-the-counter and prescription medicines only as told by your doctor. Do not use any products that contain nicotine or tobacco, such as cigarettes, e-cigarettes, and chewing tobacco. If you need help quitting, ask your doctor. Keep all follow-up visits as told by your doctor. This is important. Contact a doctor if: You have a fever. Your chest pain gets worse. You have new symptoms. Get help right away if: You feel sick to your stomach (nauseous) or you throw up (vomit). You feel sweaty or light-headed. You have a cough with mucus from your lungs (sputum) or you cough up blood. You are short of breath. These symptoms may be an emergency. Do not wait to see if the symptoms will go away. Get medical help right away. Call your local emergency services (911 in the [place] Do not drive yourself to the hospital. Summary Chest wall pain is pain in or around the bones and muscles of your chest. It may be treated with ice, rest, and medicines. Your condition may also get better if you avoid doing things that cause pain. Contact a doctor if you have a fever, chest pain that gets worse, or new symptoms. Get help right away if you feel light-headed or you get short of breath. These symptoms  may be an emergency. This information is not intended to replace advice given to you by your health care provider. Make sure you discuss any questions you have with your health care provider. Document Revised: 12/14/2020 Document Reviewed: 11/26/2020 Elsevier Patient Education  2023 Elsevier Inc.  

## 2022-01-11 ENCOUNTER — Encounter: Payer: Self-pay | Admitting: Cardiology

## 2022-01-11 DIAGNOSIS — R072 Precordial pain: Secondary | ICD-10-CM | POA: Insufficient documentation

## 2022-01-11 NOTE — Progress Notes (Signed)
?  ?Cardiology Office Note ? ? ?Date:  01/12/2022  ? ?ID:  Judy Evans, DOB 1970-11-07, MRN [id number] ? ?PCP:  Ivy Lynn, NP  ?Cardiologist:   Minus Breeding, MD ?Referring:  Ivy Lynn, NP ? ?Chief Complaint  ?Patient presents with  ? Chest Pain  ? ? ?  ?History of Present Illness: ?Judy Evans is a 51 y.o. female who presents for evaluation chest pain..  She saw Dr. Bronson Ing last in 2019.  She had a stress test which was low risk.  EF on echo was 65%.   She was referred by Ivy Lynn, NP for evaluation of chest pain.    She also reports she had a catheterization years ago but I do not have these results. ? ?She says she has been having chest pain since 08-28-2023.  Both of her sisters died around that time.  She has been dealing with aging parents.  She has a lot of stress.  She started getting chest discomfort.  This is left chest and midsternal.  It may last for 15 minutes at a time or could last for hours.  It is increasing in frequency and intensity.  It does not happen with physical activity although she is somewhat limited because of back problems and now a left hand problem.  She says it can be 7 out of 10 in intensity.  It is either heavy or pressure-like.  She has some associated nausea.  She does not describe radiation to her neck up to her arms.  She has not been taking anything to try to get rid of this.  Is not positional.  She is not having any increased shortness of breath although she has chronic dyspnea related to COPD and ongoing tobacco abuse.  She has had no new palpitations, presyncope or syncope. ? ? ?Past Medical History:  ?Diagnosis Date  ? Anxiety   ? Aphasia   ? Arthritis   ? COPD (chronic obstructive pulmonary disease) ([place])   ? CRPS (complex regional pain syndrome type I)   ? DDD (degenerative disc disease), cervical   ? Degenerative disc disease at L5-S1 level   ? Fibromyalgia   ? GERD (gastroesophageal reflux disease)   ? IBS (irritable bowel syndrome)    ? Migraines   ? Moody 01/22/2015  ? Psoriasis   ? Scoliosis   ? Spondylolysis   ? ? ?Past Surgical History:  ?Procedure Laterality Date  ? ABDOMINAL HYSTERECTOMY    ? BIOPSY  09/14/2016  ? Procedure: BIOPSY;  Surgeon: Daneil Dolin, MD;  [facility];  Service: Endoscopy;;  gastric ?  ? CARDIAC CATHETERIZATION  2015  ? patient reported it to be normal. "I was dying in pain during procedure".   ? CARPAL TUNNEL RELEASE Right   ? CESAREAN SECTION    ? CHOLECYSTECTOMY    ? COLONOSCOPY  06/2014  ? Marc DeMason: normal  ? DILATION AND CURETTAGE OF UTERUS    ? ESOPHAGOGASTRODUODENOSCOPY  2014/08/27  ? Marc DeMason: Normal  ? ESOPHAGOGASTRODUODENOSCOPY (EGD) WITH PROPOFOL N/A 09/14/2016  ? Dr. Gala Romney: Normal esophagus status post empiric dilation, erosive gastropathy with biopsies showing chronic inactive gastritis, no H pylori.  ? EXTERNAL FIXATION OF FINGER Left 12/01/2021  ? Procedure: LEFT RING DIGIT WIDGET PLACEMENT;  Surgeon: Leanora Cover, MD;  Location: [facility];  Service: Orthopedics;  Laterality: Left;  ? FOOT SURGERY Bilateral   ? removal of heel spurs  ? MALONEY DILATION  09/14/2016  ? Procedure: MALONEY DILATION;  Surgeon: Daneil Dolin, MD;  [facility];  Service: Endoscopy;;  ? rotator cuff surgery Right   ? TONSILLECTOMY    ? ? ? ?Current Outpatient Medications  ?Medication Sig Dispense Refill  ? AJOVY 225 MG/1.5ML SOSY INJECT 1 SYRINGE UNDER THE SKIN EVERY 28 DAYS. 4.5 mL 0  ? albuterol (VENTOLIN HFA) 108 (90 Base) MCG/ACT inhaler USE 2 PUFFS BY MOUTH EVERY 4 HOURS AS NEEDED FOR WHEEZING OR SHORTNESS OF BREATH 18 each 2  ? amitriptyline (ELAVIL) 25 MG tablet Take by mouth.    ? aspirin EC 81 MG tablet Take 1 tablet (81 mg total) by mouth daily. Swallow whole. 90 tablet 3  ? celecoxib (CELEBREX) 200 MG capsule Take by mouth.    ? cetirizine (ZYRTEC) 10 MG tablet Take 10 mg by mouth daily.    ? COSENTYX SENSOREADY, 300 MG, 150 MG/ML SOAJ     ? fluticasone (FLONASE) 50  MCG/ACT nasal spray Administer 2 sprays in each nostril daily.    ? gabapentin (NEURONTIN) 800 MG tablet Take 1 tablet (800 mg total) by mouth 4 (four) times daily. (Needs to be seen before next refill) 120 tablet 2  ? ipratropium-albuterol (DUONEB) 0.5-2.5 (3) MG/3ML SOLN Take 3 mLs by nebulization every 6 (six) hours as needed. 180 mL 0  ? lidocaine (LIDODERM) 5 % Place 1 patch onto the skin daily. Remove & Discard patch within 12 hours or as directed by MD 30 patch 5  ? meclizine (ANTIVERT) 25 MG tablet TAKE 1 TABLET (25 MG TOTAL) BY MOUTH 3 (THREE) TIMES DAILY AS NEEDED FOR DIZZINESS. 90 tablet 1  ? metoprolol tartrate (LOPRESSOR) 100 MG tablet Take one tablet two hours before the test 1 tablet 0  ? nitroGLYCERIN (NITROSTAT) 0.4 MG SL tablet Place 1 tablet (0.4 mg total) under the tongue every 5 (five) minutes as needed for chest pain. 25 tablet 3  ? nystatin ointment (MYCOSTATIN) Apply 1 application topically 2 (two) times daily as needed. 30 g 0  ? omeprazole (PRILOSEC) 40 MG capsule 1 capsule by mouth daily 90 capsule 0  ? triamcinolone cream (KENALOG) 0.1 %     ? triamcinolone ointment (KENALOG) 0.5 % Apply topically 2 (two) times daily.    ? ?No current facility-administered medications for this visit.  ? ? ?Allergies:   Rosuvastatin calcium, Statins, Wellbutrin [bupropion], Hctz [hydrochlorothiazide], Hydrocodone-acetaminophen, Oxycodone, and Penicillins  ? ? ?Social History:  The patient  reports that she has been smoking cigarettes. She has a 45.00 pack-year smoking history. She has never used smokeless tobacco. She reports that she does not drink alcohol and does not use drugs.  ? ?Family History:  The patient's family history includes Arthritis in her maternal grandmother and sister; Asthma in her daughter and son; Atrial fibrillation in her father and mother; CAD (age of onset: 14) in her sister; COPD in her mother; Cancer in her paternal grandfather and paternal grandmother; Cancer (age of onset: 34)  in her maternal grandmother; Dementia in her maternal grandmother; Diabetes in her maternal grandmother, mother, and sister; Early death in her brother; Factor V Leiden deficiency in her sister; Healthy in her father; Lupus in her sister; Osteoarthritis in her mother; Other in her maternal grandfather and paternal grandfather; Psoriasis in her sister.  ? ? ?ROS:  Please see the history of present illness.   Otherwise, review of systems are positive for none.   All other systems are reviewed and negative.  ? ? ?  PHYSICAL EXAM: ?VS:  BP 110/66   Pulse 91   Ht '5\' 2"'$  (1.575 m)   Wt 200 lb (90.7 kg)   SpO2 94%   BMI 36.58 kg/m?  , BMI Body mass index is 36.58 kg/m?. ?GENERAL:  Well appearing ?HEENT:  Pupils equal round and reactive, fundi not visualized, oral mucosa unremarkable ?NECK:  No jugular venous distention, waveform within normal limits, carotid upstroke brisk and symmetric, no bruits, no thyromegaly ?LYMPHATICS:  No cervical, inguinal adenopathy ?LUNGS:  Clear to auscultation bilaterally ?BACK:  No CVA tenderness ?CHEST:  Unremarkable ?HEART:  PMI not displaced or sustained,S1 and S2 within normal limits, no S3, no S4, no clicks, no rubs, no murmurs ?ABD:  Flat, positive bowel sounds normal in frequency in pitch, no bruits, no rebound, no guarding, no midline pulsatile mass, no hepatomegaly, no splenomegaly ?EXT:  2 plus pulses throughout, no edema, no cyanosis no clubbing ?SKIN:  No rashes no nodules ?NEURO:  Cranial nerves II through XII grossly intact, motor grossly intact throughout ?PSYCH:  Cognitively intact, oriented to person place and time ? ? ? ?EKG:  EKG is ordered today. ?The ekg ordered today demonstrates normal sinus rhythm, rate 91, baseline artifact.  Previous EKG done on 01/06/2022 demonstrated no acute ST-T wave changes and was basically normal EKG. ? ? ?Recent Labs: ?No results found for requested labs within last 8760 hours.  ? ? ?Lipid Panel ?   ?Component Value Date/Time  ? CHOL 199  01/22/2017 1057  ? TRIG 187 (H) 01/22/2017 1057  ? HDL 46 01/22/2017 1057  ? CHOLHDL 4.3 01/22/2017 1057  ? LDLCALC 116 (H) 01/22/2017 1057  ? ?  ? ?Wt Readings from Last 3 Encounters:  ?01/12/22 200 lb (90.7 k

## 2022-01-12 ENCOUNTER — Ambulatory Visit (INDEPENDENT_AMBULATORY_CARE_PROVIDER_SITE_OTHER): Payer: Medicare HMO | Admitting: Cardiology

## 2022-01-12 ENCOUNTER — Encounter: Payer: Self-pay | Admitting: Cardiology

## 2022-01-12 VITALS — BP 110/66 | HR 91 | Ht 62.0 in | Wt 200.0 lb

## 2022-01-12 DIAGNOSIS — R072 Precordial pain: Secondary | ICD-10-CM

## 2022-01-12 DIAGNOSIS — E785 Hyperlipidemia, unspecified: Secondary | ICD-10-CM | POA: Diagnosis not present

## 2022-01-12 DIAGNOSIS — I10 Essential (primary) hypertension: Secondary | ICD-10-CM

## 2022-01-12 LAB — BASIC METABOLIC PANEL
BUN/Creatinine Ratio: 20 (ref 9–23)
BUN: 15 mg/dL (ref 6–24)
CO2: 22 mmol/L (ref 20–29)
Calcium: 9.6 mg/dL (ref 8.7–10.2)
Chloride: 104 mmol/L (ref 96–106)
Creatinine, Ser: 0.76 mg/dL (ref 0.57–1.00)
Glucose: 89 mg/dL (ref 70–99)
Potassium: 4.5 mmol/L (ref 3.5–5.2)
Sodium: 139 mmol/L (ref 134–144)
eGFR: 96 mL/min/{1.73_m2} (ref >59)

## 2022-01-12 LAB — CBC
Hematocrit: 48.4 % — ABNORMAL HIGH (ref 34.0–46.6)
Hemoglobin: 16.4 g/dL — ABNORMAL HIGH (ref 11.1–15.9)
MCH: 31.5 pg (ref 26.6–33.0)
MCHC: 33.9 g/dL (ref 31.5–35.7)
MCV: 93 fL (ref 79–97)
Platelets: 219 10*3/uL (ref 150–450)
RBC: 5.2 x10E6/uL (ref 3.77–5.28)
RDW: 12.4 % (ref 11.7–15.4)
WBC: 5.9 10*3/uL (ref 3.4–10.8)

## 2022-01-12 LAB — LIPID PANEL
Chol/HDL Ratio: 3.6 ratio (ref 0.0–4.4)
Cholesterol, Total: 231 mg/dL — ABNORMAL HIGH (ref 100–199)
HDL: 65 mg/dL (ref >39)
LDL Chol Calc (NIH): 148 mg/dL — ABNORMAL HIGH (ref 0–99)
Triglycerides: 100 mg/dL (ref 0–149)
VLDL Cholesterol Cal: 18 mg/dL (ref 5–40)

## 2022-01-12 MED ORDER — NITROGLYCERIN 0.4 MG SL SUBL: 0.4000 mg | SUBLINGUAL_TABLET | SUBLINGUAL | 3 refills | Status: DC | PRN

## 2022-01-12 MED ORDER — ASPIRIN EC 81 MG PO TBEC: 81.0000 mg | DELAYED_RELEASE_TABLET | Freq: Every day | ORAL | 3 refills | Status: DC

## 2022-01-12 MED ORDER — METOPROLOL TARTRATE 100 MG PO TABS: ORAL_TABLET | ORAL | 0 refills | Status: DC

## 2022-01-12 NOTE — Patient Instructions (Addendum)
Medication Instructions:  ?START Aspirin 81 mg once daily ? ?NITROGLYCERIN: Take Nitroglycerin as needed for chest pain: Place one tablet under the tongue as needed every 5 minutes for chest pain. If you have to use three tablets with no relief, please call 911 or go to your closest Emergency Department. ? ?*If you need a refill on your cardiac medications before your next appointment, please call your pharmacy* ? ? ?Lab Work: ?Your provider would like for you to have the following labs today: CBC, BMET and Lipid ? ?If you have labs (blood work) drawn today and your tests are completely normal, you will receive your results only by: ?MyChart Message (if you have MyChart) OR ?A paper copy in the mail ?If you have any lab test that is abnormal or we need to change your treatment, we will call you to review the results. ? ?Follow-Up: ?At [facility], you and your health needs are our priority.  As part of our continuing mission to provide you with exceptional heart care, we have created designated Provider Care Teams.  These Care Teams include your primary Cardiologist (physician) and Advanced Practice Providers (APPs -  Physician Assistants and Nurse Practitioners) who all work together to provide you with the care you need, when you need it. ? ?We recommend signing up for the patient portal called "MyChart".  Sign up information is provided on this After Visit Summary.  MyChart is used to connect with patients for Virtual Visits (Telemedicine).  Patients are able to view lab/test results, encounter notes, upcoming appointments, etc.  Non-urgent messages can be sent to your provider as well.   ?To learn more about what you can do with MyChart, go to [email or web address].   ? ?Your next appointment:   ?Follow up with Dr. Percival Spanish based on the results ? ?Other Instructions ? ? ?Your cardiac CT will be scheduled at one of the below locations:  ? ?[facility] ?[address] ?[place], [place]  [address] ?[phone] ? ?OR ? ?[facility] [address] ?[address] ?[place], [place] [address] ?[phone] ? ?If scheduled at [facility], please arrive at the [facility] and Children's Entrance (Entrance C2) of [facility] 30 minutes prior to test start time. ?You can use the FREE valet parking offered at entrance C (encouraged to control the heart rate for the test)  ?Proceed to the [facility] Radiology Department (first floor) to check-in and test prep. ? ?All radiology patients and guests should use entrance C2 at [facility], accessed from [facility], even though the hospital's physical address listed is [address] ? ? ? ?If scheduled at [facility], please arrive 15 mins early for check-in and test prep. ? ?Please follow these instructions carefully (unless otherwise directed): ? ?On the Night Before the Test: ?Be sure to Drink plenty of water. ?Do not consume any caffeinated/decaffeinated beverages or chocolate 12 hours prior to your test. ?Do not take any antihistamines 12 hours prior to your test. ? ?On the Day of the Test: ?Drink plenty of water until 1 hour prior to the test. ?Do not eat any food 4 hours prior to the test. ?You may take your regular medications prior to the test.  ?Take metoprolol (Lopressor) two hours prior to test. ?FEMALES- please wear underwire-free bra if available, avoid dresses & tight clothing ? ?     ?After the Test: ?Drink plenty of water. ?After receiving IV contrast, you  may experience a mild flushed feeling. This is normal. ?On occasion, you may experience a mild rash up to 24 hours after the test. This is not dangerous. If this occurs, you can take Benadryl 25 mg and increase your fluid intake. ?If you experience trouble breathing, this can be serious. If it is severe call 911 IMMEDIATELY. If it is mild, please call our office. ?If you take any of these  medications: Glipizide/Metformin, Avandament, Glucavance, please do not take 48 hours after completing test unless otherwise instructed. ? ?We will call to schedule your test 2-4 weeks out understanding that some insurance companies will need an authorization prior to the service being performed.  ? ?For non-scheduling related questions, please contact the cardiac imaging nurse navigator should you have any questions/concerns: ?Marchia Bond, Cardiac Imaging Nurse Navigator ?Gordy Clement, Cardiac Imaging Nurse Navigator ?[facility] ?Direct Office Dial: [phone]  ? ?For scheduling needs, including cancellations and rescheduling, please call [place], [phone]. ? ? ? ?

## 2022-01-17 ENCOUNTER — Encounter: Payer: Self-pay | Admitting: *Deleted

## 2022-01-20 ENCOUNTER — Other Ambulatory Visit: Payer: Self-pay | Admitting: Orthopedic Surgery

## 2022-01-25 ENCOUNTER — Other Ambulatory Visit: Payer: Self-pay | Admitting: Nurse Practitioner

## 2022-01-25 ENCOUNTER — Ambulatory Visit
Admission: RE | Admit: 2022-01-25 | Discharge: 2022-01-25 | Disposition: A | Discharge status: Home / Self Care | Payer: Medicare HMO | Source: Ambulatory Visit | Attending: Nurse Practitioner | Admitting: Nurse Practitioner

## 2022-01-25 DIAGNOSIS — Z1231 Encounter for screening mammogram for malignant neoplasm of breast: Secondary | ICD-10-CM | POA: Diagnosis not present

## 2022-01-25 DIAGNOSIS — J449 Chronic obstructive pulmonary disease, unspecified: Secondary | ICD-10-CM

## 2022-01-31 ENCOUNTER — Telehealth: Payer: Self-pay | Admitting: Cardiology

## 2022-01-31 NOTE — Telephone Encounter (Signed)
? ?  Pre-operative Risk Assessment  ?  ?Patient Name: Judy Evans  ?DOB: November 25, 1970 ?MRN: [id number]  ? ?  ? ?Request for Surgical Clearance   ? ?Procedure:   Left Ring Removal Digit Widgit  Reconstruction A2 Pulley Penning Proxamol Interphalangeal Joint ? ?Date of Surgery:  Clearance 02/09/22                              ?   ?Surgeon:  Dr. Leanora Cover ?Surgeon's Group or Practice Name:  [facility] ?Phone number:  [phone] ?Fax number:  [phone] ?  ?Type of Clearance Requested:   ?- Pharmacy:  Hold Aspirin   ?  ?Type of Anesthesia:   Choice ?  ?Additional requests/questions:  Please advise surgeon/provider what medications should be held. ? ?Signed, ?Belisicia T Harris   ?01/31/2022, 3:30 PM  ? ?

## 2022-01-31 NOTE — Telephone Encounter (Signed)
Coronary CT scheduled for 5/12 ?

## 2022-02-02 ENCOUNTER — Telehealth (HOSPITAL_COMMUNITY): Payer: Self-pay | Admitting: *Deleted

## 2022-02-02 NOTE — Telephone Encounter (Signed)
Reaching out to patient to offer assistance regarding upcoming cardiac imaging study; pt verbalizes understanding of appt date/time, parking situation and where to check in, pre-test NPO status and medications ordered, and verified current allergies; name and call back number provided for further questions should they arise  Esta Carmon RN Navigator [facility] [phone] office [phone] cell  Patient to take 100mg metoprolol tartrate two hours prior to her cardiac CT scan. She is aware to arrive at 3:30pm. 

## 2022-02-03 ENCOUNTER — Ambulatory Visit (HOSPITAL_COMMUNITY)
Admission: RE | Admit: 2022-02-03 | Discharge: 2022-02-03 | Disposition: A | Discharge status: Home / Self Care | Payer: Medicare HMO | Source: Ambulatory Visit | Attending: Cardiology | Admitting: Cardiology

## 2022-02-03 DIAGNOSIS — R072 Precordial pain: Secondary | ICD-10-CM | POA: Diagnosis not present

## 2022-02-03 MED ORDER — IOHEXOL 350 MG/ML SOLN
100.0000 mL | Freq: Once | INTRAVENOUS | Status: AC | PRN
  Administered 2022-02-03: 100 mL via INTRAVENOUS

## 2022-02-03 MED ORDER — NITROGLYCERIN 0.4 MG SL SUBL
0.8000 mg | SUBLINGUAL_TABLET | Freq: Once | SUBLINGUAL | Status: AC
  Administered 2022-02-03: 0.8 mg via SUBLINGUAL

## 2022-02-03 MED ORDER — NITROGLYCERIN 0.4 MG SL SUBL
SUBLINGUAL_TABLET | SUBLINGUAL | Status: AC
  Filled 2022-02-03: qty 2

## 2022-02-06 ENCOUNTER — Encounter (HOSPITAL_BASED_OUTPATIENT_CLINIC_OR_DEPARTMENT_OTHER): Payer: Self-pay | Admitting: Orthopedic Surgery

## 2022-02-06 ENCOUNTER — Other Ambulatory Visit: Payer: Self-pay

## 2022-02-06 NOTE — Progress Notes (Signed)
Pt stated she is currently having nasal congestion/burning, coughing, and wheezing that started approximately 1 week ago. Chart reviewed with Dr. Doroteo Glassman, anesthesiologist. Per aforesaid, pt must see her PCP to be evaluated prior to sx. Pt may continue with surgery as planned if seen by PCP and no antibiotics are needed for these respiratory symptoms. Otherwise, surgery must be postponed. Judy Evans, at Dr. Levell July office notified of this and states she will notify patient. ?

## 2022-02-07 ENCOUNTER — Ambulatory Visit (INDEPENDENT_AMBULATORY_CARE_PROVIDER_SITE_OTHER): Payer: Medicare HMO | Admitting: Nurse Practitioner

## 2022-02-07 ENCOUNTER — Encounter: Payer: Self-pay | Admitting: Nurse Practitioner

## 2022-02-07 ENCOUNTER — Telehealth: Payer: Self-pay | Admitting: Cardiology

## 2022-02-07 VITALS — BP 136/87 | HR 88 | Temp 97.6°F | Resp 16 | Ht 62.0 in | Wt 201.0 lb

## 2022-02-07 DIAGNOSIS — H9201 Otalgia, right ear: Secondary | ICD-10-CM | POA: Diagnosis not present

## 2022-02-07 DIAGNOSIS — J0110 Acute frontal sinusitis, unspecified: Secondary | ICD-10-CM

## 2022-02-07 DIAGNOSIS — J449 Chronic obstructive pulmonary disease, unspecified: Secondary | ICD-10-CM | POA: Diagnosis not present

## 2022-02-07 MED ORDER — ALBUTEROL SULFATE HFA 108 (90 BASE) MCG/ACT IN AERS
INHALATION_SPRAY | RESPIRATORY_TRACT | 3 refills | Status: DC
Start: 2022-02-07 — End: 2023-07-27

## 2022-02-07 MED ORDER — DOXYCYCLINE HYCLATE 100 MG PO TABS: 100.0000 mg | ORAL_TABLET | Freq: Two times a day (BID) | ORAL | 0 refills | Status: DC

## 2022-02-07 NOTE — Telephone Encounter (Signed)
Asked to clear the patient from a cardiac standpoint.  She had a CT with no coronary disease.  No active cardiac issues.  No cardiac contraindication for planned surgery.  No further cardiac work up.  OK to hold ASA as needed.   ?

## 2022-02-07 NOTE — Patient Instructions (Signed)

## 2022-02-07 NOTE — Progress Notes (Signed)
? ?Acute Office Visit ? ?Subjective:  ? ?  ?Patient ID: Judy Evans, female    DOB: 1971/05/12, 51 y.o.   MRN: [id number] ? ?Chief Complaint  ?Patient presents with  ? Nasal Congestion  ?  X 1 week, no fever   ? ? ?Sinusitis ?This is a new problem. The current episode started in the past 7 days. The problem is unchanged. There has been no fever. The pain is moderate. Associated symptoms include congestion, ear pain and sinus pressure. Pertinent negatives include no chills, headaches or sore throat. Past treatments include nothing.  ?Otalgia  ?There is pain in the right ear. This is a new problem. The current episode started yesterday. The problem occurs constantly. The problem has been unchanged. There has been no fever. Pertinent negatives include no abdominal pain, ear discharge, headaches, hearing loss, rash or sore throat. She has tried nothing for the symptoms.  ? ? ?Review of Systems  ?Constitutional: Negative.  Negative for chills and fever.  ?HENT:  Positive for congestion, ear pain and sinus pressure. Negative for ear discharge, hearing loss and sore throat.   ?Respiratory: Negative.    ?Cardiovascular: Negative.   ?Gastrointestinal:  Negative for abdominal pain.  ?Skin: Negative.  Negative for rash.  ?Neurological:  Negative for headaches.  ?All other systems reviewed and are negative. ? ? ?   ?Objective:  ?  ?BP 136/87   Pulse 88   Temp 97.6 ?F (36.4 ?C)   Resp 16   Ht '5\' 2"'$  (1.575 m)   Wt 201 lb (91.2 kg)   SpO2 93%   BMI 36.76 kg/m?  ?BP Readings from Last 3 Encounters:  ?02/07/22 136/87  ?02/03/22 (!) 107/49  ?01/12/22 110/66  ? ?Wt Readings from Last 3 Encounters:  ?02/07/22 201 lb (91.2 kg)  ?01/12/22 200 lb (90.7 kg)  ?01/06/22 198 lb (89.8 kg)  ? ?  ? ?Physical Exam ?Vitals and nursing note reviewed.  ?Constitutional:   ?   Appearance: Normal appearance. She is obese.  ?HENT:  ?   Head: Normocephalic.  ?   Right Ear: External ear normal.  ?   Left Ear: External ear normal. There is  impacted cerumen.  ?   Nose: Congestion present.  ?   Mouth/Throat:  ?   Pharynx: Oropharynx is clear.  ?Eyes:  ?   Conjunctiva/sclera: Conjunctivae normal.  ?Cardiovascular:  ?   Rate and Rhythm: Normal rate and regular rhythm.  ?   Pulses: Normal pulses.  ?   Heart sounds: Normal heart sounds.  ?Pulmonary:  ?   Effort: Pulmonary effort is normal.  ?   Breath sounds: Normal breath sounds.  ?Abdominal:  ?   General: Bowel sounds are normal.  ?Skin: ?   Findings: No rash.  ?Neurological:  ?   General: No focal deficit present.  ?   Mental Status: She is alert and oriented to person, place, and time.  ? ? ?No results found for any visits on 02/07/22. ? ? ?   ?Assessment & Plan:  ?Take meds as prescribed ?- Use a cool mist humidifier  ?-Use saline nose sprays frequently ?-Force fluids ?-For fever or aches or pains- take Tylenol or ibuprofen. ?-If symptoms do not improve, she may need to be COVID tested to rule this out ?Follow up with worsening unresolved symptoms  ? ? ?Problem List Items Addressed This Visit   ? ?  ? Respiratory  ? COPD (chronic obstructive pulmonary disease) ([place])  ? Relevant Medications  ?  albuterol (VENTOLIN HFA) 108 (90 Base) MCG/ACT inhaler  ? ?Other Visit Diagnoses   ? ? Subacute frontal sinusitis    -  Primary  ? Relevant Medications  ? doxycycline (VIBRA-TABS) 100 MG tablet  ? Right ear pain      ? Relevant Medications  ? doxycycline (VIBRA-TABS) 100 MG tablet  ? ?  ? ? ?Meds ordered this encounter  ?Medications  ? doxycycline (VIBRA-TABS) 100 MG tablet  ?  Sig: Take 1 tablet (100 mg total) by mouth 2 (two) times daily.  ?  Dispense:  14 tablet  ?  Refill:  0  ?  Order Specific Question:   Supervising Provider  ?  AnswerClaretta Fraise [[id number]]  ? albuterol (VENTOLIN HFA) 108 (90 Base) MCG/ACT inhaler  ?  Sig: USE 2 PUFFS BY MOUTH EVERY 4 HOURS AS NEEDED FOR WHEEZING OR SHORTNESS OF BREATH  ?  Dispense:  18 each  ?  Refill:  3  ?  DX Code Needed  RX IS OUT OF REFILLS.  ?  Order Specific  Question:   Supervising Provider  ?  AnswerClaretta Fraise [[id number]]  ? ? ?Return if symptoms worsen or fail to improve. ? ?Ivy Lynn, NP ? ? ?

## 2022-02-07 NOTE — Telephone Encounter (Signed)
? ? ?  Patient Name: Judy Evans  ?DOB: 06/23/1971 ?MRN: [id number] ? ?Primary Cardiologist: Minus Breeding, MD ? ?Chart reviewed as part of pre-operative protocol coverage. Given past medical history and time since last visit, based on ACC/AHA guidelines, ADALYNE LOVICK would be at acceptable risk for the planned procedure without further cardiovascular testing. Per Dr. Percival Spanish, primary cardiologist "she had a CT with no coronary disease.  No active cardiac issues.  No cardiac contraindication for planned surgery. No further cardiac work up.  OK to hold ASA as needed."  ? ?Patient may hold aspirin for 5-7 days prior to procedure.  ? ?The patient was advised that if she develops new symptoms prior to surgery to contact our office to arrange for a follow-up visit, and she verbalized understanding. ? ?I will route this recommendation to the requesting party via Epic fax function and remove from pre-op pool. ? ?Please call with questions. ? ?Lenna Sciara, NP ?02/07/2022, 8:37 AM  ?

## 2022-02-08 ENCOUNTER — Encounter: Payer: Self-pay | Admitting: *Deleted

## 2022-02-08 DIAGNOSIS — L570 Actinic keratosis: Secondary | ICD-10-CM | POA: Diagnosis not present

## 2022-02-08 DIAGNOSIS — L409 Psoriasis, unspecified: Secondary | ICD-10-CM | POA: Diagnosis not present

## 2022-02-08 DIAGNOSIS — Z1283 Encounter for screening for malignant neoplasm of skin: Secondary | ICD-10-CM | POA: Diagnosis not present

## 2022-02-09 ENCOUNTER — Ambulatory Visit (HOSPITAL_BASED_OUTPATIENT_CLINIC_OR_DEPARTMENT_OTHER): Admission: RE | Admit: 2022-02-09 | Payer: Medicare HMO | Source: Home / Self Care | Admitting: Orthopedic Surgery

## 2022-02-09 DIAGNOSIS — Z01818 Encounter for other preprocedural examination: Secondary | ICD-10-CM

## 2022-02-09 SURGERY — REMOVAL, HARDWARE
Anesthesia: Choice | Site: Finger | Laterality: Left

## 2022-02-28 ENCOUNTER — Encounter: Payer: Self-pay | Admitting: *Deleted

## 2022-03-02 DIAGNOSIS — M24542 Contracture, left hand: Secondary | ICD-10-CM | POA: Diagnosis not present

## 2022-03-02 DIAGNOSIS — Z9889 Other specified postprocedural states: Secondary | ICD-10-CM | POA: Diagnosis not present

## 2022-03-10 DIAGNOSIS — M79645 Pain in left finger(s): Secondary | ICD-10-CM | POA: Diagnosis not present

## 2022-03-10 DIAGNOSIS — M25642 Stiffness of left hand, not elsewhere classified: Secondary | ICD-10-CM | POA: Diagnosis not present

## 2022-03-10 DIAGNOSIS — S63408A Traumatic rupture of unspecified ligament of other finger at metacarpophalangeal and interphalangeal joint, initial encounter: Secondary | ICD-10-CM | POA: Diagnosis not present

## 2022-03-15 ENCOUNTER — Encounter: Payer: Self-pay | Admitting: Podiatry

## 2022-03-15 ENCOUNTER — Ambulatory Visit: Payer: Medicare HMO

## 2022-03-15 ENCOUNTER — Ambulatory Visit (INDEPENDENT_AMBULATORY_CARE_PROVIDER_SITE_OTHER): Payer: Medicare HMO | Admitting: Podiatry

## 2022-03-15 DIAGNOSIS — M79674 Pain in right toe(s): Secondary | ICD-10-CM | POA: Diagnosis not present

## 2022-03-15 DIAGNOSIS — B351 Tinea unguium: Secondary | ICD-10-CM | POA: Diagnosis not present

## 2022-03-15 DIAGNOSIS — L84 Corns and callosities: Secondary | ICD-10-CM | POA: Diagnosis not present

## 2022-03-15 DIAGNOSIS — G609 Hereditary and idiopathic neuropathy, unspecified: Secondary | ICD-10-CM

## 2022-03-15 DIAGNOSIS — M79675 Pain in left toe(s): Secondary | ICD-10-CM

## 2022-03-24 DIAGNOSIS — M25642 Stiffness of left hand, not elsewhere classified: Secondary | ICD-10-CM | POA: Diagnosis not present

## 2022-03-24 DIAGNOSIS — M79645 Pain in left finger(s): Secondary | ICD-10-CM | POA: Diagnosis not present

## 2022-03-24 DIAGNOSIS — S63408A Traumatic rupture of unspecified ligament of other finger at metacarpophalangeal and interphalangeal joint, initial encounter: Secondary | ICD-10-CM | POA: Diagnosis not present
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
## 2022-03-31 ENCOUNTER — Other Ambulatory Visit: Payer: Self-pay | Admitting: Nurse Practitioner

## 2022-03-31 DIAGNOSIS — K219 Gastro-esophageal reflux disease without esophagitis: Secondary | ICD-10-CM

## 2022-04-02 ENCOUNTER — Other Ambulatory Visit: Payer: Self-pay | Admitting: Nurse Practitioner

## 2022-04-02 DIAGNOSIS — J449 Chronic obstructive pulmonary disease, unspecified: Secondary | ICD-10-CM

## 2022-04-07 DIAGNOSIS — S63408A Traumatic rupture of unspecified ligament of other finger at metacarpophalangeal and interphalangeal joint, initial encounter: Secondary | ICD-10-CM | POA: Diagnosis not present

## 2022-04-19 DIAGNOSIS — M65331 Trigger finger, right middle finger: Secondary | ICD-10-CM | POA: Diagnosis not present

## 2022-04-19 DIAGNOSIS — M25642 Stiffness of left hand, not elsewhere classified: Secondary | ICD-10-CM | POA: Diagnosis not present

## 2022-04-19 DIAGNOSIS — S63408A Traumatic rupture of unspecified ligament of other finger at metacarpophalangeal and interphalangeal joint, initial encounter: Secondary | ICD-10-CM | POA: Diagnosis not present

## 2022-04-19 DIAGNOSIS — M79645 Pain in left finger(s): Secondary | ICD-10-CM | POA: Diagnosis not present

## 2022-04-19 DIAGNOSIS — G90512 Complex regional pain syndrome I of left upper limb: Secondary | ICD-10-CM | POA: Diagnosis not present

## 2022-04-26 DIAGNOSIS — M65331 Trigger finger, right middle finger: Secondary | ICD-10-CM | POA: Diagnosis not present

## 2022-04-26 DIAGNOSIS — M79602 Pain in left arm: Secondary | ICD-10-CM | POA: Diagnosis not present

## 2022-04-26 DIAGNOSIS — M79645 Pain in left finger(s): Secondary | ICD-10-CM | POA: Diagnosis not present

## 2022-04-26 DIAGNOSIS — S52502D Unspecified fracture of the lower end of left radius, subsequent encounter for closed fracture with routine healing: Secondary | ICD-10-CM | POA: Diagnosis not present

## 2022-04-26 DIAGNOSIS — M25642 Stiffness of left hand, not elsewhere classified: Secondary | ICD-10-CM | POA: Diagnosis not present

## 2022-04-26 DIAGNOSIS — S63408A Traumatic rupture of unspecified ligament of other finger at metacarpophalangeal and interphalangeal joint, initial encounter: Secondary | ICD-10-CM | POA: Diagnosis not present

## 2022-04-26 DIAGNOSIS — G90512 Complex regional pain syndrome I of left upper limb: Secondary | ICD-10-CM | POA: Diagnosis not present

## 2022-04-26 DIAGNOSIS — M25632 Stiffness of left wrist, not elsewhere classified: Secondary | ICD-10-CM | POA: Diagnosis not present

## 2022-05-10 DIAGNOSIS — M25642 Stiffness of left hand, not elsewhere classified: Secondary | ICD-10-CM | POA: Diagnosis not present

## 2022-05-10 DIAGNOSIS — M25632 Stiffness of left wrist, not elsewhere classified: Secondary | ICD-10-CM | POA: Diagnosis not present

## 2022-05-10 DIAGNOSIS — M65331 Trigger finger, right middle finger: Secondary | ICD-10-CM | POA: Diagnosis not present

## 2022-05-10 DIAGNOSIS — M79645 Pain in left finger(s): Secondary | ICD-10-CM | POA: Diagnosis not present

## 2022-05-10 DIAGNOSIS — S63408A Traumatic rupture of unspecified ligament of other finger at metacarpophalangeal and interphalangeal joint, initial encounter: Secondary | ICD-10-CM | POA: Diagnosis not present

## 2022-05-10 DIAGNOSIS — G90512 Complex regional pain syndrome I of left upper limb: Secondary | ICD-10-CM | POA: Diagnosis not present

## 2022-05-15 DIAGNOSIS — M25642 Stiffness of left hand, not elsewhere classified: Secondary | ICD-10-CM | POA: Diagnosis not present

## 2022-05-15 DIAGNOSIS — S63408A Traumatic rupture of unspecified ligament of other finger at metacarpophalangeal and interphalangeal joint, initial encounter: Secondary | ICD-10-CM | POA: Diagnosis not present

## 2022-05-24 DIAGNOSIS — S63408A Traumatic rupture of unspecified ligament of other finger at metacarpophalangeal and interphalangeal joint, initial encounter: Secondary | ICD-10-CM | POA: Diagnosis not present

## 2022-05-24 DIAGNOSIS — M25642 Stiffness of left hand, not elsewhere classified: Secondary | ICD-10-CM | POA: Diagnosis not present

## 2022-06-02 DIAGNOSIS — S63408A Traumatic rupture of unspecified ligament of other finger at metacarpophalangeal and interphalangeal joint, initial encounter: Secondary | ICD-10-CM | POA: Diagnosis not present

## 2022-06-02 DIAGNOSIS — M25642 Stiffness of left hand, not elsewhere classified: Secondary | ICD-10-CM | POA: Diagnosis not present

## 2022-06-05 ENCOUNTER — Other Ambulatory Visit: Payer: Self-pay | Admitting: Neurology

## 2022-06-05 ENCOUNTER — Ambulatory Visit (INDEPENDENT_AMBULATORY_CARE_PROVIDER_SITE_OTHER): Payer: Medicare HMO | Admitting: Neurology

## 2022-06-05 ENCOUNTER — Encounter: Payer: Self-pay | Admitting: Neurology

## 2022-06-05 VITALS — BP 127/75 | HR 87 | Ht 62.0 in | Wt 195.0 lb

## 2022-06-05 DIAGNOSIS — M5412 Radiculopathy, cervical region: Secondary | ICD-10-CM

## 2022-06-05 DIAGNOSIS — M542 Cervicalgia: Secondary | ICD-10-CM

## 2022-06-05 DIAGNOSIS — M25642 Stiffness of left hand, not elsewhere classified: Secondary | ICD-10-CM | POA: Diagnosis not present

## 2022-06-05 DIAGNOSIS — G4489 Other headache syndrome: Secondary | ICD-10-CM

## 2022-06-05 DIAGNOSIS — G43709 Chronic migraine without aura, not intractable, without status migrainosus: Secondary | ICD-10-CM | POA: Diagnosis not present

## 2022-06-05 DIAGNOSIS — M79662 Pain in left lower leg: Secondary | ICD-10-CM

## 2022-06-05 DIAGNOSIS — M797 Fibromyalgia: Secondary | ICD-10-CM | POA: Diagnosis not present

## 2022-06-05 DIAGNOSIS — S63408A Traumatic rupture of unspecified ligament of other finger at metacarpophalangeal and interphalangeal joint, initial encounter: Secondary | ICD-10-CM | POA: Diagnosis not present

## 2022-06-05 DIAGNOSIS — M79645 Pain in left finger(s): Secondary | ICD-10-CM | POA: Diagnosis not present

## 2022-06-05 MED ORDER — GABAPENTIN 800 MG PO TABS: 800.0000 mg | ORAL_TABLET | Freq: Four times a day (QID) | ORAL | 3 refills | Status: DC

## 2022-06-05 MED ORDER — AMITRIPTYLINE HCL 25 MG PO TABS: ORAL_TABLET | ORAL | 11 refills | Status: DC

## 2022-06-05 MED ORDER — AJOVY 225 MG/1.5ML ~~LOC~~ SOSY: PREFILLED_SYRINGE | SUBCUTANEOUS | 3 refills | Status: DC
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
## 2022-06-07 DIAGNOSIS — Z85820 Personal history of malignant melanoma of skin: Secondary | ICD-10-CM | POA: Diagnosis not present

## 2022-06-07 DIAGNOSIS — Z79899 Other long term (current) drug therapy: Secondary | ICD-10-CM | POA: Diagnosis not present

## 2022-06-07 DIAGNOSIS — L409 Psoriasis, unspecified: Secondary | ICD-10-CM | POA: Diagnosis not present

## 2022-06-12 DIAGNOSIS — S63408A Traumatic rupture of unspecified ligament of other finger at metacarpophalangeal and interphalangeal joint, initial encounter: Secondary | ICD-10-CM | POA: Diagnosis not present

## 2022-06-12 DIAGNOSIS — M24542 Contracture, left hand: Secondary | ICD-10-CM | POA: Diagnosis not present

## 2022-06-15 DIAGNOSIS — M25642 Stiffness of left hand, not elsewhere classified: Secondary | ICD-10-CM | POA: Diagnosis not present

## 2022-06-15 DIAGNOSIS — S63408A Traumatic rupture of unspecified ligament of other finger at metacarpophalangeal and interphalangeal joint, initial encounter: Secondary | ICD-10-CM | POA: Diagnosis not present

## 2022-06-21 ENCOUNTER — Encounter: Payer: Self-pay | Admitting: Podiatry

## 2022-06-21 ENCOUNTER — Ambulatory Visit (INDEPENDENT_AMBULATORY_CARE_PROVIDER_SITE_OTHER): Payer: Medicare HMO | Admitting: Podiatry

## 2022-06-21 DIAGNOSIS — G609 Hereditary and idiopathic neuropathy, unspecified: Secondary | ICD-10-CM

## 2022-06-21 DIAGNOSIS — M79674 Pain in right toe(s): Secondary | ICD-10-CM | POA: Diagnosis not present

## 2022-06-21 DIAGNOSIS — M79675 Pain in left toe(s): Secondary | ICD-10-CM | POA: Diagnosis not present

## 2022-06-21 DIAGNOSIS — B351 Tinea unguium: Secondary | ICD-10-CM

## 2022-06-21 DIAGNOSIS — L84 Corns and callosities: Secondary | ICD-10-CM

## 2022-06-21 NOTE — Progress Notes (Addendum)
  Subjective:  Patient ID: Judy Evans, female    DOB: Jan 09, 1971,  MRN: [id number]  Judy Evans presents to clinic today for  Chief Complaint  Patient presents with   Nail Problem    Routine foot care PCP-[place] PCP VST- couple months ago   Patient has h/o peripheral neuropathy, CPRS type 1 with calluses b/l.  PCP is Ivy Lynn, NP , and last visit was July, 2023.  Allergies  Allergen Reactions   Rosuvastatin Calcium Swelling    Ends up in the hospital   Statins Swelling    Ends up in the hospital   Wellbutrin [Bupropion]     suicidal   Hctz [Hydrochlorothiazide] Nausea And Vomiting and Rash   Hydrocodone-Acetaminophen Rash   Oxycodone Rash   Penicillins Nausea And Vomiting and Rash    Has patient had a PCN reaction causing immediate rash, facial/tongue/throat swelling, SOB or lightheadedness with hypotension: Yes Has patient had a PCN reaction causing severe rash involving mucus membranes or skin necrosis: No Has patient had a PCN reaction that required hospitalization No Has patient had a PCN reaction occurring within the last 10 years: No If all of the above answers are "NO", then may proceed with Cephalosporin use.    Review of Systems: Negative except as noted in the HPI.  Objective: No changes noted in today's physical examination. Judy Evans is a pleasant 51 y.o. female in NAD. AAO x 3. Neurovascular status unchanged b/l.  Capillary refill time to digits immediate b/l. Palpable DP pulses b/l. Palpable PT pulses b/l. Pedal hair present b/l. Skin temperature gradient within normal limits b/l.   Pt has subjective symptoms of neuropathy. Protective sensation intact 5/5 intact bilaterally with 10g monofilament b/l. Vibratory sensation intact b/l. Proprioception intact bilaterally. Babinski reflex negative b/l. Clonus negative b/l.  Dermatological:  Pedal skin with normal turgor, texture and tone bilaterally. No open wounds bilaterally. No  interdigital macerations bilaterally. Toenails 1-5 b/l elongated, discolored, dystrophic, thickened, crumbly with subungual debris and tenderness to dorsal palpation. Incurvated nailplate medial border(s) R hallux.  Nail border hypertrophy minimal. There is tenderness to palpation. Sign(s) of infection: no clinical signs of infection noted on examination today. Hyperkeratotic lesion(s) submet head 5 b/l.  No erythema, no edema, no drainage, no fluctuance.  Musculoskeletal:  Normal muscle strength 5/5 to all lower extremity muscle groups bilaterally. No gross bony deformities bilaterally. No pain crepitus or joint limitation noted with ROM b/l. Patient ambulates independent of any assistive aids.   Assessment/Plan: 1. Pain due to onychomycosis of toenails of both feet   2. Callus   3. Neuropathy, peripheral, idiopathic     -Patient was evaluated and treated. All patient's and/or POA's questions/concerns answered on today's visit. -Medicaid ABN signed for services of paring of corn(s)/callus(es)/porokeratos(es) today. Copy in patient chart. -Toenails 1-5 b/l were debrided in length and girth with sterile nail nippers and dremel without iatrogenic bleeding.  -Callus(es) submet head 5 b/l pared utilizing sterile scalpel blade without complication or incident. Total number debrided =2. -Patient/POA to call should there be question/concern in the interim.   Return in about 3 months (around 09/20/2022).  Marzetta Board, DPM

## 2022-06-23 DIAGNOSIS — S63408A Traumatic rupture of unspecified ligament of other finger at metacarpophalangeal and interphalangeal joint, initial encounter: Secondary | ICD-10-CM | POA: Diagnosis not present

## 2022-06-23 DIAGNOSIS — M25642 Stiffness of left hand, not elsewhere classified: Secondary | ICD-10-CM | POA: Diagnosis not present

## 2022-06-26 ENCOUNTER — Other Ambulatory Visit: Payer: Self-pay

## 2022-06-26 MED ORDER — AJOVY 225 MG/1.5ML ~~LOC~~ SOSY: PREFILLED_SYRINGE | SUBCUTANEOUS | 3 refills | Status: DC

## 2022-06-29 DIAGNOSIS — S63408A Traumatic rupture of unspecified ligament of other finger at metacarpophalangeal and interphalangeal joint, initial encounter: Secondary | ICD-10-CM | POA: Diagnosis not present

## 2022-06-29 DIAGNOSIS — M79645 Pain in left finger(s): Secondary | ICD-10-CM | POA: Diagnosis not present

## 2022-06-29 DIAGNOSIS — M25642 Stiffness of left hand, not elsewhere classified: Secondary | ICD-10-CM | POA: Diagnosis not present

## 2022-07-07 DIAGNOSIS — M25642 Stiffness of left hand, not elsewhere classified: Secondary | ICD-10-CM | POA: Diagnosis not present

## 2022-07-21 DIAGNOSIS — M65331 Trigger finger, right middle finger: Secondary | ICD-10-CM | POA: Diagnosis not present

## 2022-07-21 DIAGNOSIS — M25642 Stiffness of left hand, not elsewhere classified: Secondary | ICD-10-CM | POA: Diagnosis not present

## 2022-07-21 DIAGNOSIS — M25632 Stiffness of left wrist, not elsewhere classified: Secondary | ICD-10-CM | POA: Diagnosis not present

## 2022-07-21 DIAGNOSIS — M79602 Pain in left arm: Secondary | ICD-10-CM | POA: Diagnosis not present

## 2022-07-21 DIAGNOSIS — S52502D Unspecified fracture of the lower end of left radius, subsequent encounter for closed fracture with routine healing: Secondary | ICD-10-CM | POA: Diagnosis not present

## 2022-07-21 DIAGNOSIS — M79645 Pain in left finger(s): Secondary | ICD-10-CM | POA: Diagnosis not present

## 2022-07-21 DIAGNOSIS — G90512 Complex regional pain syndrome I of left upper limb: Secondary | ICD-10-CM | POA: Diagnosis not present

## 2022-07-21 DIAGNOSIS — S63408A Traumatic rupture of unspecified ligament of other finger at metacarpophalangeal and interphalangeal joint, initial encounter: Secondary | ICD-10-CM | POA: Diagnosis not present

## 2022-07-24 DIAGNOSIS — M25642 Stiffness of left hand, not elsewhere classified: Secondary | ICD-10-CM | POA: Diagnosis not present

## 2022-07-24 DIAGNOSIS — S63408A Traumatic rupture of unspecified ligament of other finger at metacarpophalangeal and interphalangeal joint, initial encounter: Secondary | ICD-10-CM | POA: Diagnosis not present

## 2022-07-28 ENCOUNTER — Other Ambulatory Visit: Payer: Self-pay | Admitting: Nurse Practitioner

## 2022-07-28 DIAGNOSIS — M79645 Pain in left finger(s): Secondary | ICD-10-CM | POA: Diagnosis not present

## 2022-07-28 DIAGNOSIS — G90512 Complex regional pain syndrome I of left upper limb: Secondary | ICD-10-CM | POA: Diagnosis not present

## 2022-07-28 DIAGNOSIS — M25642 Stiffness of left hand, not elsewhere classified: Secondary | ICD-10-CM | POA: Diagnosis not present

## 2022-07-28 DIAGNOSIS — K219 Gastro-esophageal reflux disease without esophagitis: Secondary | ICD-10-CM

## 2022-07-28 DIAGNOSIS — S63408A Traumatic rupture of unspecified ligament of other finger at metacarpophalangeal and interphalangeal joint, initial encounter: Secondary | ICD-10-CM | POA: Diagnosis not present

## 2022-08-04 DIAGNOSIS — M65331 Trigger finger, right middle finger: Secondary | ICD-10-CM | POA: Diagnosis not present

## 2022-08-04 DIAGNOSIS — M79602 Pain in left arm: Secondary | ICD-10-CM | POA: Diagnosis not present

## 2022-08-04 DIAGNOSIS — G90512 Complex regional pain syndrome I of left upper limb: Secondary | ICD-10-CM | POA: Diagnosis not present

## 2022-08-04 DIAGNOSIS — M25632 Stiffness of left wrist, not elsewhere classified: Secondary | ICD-10-CM | POA: Diagnosis not present

## 2022-08-04 DIAGNOSIS — M25642 Stiffness of left hand, not elsewhere classified: Secondary | ICD-10-CM | POA: Diagnosis not present

## 2022-08-04 DIAGNOSIS — S52502D Unspecified fracture of the lower end of left radius, subsequent encounter for closed fracture with routine healing: Secondary | ICD-10-CM | POA: Diagnosis not present

## 2022-08-04 DIAGNOSIS — S63408A Traumatic rupture of unspecified ligament of other finger at metacarpophalangeal and interphalangeal joint, initial encounter: Secondary | ICD-10-CM | POA: Diagnosis not present

## 2022-08-04 DIAGNOSIS — M79645 Pain in left finger(s): Secondary | ICD-10-CM | POA: Diagnosis not present

## 2022-08-11 DIAGNOSIS — M79645 Pain in left finger(s): Secondary | ICD-10-CM | POA: Diagnosis not present

## 2022-08-11 DIAGNOSIS — M25632 Stiffness of left wrist, not elsewhere classified: Secondary | ICD-10-CM | POA: Diagnosis not present

## 2022-08-11 DIAGNOSIS — S52502D Unspecified fracture of the lower end of left radius, subsequent encounter for closed fracture with routine healing: Secondary | ICD-10-CM | POA: Diagnosis not present

## 2022-08-11 DIAGNOSIS — G90512 Complex regional pain syndrome I of left upper limb: Secondary | ICD-10-CM | POA: Diagnosis not present

## 2022-08-11 DIAGNOSIS — M79602 Pain in left arm: Secondary | ICD-10-CM | POA: Diagnosis not present

## 2022-08-11 DIAGNOSIS — M25642 Stiffness of left hand, not elsewhere classified: Secondary | ICD-10-CM | POA: Diagnosis not present

## 2022-08-11 DIAGNOSIS — S63408A Traumatic rupture of unspecified ligament of other finger at metacarpophalangeal and interphalangeal joint, initial encounter: Secondary | ICD-10-CM | POA: Diagnosis not present

## 2022-08-11 DIAGNOSIS — M65331 Trigger finger, right middle finger: Secondary | ICD-10-CM | POA: Diagnosis not present

## 2022-08-19 IMAGING — DX DG WRIST 2V*L*
2 series · 2 of 2 positions shown · non-contrast
Comparison: [DATE] [DATE], [DATE] [DATE] p.m.

CLINICAL DATA: Post reduction images

EXAM:
LEFT WRIST - 2 VIEW

[wrist ap]
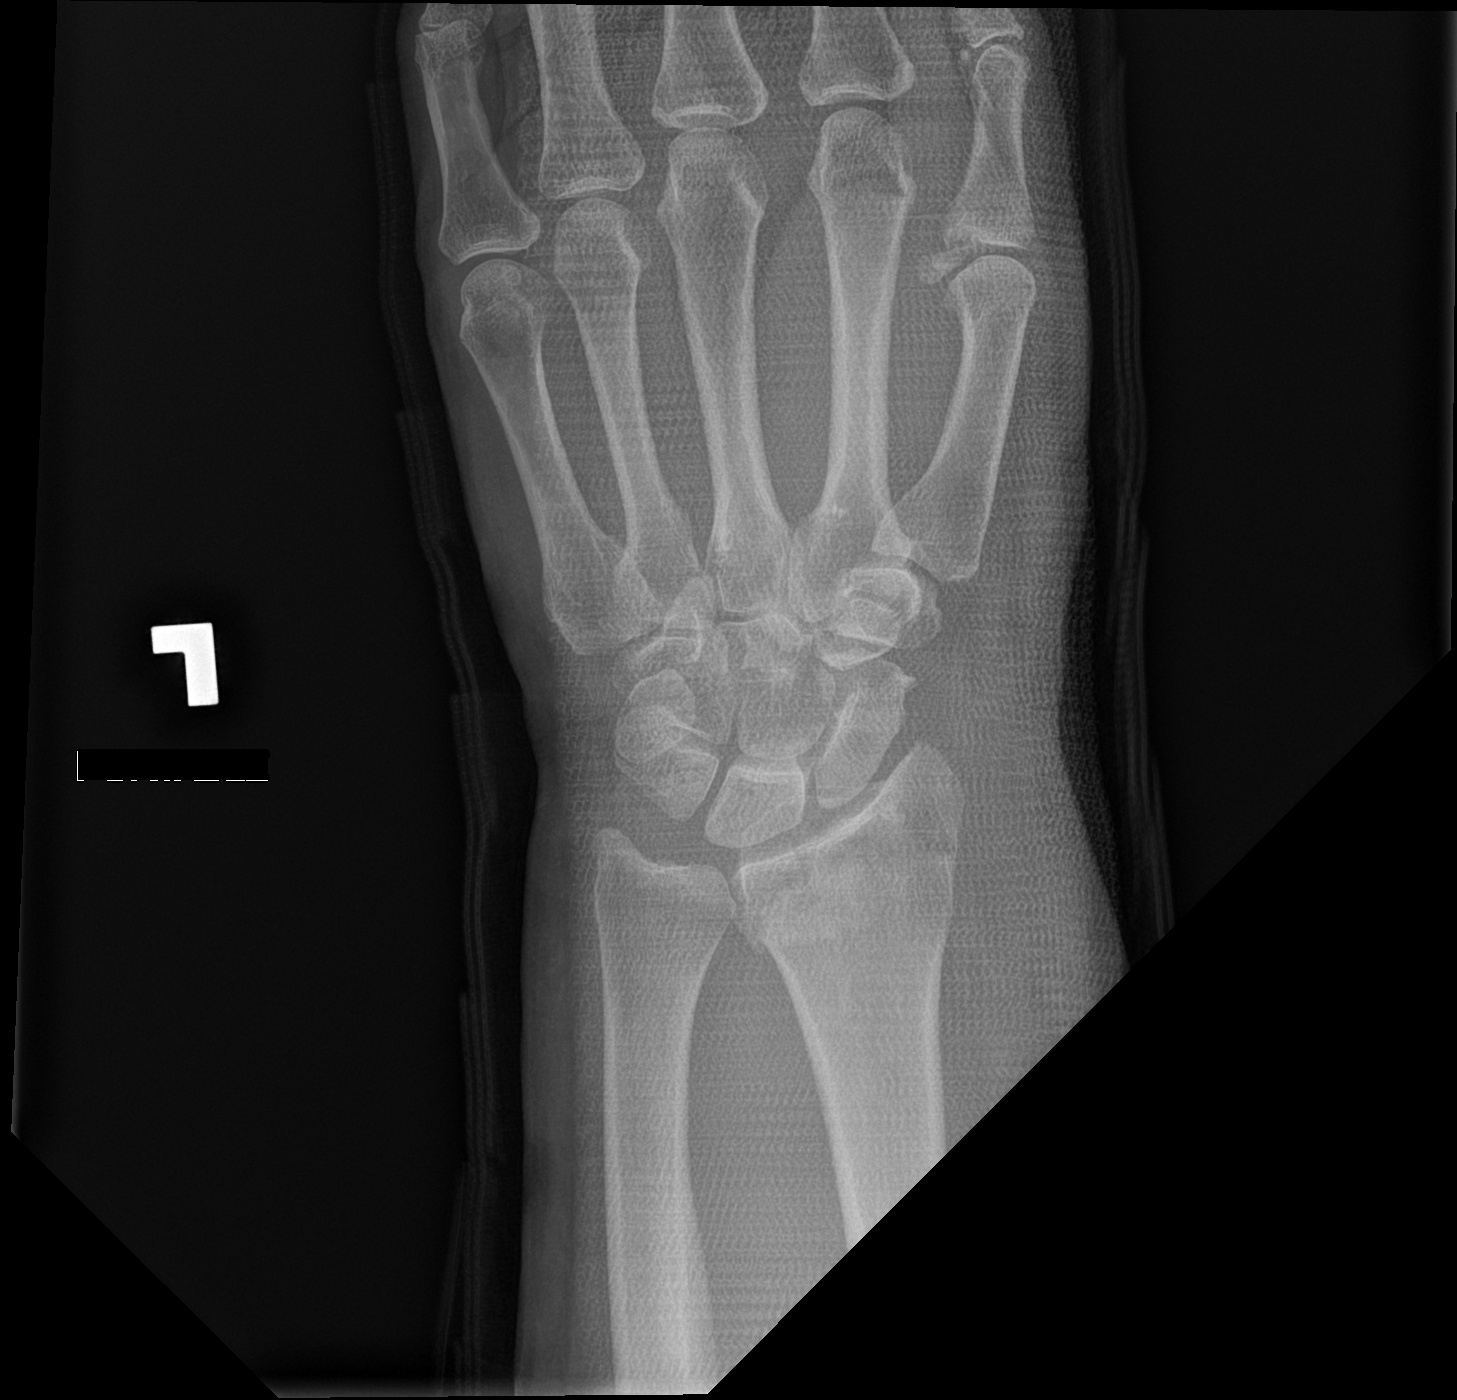

[wrist lat]
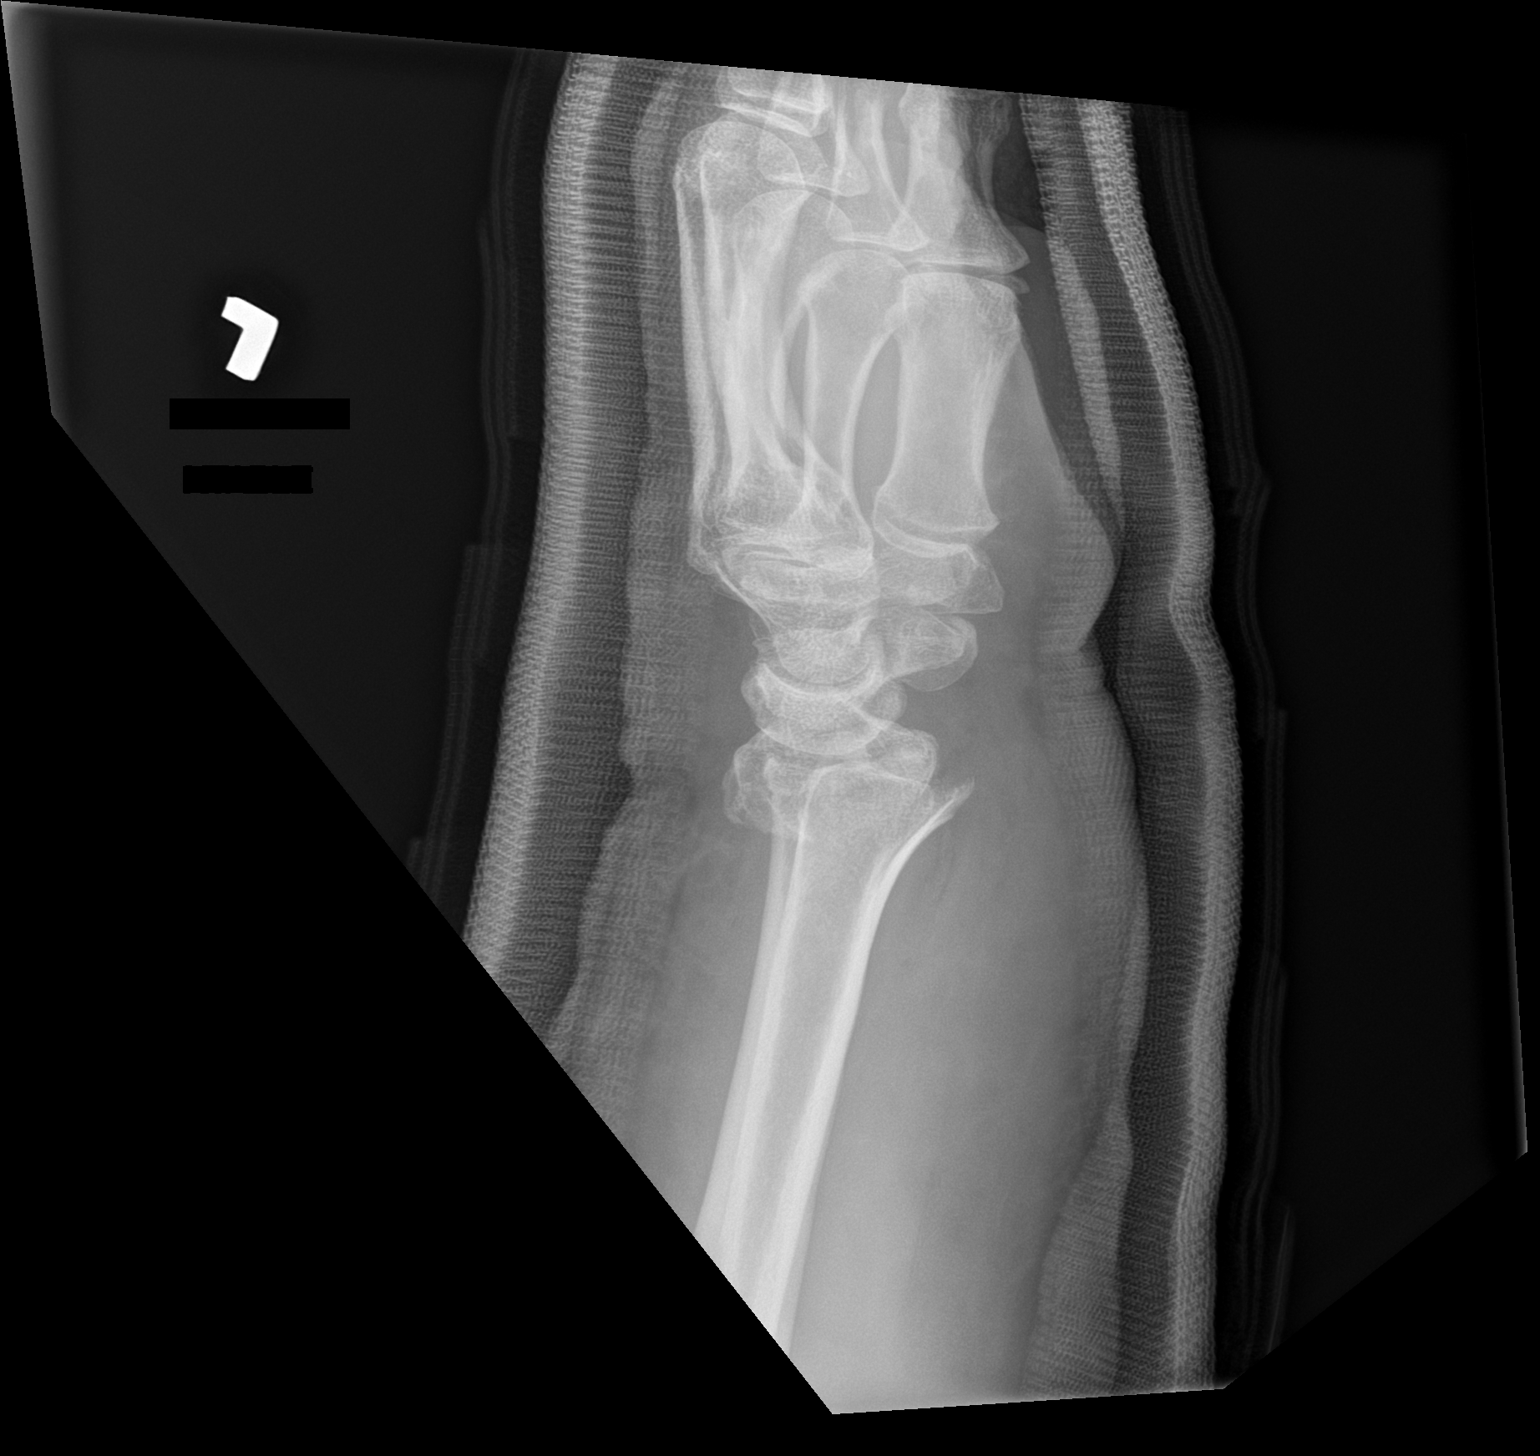

[2 of 2 positions shown; findings below may reference images not displayed]

FINDINGS: There is comminuted intra-articular impacted fracture of the distal
radius. There is slight interval improvement in the dorsal
angulation of the distal radius. Overlying splint material obscures
fine bony detail. There is diffuse soft tissue swelling seen around
the wrist.
IMPRESSION: Comminuted impacted intra-articular distal radius fracture with
slight interval improvement in the dorsal angulation.

## 2022-08-20 ENCOUNTER — Other Ambulatory Visit: Payer: Self-pay | Admitting: Nurse Practitioner

## 2022-08-20 DIAGNOSIS — K219 Gastro-esophageal reflux disease without esophagitis: Secondary | ICD-10-CM

## 2022-08-21 NOTE — Telephone Encounter (Signed)
Je NTBS 30 days given 07/28/22

## 2022-08-22 NOTE — Telephone Encounter (Signed)
Apt scheduled 09/01/2022

## 2022-08-25 DIAGNOSIS — G90512 Complex regional pain syndrome I of left upper limb: Secondary | ICD-10-CM | POA: Diagnosis not present

## 2022-08-25 DIAGNOSIS — M25642 Stiffness of left hand, not elsewhere classified: Secondary | ICD-10-CM | POA: Diagnosis not present

## 2022-08-25 DIAGNOSIS — M79602 Pain in left arm: Secondary | ICD-10-CM | POA: Diagnosis not present

## 2022-08-25 DIAGNOSIS — S63408A Traumatic rupture of unspecified ligament of other finger at metacarpophalangeal and interphalangeal joint, initial encounter: Secondary | ICD-10-CM | POA: Diagnosis not present

## 2022-08-25 DIAGNOSIS — M25632 Stiffness of left wrist, not elsewhere classified: Secondary | ICD-10-CM | POA: Diagnosis not present

## 2022-08-25 DIAGNOSIS — S52502D Unspecified fracture of the lower end of left radius, subsequent encounter for closed fracture with routine healing: Secondary | ICD-10-CM | POA: Diagnosis not present

## 2022-08-25 DIAGNOSIS — M79645 Pain in left finger(s): Secondary | ICD-10-CM | POA: Diagnosis not present

## 2022-08-25 DIAGNOSIS — M65331 Trigger finger, right middle finger: Secondary | ICD-10-CM | POA: Diagnosis not present

## 2022-09-01 ENCOUNTER — Ambulatory Visit: Payer: Medicare HMO | Admitting: Nurse Practitioner

## 2022-09-01 DIAGNOSIS — G90512 Complex regional pain syndrome I of left upper limb: Secondary | ICD-10-CM | POA: Diagnosis not present

## 2022-09-01 DIAGNOSIS — S52502D Unspecified fracture of the lower end of left radius, subsequent encounter for closed fracture with routine healing: Secondary | ICD-10-CM | POA: Diagnosis not present

## 2022-09-01 DIAGNOSIS — M79602 Pain in left arm: Secondary | ICD-10-CM | POA: Diagnosis not present

## 2022-09-01 DIAGNOSIS — M25632 Stiffness of left wrist, not elsewhere classified: Secondary | ICD-10-CM | POA: Diagnosis not present

## 2022-09-01 DIAGNOSIS — M65331 Trigger finger, right middle finger: Secondary | ICD-10-CM | POA: Diagnosis not present

## 2022-09-01 DIAGNOSIS — M25642 Stiffness of left hand, not elsewhere classified: Secondary | ICD-10-CM | POA: Diagnosis not present

## 2022-09-01 DIAGNOSIS — S63408A Traumatic rupture of unspecified ligament of other finger at metacarpophalangeal and interphalangeal joint, initial encounter: Secondary | ICD-10-CM | POA: Diagnosis not present

## 2022-09-01 DIAGNOSIS — M79645 Pain in left finger(s): Secondary | ICD-10-CM | POA: Diagnosis not present

## 2022-09-05 ENCOUNTER — Ambulatory Visit (INDEPENDENT_AMBULATORY_CARE_PROVIDER_SITE_OTHER): Payer: Medicare HMO | Admitting: Nurse Practitioner

## 2022-09-05 ENCOUNTER — Encounter: Payer: Self-pay | Admitting: Nurse Practitioner

## 2022-09-05 VITALS — BP 103/69 | HR 88 | Temp 98.4°F | Ht 62.0 in | Wt 185.0 lb

## 2022-09-05 DIAGNOSIS — I10 Essential (primary) hypertension: Secondary | ICD-10-CM | POA: Diagnosis not present

## 2022-09-05 DIAGNOSIS — R300 Dysuria: Secondary | ICD-10-CM | POA: Diagnosis not present

## 2022-09-05 DIAGNOSIS — K219 Gastro-esophageal reflux disease without esophagitis: Secondary | ICD-10-CM | POA: Diagnosis not present

## 2022-09-05 LAB — URINALYSIS
Bilirubin, UA: NEGATIVE
Glucose, UA: NEGATIVE
Leukocytes,UA: NEGATIVE
Nitrite, UA: NEGATIVE
Protein,UA: NEGATIVE
RBC, UA: NEGATIVE
Specific Gravity, UA: 1.02 (ref 1.005–1.030)
Urobilinogen, Ur: 0.2 mg/dL (ref 0.2–1.0)
pH, UA: 5.5 (ref 5.0–7.5)

## 2022-09-05 MED ORDER — CETIRIZINE HCL 10 MG PO TABS: 10.0000 mg | ORAL_TABLET | Freq: Every day | ORAL | 3 refills | Status: AC

## 2022-09-05 MED ORDER — OMEPRAZOLE 40 MG PO CPDR: DELAYED_RELEASE_CAPSULE | ORAL | 0 refills | Status: DC

## 2022-09-05 NOTE — Progress Notes (Signed)
Established Patient Office Visit  Subjective   Patient ID: Judy Evans, female    DOB: 03/26/1971  Age: 51 y.o. MRN: [id number]  Chief Complaint  Patient presents with   Medication Refill   Medical Management of Chronic Issues    6 month   Dysuria    Pt states this has been going on for about a week now , having low back pain , stomach pain , no blood , no smell , urge to go but can't sometimes     Dysuria  This is a new problem. The current episode started yesterday. The problem occurs intermittently. The problem has been unchanged. The quality of the pain is described as burning. The pain is at a severity of 1/10. The pain is mild. There has been no fever. Pertinent negatives include no chills, discharge, flank pain, nausea, urgency or vomiting. She has tried nothing for the symptoms. There is no history of kidney stones or recurrent UTIs.  Gastroesophageal Reflux She reports no abdominal pain, no belching, no chest pain, no choking, no coughing, no early satiety or no nausea. This is a chronic problem. The current episode started more than 1 year ago. The problem has been gradually improving. Nothing aggravates the symptoms. Risk factors include smoking/tobacco exposure. She has tried a PPI for the symptoms. The treatment provided significant relief.  Hypertension This is a chronic problem. The current episode started more than 1 year ago. The problem has been gradually worsening since onset. The problem is controlled. Associated symptoms include anxiety. Pertinent negatives include no chest pain. There are no associated agents to hypertension. Risk factors for coronary artery disease include smoking/tobacco exposure. The current treatment provides mild improvement. There are no compliance problems.     Patient Active Problem List   Diagnosis Date Noted   Precordial chest pain 01/11/2022   Complex regional pain syndrome type 1 of left upper extremity 03/04/2021   Subacromial  bursitis of right shoulder joint 11/24/2020   Trochanteric bursitis of right hip 11/24/2020   Pain of left lower leg 11/15/2020   Bilateral low back pain with right-sided sciatica 11/15/2020   Thrush 11/15/2020   Insomnia 11/19/2018   Other headache syndrome 07/18/2018   Occipital neuralgia 07/18/2018   Family history of cerebral aneurysm 07/18/2018   Numbness 04/23/2018   Cervical radiculopathy 04/23/2018   Encounter for long-term use of opiate analgesic 04/22/2018   Lumbar degenerative disc disease 04/22/2018   Spondylosis of lumbar spine 04/22/2018   Fibromyalgia affecting multiple sites 04/22/2018   Constipation due to pain medication 04/30/2017   Depression 03/22/2017   Trochanteric bursitis of left hip 03/13/2017   Neck pain 11/13/2016   Left shoulder pain 11/13/2016   Chronic bilateral low back pain 11/13/2016   History of colonic polyps 06/20/2016   IBS (irritable bowel syndrome) 06/20/2016   Epigastric pain 06/20/2016   Rhinitis, chronic 04/13/2016   Otalgia of both ears 04/13/2016   Arthralgia of right temporomandibular joint 04/13/2016   BMI 40.0-44.9, adult ([place]) 11/22/2015   Bilateral carpal tunnel syndrome 11/22/2015   Tobacco abuse 11/22/2015   Allergic rhinitis 11/01/2015   Gastroesophageal reflux disease 11/01/2015   LLQ pain 06/18/2015   Bloating 06/18/2015   Pain with urination 02/19/2015   Moody 01/22/2015   Pelvic pain in female 01/13/2015   Dysuria 01/13/2015   Hot flashes due to surgical menopause 01/13/2015   Pre-diabetes 01/13/2015   Hypertension 01/13/2015   Chronic migraine w/o aura, not intractable, w/o stat migr 01/13/2015  Psoriasis 01/13/2015   Arthritis 01/13/2015   Fibromyalgia 01/13/2015   COPD (chronic obstructive pulmonary disease) ([place]) 01/13/2015   Hyperlipidemia 01/13/2015   Abnormal nuclear stress test 01/07/2014   Abdominal pain 04/16/2013   Pain in joint involving multiple sites 02/20/2013   Head trauma 02/20/2013   Past  Medical History:  Diagnosis Date   Anxiety    Aphasia    Arthritis    COPD (chronic obstructive pulmonary disease) ([facility])    CRPS (complex regional pain syndrome type I)    DDD (degenerative disc disease), cervical    Degenerative disc disease at L5-S1 level    Fibromyalgia    GERD (gastroesophageal reflux disease)    IBS (irritable bowel syndrome)    Migraines    Moody 01/22/2015   Psoriasis    Scoliosis    Spondylolysis    Past Surgical History:  Procedure Laterality Date   ABDOMINAL HYSTERECTOMY     BIOPSY  09/14/2016   Procedure: BIOPSY;  Surgeon: Daneil Dolin, MD;  Location: [facility];  Service: Endoscopy;;  gastric    CARDIAC CATHETERIZATION  2015   patient reported it to be normal. "I was dying in pain during procedure".    CARPAL TUNNEL RELEASE Right    CESAREAN SECTION     CHOLECYSTECTOMY     COLONOSCOPY  06/2014   Altamese Dilling DeMason: normal   DILATION AND CURETTAGE OF UTERUS     ESOPHAGOGASTRODUODENOSCOPY  07/2014   Burke Keels: Normal   ESOPHAGOGASTRODUODENOSCOPY (EGD) WITH PROPOFOL N/A 09/14/2016   Dr. Gala Romney: Normal esophagus status post empiric dilation, erosive gastropathy with biopsies showing chronic inactive gastritis, no H pylori.   EXTERNAL FIXATION OF FINGER Left 12/01/2021   Procedure: LEFT RING DIGIT WIDGET PLACEMENT;  Surgeon: Leanora Cover, MD;  Location: [facility];  Service: Orthopedics;  Laterality: Left;   FOOT SURGERY Bilateral    removal of heel spurs   MALONEY DILATION  09/14/2016   Procedure: MALONEY DILATION;  Surgeon: Daneil Dolin, MD;  Location: [facility];  Service: Endoscopy;;   rotator cuff surgery Right    TONSILLECTOMY     Social History   Tobacco Use   Smoking status: Every Day    Packs/day: 1.50    Years: 30.00    Total pack years: 45.00    Types: Cigarettes   Smokeless tobacco: Never  Vaping Use   Vaping Use: Former  Substance Use Topics   Alcohol use: No    Comment: rarely   Drug use: Yes     Types: Marijuana   Social History   Socioeconomic History   Marital status: Legally Separated    Spouse name: Not on file   Number of children: 2   Years of education: GED   Highest education level: GED or equivalent  Occupational History   Occupation: Disabled  Tobacco Use   Smoking status: Every Day    Packs/day: 1.50    Years: 30.00    Total pack years: 45.00    Types: Cigarettes   Smokeless tobacco: Never  Vaping Use   Vaping Use: Former  Substance and Sexual Activity   Alcohol use: No    Comment: rarely   Drug use: Yes    Types: Marijuana   Sexual activity: Not Currently    Birth control/protection: Surgical    Comment: hyst  Other Topics Concern   Not on file  Social History Narrative   Caffeine use: Drinks coffee (2 cups per day), tea/soda- 2-3 cups  per day   Right handed   Lives alone.   Lost oldest sister 07/2021 due to cardiac issues. Youngest sister died 09-24-21 due to unknown causes.    Social Determinants of Health   Financial Resource Strain: Low Risk  (01/04/2022)   Overall Financial Resource Strain (CARDIA)    Difficulty of Paying Living Expenses: Not hard at all  Food Insecurity: No Food Insecurity (01/04/2022)   Hunger Vital Sign    Worried About Running Out of Food in the Last Year: Never true    Ran Out of Food in the Last Year: Never true  Transportation Needs: No Transportation Needs (01/04/2022)   PRAPARE - Hydrologist (Medical): No    Lack of Transportation (Non-Medical): No  Physical Activity: Insufficiently Active (01/04/2022)   Exercise Vital Sign    Days of Exercise per Week: 3 days    Minutes of Exercise per Session: 30 min  Stress: Stress Concern Present (01/04/2022)   [place]    Feeling of Stress : Very much  Social Connections: Moderately Isolated (01/04/2022)   Social Connection and Isolation Panel [NHANES]    Frequency of  Communication with Friends and Family: More than three times a week    Frequency of Social Gatherings with Friends and Family: More than three times a week    Attends Religious Services: Never    Marine scientist or Organizations: No    Attends Music therapist: 1 to 4 times per year    Marital Status: Separated  Intimate Partner Violence: Not At Risk (01/04/2022)   Humiliation, Afraid, Rape, and Kick questionnaire    Fear of Current or Ex-Partner: No    Emotionally Abused: No    Physically Abused: No    Sexually Abused: No   Family Status  Relation Name Status   Mother  Alive   Father  Alive   Sister  Deceased   Sister  Deceased   Daughter  Alive   MGM  Alive   MGF  Deceased   [place]  Deceased   PGF  Deceased   Brother  Deceased   Son  Alive   Family History  Problem Relation Age of Onset   COPD Mother    Diabetes Mother    Osteoarthritis Mother    Atrial fibrillation Mother    Healthy Father    Atrial fibrillation Father    Lupus Sister    CAD Sister 54       CABG   Diabetes Sister    Factor V Leiden deficiency Sister    Arthritis Sister    Psoriasis Sister    Asthma Daughter    Cancer Maternal Grandmother 43       colon   Arthritis Maternal Grandmother    Diabetes Maternal Grandmother    Dementia Maternal Grandmother    Other Maternal Grandfather        brain tumor   Breast cancer Paternal Grandmother    Cancer Paternal Grandmother        breast   Cancer Paternal Grandfather        lung   Other Paternal Grandfather        brain aneurysm   Early death Brother        pneumonia   Asthma Son    Allergies  Allergen Reactions   Rosuvastatin Calcium Swelling    Ends up in the hospital   Statins Swelling  Ends up in the hospital   Wellbutrin [Bupropion]     suicidal   Hctz [Hydrochlorothiazide] Nausea And Vomiting and Rash   Hydrocodone-Acetaminophen Rash   Oxycodone Rash   Penicillins Nausea And Vomiting and Rash    Has patient had  a PCN reaction causing immediate rash, facial/tongue/throat swelling, SOB or lightheadedness with hypotension: Yes Has patient had a PCN reaction causing severe rash involving mucus membranes or skin necrosis: No Has patient had a PCN reaction that required hospitalization No Has patient had a PCN reaction occurring within the last 10 years: No If all of the above answers are "NO", then may proceed with Cephalosporin use.       Review of Systems  Constitutional: Negative.  Negative for chills.  HENT: Negative.    Respiratory:  Negative for cough and choking.   Cardiovascular: Negative.  Negative for chest pain.  Gastrointestinal:  Negative for abdominal pain, nausea and vomiting.  Genitourinary:  Positive for dysuria. Negative for flank pain and urgency.  Skin: Negative.  Negative for itching and rash.  All other systems reviewed and are negative.     Objective:     BP 103/69   Pulse 88   Temp 98.4 F (36.9 C)   Ht _0  (1.575 m)   Wt 185 lb (83.9 kg)   SpO2 99%   BMI 33.84 kg/m  BP Readings from Last 3 Encounters:  09/05/22 103/69  06/05/22 127/75  02/07/22 136/87   Wt Readings from Last 3 Encounters:  09/05/22 185 lb (83.9 kg)  06/05/22 195 lb (88.5 kg)  02/07/22 201 lb (91.2 kg)      Physical Exam Vitals and nursing note reviewed.  Constitutional:      Appearance: Normal appearance.  HENT:     Head: Normocephalic.     Right Ear: External ear normal.     Left Ear: External ear normal.     Nose: Nose normal.     Mouth/Throat:     Mouth: Mucous membranes are moist.     Pharynx: Oropharynx is clear.  Eyes:     Conjunctiva/sclera: Conjunctivae normal.  Cardiovascular:     Rate and Rhythm: Normal rate and regular rhythm.  Pulmonary:     Effort: Pulmonary effort is normal.     Breath sounds: Normal breath sounds.  Abdominal:     General: Bowel sounds are normal.  Musculoskeletal:        General: Normal range of motion.  Skin:    General: Skin is warm.      Findings: No erythema or rash.  Neurological:     General: No focal deficit present.     Mental Status: She is alert and oriented to person, place, and time.  Psychiatric:        Behavior: Behavior normal.      Results for orders placed or performed in visit on 09/05/22  Urinalysis  Result Value Ref Range   Specific Gravity, UA 1.020 1.005 - 1.030   pH, UA 5.5 5.0 - 7.5   Color, UA Yellow Yellow   Appearance Ur Clear Clear   Leukocytes,UA Negative Negative   Protein,UA Negative Negative/Trace   Glucose, UA Negative Negative   Ketones, UA Trace (A) Negative   RBC, UA Negative Negative   Bilirubin, UA Negative Negative   Urobilinogen, Ur 0.2 0.2 - 1.0 mg/dL   Nitrite, UA Negative Negative    Last CBC Lab Results  Component Value Date   WBC 5.9 01/12/2022   HGB  16.4 (H) 01/12/2022   HCT 48.4 (H) 01/12/2022   MCV 93 01/12/2022   MCH 31.5 01/12/2022   RDW 12.4 01/12/2022   PLT 219 00/51/1021   Last metabolic panel Lab Results  Component Value Date   GLUCOSE 89 01/12/2022   NA 139 01/12/2022   K 4.5 01/12/2022   CL 104 01/12/2022   CO2 22 01/12/2022   BUN 15 01/12/2022   CREATININE 0.76 01/12/2022   EGFR 96 01/12/2022   CALCIUM 9.6 01/12/2022   PROT 6.9 11/24/2020   ALBUMIN 4.0 11/24/2020   LABGLOB 2.3 03/22/2018   AGRATIO 2.0 03/22/2018   BILITOT 0.6 11/24/2020   ALKPHOS 70 11/24/2020   AST 27 11/24/2020   ALT 18 11/24/2020   ANIONGAP 9 11/24/2020   Last lipids Lab Results  Component Value Date   CHOL 231 (H) 01/12/2022   HDL 65 01/12/2022   LDLCALC 148 (H) 01/12/2022   TRIG 100 01/12/2022   CHOLHDL 3.6 01/12/2022   Last hemoglobin A1c Lab Results  Component Value Date   HGBA1C 5.5 03/22/2018   Last thyroid functions Lab Results  Component Value Date   TSH 1.230 03/22/2018   Last vitamin D Lab Results  Component Value Date   VD25OH 23.4 (L) 01/22/2017   Last vitamin B12 and Folate Lab Results  Component Value Date   VITAMINB12 283  09/15/2016   FOLATE 2.0 (L) 09/15/2016      The 10-year ASCVD risk score (Arnett DK, et al., 2019) is: 4.4%    Assessment & Plan:   Problem List Items Addressed This Visit       Cardiovascular and Mediastinum   Hypertension - Primary    Symptoms well controlled. Follow up in 6 months      Relevant Orders   CMP14+EGFR   Lipid panel   TSH     Digestive   Gastroesophageal reflux disease    Symptoms well controlled on Protonix 20 mg tablet by mouth, follow up in 6 months RX refill sent to pharmacy.      Relevant Medications   omeprazole (PRILOSEC) 40 MG capsule     Other   Dysuria    Patient presents with urinary urgency UTI  vs  interstitial cystitis -urinalysis completed results pending -pyridium for pain Precaution and education provided -All questions answered -follow up with unresolved symptoms       Relevant Orders   Urine Culture   Urinalysis (Completed)    Return in about 6 months (around 03/07/2023) for chronic disease management.    Ivy Lynn, NP

## 2022-09-05 NOTE — Assessment & Plan Note (Signed)
Symptoms well controlled. Follow up in 6 months

## 2022-09-05 NOTE — Patient Instructions (Signed)

## 2022-09-05 NOTE — Assessment & Plan Note (Addendum)
Patient presents with urinary urgency UTI  vs  interstitial cystitis -urinalysis completed results pending -pyridium for pain Precaution and education provided -All questions answered -follow up with unresolved symptoms

## 2022-09-05 NOTE — Assessment & Plan Note (Signed)
Symptoms well controlled on Protonix 20 mg tablet by mouth, follow up in 6 months RX refill sent to pharmacy.

## 2022-09-06 ENCOUNTER — Other Ambulatory Visit: Payer: Self-pay | Admitting: Nurse Practitioner

## 2022-09-06 ENCOUNTER — Telehealth: Payer: Self-pay | Admitting: Nurse Practitioner

## 2022-09-06 DIAGNOSIS — E781 Pure hyperglyceridemia: Secondary | ICD-10-CM

## 2022-09-06 LAB — CMP14+EGFR
ALT: 10 IU/L (ref 0–32)
AST: 16 IU/L (ref 0–40)
Albumin/Globulin Ratio: 2.3 — ABNORMAL HIGH (ref 1.2–2.2)
Albumin: 4.6 g/dL (ref 3.9–4.9)
Alkaline Phosphatase: 103 IU/L (ref 44–121)
BUN/Creatinine Ratio: 13 (ref 9–23)
BUN: 9 mg/dL (ref 6–24)
Bilirubin Total: 0.4 mg/dL (ref 0.0–1.2)
CO2: 22 mmol/L (ref 20–29)
Calcium: 9.5 mg/dL (ref 8.7–10.2)
Chloride: 101 mmol/L (ref 96–106)
Creatinine, Ser: 0.7 mg/dL (ref 0.57–1.00)
Globulin, Total: 2 g/dL (ref 1.5–4.5)
Glucose: 86 mg/dL (ref 70–99)
Potassium: 4.2 mmol/L (ref 3.5–5.2)
Sodium: 141 mmol/L (ref 134–144)
Total Protein: 6.6 g/dL (ref 6.0–8.5)
eGFR: 105 mL/min/{1.73_m2} (ref >59)

## 2022-09-06 LAB — LIPID PANEL
Chol/HDL Ratio: 3.9 ratio (ref 0.0–4.4)
Cholesterol, Total: 213 mg/dL — ABNORMAL HIGH (ref 100–199)
HDL: 54 mg/dL (ref >39)
LDL Chol Calc (NIH): 132 mg/dL — ABNORMAL HIGH (ref 0–99)
Triglycerides: 150 mg/dL — ABNORMAL HIGH (ref 0–149)
VLDL Cholesterol Cal: 27 mg/dL (ref 5–40)

## 2022-09-06 LAB — TSH: TSH: 0.921 u[IU]/mL (ref 0.450–4.500)

## 2022-09-06 MED ORDER — OMEGA-3 FATTY ACIDS 1000 MG PO CAPS: 2.0000 g | ORAL_CAPSULE | Freq: Every day | ORAL | 2 refills | Status: DC

## 2022-09-06 NOTE — Telephone Encounter (Signed)
Spoke to patient she is eager to get started taking some medication for what she believes is a UTI. I informed that we were waiting for Urine Culture. She said that  her urinalysis showed ketones and wants to know if something can be prescribed now.  She also mentioned that her cholesterol panel had high values and thinks that she should be put on a medication to help bring those numbers down.

## 2022-09-06 NOTE — Telephone Encounter (Signed)
Life style changes and omega 3 fish oil will help reduce her triglycerides. I sent that to pharmacy, I usually start patient on statin when cholesterol is above 250. Follow up in 3 months to repeat lipid panel

## 2022-09-07 LAB — URINE CULTURE

## 2022-09-07 NOTE — Telephone Encounter (Signed)
Attempted to call pt left vm for cb

## 2022-09-08 DIAGNOSIS — S52502D Unspecified fracture of the lower end of left radius, subsequent encounter for closed fracture with routine healing: Secondary | ICD-10-CM | POA: Diagnosis not present

## 2022-09-08 DIAGNOSIS — M79602 Pain in left arm: Secondary | ICD-10-CM | POA: Diagnosis not present

## 2022-09-08 DIAGNOSIS — J449 Chronic obstructive pulmonary disease, unspecified: Secondary | ICD-10-CM | POA: Diagnosis not present

## 2022-09-08 DIAGNOSIS — R69 Illness, unspecified: Secondary | ICD-10-CM | POA: Diagnosis not present

## 2022-09-08 DIAGNOSIS — G90512 Complex regional pain syndrome I of left upper limb: Secondary | ICD-10-CM | POA: Diagnosis not present

## 2022-09-08 DIAGNOSIS — M65331 Trigger finger, right middle finger: Secondary | ICD-10-CM | POA: Diagnosis not present

## 2022-09-08 DIAGNOSIS — S63408A Traumatic rupture of unspecified ligament of other finger at metacarpophalangeal and interphalangeal joint, initial encounter: Secondary | ICD-10-CM | POA: Diagnosis not present

## 2022-09-08 DIAGNOSIS — R1110 Vomiting, unspecified: Secondary | ICD-10-CM | POA: Diagnosis not present

## 2022-09-08 DIAGNOSIS — M797 Fibromyalgia: Secondary | ICD-10-CM | POA: Diagnosis not present

## 2022-09-08 DIAGNOSIS — L409 Psoriasis, unspecified: Secondary | ICD-10-CM | POA: Diagnosis not present

## 2022-09-08 DIAGNOSIS — M79645 Pain in left finger(s): Secondary | ICD-10-CM | POA: Diagnosis not present

## 2022-09-08 DIAGNOSIS — R519 Headache, unspecified: Secondary | ICD-10-CM | POA: Diagnosis not present

## 2022-09-08 DIAGNOSIS — Z20822 Contact with and (suspected) exposure to covid-19: Secondary | ICD-10-CM | POA: Diagnosis not present

## 2022-09-08 DIAGNOSIS — M25642 Stiffness of left hand, not elsewhere classified: Secondary | ICD-10-CM | POA: Diagnosis not present

## 2022-09-08 DIAGNOSIS — M25632 Stiffness of left wrist, not elsewhere classified: Secondary | ICD-10-CM | POA: Diagnosis not present

## 2022-09-11 DIAGNOSIS — M25642 Stiffness of left hand, not elsewhere classified: Secondary | ICD-10-CM | POA: Diagnosis not present

## 2022-09-11 DIAGNOSIS — M65331 Trigger finger, right middle finger: Secondary | ICD-10-CM | POA: Diagnosis not present

## 2022-09-11 DIAGNOSIS — M24542 Contracture, left hand: Secondary | ICD-10-CM | POA: Diagnosis not present

## 2022-09-11 DIAGNOSIS — S63408A Traumatic rupture of unspecified ligament of other finger at metacarpophalangeal and interphalangeal joint, initial encounter: Secondary | ICD-10-CM | POA: Diagnosis not present

## 2022-09-11 DIAGNOSIS — M79602 Pain in left arm: Secondary | ICD-10-CM | POA: Diagnosis not present

## 2022-09-11 DIAGNOSIS — S52502D Unspecified fracture of the lower end of left radius, subsequent encounter for closed fracture with routine healing: Secondary | ICD-10-CM | POA: Diagnosis not present

## 2022-09-11 DIAGNOSIS — M25632 Stiffness of left wrist, not elsewhere classified: Secondary | ICD-10-CM | POA: Diagnosis not present

## 2022-09-11 DIAGNOSIS — M79645 Pain in left finger(s): Secondary | ICD-10-CM | POA: Diagnosis not present

## 2022-09-11 DIAGNOSIS — G90512 Complex regional pain syndrome I of left upper limb: Secondary | ICD-10-CM | POA: Diagnosis not present

## 2022-09-11 NOTE — Telephone Encounter (Signed)
Lmtcb.

## 2022-09-11 NOTE — Telephone Encounter (Signed)
Patient calling back. Please call back.

## 2022-09-15 MED ORDER — OMEGA-3 FATTY ACIDS 1000 MG PO CAPS: 2.0000 g | ORAL_CAPSULE | Freq: Every day | ORAL | 2 refills | Status: AC

## 2022-09-15 NOTE — Telephone Encounter (Signed)
I spoke to pt and advised her urine culture was negative for infection and reviewed her labs and advised fish oil and zyrtec was sent to the pharmacy for her at her visit.

## 2022-09-15 NOTE — Addendum Note (Signed)
Addended by: Milas Hock on: 09/15/2022 11:37 AM   Modules accepted: Orders

## 2022-09-15 NOTE — Telephone Encounter (Signed)
Patient calling back to discuss levels being high when she had lab work completed.   She is also still having pressure in vaginal area. Was seen on 12/12, where she discussed having a UTI. Would like to talk to nurse about what she can do to treat. Please call back

## 2022-09-20 ENCOUNTER — Telehealth: Payer: Self-pay | Admitting: Nurse Practitioner

## 2022-09-26 DIAGNOSIS — D239 Other benign neoplasm of skin, unspecified: Secondary | ICD-10-CM | POA: Diagnosis not present

## 2022-09-26 DIAGNOSIS — Z79899 Other long term (current) drug therapy: Secondary | ICD-10-CM | POA: Diagnosis not present

## 2022-09-26 DIAGNOSIS — L409 Psoriasis, unspecified: Secondary | ICD-10-CM | POA: Diagnosis not present

## 2022-09-28 ENCOUNTER — Other Ambulatory Visit: Payer: Self-pay | Admitting: Nurse Practitioner

## 2022-09-28 DIAGNOSIS — K219 Gastro-esophageal reflux disease without esophagitis: Secondary | ICD-10-CM

## 2022-10-05 ENCOUNTER — Other Ambulatory Visit: Payer: Self-pay

## 2022-10-05 ENCOUNTER — Telehealth: Payer: Self-pay

## 2022-10-05 ENCOUNTER — Telehealth: Payer: Self-pay | Admitting: Neurology

## 2022-10-05 MED ORDER — AMITRIPTYLINE HCL 25 MG PO TABS: ORAL_TABLET | ORAL | 1 refills | Status: DC

## 2022-10-05 NOTE — Telephone Encounter (Signed)
Called Pt and told her that I refilled her Amitriptyline Pt verbalized understanding and Thanked Me.

## 2022-10-05 NOTE — Telephone Encounter (Signed)
Pt called and request a refill on amitriptyline (ELAVIL) 25 MG tablet. Should be sent to [id number] Pt said she is completely out of medication.

## 2022-10-10 ENCOUNTER — Ambulatory Visit: Payer: Medicare HMO | Admitting: Podiatry

## 2022-10-10 ENCOUNTER — Encounter: Payer: Self-pay | Admitting: *Deleted

## 2022-10-13 ENCOUNTER — Encounter: Payer: Self-pay | Admitting: Family

## 2022-10-13 ENCOUNTER — Telehealth (INDEPENDENT_AMBULATORY_CARE_PROVIDER_SITE_OTHER): Payer: Medicare HMO | Admitting: Family

## 2022-10-13 DIAGNOSIS — J019 Acute sinusitis, unspecified: Secondary | ICD-10-CM

## 2022-10-13 DIAGNOSIS — F17210 Nicotine dependence, cigarettes, uncomplicated: Secondary | ICD-10-CM | POA: Diagnosis not present

## 2022-10-13 DIAGNOSIS — R69 Illness, unspecified: Secondary | ICD-10-CM | POA: Diagnosis not present

## 2022-10-13 MED ORDER — DOXYCYCLINE HYCLATE 100 MG PO TABS: 100.0000 mg | ORAL_TABLET | Freq: Two times a day (BID) | ORAL | 0 refills | Status: DC

## 2022-10-13 NOTE — Progress Notes (Signed)
Virtual Visit Consent   [name], you are scheduled for a virtual visit with a [name] provider today. Just as with appointments in the office, your consent must be obtained to participate. Your consent will be active for this visit and any virtual visit you may have with one of our providers in the next 365 days. If you have a MyChart account, a copy of this consent can be sent to you electronically.  As this is a virtual visit, video technology does not allow for your provider to perform a traditional examination. This may limit your provider's ability to fully assess your condition. If your provider identifies any concerns that need to be evaluated in person or the need to arrange testing (such as labs, EKG, etc.), we will make arrangements to do so. Although advances in technology are sophisticated, we cannot ensure that it will always work on either your end or our end. If the connection with a video visit is poor, the visit may have to be switched to a telephone visit. With either a video or telephone visit, we are not always able to ensure that we have a secure connection.  By engaging in this virtual visit, you consent to the provision of healthcare and authorize for your insurance to be billed (if applicable) for the services provided during this visit. Depending on your insurance coverage, you may receive a charge related to this service.  I need to obtain your verbal consent now. Are you willing to proceed with your visit today? [name] has provided verbal consent on 10/13/2022 for a virtual visit (video or telephone). Evelina Dun, FNP  Date: 10/13/2022 12:42 PM  Virtual Visit via Video Note   I, Evelina Dun, connected with  Judy Evans  ([id number], 1971/03/09) on 10/13/22 at  5:00 PM EST by a video-enabled telemedicine application and verified that I am speaking with the correct person using two identifiers.  Location: Patient: Virtual Visit Location  Patient: Other: car Provider: Virtual Visit Location Provider: Office/Clinic   I discussed the limitations of evaluation and management by telemedicine and the availability of in person appointments. The patient expressed understanding and agreed to proceed.    History of Present Illness: Judy Evans is a 52 y.o. who identifies as a female who was assigned female at birth, and is being seen today for sinus congestion for a month.  HPI: Sinusitis This is a new problem. The current episode started 1 to 4 weeks ago. The problem has been gradually worsening since onset. There has been no fever. Her pain is at a severity of 7/10. The pain is moderate. Associated symptoms include congestion, coughing, ear pain, headaches, sinus pressure, sneezing and a sore throat (improved). Pertinent negatives include no chills or shortness of breath. Past treatments include oral decongestants, acetaminophen and saline nose sprays.    Problems:  Patient Active Problem List   Diagnosis Date Noted   Precordial chest pain 01/11/2022   Complex regional pain syndrome type 1 of left upper extremity 03/04/2021   Subacromial bursitis of right shoulder joint 11/24/2020   Trochanteric bursitis of right hip 11/24/2020   Pain of left lower leg 11/15/2020   Bilateral low back pain with right-sided sciatica 11/15/2020   Thrush 11/15/2020   Insomnia 11/19/2018   Other headache syndrome 07/18/2018   Occipital neuralgia 07/18/2018   Family history of cerebral aneurysm 07/18/2018   Numbness 04/23/2018   Cervical radiculopathy 04/23/2018   Encounter for long-term use of opiate  analgesic 04/22/2018   Lumbar degenerative disc disease 04/22/2018   Spondylosis of lumbar spine 04/22/2018   Fibromyalgia affecting multiple sites 04/22/2018   Constipation due to pain medication 04/30/2017   Depression 03/22/2017   Trochanteric bursitis of left hip 03/13/2017   Neck pain 11/13/2016   Left shoulder pain 11/13/2016    Chronic bilateral low back pain 11/13/2016   History of colonic polyps 06/20/2016   IBS (irritable bowel syndrome) 06/20/2016   Epigastric pain 06/20/2016   Rhinitis, chronic 04/13/2016   Otalgia of both ears 04/13/2016   Arthralgia of right temporomandibular joint 04/13/2016   BMI 40.0-44.9, adult ([place]) 11/22/2015   Bilateral carpal tunnel syndrome 11/22/2015   Tobacco abuse 11/22/2015   Allergic rhinitis 11/01/2015   Gastroesophageal reflux disease 11/01/2015   LLQ pain 06/18/2015   Bloating 06/18/2015   Pain with urination 02/19/2015   Moody 01/22/2015   Pelvic pain in female 01/13/2015   Dysuria 01/13/2015   Hot flashes due to surgical menopause 01/13/2015   Pre-diabetes 01/13/2015   Hypertension 01/13/2015   Chronic migraine w/o aura, not intractable, w/o stat migr 01/13/2015   Psoriasis 01/13/2015   Arthritis 01/13/2015   Fibromyalgia 01/13/2015   COPD (chronic obstructive pulmonary disease) ([place]) 01/13/2015   Hyperlipidemia 01/13/2015   Abnormal nuclear stress test 01/07/2014   Abdominal pain 04/16/2013   Pain in joint involving multiple sites 02/20/2013   Head trauma 02/20/2013    Allergies:  Allergies  Allergen Reactions   Rosuvastatin Calcium Swelling    Ends up in the hospital   Statins Swelling    Ends up in the hospital   Wellbutrin [Bupropion]     suicidal   Hctz [Hydrochlorothiazide] Nausea And Vomiting and Rash   Hydrocodone-Acetaminophen Rash   Oxycodone Rash   Penicillins Nausea And Vomiting and Rash    Has patient had a PCN reaction causing immediate rash, facial/tongue/throat swelling, SOB or lightheadedness with hypotension: Yes Has patient had a PCN reaction causing severe rash involving mucus membranes or skin necrosis: No Has patient had a PCN reaction that required hospitalization No Has patient had a PCN reaction occurring within the last 10 years: No If all of the above answers are "NO", then may proceed with Cephalosporin use.     Medications:  Current Outpatient Medications:    doxycycline (VIBRA-TABS) 100 MG tablet, Take 1 tablet (100 mg total) by mouth 2 (two) times daily., Disp: 20 tablet, Rfl: 0   albuterol (VENTOLIN HFA) 108 (90 Base) MCG/ACT inhaler, USE 2 PUFFS BY MOUTH EVERY 4 HOURS AS NEEDED FOR WHEEZING OR SHORTNESS OF BREATH, Disp: 18 each, Rfl: 3   amitriptyline (ELAVIL) 25 MG tablet, Two po qHS, Disp: 60 tablet, Rfl: 1   celecoxib (CELEBREX) 200 MG capsule, Take by mouth., Disp: , Rfl:    cetirizine (ZYRTEC) 10 MG tablet, Take 1 tablet (10 mg total) by mouth daily., Disp: 90 tablet, Rfl: 3   COSENTYX SENSOREADY, 300 MG, 150 MG/ML SOAJ, , Disp: , Rfl:    fish oil-omega-3 fatty acids 1000 MG capsule, Take 2 capsules (2 g total) by mouth daily., Disp: 120 capsule, Rfl: 2   fluticasone (FLONASE) 50 MCG/ACT nasal spray, Administer 2 sprays in each nostril daily., Disp: , Rfl:    Fremanezumab-vfrm (AJOVY) 225 MG/1.5ML SOSY, INJECT 1 SYRINGE UNDER THE SKIN EVERY 28 DAYS., Disp: 4.5 mL, Rfl: 3   gabapentin (NEURONTIN) 800 MG tablet, Take 1 tablet (800 mg total) by mouth 4 (four) times daily. (Needs to be seen before next  refill), Disp: 120 tablet, Rfl: 3   ipratropium-albuterol (DUONEB) 0.5-2.5 (3) MG/3ML SOLN, INHALE 3 ML BY NEBULIZER EVERY 6 HOURS AS NEEDED, Disp: 180 mL, Rfl: 2   lidocaine (LIDODERM) 5 %, Place 1 patch onto the skin daily. Remove & Discard patch within 12 hours or as directed by MD, Disp: 30 patch, Rfl: 5   nitroGLYCERIN (NITROSTAT) 0.4 MG SL tablet, Place 1 tablet (0.4 mg total) under the tongue every 5 (five) minutes as needed for chest pain., Disp: 25 tablet, Rfl: 3   nystatin ointment (MYCOSTATIN), Apply 1 application topically 2 (two) times daily as needed., Disp: 30 g, Rfl: 0   omeprazole (PRILOSEC) 40 MG capsule, TAKE 1 CAPSULE BY MOUTH EVERY DAY, Disp: 90 capsule, Rfl: 1   triamcinolone cream (KENALOG) 0.1 %, , Disp: , Rfl:    triamcinolone ointment (KENALOG) 0.5 %, Apply topically 2  (two) times daily., Disp: , Rfl:   Observations/Objective: Patient is well-developed, well-nourished in no acute distress.  Resting comfortably   Head is normocephalic, atraumatic.  No labored breathing.  Speech is clear and coherent with logical content.  Patient is alert and oriented at baseline.  Nasal congestion   Assessment and Plan: 1. Acute sinusitis, recurrence not specified, unspecified location - doxycycline (VIBRA-TABS) 100 MG tablet; Take 1 tablet (100 mg total) by mouth 2 (two) times daily.  Dispense: 20 tablet; Refill: 0  - Take meds as prescribed - Use a cool mist humidifier  -Use saline nose sprays frequently -Force fluids -For any cough or congestion  Use plain Mucinex- regular strength or max strength is fine -For fever or aces or pains- take tylenol or ibuprofen. -Throat lozenges if help -Follow up if symptoms worsen or do not improve   Follow Up Instructions: I discussed the assessment and treatment plan with the patient. The patient was provided an opportunity to ask questions and all were answered. The patient agreed with the plan and demonstrated an understanding of the instructions.  A copy of instructions were sent to the patient via MyChart unless otherwise noted below.     The patient was advised to call back or seek an in-person evaluation if the symptoms worsen or if the condition fails to improve as anticipated.  Time:  I spent 8 minutes with the patient via telehealth technology discussing the above problems/concerns.    Evelina Dun, FNP

## 2022-10-23 DIAGNOSIS — M24542 Contracture, left hand: Secondary | ICD-10-CM | POA: Diagnosis not present

## 2022-10-23 DIAGNOSIS — S63408A Traumatic rupture of unspecified ligament of other finger at metacarpophalangeal and interphalangeal joint, initial encounter: Secondary | ICD-10-CM | POA: Diagnosis not present

## 2022-10-23 DIAGNOSIS — M65331 Trigger finger, right middle finger: Secondary | ICD-10-CM | POA: Diagnosis not present

## 2022-11-01 ENCOUNTER — Encounter: Payer: Self-pay | Admitting: Family Medicine

## 2022-11-01 ENCOUNTER — Telehealth (INDEPENDENT_AMBULATORY_CARE_PROVIDER_SITE_OTHER): Payer: Medicare HMO | Admitting: Family Medicine

## 2022-11-01 DIAGNOSIS — J0100 Acute maxillary sinusitis, unspecified: Secondary | ICD-10-CM

## 2022-11-01 MED ORDER — ONDANSETRON 8 MG PO TBDP: 8.0000 mg | ORAL_TABLET | Freq: Four times a day (QID) | ORAL | 1 refills | Status: DC | PRN

## 2022-11-01 MED ORDER — MOXIFLOXACIN HCL 400 MG PO TABS: 400.0000 mg | ORAL_TABLET | Freq: Every day | ORAL | 0 refills | Status: DC

## 2022-11-01 NOTE — Progress Notes (Signed)
Subjective:    Patient ID: Judy Evans, female    DOB: 09-Mar-1971, 52 y.o.   MRN: [id number]   HPI: Judy Evans is a 52 y.o. female presenting for recent tx for sinus. Ears still painful. Burning. Congestion . Also Nauseous. Coughing when she lays down. Producing purulent sputum.       09/05/2022    1:47 PM 09/05/2022    1:46 PM 02/07/2022   11:00 AM 01/06/2022    9:54 AM 01/04/2022    2:08 PM  Depression screen PHQ 2/9  Decreased Interest 0  1 1 0  Down, Depressed, Hopeless 0 0 0 1 0  PHQ - 2 Score 0 0 1 2 0  Altered sleeping 0 0 0 0   Tired, decreased energy 0 0 1 0   Change in appetite 0 0 0 0   Feeling bad or failure about yourself  0 0 0 1   Trouble concentrating 0 0 0 1   Moving slowly or fidgety/restless 0 0 0 0   Suicidal thoughts 0 0 0 0   PHQ-9 Score 0 0 2 4   Difficult doing work/chores Not difficult at all Not difficult at all Somewhat difficult Somewhat difficult      Relevant past medical, surgical, family and social history reviewed and updated as indicated.  Interim medical history since our last visit reviewed. Allergies and medications reviewed and updated.  ROS:  Review of Systems   Social History   Tobacco Use  Smoking Status Every Day   Packs/day: 1.50   Years: 30.00   Total pack years: 45.00   Types: Cigarettes  Smokeless Tobacco Never       Objective:     Wt Readings from Last 3 Encounters:  09/05/22 185 lb (83.9 kg)  06/05/22 195 lb (88.5 kg)  02/07/22 201 lb (91.2 kg)     Exam deferred. Pt. [place] due to COVID 19. Phone visit performed.   Assessment & Plan:   1. Acute maxillary sinusitis, recurrence not specified     Meds ordered this encounter  Medications   moxifloxacin (AVELOX) 400 MG tablet    Sig: Take 1 tablet (400 mg total) by mouth daily.    Dispense:  10 tablet    Refill:  0   ondansetron (ZOFRAN-ODT) 8 MG disintegrating tablet    Sig: Take 1 tablet (8 mg total) by mouth every 6 (six) hours  as needed for nausea or vomiting.    Dispense:  20 tablet    Refill:  1    No orders of the defined types were placed in this encounter.     Diagnoses and all orders for this visit:  Acute maxillary sinusitis, recurrence not specified  Other orders -     moxifloxacin (AVELOX) 400 MG tablet; Take 1 tablet (400 mg total) by mouth daily. -     ondansetron (ZOFRAN-ODT) 8 MG disintegrating tablet; Take 1 tablet (8 mg total) by mouth every 6 (six) hours as needed for nausea or vomiting.    Virtual Visit via telephone Note  I discussed the limitations, risks, security and privacy concerns of performing an evaluation and management service by telephone and the availability of in person appointments. The patient was identified with two identifiers. Pt.expressed understanding and agreed to proceed. Pt. Is at home. Dr. Livia Snellen is in his office.  Follow Up Instructions:   I discussed the assessment and treatment plan with the patient. The patient was provided an opportunity to  ask questions and all were answered. The patient agreed with the plan and demonstrated an understanding of the instructions.   The patient was advised to call back or seek an in-person evaluation if the symptoms worsen or if the condition fails to improve as anticipated.   Total minutes including chart review and phone contact time: 13   Follow up plan: Return if symptoms worsen or fail to improve.  Claretta Fraise, MD [place]

## 2022-11-23 ENCOUNTER — Encounter: Payer: Self-pay | Admitting: Radiology

## 2022-12-06 ENCOUNTER — Encounter: Payer: Self-pay | Admitting: Neurology

## 2022-12-06 ENCOUNTER — Ambulatory Visit (INDEPENDENT_AMBULATORY_CARE_PROVIDER_SITE_OTHER): Payer: Medicare HMO | Admitting: Neurology

## 2022-12-06 VITALS — BP 114/67 | HR 92 | Ht 62.0 in | Wt 188.0 lb

## 2022-12-06 DIAGNOSIS — G4489 Other headache syndrome: Secondary | ICD-10-CM | POA: Diagnosis not present

## 2022-12-06 DIAGNOSIS — M797 Fibromyalgia: Secondary | ICD-10-CM

## 2022-12-06 DIAGNOSIS — M5481 Occipital neuralgia: Secondary | ICD-10-CM

## 2022-12-06 DIAGNOSIS — M542 Cervicalgia: Secondary | ICD-10-CM | POA: Diagnosis not present

## 2022-12-06 DIAGNOSIS — M79662 Pain in left lower leg: Secondary | ICD-10-CM

## 2022-12-06 DIAGNOSIS — G43709 Chronic migraine without aura, not intractable, without status migrainosus: Secondary | ICD-10-CM | POA: Diagnosis not present

## 2022-12-06 MED ORDER — ONDANSETRON 8 MG PO TBDP: 8.0000 mg | ORAL_TABLET | Freq: Three times a day (TID) | ORAL | 3 refills | Status: DC | PRN

## 2022-12-06 MED ORDER — AJOVY 225 MG/1.5ML ~~LOC~~ SOSY: PREFILLED_SYRINGE | SUBCUTANEOUS | 3 refills | Status: DC

## 2022-12-06 MED ORDER — CELECOXIB 200 MG PO CAPS: ORAL_CAPSULE | ORAL | 3 refills | Status: DC

## 2022-12-06 MED ORDER — AMITRIPTYLINE HCL 25 MG PO TABS: ORAL_TABLET | ORAL | 3 refills | Status: DC

## 2022-12-06 MED ORDER — GABAPENTIN 800 MG PO TABS: 800.0000 mg | ORAL_TABLET | Freq: Four times a day (QID) | ORAL | 3 refills | Status: DC
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
## 2022-12-29 ENCOUNTER — Ambulatory Visit (INDEPENDENT_AMBULATORY_CARE_PROVIDER_SITE_OTHER): Payer: Medicare HMO | Admitting: Nurse Practitioner

## 2022-12-29 ENCOUNTER — Encounter: Payer: Self-pay | Admitting: Nurse Practitioner

## 2022-12-29 VITALS — BP 116/82 | HR 81 | Temp 97.8°F | Resp 20 | Ht 62.0 in | Wt 188.0 lb

## 2022-12-29 DIAGNOSIS — N898 Other specified noninflammatory disorders of vagina: Secondary | ICD-10-CM | POA: Diagnosis not present

## 2022-12-29 DIAGNOSIS — M79602 Pain in left arm: Secondary | ICD-10-CM | POA: Diagnosis not present

## 2022-12-29 DIAGNOSIS — B3731 Acute candidiasis of vulva and vagina: Secondary | ICD-10-CM

## 2022-12-29 LAB — MICROSCOPIC EXAMINATION: RBC, Urine: NONE SEEN /hpf (ref 0–2)

## 2022-12-29 LAB — URINALYSIS, COMPLETE
Bilirubin, UA: NEGATIVE
Glucose, UA: NEGATIVE
Ketones, UA: NEGATIVE
Nitrite, UA: NEGATIVE
Protein,UA: NEGATIVE
RBC, UA: NEGATIVE
Specific Gravity, UA: <1.005 — ABNORMAL LOW (ref 1.005–1.030)
Urobilinogen, Ur: 0.2 mg/dL (ref 0.2–1.0)
pH, UA: 5.5 (ref 5.0–7.5)

## 2022-12-29 MED ORDER — FLUCONAZOLE 150 MG PO TABS
150.0000 mg | ORAL_TABLET | Freq: Once | ORAL | 0 refills | Status: AC
Start: 2022-12-29 — End: 2022-12-29

## 2022-12-29 NOTE — Patient Instructions (Signed)

## 2022-12-29 NOTE — Progress Notes (Signed)
Subjective:    Patient ID: Judy Evans, female    DOB: 1971/06/30, 52 y.o.   MRN: [id number]   Chief Complaint: Spot on left upper arm (Worried its a blood clot), Allergies, and Vaginal itching and burning with urination   HPI Patient in with 2 complaints: -Patient comes in c/o of a "knot" under the skin on left forearm. Noticd it yesterday. Slightly tender to touch. Denies any injury. She has been diagnosed with CRPS of left arm in the past - vaginal itching and slight burning. Urinary frequecny with urgency. Started about 1 week ago. Has gradually worsened. She has been using OTC yeast cream. No relief.  Patient Active Problem List   Diagnosis Date Noted   Precordial chest pain 01/11/2022   Complex regional pain syndrome type 1 of left upper extremity 03/04/2021   Subacromial bursitis of right shoulder joint 11/24/2020   Trochanteric bursitis of right hip 11/24/2020   Pain of left lower leg 11/15/2020   Bilateral low back pain with right-sided sciatica 11/15/2020   Thrush 11/15/2020   Insomnia 11/19/2018   Other headache syndrome 07/18/2018   Occipital neuralgia 07/18/2018   Family history of cerebral aneurysm 07/18/2018   Numbness 04/23/2018   Cervical radiculopathy 04/23/2018   Encounter for long-term use of opiate analgesic 04/22/2018   Lumbar degenerative disc disease 04/22/2018   Spondylosis of lumbar spine 04/22/2018   Fibromyalgia affecting multiple sites 04/22/2018   Constipation due to pain medication 04/30/2017   Depression 03/22/2017   Trochanteric bursitis of left hip 03/13/2017   Neck pain 11/13/2016   Left shoulder pain 11/13/2016   Chronic bilateral low back pain 11/13/2016   History of colonic polyps 06/20/2016   IBS (irritable bowel syndrome) 06/20/2016   Epigastric pain 06/20/2016   Rhinitis, chronic 04/13/2016   Otalgia of both ears 04/13/2016   Arthralgia of right temporomandibular joint 04/13/2016   BMI 40.0-44.9, adult 11/22/2015    Bilateral carpal tunnel syndrome 11/22/2015   Tobacco abuse 11/22/2015   Allergic rhinitis 11/01/2015   Gastroesophageal reflux disease 11/01/2015   LLQ pain 06/18/2015   Bloating 06/18/2015   Pain with urination 02/19/2015   Moody 01/22/2015   Pelvic pain in female 01/13/2015   Dysuria 01/13/2015   Hot flashes due to surgical menopause 01/13/2015   Pre-diabetes 01/13/2015   Hypertension 01/13/2015   Chronic migraine w/o aura, not intractable, w/o stat migr 01/13/2015   Psoriasis 01/13/2015   Arthritis 01/13/2015   Fibromyalgia 01/13/2015   COPD (chronic obstructive pulmonary disease) 01/13/2015   Hyperlipidemia 01/13/2015   Abnormal nuclear stress test 01/07/2014   Abdominal pain 04/16/2013   Pain in joint involving multiple sites 02/20/2013   Head trauma 02/20/2013       Review of Systems  Constitutional:  Negative for diaphoresis.  Eyes:  Negative for pain.  Respiratory:  Negative for shortness of breath.   Cardiovascular:  Negative for chest pain, palpitations and leg swelling.  Gastrointestinal:  Negative for abdominal pain.  Endocrine: Negative for polydipsia.  Skin:  Negative for rash.  Neurological:  Negative for dizziness, weakness and headaches.  Hematological:  Does not bruise/bleed easily.  All other systems reviewed and are negative.      Objective:   Physical Exam Constitutional:      Appearance: Normal appearance.  Cardiovascular:     Rate and Rhythm: Normal rate.     Heart sounds: Normal heart sounds.  Pulmonary:     Effort: Pulmonary effort is normal.     Breath  sounds: Normal breath sounds.  Skin:    Comments: Nodularity of left forearm with mild tenderness.-   Neurological:     General: No focal deficit present.     Mental Status: She is alert and oriented to person, place, and time.  Psychiatric:        Mood and Affect: Mood normal.        Behavior: Behavior normal.    BP 116/82   Pulse 81   Temp 97.8 F (36.6 C) (Temporal)   Resp  20   Ht 5\' 2"  (1.575 m)   Wt 188 lb (85.3 kg)   SpO2 97%   BMI 34.39 kg/m   Urine clear      Assessment & Plan:   Judy Evans in today with chief complaint of Spot on left upper arm (Worried its a blood clot), Allergies, and Vaginal itching and burning with urination   1. Vaginal irritation - Urinalysis, Complete - Urine Culture  2. Vaginal candidiasis Avoid scratching Avoid bubble baths - fluconazole (DIFLUCAN) 150 MG tablet; Take 1 tablet (150 mg total) by mouth once for 1 dose.  Dispense: 1 tablet; Refill: 0  3. Pain of left upper extremity Moist heat Motrin as needed Rto if not improving    The above assessment and management plan was discussed with the patient. The patient verbalized understanding of and has agreed to the management plan. Patient is aware to call the clinic if symptoms persist or worsen. Patient is aware when to return to the clinic for a follow-up visit. Patient educated on when it is appropriate to go to the emergency department.   Mary-Margaret Daphine DeutscherMartin, FNP

## 2023-01-01 LAB — URINE CULTURE

## 2023-01-10 ENCOUNTER — Ambulatory Visit (INDEPENDENT_AMBULATORY_CARE_PROVIDER_SITE_OTHER): Payer: Medicare HMO

## 2023-01-10 VITALS — Ht 62.0 in | Wt 187.0 lb

## 2023-01-10 DIAGNOSIS — Z0000 Encounter for general adult medical examination without abnormal findings: Secondary | ICD-10-CM

## 2023-01-10 DIAGNOSIS — Z122 Encounter for screening for malignant neoplasm of respiratory organs: Secondary | ICD-10-CM

## 2023-01-10 DIAGNOSIS — Z0100 Encounter for examination of eyes and vision without abnormal findings: Secondary | ICD-10-CM

## 2023-01-10 NOTE — Patient Instructions (Signed)
Judy Evans , Thank you for taking time to come for your Medicare Wellness Visit. I appreciate your ongoing commitment to your health goals. Please review the following plan we discussed and let me know if I can assist you in the future.   These are the goals we discussed:  Goals      DIET - EAT MORE FRUITS AND VEGETABLES     Exercise 150 min/wk Moderate Activity     01/04/2022-Would like to increase exercise and travel more.     Patient Stated     11/30/2020 AWV Goal: Exercise for General Health  Patient will verbalize understanding of the benefits of increased physical activity: Exercising regularly is important. It will improve your overall fitness, flexibility, and endurance. Regular exercise also will improve your overall health. It can help you control your weight, reduce stress, and improve your bone density. Over the next year, patient will increase physical activity as tolerated with a goal of at least 150 minutes of moderate physical activity per week.  You can tell that you are exercising at a moderate intensity if your heart starts beating faster and you start breathing faster but can still hold a conversation. Moderate-intensity exercise ideas include: Walking 1 mile (1.6 km) in about 15 minutes Biking Hiking Golfing Dancing Water aerobics Patient will verbalize understanding of everyday activities that increase physical activity by providing examples like the following: Yard work, such as: Insurance underwriter Gardening Washing windows or floors Patient will be able to explain general safety guidelines for exercising:  Before you start a new exercise program, talk with your health care provider. Do not exercise so much that you hurt yourself, feel dizzy, or get very short of breath. Wear comfortable clothes and wear shoes with good support. Drink plenty of water while you exercise to prevent  dehydration or heat stroke. Work out until your breathing and your heartbeat get faster.         This is a list of the screening recommended for you and due dates:  Health Maintenance  Topic Date Due   COVID-19 Vaccine (1) Never done   Zoster (Shingles) Vaccine (1 of 2) Never done   Screening for Lung Cancer  02/04/2023   Hepatitis C Screening: USPSTF Recommendation to screen - Ages 18-79 yo.  02/08/2023*   HIV Screening  02/08/2023*   Mammogram  01/26/2023   Flu Shot  04/26/2023   Medicare Annual Wellness Visit  01/10/2024   Colon Cancer Screening  07/14/2024   DTaP/Tdap/Td vaccine (3 - Td or Tdap) 06/15/2027   HPV Vaccine  Aged Out  *Topic was postponed. The date shown is not the original due date.    Advanced directives: Advance directive discussed with you today. I have provided a copy for you to complete at home and have notarized. Once this is complete please bring a copy in to our office so we can scan it into your chart.   Conditions/risks identified: Aim for 30 minutes of exercise or brisk walking, 6-8 glasses of water, and 5 servings of fruits and vegetables each day.   Next appointment: Follow up in one year for your annual wellness visit.   Preventive Care 40-64 Years, Female Preventive care refers to lifestyle choices and visits with your health care provider that can promote health and wellness. What does preventive care include? A yearly physical exam. This is also called an annual well check. Dental exams  once or twice a year. Routine eye exams. Ask your health care provider how often you should have your eyes checked. Personal lifestyle choices, including: Daily care of your teeth and gums. Regular physical activity. Eating a healthy diet. Avoiding tobacco and drug use. Limiting alcohol use. Practicing safe sex. Taking low-dose aspirin daily starting at age 83. Taking vitamin and mineral supplements as recommended by your health care provider. What happens  during an annual well check? The services and screenings done by your health care provider during your annual well check will depend on your age, overall health, lifestyle risk factors, and family history of disease. Counseling  Your health care provider may ask you questions about your: Alcohol use. Tobacco use. Drug use. Emotional well-being. Home and relationship well-being. Sexual activity. Eating habits. Work and work Astronomer. Method of birth control. Menstrual cycle. Pregnancy history. Screening  You may have the following tests or measurements: Height, weight, and BMI. Blood pressure. Lipid and cholesterol levels. These may be checked every 5 years, or more frequently if you are over 24 years old. Skin check. Lung cancer screening. You may have this screening every year starting at age 43 if you have a 30-pack-year history of smoking and currently smoke or have quit within the past 15 years. Fecal occult blood test (FOBT) of the stool. You may have this test every year starting at age 59. Flexible sigmoidoscopy or colonoscopy. You may have a sigmoidoscopy every 5 years or a colonoscopy every 10 years starting at age 42. Hepatitis C blood test. Hepatitis B blood test. Sexually transmitted disease (STD) testing. Diabetes screening. This is done by checking your blood sugar (glucose) after you have not eaten for a while (fasting). You may have this done every 1-3 years. Mammogram. This may be done every 1-2 years. Talk to your health care provider about when you should start having regular mammograms. This may depend on whether you have a family history of breast cancer. BRCA-related cancer screening. This may be done if you have a family history of breast, ovarian, tubal, or peritoneal cancers. Pelvic exam and Pap test. This may be done every 3 years starting at age 28. Starting at age 75, this may be done every 5 years if you have a Pap test in combination with an HPV  test. Bone density scan. This is done to screen for osteoporosis. You may have this scan if you are at high risk for osteoporosis. Discuss your test results, treatment options, and if necessary, the need for more tests with your health care provider. Vaccines  Your health care provider may recommend certain vaccines, such as: Influenza vaccine. This is recommended every year. Tetanus, diphtheria, and acellular pertussis (Tdap, Td) vaccine. You may need a Td booster every 10 years. Zoster vaccine. You may need this after age 8. Pneumococcal 13-valent conjugate (PCV13) vaccine. You may need this if you have certain conditions and were not previously vaccinated. Pneumococcal polysaccharide (PPSV23) vaccine. You may need one or two doses if you smoke cigarettes or if you have certain conditions. Talk to your health care provider about which screenings and vaccines you need and how often you need them. This information is not intended to replace advice given to you by your health care provider. Make sure you discuss any questions you have with your health care provider. Document Released: 10/08/2015 Document Revised: 05/31/2016 Document Reviewed: 07/13/2015 Elsevier Interactive Patient Education  2017 [employer].    Fall Prevention in the Home Falls can cause  injuries. They can happen to people of all ages. There are many things you can do to make your home safe and to help prevent falls. What can I do on the outside of my home? Regularly fix the edges of walkways and driveways and fix any cracks. Remove anything that might make you trip as you walk through a door, such as a raised step or threshold. Trim any bushes or trees on the path to your home. Use bright outdoor lighting. Clear any walking paths of anything that might make someone trip, such as rocks or tools. Regularly check to see if handrails are loose or broken. Make sure that both sides of any steps have handrails. Any raised  decks and porches should have guardrails on the edges. Have any leaves, snow, or ice cleared regularly. Use sand or salt on walking paths during winter. Clean up any spills in your garage right away. This includes oil or grease spills. What can I do in the bathroom? Use night lights. Install grab bars by the toilet and in the tub and shower. Do not use towel bars as grab bars. Use non-skid mats or decals in the tub or shower. If you need to sit down in the shower, use a plastic, non-slip stool. Keep the floor dry. Clean up any water that spills on the floor as soon as it happens. Remove soap buildup in the tub or shower regularly. Attach bath mats securely with double-sided non-slip rug tape. Do not have throw rugs and other things on the floor that can make you trip. What can I do in the bedroom? Use night lights. Make sure that you have a light by your bed that is easy to reach. Do not use any sheets or blankets that are too big for your bed. They should not hang down onto the floor. Have a firm chair that has side arms. You can use this for support while you get dressed. Do not have throw rugs and other things on the floor that can make you trip. What can I do in the kitchen? Clean up any spills right away. Avoid walking on wet floors. Keep items that you use a lot in easy-to-reach places. If you need to reach something above you, use a strong step stool that has a grab bar. Keep electrical cords out of the way. Do not use floor polish or wax that makes floors slippery. If you must use wax, use non-skid floor wax. Do not have throw rugs and other things on the floor that can make you trip. What can I do with my stairs? Do not leave any items on the stairs. Make sure that there are handrails on both sides of the stairs and use them. Fix handrails that are broken or loose. Make sure that handrails are as long as the stairways. Check any carpeting to make sure that it is firmly attached  to the stairs. Fix any carpet that is loose or worn. Avoid having throw rugs at the top or bottom of the stairs. If you do have throw rugs, attach them to the floor with carpet tape. Make sure that you have a light switch at the top of the stairs and the bottom of the stairs. If you do not have them, ask someone to add them for you. What else can I do to help prevent falls? Wear shoes that: Do not have high heels. Have rubber bottoms. Are comfortable and fit you well. Are closed at the toe. Do  not wear sandals. If you use a stepladder: Make sure that it is fully opened. Do not climb a closed stepladder. Make sure that both sides of the stepladder are locked into place. Ask someone to hold it for you, if possible. Clearly mark and make sure that you can see: Any grab bars or handrails. First and last steps. Where the edge of each step is. Use tools that help you move around (mobility aids) if they are needed. These include: Canes. Walkers. Scooters. Crutches. Turn on the lights when you go into a dark area. Replace any light bulbs as soon as they burn out. Set up your furniture so you have a clear path. Avoid moving your furniture around. If any of your floors are uneven, fix them. If there are any pets around you, be aware of where they are. Review your medicines with your doctor. Some medicines can make you feel dizzy. This can increase your chance of falling. Ask your doctor what other things that you can do to help prevent falls. This information is not intended to replace advice given to you by your health care provider. Make sure you discuss any questions you have with your health care provider. Document Released: 07/08/2009 Document Revised: 02/17/2016 Document Reviewed: 10/16/2014 Elsevier Interactive Patient Education  2017 [employer].

## 2023-01-10 NOTE — Progress Notes (Signed)
Subjective:   Judy Evans is a 52 y.o. female who presents for Medicare Annual (Subsequent) preventive examination. I connected with  Trinda Pascal on 01/10/23 by a audio enabled telemedicine application and verified that I am speaking with the correct person using two identifiers.  Patient Location: Home  Provider Location: Home Office  I discussed the limitations of evaluation and management by telemedicine. The patient expressed understanding and agreed to proceed.  Review of Systems     Cardiac Risk Factors include: dyslipidemia;hypertension;none     Objective:    Today's Vitals   01/10/23 1352  Weight: 187 lb (84.8 kg)  Height: 5\' 2"  (1.575 m)   Body mass index is 34.2 kg/m.     01/10/2023    1:56 PM 01/04/2022    2:15 PM 12/01/2021   10:49 AM 11/22/2021   12:35 PM 01/12/2021    5:33 PM 11/30/2020    1:20 PM 11/24/2020    6:27 PM  Advanced Directives  Does Patient Have a Medical Advance Directive? No No No No No No No  Would patient like information on creating a medical advance directive? No - Patient declined No - Patient declined   No - Patient declined No - Patient declined     Current Medications (verified) Outpatient Encounter Medications as of 01/10/2023  Medication Sig   albuterol (VENTOLIN HFA) 108 (90 Base) MCG/ACT inhaler USE 2 PUFFS BY MOUTH EVERY 4 HOURS AS NEEDED FOR WHEEZING OR SHORTNESS OF BREATH   amitriptyline (ELAVIL) 25 MG tablet Two po qHS   celecoxib (CELEBREX) 200 MG capsule one po qd   cetirizine (ZYRTEC) 10 MG tablet Take 1 tablet (10 mg total) by mouth daily.   COSENTYX SENSOREADY, 300 MG, 150 MG/ML SOAJ    fish oil-omega-3 fatty acids 1000 MG capsule Take 2 capsules (2 g total) by mouth daily.   fluticasone (FLONASE) 50 MCG/ACT nasal spray Administer 2 sprays in each nostril daily.   Fremanezumab-vfrm (AJOVY) 225 MG/1.5ML SOSY INJECT 1 SYRINGE UNDER THE SKIN EVERY 28 DAYS.   gabapentin (NEURONTIN) 800 MG tablet Take 1 tablet (800  mg total) by mouth 4 (four) times daily. (Needs to be seen before next refill)   ipratropium-albuterol (DUONEB) 0.5-2.5 (3) MG/3ML SOLN INHALE 3 ML BY NEBULIZER EVERY 6 HOURS AS NEEDED   lidocaine (LIDODERM) 5 % Place 1 patch onto the skin daily. Remove & Discard patch within 12 hours or as directed by MD   nystatin ointment (MYCOSTATIN) Apply 1 application topically 2 (two) times daily as needed.   omeprazole (PRILOSEC) 40 MG capsule TAKE 1 CAPSULE BY MOUTH EVERY DAY   ondansetron (ZOFRAN-ODT) 8 MG disintegrating tablet Take 1 tablet (8 mg total) by mouth every 8 (eight) hours as needed for nausea or vomiting.   triamcinolone cream (KENALOG) 0.1 %    triamcinolone ointment (KENALOG) 0.5 % Apply topically 2 (two) times daily.   nitroGLYCERIN (NITROSTAT) 0.4 MG SL tablet Place 1 tablet (0.4 mg total) under the tongue every 5 (five) minutes as needed for chest pain.   No facility-administered encounter medications on file as of 01/10/2023.    Allergies (verified) Rosuvastatin calcium, Statins, Wellbutrin [bupropion], Hctz [hydrochlorothiazide], Hydrocodone-acetaminophen, Oxycodone, and Penicillins   History: Past Medical History:  Diagnosis Date   Anxiety    Aphasia    Arthritis    COPD (chronic obstructive pulmonary disease)    CRPS (complex regional pain syndrome type I)    DDD (degenerative disc disease), cervical    Degenerative  disc disease at L5-S1 level    Fibromyalgia    GERD (gastroesophageal reflux disease)    IBS (irritable bowel syndrome)    Migraines    Moody 01/22/2015   Psoriasis    Scoliosis    Spondylolysis    Past Surgical History:  Procedure Laterality Date   ABDOMINAL HYSTERECTOMY     BIOPSY  09/14/2016   Procedure: BIOPSY;  Surgeon: Corbin Ade, MD;  Location: [facility];  Service: Endoscopy;;  gastric    CARDIAC CATHETERIZATION  2015   patient reported it to be normal. "I was dying in pain during procedure".    CARPAL TUNNEL RELEASE Right     CESAREAN SECTION     CHOLECYSTECTOMY     COLONOSCOPY  06/2014   Vernia Buff DeMason: normal   DILATION AND CURETTAGE OF UTERUS     ESOPHAGOGASTRODUODENOSCOPY  07/2014   Erskine Speed: Normal   ESOPHAGOGASTRODUODENOSCOPY (EGD) WITH PROPOFOL N/A 09/14/2016   Dr. Jena Gauss: Normal esophagus status post empiric dilation, erosive gastropathy with biopsies showing chronic inactive gastritis, no H pylori.   EXTERNAL FIXATION OF FINGER Left 12/01/2021   Procedure: LEFT RING DIGIT WIDGET PLACEMENT;  Surgeon: Betha Loa, MD;  Location: [facility];  Service: Orthopedics;  Laterality: Left;   FOOT SURGERY Bilateral    removal of heel spurs   MALONEY DILATION  09/14/2016   Procedure: MALONEY DILATION;  Surgeon: Corbin Ade, MD;  Location: [facility];  Service: Endoscopy;;   rotator cuff surgery Right    TONSILLECTOMY     Family History  Problem Relation Age of Onset   COPD Mother    Diabetes Mother    Osteoarthritis Mother    Atrial fibrillation Mother    Healthy Father    Atrial fibrillation Father    Lupus Sister    CAD Sister 72       CABG   Diabetes Sister    Factor V Leiden deficiency Sister    Arthritis Sister    Psoriasis Sister    Asthma Daughter    Cancer Maternal Grandmother 80       colon   Arthritis Maternal Grandmother    Diabetes Maternal Grandmother    Dementia Maternal Grandmother    Other Maternal Grandfather        brain tumor   Breast cancer Paternal Grandmother    Cancer Paternal Grandmother        breast   Cancer Paternal Grandfather        lung   Other Paternal Grandfather        brain aneurysm   Early death Brother        pneumonia   Asthma Son    Social History   Socioeconomic History   Marital status: Legally Separated    Spouse name: Not on file   Number of children: 2   Years of education: GED   Highest education level: GED or equivalent  Occupational History   Occupation: Disabled  Tobacco Use   Smoking status: Every Day     Packs/day: 1.50    Years: 30.00    Additional pack years: 0.00    Total pack years: 45.00    Types: Cigarettes   Smokeless tobacco: Never  Vaping Use   Vaping Use: Former  Substance and Sexual Activity   Alcohol use: No    Comment: rarely   Drug use: Yes    Types: Marijuana   Sexual activity: Not Currently    Birth control/protection:  Surgical    Comment: hyst  Other Topics Concern   Not on file  Social History Narrative   Caffeine use: Drinks coffee (2 cups per day), tea/soda- 2-3 cups per day   Right handed   Lives alone.   Lost oldest sister 07/2021 due to cardiac issues. Youngest sister died Sep 20, 2021 due to unknown causes.    Social Determinants of Health   Financial Resource Strain: Low Risk  (01/10/2023)   Overall Financial Resource Strain (CARDIA)    Difficulty of Paying Living Expenses: Not hard at all  Food Insecurity: No Food Insecurity (01/10/2023)   Hunger Vital Sign    Worried About Running Out of Food in the Last Year: Never true    Ran Out of Food in the Last Year: Never true  Transportation Needs: No Transportation Needs (01/10/2023)   PRAPARE - Administrator, Civil Service (Medical): No    Lack of Transportation (Non-Medical): No  Physical Activity: Insufficiently Active (01/10/2023)   Exercise Vital Sign    Days of Exercise per Week: 2 days    Minutes of Exercise per Session: 30 min  Stress: No Stress Concern Present (01/10/2023)   [employer] of Occupational Health - Occupational Stress Questionnaire    Feeling of Stress : Not at all  Social Connections: Socially Isolated (01/10/2023)   Social Connection and Isolation Panel [NHANES]    Frequency of Communication with Friends and Family: More than three times a week    Frequency of Social Gatherings with Friends and Family: More than three times a week    Attends Religious Services: Never    Database administrator or Organizations: No    Attends Engineer, structural: Never     Marital Status: Separated    Tobacco Counseling Ready to quit: No Counseling given: Not Answered   Clinical Intake:  Pre-visit preparation completed: Yes  Pain : No/denies pain     Nutritional Risks: None Diabetes: No  How often do you need to have someone help you when you read instructions, pamphlets, or other written materials from your doctor or pharmacy?: 1 - Never  Diabetic?no   Interpreter Needed?: No  Information entered by :: Renie Ora, LPN   Activities of Daily Living    01/10/2023    1:56 PM  In your present state of health, do you have any difficulty performing the following activities:  Hearing? 0  Vision? 0  Difficulty concentrating or making decisions? 0  Walking or climbing stairs? 0  Dressing or bathing? 0  Doing errands, shopping? 0  Preparing Food and eating ? N  Using the Toilet? N  In the past six months, have you accidently leaked urine? N  Do you have problems with loss of bowel control? N  Managing your Medications? N  Managing your Finances? N  Housekeeping or managing your Housekeeping? N    Patient Care Team: Sonny Masters, FNP as PCP - General (Family Medicine) Rollene Rotunda, MD as PCP - Cardiology (Cardiology) Jena Gauss Gerrit Friends, MD as Consulting Physician (Gastroenterology) Marcelino Duster, MD as Referring Physician (Dermatology) Rakes, Doralee Albino, FNP as Nurse Practitioner (Family Medicine)  Indicate any recent Medical Services you may have received from other than [facility] providers in the past year (date may be approximate).     Assessment:   This is a routine wellness examination for Collins.  Hearing/Vision screen Vision Screening - Comments:: Referral 01/10/2023  Dietary issues and exercise activities discussed: Current Exercise Habits: Home exercise  routine, Type of exercise: walking, Time (Minutes): 30, Frequency (Times/Week): 2, Weekly Exercise (Minutes/Week): 60, Exercise limited by: orthopedic condition(s)    Goals Addressed             This Visit's Progress    DIET - EAT MORE FRUITS AND VEGETABLES   On track      Depression Screen    01/10/2023    1:55 PM 12/29/2022    9:19 AM 09/05/2022    1:47 PM 09/05/2022    1:46 PM 02/07/2022   11:00 AM 01/06/2022    9:54 AM 01/04/2022    2:08 PM  PHQ 2/9 Scores  PHQ - 2 Score 0 2 0 0 1 2 0  PHQ- 9 Score 0 9 0 0 2 4     Fall Risk    01/10/2023    1:54 PM 12/29/2022    9:18 AM 09/05/2022    1:46 PM 02/07/2022   10:59 AM 01/06/2022    9:54 AM  Fall Risk   Falls in the past year? 0 1 0 0 1  Number falls in past yr: 0 0 0 0 0  Injury with Fall? 0 0 0 0 0  Risk for fall due to : No Fall Risks History of fall(s) No Fall Risks    Follow up Falls prevention discussed Education provided  Falls evaluation completed     FALL RISK PREVENTION PERTAINING TO THE HOME:  Any stairs in or around the home? No  If so, are there any without handrails? No  Home free of loose throw rugs in walkways, pet beds, electrical cords, etc? Yes  Adequate lighting in your home to reduce risk of falls? Yes   ASSISTIVE DEVICES UTILIZED TO PREVENT FALLS:  Life alert? No  Use of a cane, walker or w/c? No  Grab bars in the bathroom? No  Shower chair or bench in shower? No  Elevated toilet seat or a handicapped toilet? No       01/02/2018    3:17 PM  MMSE - Mini Mental State Exam  Orientation to time 5  Orientation to Place 5  Registration 3  Attention/ Calculation 5  Recall 3  Language- name 2 objects 2  Language- repeat 1  Language- follow 3 step command 3  Language- read & follow direction 1  Write a sentence 1  Copy design 1  Total score 30        01/10/2023    1:56 PM 11/30/2020    1:21 PM 11/11/2019    2:29 PM  6CIT Screen  What Year? 0 points 0 points 0 points  What month? 0 points 0 points 0 points  What time? 0 points 0 points 0 points  Count back from 20 0 points 0 points 0 points  Months in reverse 0 points 0 points 0 points  Repeat phrase  0 points 0 points 0 points  Total Score 0 points 0 points 0 points    Immunizations Immunization History  Administered Date(s) Administered   Influenza Split 06/20/2011   Influenza,inj,Quad PF,6+ Mos 08/24/2017, 06/20/2018   Influenza-Unspecified 06/10/2013, 11/01/2015   PPD Test 03/22/2018   Pneumococcal Polysaccharide-23 06/20/2011, 08/24/2017   Td 06/10/2004   Tdap 06/14/2017    TDAP status: Up to date  Flu Vaccine status: Declined, Education has been provided regarding the importance of this vaccine but patient still declined. Advised may receive this vaccine at local pharmacy or Health Dept. Aware to provide a copy of the vaccination record if  obtained from local pharmacy or Health Dept. Verbalized acceptance and understanding.  Pneumococcal vaccine status: Up to date  Covid-19 vaccine status: Declined, Education has been provided regarding the importance of this vaccine but patient still declined. Advised may receive this vaccine at local pharmacy or Health Dept.or vaccine clinic. Aware to provide a copy of the vaccination record if obtained from local pharmacy or Health Dept. Verbalized acceptance and understanding.  Qualifies for Shingles Vaccine? Yes   Zostavax completed No   Shingrix Completed?: No.    Education has been provided regarding the importance of this vaccine. Patient has been advised to call insurance company to determine out of pocket expense if they have not yet received this vaccine. Advised may also receive vaccine at local pharmacy or Health Dept. Verbalized acceptance and understanding.  Screening Tests Health Maintenance  Topic Date Due   COVID-19 Vaccine (1) Never done   Zoster Vaccines- Shingrix (1 of 2) Never done   Lung Cancer Screening  02/04/2023   Hepatitis C Screening  02/08/2023 (Originally 01/22/1989)   HIV Screening  02/08/2023 (Originally 01/23/1987)   MAMMOGRAM  01/26/2023   INFLUENZA VACCINE  04/26/2023   Medicare Annual Wellness (AWV)   01/10/2024   COLONOSCOPY (Pts 45-74yrs Insurance coverage will need to be confirmed)  07/14/2024   DTaP/Tdap/Td (3 - Td or Tdap) 06/15/2027   HPV VACCINES  Aged Out    Health Maintenance  Health Maintenance Due  Topic Date Due   COVID-19 Vaccine (1) Never done   Zoster Vaccines- Shingrix (1 of 2) Never done   Lung Cancer Screening  02/04/2023    Colorectal cancer screening: Type of screening: Colonoscopy. Completed 07/14/2014. Repeat every 10 years  Mammogram status: Completed 01/25/2022. Repeat every year  Bone Density status: Ordered not of age . Pt provided with contact info and advised to call to schedule appt.  Lung Cancer Screening: (Low Dose CT Chest recommended if Age 45-80 years, 30 pack-year currently smoking OR have quit w/in 15years.) does qualify.   Lung Cancer Screening Referral: referral 01/10/2023  Additional Screening:  Hepatitis C Screening: does not qualify;   Vision Screening: Recommended annual ophthalmology exams for early detection of glaucoma and other disorders of the eye. Is the patient up to date with their annual eye exam?  No  Who is the provider or what is the name of the office in which the patient attends annual eye exams? None referral 01/10/2023 If pt is not established with a provider, would they like to be referred to a provider to establish care? No .   Dental Screening: Recommended annual dental exams for proper oral hygiene  Community Resource Referral / Chronic Care Management: CRR required this visit?  No   CCM required this visit?  No      Plan:     I have personally reviewed and noted the following in the patient's chart:   Medical and social history Use of alcohol, tobacco or illicit drugs  Current medications and supplements including opioid prescriptions. Patient is not currently taking opioid prescriptions. Functional ability and status Nutritional status Physical activity Advanced directives List of other  physicians Hospitalizations, surgeries, and ER visits in previous 12 months Vitals Screenings to include cognitive, depression, and falls Referrals and appointments  In addition, I have reviewed and discussed with patient certain preventive protocols, quality metrics, and best practice recommendations. A written personalized care plan for preventive services as well as general preventive health recommendations were provided to patient.  Lorrene Reid, LPN   [id number]/61/0960   Nurse Notes: Patient wants to discuss Colonoscopy with PCP

## 2023-02-09 IMAGING — MR MR SHOULDER*L* W/O CM
4 of 5 series · 17 of 40 positions shown · non-contrast
Comparison: None.

CLINICAL DATA: Left shoulder pain

EXAM:
MRI OF THE LEFT SHOULDER WITHOUT CONTRAST
TECHNIQUE: Multiplanar, multisequence MR imaging of the shoulder was performed.
No intravenous contrast was administered.

[Series 7: T2 fat-sat · oblique · left · 4.0mm · 0.22mm/px · 4 of 21 slices shown (1 of 3)]
[im 1/21]
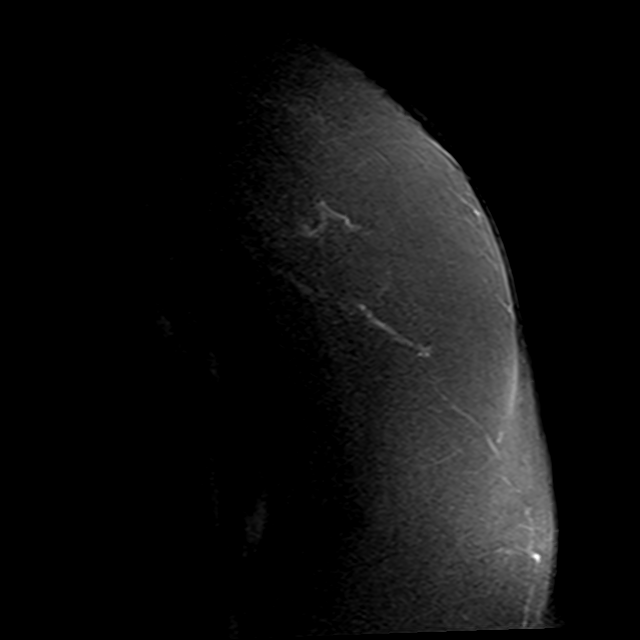
[im 3/21]
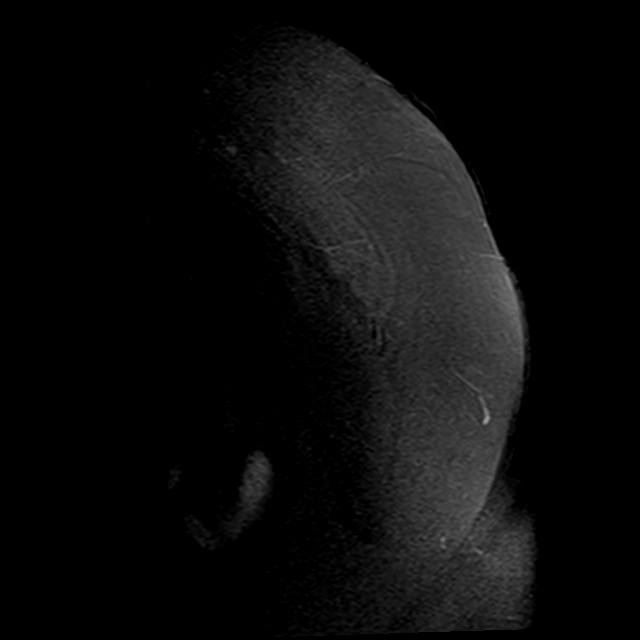
[im 12/21]
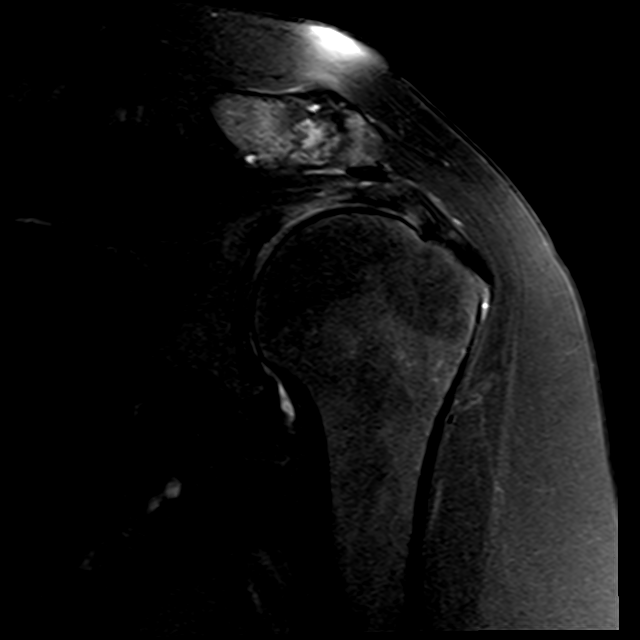
[im 18/21]
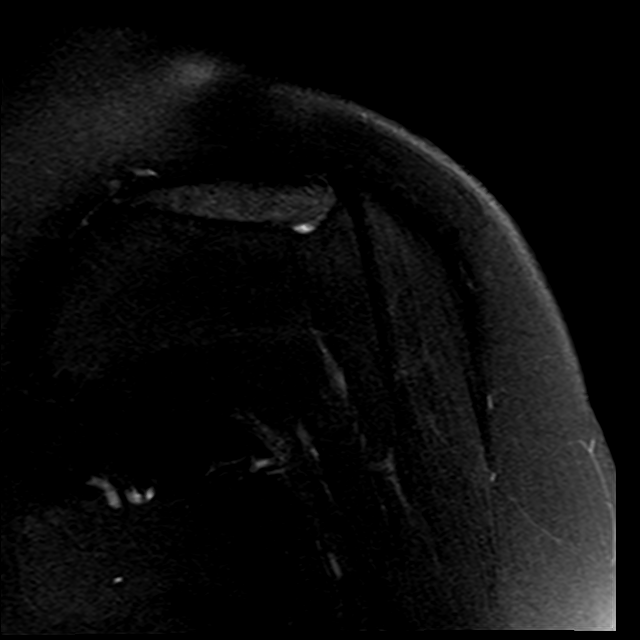

[Series 8: PD · oblique · left · 4.0mm · 0.22mm/px · 7 of 21 slices shown]
[im 1/21]
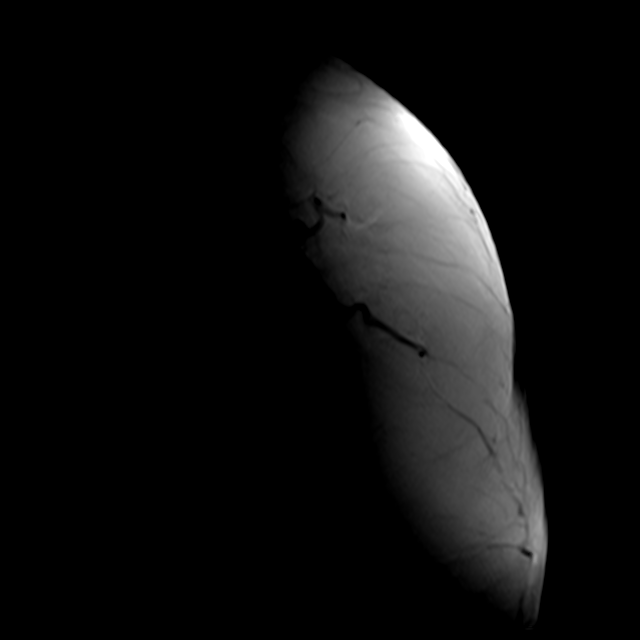
[im 4/21]
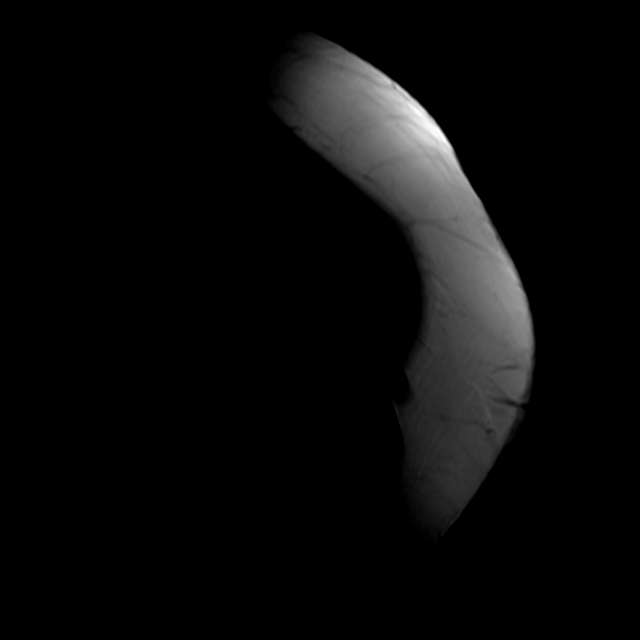
[im 7/21]
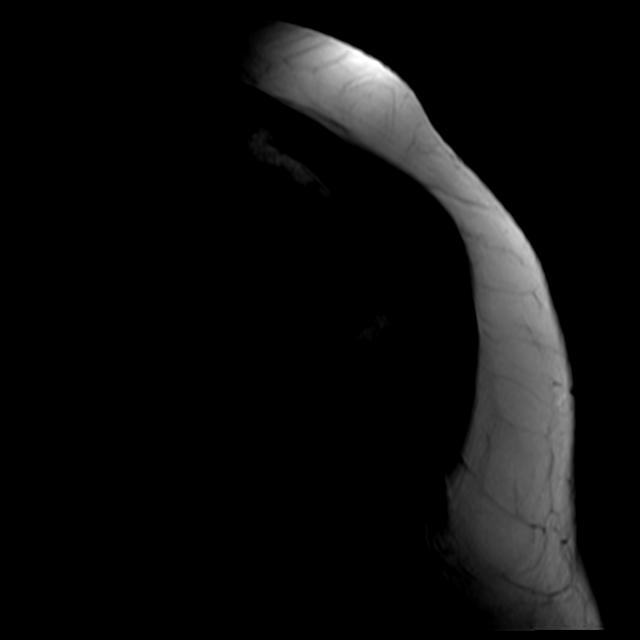
[im 11/21]
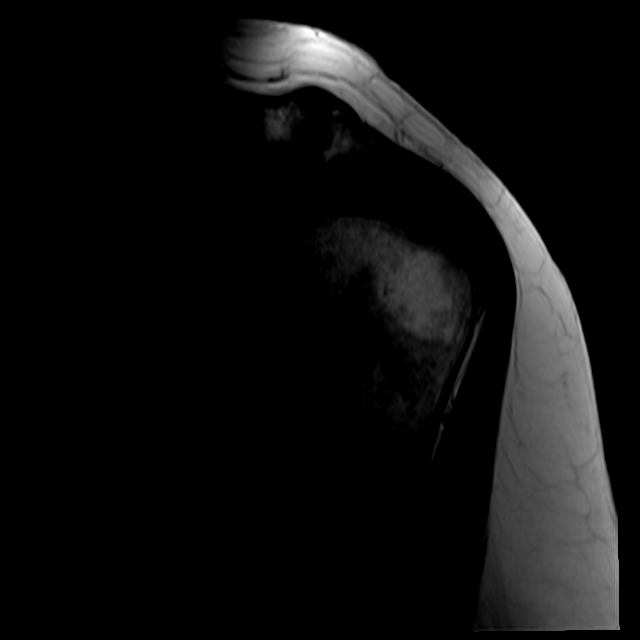
[im 14/21]
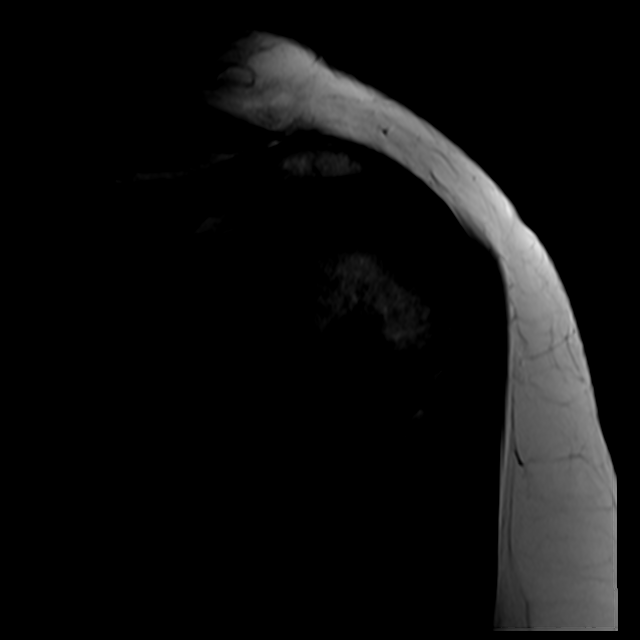
[im 17/21]
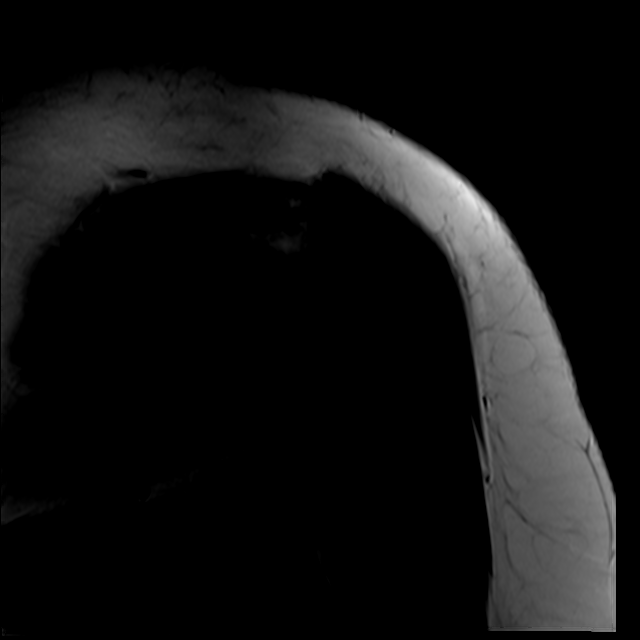
[im 21/21]
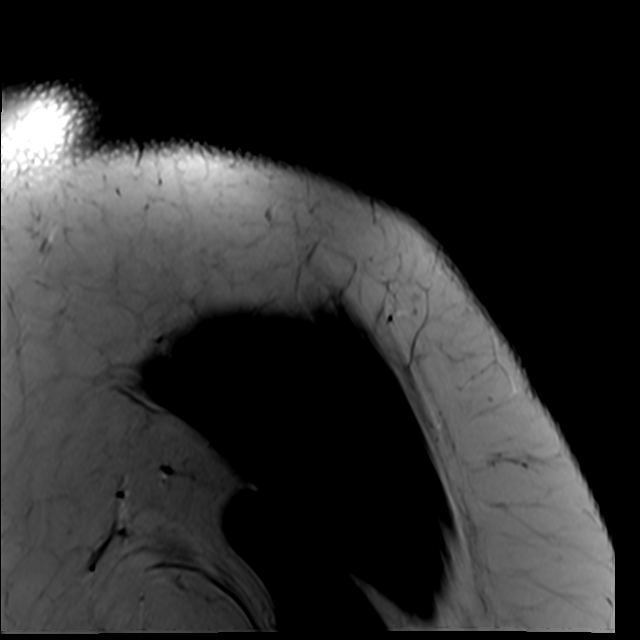

[Series 9: T2 fat-sat · oblique · left · 4.0mm · 0.44mm/px · 3 of 23 slices shown (2 of 3)]
[im 4/23]
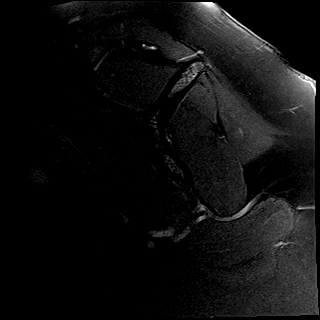
[im 13/23]
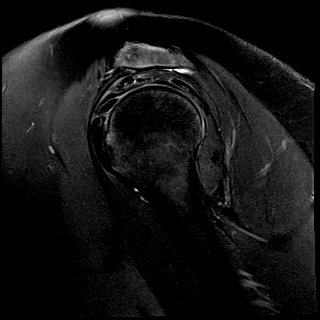
[im 19/23]
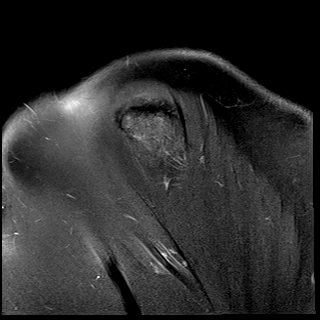

[Series 11: T2 fat-sat · axial · left · 3.0mm · 0.47mm/px · z∈[-23,+50]mm · 3 of 27 slices shown (3 of 3)]
[im 4/27]
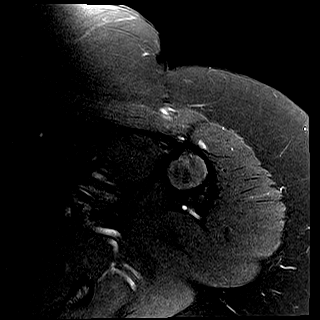
[im 14/27]
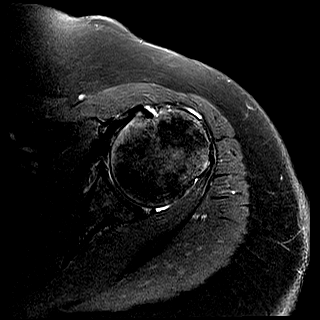
[im 23/27]
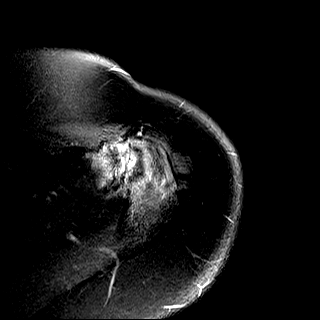

[17 of 40 positions shown; findings below may reference images not displayed]

FINDINGS: Rotator cuff: There is moderate tendinosis of the distal
supraspinatus tendon with bursal sided fraying. The infraspinatus
is intact. Teres minor tendon is intact. Subscapularis tendon is
intact.

Muscles: No muscle atrophy or edema. No intramuscular fluid
collection or hematoma.

Biceps Long Head: Intraarticular and extraarticular portions of the
biceps tendon are intact.

Acromioclavicular Joint: Moderate-severe arthropathy of the
acromioclavicular joint. Mild subacromial/subdeltoid bursal fluid.

Glenohumeral Joint: No joint effusion. No chondral defect.

Labrum: Grossly intact, but evaluation is limited by lack of
intraarticular fluid/contrast.

Bones: No fracture or dislocation. No aggressive osseous lesion.

Other: No fluid collection or hematoma.
IMPRESSION: Moderate distal supraspinatus tendinosis with bursal sided fraying
and mild subacromial-subdeltoid bursitis. No high-grade or retracted
cuff tear.

Moderate-severe AC joint arthropathy.

## 2023-02-12 DIAGNOSIS — L409 Psoriasis, unspecified: Secondary | ICD-10-CM | POA: Diagnosis not present

## 2023-02-12 DIAGNOSIS — Z79899 Other long term (current) drug therapy: Secondary | ICD-10-CM | POA: Diagnosis not present

## 2023-03-07 ENCOUNTER — Ambulatory Visit: Payer: Medicare HMO | Admitting: Nurse Practitioner

## 2023-03-07 ENCOUNTER — Ambulatory Visit (INDEPENDENT_AMBULATORY_CARE_PROVIDER_SITE_OTHER): Payer: Medicare HMO | Admitting: Family Medicine

## 2023-03-07 ENCOUNTER — Encounter: Payer: Self-pay | Admitting: Family Medicine

## 2023-03-07 VITALS — BP 146/86 | HR 81 | Temp 98.1°F | Ht 62.0 in | Wt 187.8 lb

## 2023-03-07 DIAGNOSIS — I10 Essential (primary) hypertension: Secondary | ICD-10-CM

## 2023-03-07 DIAGNOSIS — E559 Vitamin D deficiency, unspecified: Secondary | ICD-10-CM | POA: Insufficient documentation

## 2023-03-07 DIAGNOSIS — J449 Chronic obstructive pulmonary disease, unspecified: Secondary | ICD-10-CM

## 2023-03-07 DIAGNOSIS — E782 Mixed hyperlipidemia: Secondary | ICD-10-CM

## 2023-03-07 DIAGNOSIS — F419 Anxiety disorder, unspecified: Secondary | ICD-10-CM | POA: Diagnosis not present

## 2023-03-07 DIAGNOSIS — Z6834 Body mass index (BMI) 34.0-34.9, adult: Secondary | ICD-10-CM | POA: Insufficient documentation

## 2023-03-07 DIAGNOSIS — J301 Allergic rhinitis due to pollen: Secondary | ICD-10-CM | POA: Diagnosis not present

## 2023-03-07 DIAGNOSIS — R7303 Prediabetes: Secondary | ICD-10-CM

## 2023-03-07 LAB — BAYER DCA HB A1C WAIVED: HB A1C (BAYER DCA - WAIVED): 5.4 % (ref 4.8–5.6)

## 2023-03-07 MED ORDER — BREZTRI AEROSPHERE 160-9-4.8 MCG/ACT IN AERO
2.0000 | INHALATION_SPRAY | Freq: Two times a day (BID) | RESPIRATORY_TRACT | 11 refills | Status: DC
Start: 2023-03-07 — End: 2024-05-13

## 2023-03-07 MED ORDER — FLUTICASONE PROPIONATE 50 MCG/ACT NA SUSP
NASAL | 11 refills | Status: DC
Start: 2023-03-07 — End: 2023-07-27

## 2023-03-07 MED ORDER — HYDROXYZINE PAMOATE 25 MG PO CAPS
25.0000 mg | ORAL_CAPSULE | Freq: Three times a day (TID) | ORAL | 0 refills | Status: DC | PRN
Start: 2023-03-07 — End: 2023-04-02

## 2023-03-07 NOTE — Progress Notes (Signed)
Subjective:  Patient ID: Judy Evans, female    DOB: 26-Oct-1970, 52 y.o.   MRN: [id number]  Patient Care Team: Sonny Masters, FNP as PCP - General (Family Medicine) Rollene Rotunda, MD as PCP - Cardiology (Cardiology) Jena Gauss Gerrit Friends, MD as Consulting Physician (Gastroenterology) Marcelino Duster, MD as Referring Physician (Dermatology) Mele Sylvester, Doralee Albino, FNP as Nurse Practitioner (Family Medicine)   Chief Complaint:  Establish Care (Previous JE patient ), Anxiety (Ongoing but states it has gotten worse and she feels she needs medication.  Mind constantly going. Lost two sisters and now has to take care of her parents. ), and Weight Loss   HPI: Judy Evans is a 52 y.o. female presenting on 03/07/2023 for Establish Care (Previous JE patient ), Anxiety (Ongoing but states it has gotten worse and she feels she needs medication.  Mind constantly going. Lost two sisters and now has to take care of her parents. ), and Weight Loss  Pt presents today to establish care with new PCP and for management of chronic medical conditions.  She has a history of hypertension, hyperlipidemia, prediabetes, COPD, fibromyalgia, chronic pain of multiple sites, Vit D deficiency, depression, anxiety, insomnia, and psoriasis. She is compliant with her hypertension and hyperlipidemia medications without associated side effects. She has not been checking her blood pressure at home. She does not follow a diet or exercise routine. She has as needed medications for her COPD but reports she is not on a daily medication. She does see neurology and pain management for her chronic pain and fibromyalgia. She is followed by dermatology for her psoriasis. Her biggest concern today is her increased anxiety and depression along with weight gain. She has been on medications in the past for her anxiety and depression but does not wish to be on daily medications. States she has significant family stressors which are  increasing her symptoms. Denies SI or HI. States she would like to discuss weight loss options as she has not been successful with weight management in the past.     03/07/2023   12:02 PM 01/10/2023    1:55 PM 12/29/2022    9:19 AM 09/05/2022    1:47 PM 09/05/2022    1:46 PM  Depression screen PHQ 2/9  Decreased Interest 1 0 2 0   Down, Depressed, Hopeless 0 0 0 0 0  PHQ - 2 Score 1 0 2 0 0  Altered sleeping 0 0 1 0 0  Tired, decreased energy 1 0 3 0 0  Change in appetite 0 0 2 0 0  Feeling bad or failure about yourself  0 0 0 0 0  Trouble concentrating 1 0 1 0 0  Moving slowly or fidgety/restless 1 0 0 0 0  Suicidal thoughts 0 0 0 0 0  PHQ-9 Score 4 0 9 0 0  Difficult doing work/chores Somewhat difficult Not difficult at all Somewhat difficult Not difficult at all Not difficult at all      03/07/2023   12:02 PM 12/29/2022    9:19 AM 02/07/2022   11:00 AM 01/06/2022    9:54 AM  GAD 7 : Generalized Anxiety Score  Nervous, Anxious, on Edge 2 0 1 1  Control/stop worrying 1 0 0 1  Worry too much - different things 2 0 0 1  Trouble relaxing 1 0 0 0  Restless 1 0 0 0  Easily annoyed or irritable 2 1 1 1   Afraid - awful might happen 2  0  1  Total GAD 7 Score 11 1  5   Anxiety Difficulty Very difficult Not difficult at all Not difficult at all Somewhat difficult      Relevant past medical, surgical, family, and social history reviewed and updated as indicated.  Allergies and medications reviewed and updated. Data reviewed: Chart in Epic.   Past Medical History:  Diagnosis Date   Anxiety    Aphasia    Arthritis    COPD (chronic obstructive pulmonary disease) (HCC)    CRPS (complex regional pain syndrome type I)    DDD (degenerative disc disease), cervical    Degenerative disc disease at L5-S1 level    Fibromyalgia    GERD (gastroesophageal reflux disease)    IBS (irritable bowel syndrome)    Migraines    Moody 01/22/2015   Psoriasis    Scoliosis    Spondylolysis     Past  Surgical History:  Procedure Laterality Date   ABDOMINAL HYSTERECTOMY     BIOPSY  09/14/2016   Procedure: BIOPSY;  Surgeon: Corbin Ade, MD;  Location: [facility];  Service: Endoscopy;;  gastric    CARDIAC CATHETERIZATION  2015   patient reported it to be normal. "I was dying in pain during procedure".    CARPAL TUNNEL RELEASE Right    CESAREAN SECTION     CHOLECYSTECTOMY     COLONOSCOPY  06/2014   Vernia Buff DeMason: normal   DILATION AND CURETTAGE OF UTERUS     ESOPHAGOGASTRODUODENOSCOPY  07/2014   Erskine Speed: Normal   ESOPHAGOGASTRODUODENOSCOPY (EGD) WITH PROPOFOL N/A 09/14/2016   Dr. Jena Gauss: Normal esophagus status post empiric dilation, erosive gastropathy with biopsies showing chronic inactive gastritis, no H pylori.   EXTERNAL FIXATION OF FINGER Left 12/01/2021   Procedure: LEFT RING DIGIT WIDGET PLACEMENT;  Surgeon: Betha Loa, MD;  Location: [facility];  Service: Orthopedics;  Laterality: Left;   FOOT SURGERY Bilateral    removal of heel spurs   MALONEY DILATION  09/14/2016   Procedure: MALONEY DILATION;  Surgeon: Corbin Ade, MD;  Location: [facility];  Service: Endoscopy;;   rotator cuff surgery Right    TONSILLECTOMY      Social History   Socioeconomic History   Marital status: Legally Separated    Spouse name: Not on file   Number of children: 2   Years of education: GED   Highest education level: GED or equivalent  Occupational History   Occupation: Disabled  Tobacco Use   Smoking status: Every Day    Packs/day: 1.50    Years: 30.00    Additional pack years: 0.00    Total pack years: 45.00    Types: Cigarettes   Smokeless tobacco: Never  Vaping Use   Vaping Use: Former  Substance and Sexual Activity   Alcohol use: No    Comment: rarely   Drug use: Yes    Types: Marijuana   Sexual activity: Not Currently    Birth control/protection: Surgical    Comment: hyst  Other Topics Concern   Not on file  Social History Narrative    Caffeine use: Drinks coffee (2 cups per day), tea/soda- 2-3 cups per day   Right handed   Lives alone.   Lost oldest sister 07/2021 due to cardiac issues. Youngest sister died Sep 25, 2021 due to unknown causes.    Social Determinants of Health   Financial Resource Strain: Low Risk  (01/10/2023)   Overall Financial Resource Strain (CARDIA)    Difficulty of  Paying Living Expenses: Not hard at all  Food Insecurity: No Food Insecurity (01/10/2023)   Hunger Vital Sign    Worried About Running Out of Food in the Last Year: Never true    Ran Out of Food in the Last Year: Never true  Transportation Needs: No Transportation Needs (01/10/2023)   PRAPARE - Administrator, Civil Service (Medical): No    Lack of Transportation (Non-Medical): No  Physical Activity: Insufficiently Active (01/10/2023)   Exercise Vital Sign    Days of Exercise per Week: 2 days    Minutes of Exercise per Session: 30 min  Stress: No Stress Concern Present (01/10/2023)   [employer] of Occupational Health - Occupational Stress Questionnaire    Feeling of Stress : Not at all  Social Connections: Socially Isolated (01/10/2023)   Social Connection and Isolation Panel [NHANES]    Frequency of Communication with Friends and Family: More than three times a week    Frequency of Social Gatherings with Friends and Family: More than three times a week    Attends Religious Services: Never    Database administrator or Organizations: No    Attends Banker Meetings: Never    Marital Status: Separated  Intimate Partner Violence: Not At Risk (01/10/2023)   Humiliation, Afraid, Rape, and Kick questionnaire    Fear of Current or Ex-Partner: No    Emotionally Abused: No    Physically Abused: No    Sexually Abused: No    Outpatient Encounter Medications as of 03/07/2023  Medication Sig   albuterol (VENTOLIN HFA) 108 (90 Base) MCG/ACT inhaler USE 2 PUFFS BY MOUTH EVERY 4 HOURS AS NEEDED FOR WHEEZING OR SHORTNESS  OF BREATH   amitriptyline (ELAVIL) 25 MG tablet Two po qHS   Budeson-Glycopyrrol-Formoterol (BREZTRI AEROSPHERE) 160-9-4.8 MCG/ACT AERO Inhale 2 puffs into the lungs 2 (two) times daily.   celecoxib (CELEBREX) 200 MG capsule one po qd   cetirizine (ZYRTEC) 10 MG tablet Take 1 tablet (10 mg total) by mouth daily.   COSENTYX SENSOREADY, 300 MG, 150 MG/ML SOAJ    fish oil-omega-3 fatty acids 1000 MG capsule Take 2 capsules (2 g total) by mouth daily.   Fremanezumab-vfrm (AJOVY) 225 MG/1.5ML SOSY INJECT 1 SYRINGE UNDER THE SKIN EVERY 28 DAYS.   gabapentin (NEURONTIN) 800 MG tablet Take 1 tablet (800 mg total) by mouth 4 (four) times daily. (Needs to be seen before next refill)   hydrOXYzine (VISTARIL) 25 MG capsule Take 1 capsule (25 mg total) by mouth every 8 (eight) hours as needed for anxiety.   ipratropium-albuterol (DUONEB) 0.5-2.5 (3) MG/3ML SOLN INHALE 3 ML BY NEBULIZER EVERY 6 HOURS AS NEEDED   lidocaine (LIDODERM) 5 % Place 1 patch onto the skin daily. Remove & Discard patch within 12 hours or as directed by MD   nystatin ointment (MYCOSTATIN) Apply 1 application topically 2 (two) times daily as needed.   omeprazole (PRILOSEC) 40 MG capsule TAKE 1 CAPSULE BY MOUTH EVERY DAY   ondansetron (ZOFRAN-ODT) 8 MG disintegrating tablet Take 1 tablet (8 mg total) by mouth every 8 (eight) hours as needed for nausea or vomiting.   triamcinolone cream (KENALOG) 0.1 %    triamcinolone ointment (KENALOG) 0.5 % Apply topically 2 (two) times daily.   fluticasone (FLONASE) 50 MCG/ACT nasal spray Administer 2 sprays in each nostril daily.   [DISCONTINUED] fluticasone (FLONASE) 50 MCG/ACT nasal spray Administer 2 sprays in each nostril daily.   [DISCONTINUED] nitroGLYCERIN (NITROSTAT) 0.4 MG SL  tablet Place 1 tablet (0.4 mg total) under the tongue every 5 (five) minutes as needed for chest pain.   No facility-administered encounter medications on file as of 03/07/2023.    Allergies  Allergen Reactions    Rosuvastatin Calcium Swelling    Ends up in the hospital   Statins Swelling    Ends up in the hospital   Wellbutrin [Bupropion]     suicidal   Hctz [Hydrochlorothiazide] Nausea And Vomiting and Rash   Hydrocodone-Acetaminophen Rash   Oxycodone Rash   Penicillins Nausea And Vomiting and Rash    Has patient had a PCN reaction causing immediate rash, facial/tongue/throat swelling, SOB or lightheadedness with hypotension: Yes Has patient had a PCN reaction causing severe rash involving mucus membranes or skin necrosis: No Has patient had a PCN reaction that required hospitalization No Has patient had a PCN reaction occurring within the last 10 years: No If all of the above answers are "NO", then may proceed with Cephalosporin use.     Review of Systems  Constitutional:  Positive for activity change and fatigue. Negative for appetite change, chills, diaphoresis, fever and unexpected weight change.  HENT:  Positive for congestion, postnasal drip and rhinorrhea.   Eyes: Negative.   Respiratory:  Negative for cough, chest tightness and shortness of breath.   Cardiovascular:  Negative for chest pain, palpitations and leg swelling.  Gastrointestinal:  Negative for blood in stool, constipation, diarrhea, nausea and vomiting.  Endocrine: Negative.   Genitourinary:  Negative for dysuria, frequency and urgency.  Musculoskeletal:  Negative for arthralgias and myalgias.  Skin: Negative.   Allergic/Immunologic: Negative.   Neurological:  Negative for dizziness and headaches.  Hematological: Negative.   Psychiatric/Behavioral:  Positive for agitation, decreased concentration and sleep disturbance. Negative for behavioral problems, confusion, dysphoric mood, hallucinations, self-injury and suicidal ideas. The patient is nervous/anxious. The patient is not hyperactive.   All other systems reviewed and are negative.       Objective:  BP (!) 146/86   Pulse 81   Temp 98.1 F (36.7 C) (Temporal)    Ht 5\' 2"  (1.575 m)   Wt 187 lb 12.8 oz (85.2 kg)   SpO2 95%   BMI 34.35 kg/m    Wt Readings from Last 3 Encounters:  03/07/23 187 lb 12.8 oz (85.2 kg)  01/10/23 187 lb (84.8 kg)  12/29/22 188 lb (85.3 kg)    Physical Exam Vitals and nursing note reviewed.  Constitutional:      General: She is not in acute distress.    Appearance: Normal appearance. She is well-developed and well-groomed. She is obese. She is not ill-appearing, toxic-appearing or diaphoretic.  HENT:     Head: Normocephalic and atraumatic.     Jaw: There is normal jaw occlusion.     Right Ear: Hearing normal.     Left Ear: Hearing normal.     Nose: Nose normal.     Mouth/Throat:     Lips: Pink.     Mouth: Mucous membranes are moist.     Pharynx: Oropharynx is clear. Uvula midline.  Eyes:     General: Lids are normal.     Extraocular Movements: Extraocular movements intact.     Conjunctiva/sclera: Conjunctivae normal.     Pupils: Pupils are equal, round, and reactive to light.  Neck:     Thyroid: No thyroid mass, thyromegaly or thyroid tenderness.     Vascular: No carotid bruit or JVD.     Trachea: Trachea and phonation normal.  Cardiovascular:     Rate and Rhythm: Normal rate and regular rhythm.     Chest Wall: PMI is not displaced.     Pulses: Normal pulses.     Heart sounds: Normal heart sounds. No murmur heard.    No friction rub. No gallop.  Pulmonary:     Effort: Pulmonary effort is normal. No respiratory distress.     Breath sounds: No stridor. Wheezing present. No rhonchi or rales.  Chest:     Chest wall: No tenderness.  Abdominal:     General: Bowel sounds are normal. There is no distension or abdominal bruit.     Palpations: Abdomen is soft. There is no hepatomegaly or splenomegaly.     Tenderness: There is no abdominal tenderness. There is no right CVA tenderness or left CVA tenderness.     Hernia: No hernia is present.  Musculoskeletal:        General: Normal range of motion.      Cervical back: Normal range of motion and neck supple.     Right lower leg: No edema.     Left lower leg: No edema.  Lymphadenopathy:     Cervical: No cervical adenopathy.  Skin:    General: Skin is warm and dry.     Capillary Refill: Capillary refill takes less than 2 seconds.     Coloration: Skin is not cyanotic, jaundiced or pale.     Findings: No rash.  Neurological:     General: No focal deficit present.     Mental Status: She is alert and oriented to person, place, and time.     Sensory: Sensation is intact.     Motor: Motor function is intact.     Coordination: Coordination is intact.     Gait: Gait is intact.     Deep Tendon Reflexes: Reflexes are normal and symmetric.  Psychiatric:        Attention and Perception: Attention and perception normal.        Mood and Affect: Mood and affect normal.        Speech: Speech normal.        Behavior: Behavior normal. Behavior is cooperative.        Thought Content: Thought content normal.        Cognition and Memory: Cognition and memory normal.        Judgment: Judgment normal.     Results for orders placed or performed in visit on 03/07/23  Bayer DCA Hb A1c Waived  Result Value Ref Range   HB A1C (BAYER DCA - WAIVED) 5.4 4.8 - 5.6 %       Pertinent labs & imaging results that were available during my care of the patient were reviewed by me and considered in my medical decision making.  Assessment & Plan:  Shaune was seen today for establish care, anxiety and weight loss.  Diagnoses and all orders for this visit:  Primary hypertension BP well controlled. Changes were not made in regimen today. Goal BP is 130/80. Pt aware to report any persistent high or low readings. DASH diet and exercise encouraged. Exercise at least 150 minutes per week and increase as tolerated. Goal BMI > 25. Stress management encouraged. Avoid nicotine and tobacco product use. Avoid excessive alcohol and NSAID's. Avoid more than 2000 mg of sodium  daily. Medications as prescribed. Follow up as scheduled.  -     CMP14+EGFR -     CBC with Differential/Platelet -     Lipid  panel -     Thyroid Panel With TSH  Pre-diabetes A1C remains well controlled with diet and exercise. Will repeat labs today.  -     CMP14+EGFR -     CBC with Differential/Platelet -     Lipid panel -     Thyroid Panel With TSH -     Bayer DCA Hb A1c Waived  Mixed hyperlipidemia Diet encouraged - increase intake of fresh fruits and vegetables, increase intake of lean proteins. Bake, broil, or grill foods. Avoid fried, greasy, and fatty foods. Avoid fast foods. Increase intake of fiber-rich whole grains. Exercise encouraged - at least 150 minutes per week and advance as tolerated.  Goal BMI < 25. Continue medications as prescribed. Follow up in 3-6 months as discussed.  -     CMP14+EGFR -     Lipid panel  BMI 34.0-34.9,adult Diet and exercise encouraged. Labs pending. Aware to keep log of diet and exercise and bring to follow up visit.  -     CMP14+EGFR -     CBC with Differential/Platelet -     Lipid panel -     Thyroid Panel With TSH -     VITAMIN D 25 Hydroxy (Vit-D Deficiency, Fractures) -     Bayer DCA Hb A1c Waived  Seasonal allergic rhinitis due to pollen Will refill today.  -     fluticasone (FLONASE) 50 MCG/ACT nasal spray; Administer 2 sprays in each nostril daily.  Anxiety Worsening over the last several months. Does not wish to be on daily medications. Will trial below for as needed therapy. Will check labs for potential underlying causes.  -     CMP14+EGFR -     CBC with Differential/Platelet -     Thyroid Panel With TSH -     hydrOXYzine (VISTARIL) 25 MG capsule; Take 1 capsule (25 mg total) by mouth every 8 (eight) hours as needed for anxiety.  Vitamin D deficiency Labs pending. Continue repletion therapy. If indicated, will change repletion dosage. Eat foods rich in Vit D including milk, orange juice, yogurt with vitamin D added, salmon or  mackerel, canned tuna fish, cereals with vitamin D added, and cod liver oil. Get out in the sun but make sure to wear at least SPF 30 sunscreen.  -     VITAMIN D 25 Hydroxy (Vit-D Deficiency, Fractures)  COPD mixed type (HCC) Will start daily Breztri as prescribed. Aware to continue SABA as needed.  -     Budeson-Glycopyrrol-Formoterol (BREZTRI AEROSPHERE) 160-9-4.8 MCG/ACT AERO; Inhale 2 puffs into the lungs 2 (two) times daily.     Continue all other maintenance medications.  Follow up plan: Return in about 6 weeks (around 04/18/2023), or if symptoms worsen or fail to improve, for anxiety. BMI.   Continue healthy lifestyle choices, including diet (rich in fruits, vegetables, and lean proteins, and low in salt and simple carbohydrates) and exercise (at least 30 minutes of moderate physical activity daily).  Educational handout given for calorie counting for weight management  The above assessment and management plan was discussed with the patient. The patient verbalized understanding of and has agreed to the management plan. Patient is aware to call the clinic if they develop any new symptoms or if symptoms persist or worsen. Patient is aware when to return to the clinic for a follow-up visit. Patient educated on when it is appropriate to go to the emergency department.   Kari Baars, FNP-C [facility] [phone]

## 2023-03-07 NOTE — Patient Instructions (Addendum)
Here is a guide to help [place] find out which weight loss medications will be covered by your insurance plan.  Please check out this web site  [employer] and follow the instructions.   There is also a phone number you can call if you do not have access to the Internet. Call [phone] (Monday- Friday 8am-8pm) and an associate will help navigate you.   [employer]  or [employer] provides coverage information for more than 80% of the inquiries submitted!!

## 2023-03-08 LAB — CBC WITH DIFFERENTIAL/PLATELET
Basophils Absolute: 0 10*3/uL (ref 0.0–0.2)
Basos: 1 %
EOS (ABSOLUTE): 0.3 10*3/uL (ref 0.0–0.4)
Eos: 5 %
Hematocrit: 50.5 % — ABNORMAL HIGH (ref 34.0–46.6)
Hemoglobin: 16.6 g/dL — ABNORMAL HIGH (ref 11.1–15.9)
Immature Grans (Abs): 0 10*3/uL (ref 0.0–0.1)
Immature Granulocytes: 0 %
Lymphocytes Absolute: 2 10*3/uL (ref 0.7–3.1)
Lymphs: 36 %
MCH: 31.7 pg (ref 26.6–33.0)
MCHC: 32.9 g/dL (ref 31.5–35.7)
MCV: 96 fL (ref 79–97)
Monocytes Absolute: 0.3 10*3/uL (ref 0.1–0.9)
Monocytes: 4 %
Neutrophils Absolute: 3.1 10*3/uL (ref 1.4–7.0)
Neutrophils: 54 %
Platelets: 231 10*3/uL (ref 150–450)
RBC: 5.24 x10E6/uL (ref 3.77–5.28)
RDW: 12.6 % (ref 11.7–15.4)
WBC: 5.7 10*3/uL (ref 3.4–10.8)

## 2023-03-08 LAB — LIPID PANEL
Chol/HDL Ratio: 3.9 ratio (ref 0.0–4.4)
Cholesterol, Total: 235 mg/dL — ABNORMAL HIGH (ref 100–199)
HDL: 61 mg/dL (ref >39)
LDL Chol Calc (NIH): 152 mg/dL — ABNORMAL HIGH (ref 0–99)
Triglycerides: 123 mg/dL (ref 0–149)
VLDL Cholesterol Cal: 22 mg/dL (ref 5–40)

## 2023-03-08 LAB — CMP14+EGFR
ALT: 12 IU/L (ref 0–32)
AST: 13 IU/L (ref 0–40)
Albumin/Globulin Ratio: 2.2
Albumin: 4.4 g/dL (ref 3.8–4.9)
Alkaline Phosphatase: 114 IU/L (ref 44–121)
BUN/Creatinine Ratio: 9 (ref 9–23)
BUN: 6 mg/dL (ref 6–24)
Bilirubin Total: 0.4 mg/dL (ref 0.0–1.2)
CO2: 23 mmol/L (ref 20–29)
Calcium: 9.4 mg/dL (ref 8.7–10.2)
Chloride: 103 mmol/L (ref 96–106)
Creatinine, Ser: 0.68 mg/dL (ref 0.57–1.00)
Globulin, Total: 2 g/dL (ref 1.5–4.5)
Glucose: 128 mg/dL — ABNORMAL HIGH (ref 70–99)
Potassium: 4.2 mmol/L (ref 3.5–5.2)
Sodium: 142 mmol/L (ref 134–144)
Total Protein: 6.4 g/dL (ref 6.0–8.5)
eGFR: 105 mL/min/{1.73_m2} (ref >59)

## 2023-03-08 LAB — THYROID PANEL WITH TSH
Free Thyroxine Index: 2 (ref 1.2–4.9)
T3 Uptake Ratio: 25 % (ref 24–39)
T4, Total: 7.8 ug/dL (ref 4.5–12.0)
TSH: 1.06 u[IU]/mL (ref 0.450–4.500)

## 2023-03-08 LAB — VITAMIN D 25 HYDROXY (VIT D DEFICIENCY, FRACTURES): Vit D, 25-Hydroxy: 26.4 ng/mL — ABNORMAL LOW (ref 30.0–100.0)

## 2023-03-14 ENCOUNTER — Other Ambulatory Visit: Payer: Self-pay | Admitting: Family Medicine

## 2023-03-14 DIAGNOSIS — G8929 Other chronic pain: Secondary | ICD-10-CM

## 2023-03-20 ENCOUNTER — Telehealth: Payer: Self-pay

## 2023-03-20 NOTE — Telephone Encounter (Signed)
Pharmacy Patient Advocate Encounter   Received notification that prior authorization for Lidocaine 5% patches is required/requested.   PA submitted to [employer] Part D via CoverMyMeds Key  # [id number] Status is pending

## 2023-03-20 NOTE — Telephone Encounter (Signed)
PA has been DENIED;   We denied this request under Medicare Part D because: The information provided by your prescriber did not meet the requirements for covering this medication (prior authorization).  Your plan does not allow coverage of this medication based on your prescriber answering No to the following question(s): Is the requested drug being prescribed for any of the following: A) pain associated with post-herpetic neuralgia, B) pain associated with diabetic neuropathy, C) pain associated with cancer-related neuropathy (including treatment-related neuropathy [e.g., neuropathy associated with radiation treatment or  chemotherapy])?   *PATIENT MAY USE 4% OTC Lidocaine patches

## 2023-03-21 ENCOUNTER — Other Ambulatory Visit: Payer: Self-pay | Admitting: Family Medicine

## 2023-03-21 DIAGNOSIS — G8929 Other chronic pain: Secondary | ICD-10-CM

## 2023-03-21 NOTE — Telephone Encounter (Signed)
Name from pharmacy: LIDOCAINE 5% PATCH  Pharmacy comment: Alternative Requested:PRIOR AUTH DENIED; PLEASE APPEAL TO INSURANCE OR CONSIDER ALTERNATIVE.

## 2023-03-28 ENCOUNTER — Other Ambulatory Visit: Payer: Self-pay | Admitting: *Deleted

## 2023-03-28 DIAGNOSIS — K219 Gastro-esophageal reflux disease without esophagitis: Secondary | ICD-10-CM

## 2023-03-28 MED ORDER — OMEPRAZOLE 40 MG PO CPDR
DELAYED_RELEASE_CAPSULE | ORAL | 0 refills | Status: DC
Start: 2023-03-28 — End: 2023-06-19

## 2023-03-29 ENCOUNTER — Other Ambulatory Visit: Payer: Self-pay | Admitting: Family Medicine

## 2023-03-29 DIAGNOSIS — F419 Anxiety disorder, unspecified: Secondary | ICD-10-CM

## 2023-04-02 ENCOUNTER — Other Ambulatory Visit: Payer: Self-pay | Admitting: Family Medicine

## 2023-04-02 DIAGNOSIS — F419 Anxiety disorder, unspecified: Secondary | ICD-10-CM

## 2023-04-05 ENCOUNTER — Telehealth: Payer: Self-pay | Admitting: Family Medicine

## 2023-04-05 DIAGNOSIS — G8929 Other chronic pain: Secondary | ICD-10-CM

## 2023-04-05 NOTE — Telephone Encounter (Signed)
Name from pharmacy: LIDOCAINE 5% PATCH        Will file in chart as: lidocaine (LIDODERM) 5 %   Pharmacy comment: Alternative Requested:PT IS WANTING A PA DONE

## 2023-04-06 ENCOUNTER — Other Ambulatory Visit (HOSPITAL_COMMUNITY): Payer: Self-pay

## 2023-04-06 ENCOUNTER — Telehealth: Payer: Self-pay

## 2023-04-06 NOTE — Telephone Encounter (Signed)
Received a request to do a PA for this PT-previous PA was denied back in June-Tried to submit a new PA due to previous denial an appeal will need to be done or medication changed. Please refer to previous telephone note where PA was denied. The denial letter is in the chart under the media tab.

## 2023-04-18 ENCOUNTER — Ambulatory Visit: Payer: Medicare HMO | Admitting: Family Medicine

## 2023-04-18 ENCOUNTER — Other Ambulatory Visit: Payer: Self-pay | Admitting: Family Medicine

## 2023-04-18 ENCOUNTER — Encounter: Payer: Self-pay | Admitting: Family Medicine

## 2023-04-18 VITALS — BP 127/86 | HR 80 | Ht 62.0 in | Wt 190.0 lb

## 2023-04-18 DIAGNOSIS — G629 Polyneuropathy, unspecified: Secondary | ICD-10-CM

## 2023-04-18 DIAGNOSIS — Z6841 Body Mass Index (BMI) 40.0 and over, adult: Secondary | ICD-10-CM

## 2023-04-18 DIAGNOSIS — R7303 Prediabetes: Secondary | ICD-10-CM

## 2023-04-18 DIAGNOSIS — I10 Essential (primary) hypertension: Secondary | ICD-10-CM

## 2023-04-18 DIAGNOSIS — F419 Anxiety disorder, unspecified: Secondary | ICD-10-CM | POA: Diagnosis not present

## 2023-04-18 DIAGNOSIS — E782 Mixed hyperlipidemia: Secondary | ICD-10-CM

## 2023-04-18 MED ORDER — SEMAGLUTIDE-WEIGHT MANAGEMENT 1.7 MG/0.75ML ~~LOC~~ SOAJ
1.7000 mg | SUBCUTANEOUS | 0 refills | Status: AC
Start: 2023-07-14 — End: 2023-08-11

## 2023-04-18 MED ORDER — HYDROXYZINE PAMOATE 25 MG PO CAPS
50.0000 mg | ORAL_CAPSULE | Freq: Three times a day (TID) | ORAL | 0 refills | Status: DC | PRN
Start: 2023-04-18 — End: 2023-05-22

## 2023-04-18 MED ORDER — SEMAGLUTIDE-WEIGHT MANAGEMENT 0.25 MG/0.5ML ~~LOC~~ SOAJ
0.2500 mg | SUBCUTANEOUS | 0 refills | Status: AC
Start: 2023-04-18 — End: 2023-05-16

## 2023-04-18 MED ORDER — SEMAGLUTIDE-WEIGHT MANAGEMENT 1 MG/0.5ML ~~LOC~~ SOAJ: 1.0000 mg | SUBCUTANEOUS | 0 refills | Status: AC

## 2023-04-18 MED ORDER — SEMAGLUTIDE-WEIGHT MANAGEMENT 2.4 MG/0.75ML ~~LOC~~ SOAJ
2.4000 mg | SUBCUTANEOUS | 0 refills | Status: AC
Start: 2023-08-12 — End: 2023-09-09

## 2023-04-18 MED ORDER — LIDOCAINE 5 % EX PTCH
1.0000 | MEDICATED_PATCH | CUTANEOUS | 5 refills | Status: DC
Start: 2023-04-18 — End: 2023-12-27

## 2023-04-18 MED ORDER — SEMAGLUTIDE-WEIGHT MANAGEMENT 0.5 MG/0.5ML ~~LOC~~ SOAJ
0.5000 mg | SUBCUTANEOUS | 0 refills | Status: AC
Start: 2023-05-17 — End: 2023-06-14

## 2023-04-18 NOTE — Telephone Encounter (Signed)
Please let pt know she can contact her insurance to see if this will be covered.

## 2023-04-18 NOTE — Patient Instructions (Signed)
  Tips for success with GLP-1 Mediations (and by success, how not to be super sick on your stomach): Eat small meals AVOID heavy foods (fried/ high in carbs like bread, pasta, rice) AVOID carbonated beverages (soda/ beer, as these can increase bloating) DOUBLE your water intake (will help you avoid constipation/ dehydration)   GLP-1 Medications CAN cause: Nausea Abdominal pain Increased acid reflux (sometimes presents as "sour burps") Constipation OR Diarrhea Fatigue (especially when you first start it)

## 2023-04-18 NOTE — Progress Notes (Signed)
Subjective:  Patient ID: Judy Evans, female    DOB: Mar 28, 1971, 52 y.o.   MRN: [id number]  Patient Care Team: Sonny Masters, FNP as PCP - General (Family Medicine) Rollene Rotunda, MD as PCP - Cardiology (Cardiology) Jena Gauss Gerrit Friends, MD as Consulting Physician (Gastroenterology) Marcelino Duster, MD as Referring Physician (Dermatology) Damarian Priola, Doralee Albino, FNP as Nurse Practitioner (Family Medicine)   Chief Complaint:  Medical Management of Chronic Issues and Anxiety   HPI: Judy Evans is a 52 y.o. female presenting on 04/18/2023 for Medical Management of Chronic Issues and Anxiety    1. Anxiety Has been taking the as needed vistaril with some relief of symptoms, states she still has some anxiety and depression.     04/18/2023   11:14 AM 03/07/2023   12:02 PM 01/10/2023    1:55 PM 12/29/2022    9:19 AM 09/05/2022    1:47 PM  Depression screen PHQ 2/9  Decreased Interest 1 1 0 2 0  Down, Depressed, Hopeless 0 0 0 0 0  PHQ - 2 Score 1 1 0 2 0  Altered sleeping 0 0 0 1 0  Tired, decreased energy 1 1 0 3 0  Change in appetite 0 0 0 2 0  Feeling bad or failure about yourself  0 0 0 0 0  Trouble concentrating 1 1 0 1 0  Moving slowly or fidgety/restless 1 1 0 0 0  Suicidal thoughts 0 0 0 0 0  PHQ-9 Score 4 4 0 9 0  Difficult doing work/chores Somewhat difficult Somewhat difficult Not difficult at all Somewhat difficult Not difficult at all      04/18/2023   11:15 AM 03/07/2023   12:02 PM 12/29/2022    9:19 AM 02/07/2022   11:00 AM  GAD 7 : Generalized Anxiety Score  Nervous, Anxious, on Edge 2 2 0 1  Control/stop worrying 1 1 0 0  Worry too much - different things 2 2 0 0  Trouble relaxing 1 1 0 0  Restless 1 1 0 0  Easily annoyed or irritable 2 2 1 1   Afraid - awful might happen 2 2 0   Total GAD 7 Score 11 11 1    Anxiety Difficulty Very difficult Very difficult Not difficult at all Not difficult at all    2. Polyneuropathy Has been using lidoderm patches  with great relief of symptoms needs a refill today.   3. BMI 40.0-44.9, adult (HCC) Has been dieting and exercising without loss of weight. Has diet log today which reveals a healthy diet with high protein and low carbs documented. She has been following this for over 2 months.   4. Mixed hyperlipidemia Has changed diet significantly since last visit.   5. Primary hypertension Diet controlled. Does limit salt intake, no headaches, chest pain, leg swelling, weakness, or confusion.   6. Pre-diabetes Has mad significant dietary changes since last visit. No polyuria, polyphagia, or polydipsia.      Relevant past medical, surgical, family, and social history reviewed and updated as indicated.  Allergies and medications reviewed and updated. Data reviewed: Chart in Epic.   Past Medical History:  Diagnosis Date   Anxiety    Aphasia    Arthritis    COPD (chronic obstructive pulmonary disease) (HCC)    CRPS (complex regional pain syndrome type I)    DDD (degenerative disc disease), cervical    Degenerative disc disease at L5-S1 level    Fibromyalgia  GERD (gastroesophageal reflux disease)    IBS (irritable bowel syndrome)    Migraines    Moody 01/22/2015   Psoriasis    Scoliosis    Spondylolysis     Past Surgical History:  Procedure Laterality Date   ABDOMINAL HYSTERECTOMY     BIOPSY  09/14/2016   Procedure: BIOPSY;  Surgeon: Corbin Ade, MD;  Location: [facility];  Service: Endoscopy;;  gastric    CARDIAC CATHETERIZATION  2015   patient reported it to be normal. "I was dying in pain during procedure".    CARPAL TUNNEL RELEASE Right    CESAREAN SECTION     CHOLECYSTECTOMY     COLONOSCOPY  06/2014   Vernia Buff DeMason: normal   DILATION AND CURETTAGE OF UTERUS     ESOPHAGOGASTRODUODENOSCOPY  07/2014   Erskine Speed: Normal   ESOPHAGOGASTRODUODENOSCOPY (EGD) WITH PROPOFOL N/A 09/14/2016   Dr. Jena Gauss: Normal esophagus status post empiric dilation, erosive gastropathy with  biopsies showing chronic inactive gastritis, no H pylori.   EXTERNAL FIXATION OF FINGER Left 12/01/2021   Procedure: LEFT RING DIGIT WIDGET PLACEMENT;  Surgeon: Betha Loa, MD;  Location: [facility];  Service: Orthopedics;  Laterality: Left;   FOOT SURGERY Bilateral    removal of heel spurs   MALONEY DILATION  09/14/2016   Procedure: MALONEY DILATION;  Surgeon: Corbin Ade, MD;  Location: [facility];  Service: Endoscopy;;   rotator cuff surgery Right    TONSILLECTOMY      Social History   Socioeconomic History   Marital status: Legally Separated    Spouse name: Not on file   Number of children: 2   Years of education: GED   Highest education level: GED or equivalent  Occupational History   Occupation: Disabled  Tobacco Use   Smoking status: Every Day    Current packs/day: 1.50    Average packs/day: 1.5 packs/day for 30.0 years (45.0 ttl pk-yrs)    Types: Cigarettes   Smokeless tobacco: Never  Vaping Use   Vaping status: Former  Substance and Sexual Activity   Alcohol use: No    Comment: rarely   Drug use: Yes    Types: Marijuana   Sexual activity: Not Currently    Birth control/protection: Surgical    Comment: hyst  Other Topics Concern   Not on file  Social History Narrative   Caffeine use: Drinks coffee (2 cups per day), tea/soda- 2-3 cups per day   Right handed   Lives alone.   Lost oldest sister 07/2021 due to cardiac issues. Youngest sister died September 02, 2021 due to unknown causes.    Social Determinants of Health   Financial Resource Strain: Low Risk  (01/10/2023)   Overall Financial Resource Strain (CARDIA)    Difficulty of Paying Living Expenses: Not hard at all  Food Insecurity: No Food Insecurity (01/10/2023)   Hunger Vital Sign    Worried About Running Out of Food in the Last Year: Never true    Ran Out of Food in the Last Year: Never true  Transportation Needs: No Transportation Needs (01/10/2023)   PRAPARE - Scientist, research (physical sciences) (Medical): No    Lack of Transportation (Non-Medical): No  Physical Activity: Insufficiently Active (01/10/2023)   Exercise Vital Sign    Days of Exercise per Week: 2 days    Minutes of Exercise per Session: 30 min  Stress: No Stress Concern Present (01/10/2023)   [employer] of Occupational Health - Occupational Stress Questionnaire  Feeling of Stress : Not at all  Social Connections: Socially Isolated (01/10/2023)   Social Connection and Isolation Panel [NHANES]    Frequency of Communication with Friends and Family: More than three times a week    Frequency of Social Gatherings with Friends and Family: More than three times a week    Attends Religious Services: Never    Database administrator or Organizations: No    Attends Banker Meetings: Never    Marital Status: Separated  Intimate Partner Violence: Not At Risk (01/10/2023)   Humiliation, Afraid, Rape, and Kick questionnaire    Fear of Current or Ex-Partner: No    Emotionally Abused: No    Physically Abused: No    Sexually Abused: No    Outpatient Encounter Medications as of 04/18/2023  Medication Sig   albuterol (VENTOLIN HFA) 108 (90 Base) MCG/ACT inhaler USE 2 PUFFS BY MOUTH EVERY 4 HOURS AS NEEDED FOR WHEEZING OR SHORTNESS OF BREATH   amitriptyline (ELAVIL) 25 MG tablet Two po qHS   Budeson-Glycopyrrol-Formoterol (BREZTRI AEROSPHERE) 160-9-4.8 MCG/ACT AERO Inhale 2 puffs into the lungs 2 (two) times daily.   celecoxib (CELEBREX) 200 MG capsule one po qd   cetirizine (ZYRTEC) 10 MG tablet Take 1 tablet (10 mg total) by mouth daily.   COSENTYX SENSOREADY, 300 MG, 150 MG/ML SOAJ    fish oil-omega-3 fatty acids 1000 MG capsule Take 2 capsules (2 g total) by mouth daily.   fluticasone (FLONASE) 50 MCG/ACT nasal spray Administer 2 sprays in each nostril daily.   Fremanezumab-vfrm (AJOVY) 225 MG/1.5ML SOSY INJECT 1 SYRINGE UNDER THE SKIN EVERY 28 DAYS.   gabapentin (NEURONTIN) 800 MG tablet Take 1  tablet (800 mg total) by mouth 4 (four) times daily. (Needs to be seen before next refill)   ipratropium-albuterol (DUONEB) 0.5-2.5 (3) MG/3ML SOLN INHALE 3 ML BY NEBULIZER EVERY 6 HOURS AS NEEDED   nystatin ointment (MYCOSTATIN) Apply 1 application topically 2 (two) times daily as needed.   omeprazole (PRILOSEC) 40 MG capsule TAKE 1 CAPSULE BY MOUTH EVERY DAY   ondansetron (ZOFRAN-ODT) 8 MG disintegrating tablet Take 1 tablet (8 mg total) by mouth every 8 (eight) hours as needed for nausea or vomiting.   Semaglutide-Weight Management 0.25 MG/0.5ML SOAJ Inject 0.25 mg into the skin once a week for 28 days.   [START ON 05/17/2023] Semaglutide-Weight Management 0.5 MG/0.5ML SOAJ Inject 0.5 mg into the skin once a week for 28 days.   [START ON 06/15/2023] Semaglutide-Weight Management 1 MG/0.5ML SOAJ Inject 1 mg into the skin once a week for 28 days.   [START ON 07/14/2023] Semaglutide-Weight Management 1.7 MG/0.75ML SOAJ Inject 1.7 mg into the skin once a week for 28 days.   [START ON 08/12/2023] Semaglutide-Weight Management 2.4 MG/0.75ML SOAJ Inject 2.4 mg into the skin once a week for 28 days.   triamcinolone cream (KENALOG) 0.1 %    triamcinolone ointment (KENALOG) 0.5 % Apply topically 2 (two) times daily.   [DISCONTINUED] hydrOXYzine (VISTARIL) 25 MG capsule TAKE 1 CAPSULE (25 MG TOTAL) BY MOUTH EVERY 8 (EIGHT) HOURS AS NEEDED FOR ANXIETY.   [DISCONTINUED] lidocaine (LIDODERM) 5 % PLACE 1 PATCH ONTO THE SKIN DAILY. REMOVE & DISCARD PATCH WITHIN 12 HOURS OR AS DIRECTED BY MD   hydrOXYzine (VISTARIL) 25 MG capsule Take 2 capsules (50 mg total) by mouth every 8 (eight) hours as needed for anxiety.   lidocaine (LIDODERM) 5 % Place 1 patch onto the skin daily. Remove & Discard patch within 12  hours or as directed by MD   No facility-administered encounter medications on file as of 04/18/2023.    Allergies  Allergen Reactions   Rosuvastatin Calcium Swelling    Ends up in the hospital   Statins  Swelling    Ends up in the hospital   Wellbutrin [Bupropion]     suicidal   Hctz [Hydrochlorothiazide] Nausea And Vomiting and Rash   Hydrocodone-Acetaminophen Rash   Oxycodone Rash   Penicillins Nausea And Vomiting and Rash    Has patient had a PCN reaction causing immediate rash, facial/tongue/throat swelling, SOB or lightheadedness with hypotension: Yes Has patient had a PCN reaction causing severe rash involving mucus membranes or skin necrosis: No Has patient had a PCN reaction that required hospitalization No Has patient had a PCN reaction occurring within the last 10 years: No If all of the above answers are "NO", then may proceed with Cephalosporin use.     Review of Systems  Constitutional:  Positive for activity change and appetite change. Negative for chills, diaphoresis, fatigue, fever and unexpected weight change.  HENT: Negative.    Eyes: Negative.  Negative for photophobia and visual disturbance.  Respiratory:  Negative for cough, chest tightness and shortness of breath.   Cardiovascular:  Negative for chest pain, palpitations and leg swelling.  Gastrointestinal:  Negative for abdominal pain, blood in stool, constipation, diarrhea, nausea and vomiting.  Endocrine: Negative.   Genitourinary:  Negative for decreased urine volume, difficulty urinating, dysuria, frequency and urgency.  Musculoskeletal:  Negative for arthralgias and myalgias.  Skin: Negative.   Allergic/Immunologic: Negative.   Neurological:  Negative for dizziness, tremors, seizures, syncope, facial asymmetry, speech difficulty, weakness, light-headedness, numbness and headaches.  Hematological: Negative.   Psychiatric/Behavioral:  Positive for agitation, decreased concentration and sleep disturbance. Negative for behavioral problems, confusion, dysphoric mood, hallucinations, self-injury and suicidal ideas. The patient is nervous/anxious. The patient is not hyperactive.   All other systems reviewed and are  negative.       Objective:  BP 127/86   Pulse 80   Ht 5\' 2"  (1.575 m)   Wt 190 lb (86.2 kg)   BMI 34.75 kg/m    Wt Readings from Last 3 Encounters:  04/18/23 190 lb (86.2 kg)  03/07/23 187 lb 12.8 oz (85.2 kg)  01/10/23 187 lb (84.8 kg)    Physical Exam Vitals and nursing note reviewed.  Constitutional:      General: She is not in acute distress.    Appearance: Normal appearance. She is well-developed and well-groomed. She is morbidly obese. She is not ill-appearing, toxic-appearing or diaphoretic.  HENT:     Head: Normocephalic and atraumatic.     Jaw: There is normal jaw occlusion.     Right Ear: Hearing normal.     Left Ear: Hearing normal.     Nose: Nose normal.     Mouth/Throat:     Lips: Pink.     Mouth: Mucous membranes are moist.     Pharynx: Oropharynx is clear. Uvula midline.  Eyes:     General: Lids are normal.     Extraocular Movements: Extraocular movements intact.     Conjunctiva/sclera: Conjunctivae normal.     Pupils: Pupils are equal, round, and reactive to light.  Neck:     Thyroid: No thyroid mass, thyromegaly or thyroid tenderness.     Vascular: No carotid bruit or JVD.     Trachea: Trachea and phonation normal.  Cardiovascular:     Rate and Rhythm: Normal rate  and regular rhythm.     Chest Wall: PMI is not displaced.     Pulses: Normal pulses.     Heart sounds: Normal heart sounds. No murmur heard.    No friction rub. No gallop.  Pulmonary:     Effort: Pulmonary effort is normal. No respiratory distress.     Breath sounds: Normal breath sounds. No wheezing.  Abdominal:     General: Bowel sounds are normal. There is no distension or abdominal bruit.     Palpations: Abdomen is soft. There is no hepatomegaly or splenomegaly.     Tenderness: There is no abdominal tenderness. There is no right CVA tenderness or left CVA tenderness.     Hernia: No hernia is present.  Musculoskeletal:        General: Normal range of motion.     Cervical back:  Normal range of motion and neck supple.     Right lower leg: No edema.     Left lower leg: No edema.  Lymphadenopathy:     Cervical: No cervical adenopathy.  Skin:    General: Skin is warm and dry.     Capillary Refill: Capillary refill takes less than 2 seconds.     Coloration: Skin is not cyanotic, jaundiced or pale.     Findings: No rash.  Neurological:     General: No focal deficit present.     Mental Status: She is alert and oriented to person, place, and time.     Sensory: Sensation is intact.     Motor: Motor function is intact.     Coordination: Coordination is intact.     Gait: Gait is intact.     Deep Tendon Reflexes: Reflexes are normal and symmetric.  Psychiatric:        Attention and Perception: Attention and perception normal.        Mood and Affect: Mood and affect normal.        Speech: Speech normal.        Behavior: Behavior normal. Behavior is cooperative.        Thought Content: Thought content normal.        Cognition and Memory: Cognition and memory normal.        Judgment: Judgment normal.     Results for orders placed or performed in visit on 03/07/23  CMP14+EGFR  Result Value Ref Range   Glucose 128 (H) 70 - 99 mg/dL   BUN 6 6 - 24 mg/dL   Creatinine, Ser 1.61 0.57 - 1.00 mg/dL   eGFR 096 >04 VW/UJW/1.19   BUN/Creatinine Ratio 9 9 - 23   Sodium 142 134 - 144 mmol/L   Potassium 4.2 3.5 - 5.2 mmol/L   Chloride 103 96 - 106 mmol/L   CO2 23 20 - 29 mmol/L   Calcium 9.4 8.7 - 10.2 mg/dL   Total Protein 6.4 6.0 - 8.5 g/dL   Albumin 4.4 3.8 - 4.9 g/dL   Globulin, Total 2.0 1.5 - 4.5 g/dL   Albumin/Globulin Ratio 2.2    Bilirubin Total 0.4 0.0 - 1.2 mg/dL   Alkaline Phosphatase 114 44 - 121 IU/L   AST 13 0 - 40 IU/L   ALT 12 0 - 32 IU/L  CBC with Differential/Platelet  Result Value Ref Range   WBC 5.7 3.4 - 10.8 x10E3/uL   RBC 5.24 3.77 - 5.28 x10E6/uL   Hemoglobin 16.6 (H) 11.1 - 15.9 g/dL   Hematocrit 14.7 (H) 82.9 - 46.6 %   MCV 96  79 - 97  fL   MCH 31.7 26.6 - 33.0 pg   MCHC 32.9 31.5 - 35.7 g/dL   RDW 16.1 09.6 - 04.5 %   Platelets 231 150 - 450 x10E3/uL   Neutrophils 54 Not Estab. %   Lymphs 36 Not Estab. %   Monocytes 4 Not Estab. %   Eos 5 Not Estab. %   Basos 1 Not Estab. %   Neutrophils Absolute 3.1 1.4 - 7.0 x10E3/uL   Lymphocytes Absolute 2.0 0.7 - 3.1 x10E3/uL   Monocytes Absolute 0.3 0.1 - 0.9 x10E3/uL   EOS (ABSOLUTE) 0.3 0.0 - 0.4 x10E3/uL   Basophils Absolute 0.0 0.0 - 0.2 x10E3/uL   Immature Granulocytes 0 Not Estab. %   Immature Grans (Abs) 0.0 0.0 - 0.1 x10E3/uL  Lipid panel  Result Value Ref Range   Cholesterol, Total 235 (H) 100 - 199 mg/dL   Triglycerides 409 0 - 149 mg/dL   HDL 61 >81 mg/dL   VLDL Cholesterol Cal 22 5 - 40 mg/dL   LDL Chol Calc (NIH) 191 (H) 0 - 99 mg/dL   Chol/HDL Ratio 3.9 0.0 - 4.4 ratio  Thyroid Panel With TSH  Result Value Ref Range   TSH 1.060 0.450 - 4.500 uIU/mL   T4, Total 7.8 4.5 - 12.0 ug/dL   T3 Uptake Ratio 25 24 - 39 %   Free Thyroxine Index 2.0 1.2 - 4.9  VITAMIN D 25 Hydroxy (Vit-D Deficiency, Fractures)  Result Value Ref Range   Vit D, 25-Hydroxy 26.4 (L) 30.0 - 100.0 ng/mL  Bayer DCA Hb A1c Waived  Result Value Ref Range   HB A1C (BAYER DCA - WAIVED) 5.4 4.8 - 5.6 %       Pertinent labs & imaging results that were available during my care of the patient were reviewed by me and considered in my medical decision making.  Assessment & Plan:  Alayiah was seen today for medical management of chronic issues and anxiety.  Diagnoses and all orders for this visit:  Anxiety Feels medication is working well but is not completely resolving symptoms.  -     hydrOXYzine (VISTARIL) 25 MG capsule; Take 2 capsules (50 mg total) by mouth every 8 (eight) hours as needed for anxiety.  Polyneuropathy Does well with below, will continue.  -     lidocaine (LIDODERM) 5 %; Place 1 patch onto the skin daily. Remove & Discard patch within 12 hours or as directed by  MD  BMI 40.0-44.9, adult (HCC) Mixed hyperlipidemia Primary hypertension Pre-diabetes Morbid obesity with significant cardiovascular risks. Will initiate GLP-1 therapy for weight management and CV risk reduction measures. Follow up 6 weeks after starting medications. No personal or family history of MTC or pancreatitis.  -     Semaglutide-Weight Management 0.25 MG/0.5ML SOAJ; Inject 0.25 mg into the skin once a week for 28 days. -     Semaglutide-Weight Management 0.5 MG/0.5ML SOAJ; Inject 0.5 mg into the skin once a week for 28 days. -     Semaglutide-Weight Management 1 MG/0.5ML SOAJ; Inject 1 mg into the skin once a week for 28 days. -     Semaglutide-Weight Management 1.7 MG/0.75ML SOAJ; Inject 1.7 mg into the skin once a week for 28 days. -     Semaglutide-Weight Management 2.4 MG/0.75ML SOAJ; Inject 2.4 mg into the skin once a week for 28 days.     Continue all other maintenance medications.  Follow up plan: Return in about 5 months (around  09/18/2023), or if symptoms worsen or fail to improve, for chronic follow up / 6 weeks for BMI.   Continue healthy lifestyle choices, including diet (rich in fruits, vegetables, and lean proteins, and low in salt and simple carbohydrates) and exercise (at least 30 minutes of moderate physical activity daily).  Educational handout given for GLP-1 success   The above assessment and management plan was discussed with the patient. The patient verbalized understanding of and has agreed to the management plan. Patient is aware to call the clinic if they develop any new symptoms or if symptoms persist or worsen. Patient is aware when to return to the clinic for a follow-up visit. Patient educated on when it is appropriate to go to the emergency department.   Kari Baars, FNP-C [facility] [phone]

## 2023-04-18 NOTE — Telephone Encounter (Signed)
WEGOVY 0.25 MG/0.5ML SOAJ  Pharmacy comment: Alternative Requested:NON FORMULARY; PLEASE CONTACT INSURANCE OR CONSIDER ALTERNATIVE.

## 2023-04-19 ENCOUNTER — Telehealth: Payer: Self-pay

## 2023-04-19 NOTE — Telephone Encounter (Signed)
Pharmacy Patient Advocate Encounter  Received notification from [facility] that Prior Authorization for Lidocaine 5% patches has been CANCELLED due to; A previous PA request was denied. Appeal must be submitted. You can contact the plan at [phone] or fax in request to [phone]. Marland Kitchen

## 2023-04-19 NOTE — Telephone Encounter (Signed)
Ellese Ludemann (Key: [id number]) Rx #: [id number] Need Help? Call us at [phone] Outcome Additional Information Required [facility] was not able to process the request because the previous Prior Authorization Request was Denied, please submit an electronic Appeal Request. You can also contact the plan at [phone] or fax in request to [phone]. Drug Lidocaine 5% patches ePA cloud Insurance account manager Electronic PA Form [phone] NCPDP) Original Claim Info [phone] PRECERT REQ BY MD [phone] REQUIRED MD CALL [phone] REQUIRES PRIOR AUTHORIZATION

## 2023-04-20 ENCOUNTER — Other Ambulatory Visit (HOSPITAL_COMMUNITY): Payer: Self-pay

## 2023-04-20 ENCOUNTER — Other Ambulatory Visit: Payer: Self-pay | Admitting: Family Medicine

## 2023-04-20 DIAGNOSIS — R7303 Prediabetes: Secondary | ICD-10-CM

## 2023-04-20 DIAGNOSIS — E782 Mixed hyperlipidemia: Secondary | ICD-10-CM

## 2023-04-20 DIAGNOSIS — I10 Essential (primary) hypertension: Secondary | ICD-10-CM

## 2023-04-20 DIAGNOSIS — Z6841 Body Mass Index (BMI) 40.0 and over, adult: Secondary | ICD-10-CM

## 2023-04-20 NOTE — Telephone Encounter (Signed)
Insurance will not allow another PA due to a previous request being denied. Appeal will have to be submitted through phone at [phone] or fax in request to [phone]. E-Appeal is also available with Key: [id number]

## 2023-04-20 NOTE — Telephone Encounter (Signed)
In my experience, an appeal would only be accepted if a patient has neuropathic pain from shingles.  I would encourage patient to buy the salonpas/lidocaine 4% patches OTC (RX lidocaine patches are 5% so not a big difference).  [employer] has 60 ct patches for $11

## 2023-04-20 NOTE — Telephone Encounter (Signed)
Pt has medicare, medication is not covered under part D law unless pt has documented established cardiovascular disease as evidenced by ONE of the following: [1] Prior myocardial infarction (MI), [2] Prior stroke (in other words, ischemic or hemorrhagic stroke), [3] Peripheral arterial disease (in other words, intermittent claudication with ankle-brachial index less than 0.85, peripheral arterial revascularization procedure, or amputation due to atherosclerotic disease), does pt fit this criteria?

## 2023-04-20 NOTE — Telephone Encounter (Signed)
Patient aware and verbalized understanding. °

## 2023-04-20 NOTE — Telephone Encounter (Signed)
  I would encourage patient to buy the salonpas/lidocaine 4% patches OTC (RX lidocaine patches are 5% so not a big difference).  [employer] has 60 ct patches for $11

## 2023-04-23 NOTE — Telephone Encounter (Signed)
WEGOVY 0.25 MG/0.5ML SOAJ   Pharmacy comment: Alternative Requested:MEDICATION NOT ON FORMULARY. PLEASE CONTACT INSURNCE FOR ALTERNATIVE.

## 2023-04-23 NOTE — Telephone Encounter (Signed)
Please let pt know [name] is not covered by her insurance.

## 2023-04-24 NOTE — Telephone Encounter (Signed)
Patient aware.

## 2023-05-07 ENCOUNTER — Other Ambulatory Visit: Payer: Self-pay | Admitting: Family Medicine

## 2023-05-07 DIAGNOSIS — I10 Essential (primary) hypertension: Secondary | ICD-10-CM

## 2023-05-07 DIAGNOSIS — R7303 Prediabetes: Secondary | ICD-10-CM

## 2023-05-07 DIAGNOSIS — E782 Mixed hyperlipidemia: Secondary | ICD-10-CM

## 2023-05-07 DIAGNOSIS — Z6841 Body Mass Index (BMI) 40.0 and over, adult: Secondary | ICD-10-CM

## 2023-05-07 NOTE — Telephone Encounter (Signed)
WEGOVY 0.25 MG/0.5ML SOAJ WEGOVY 0.25 MG/0.5ML SOAJ  Pharmacy comment: Alternative Requested:DRUG NOT COVERED; PLEASE CONTACT INSURANCE TO TRY TO GET APPROVED OR CONSIDER ALTERNATIVE.

## 2023-05-08 ENCOUNTER — Encounter: Payer: Self-pay | Admitting: *Deleted

## 2023-05-21 ENCOUNTER — Other Ambulatory Visit: Payer: Self-pay | Admitting: Family Medicine

## 2023-05-21 DIAGNOSIS — F419 Anxiety disorder, unspecified: Secondary | ICD-10-CM

## 2023-05-30 ENCOUNTER — Encounter: Payer: Self-pay | Admitting: Family Medicine

## 2023-05-30 ENCOUNTER — Other Ambulatory Visit: Payer: Self-pay | Admitting: Emergency Medicine

## 2023-05-30 DIAGNOSIS — F17210 Nicotine dependence, cigarettes, uncomplicated: Secondary | ICD-10-CM

## 2023-05-30 DIAGNOSIS — Z0000 Encounter for general adult medical examination without abnormal findings: Secondary | ICD-10-CM

## 2023-05-30 DIAGNOSIS — Z87891 Personal history of nicotine dependence: Secondary | ICD-10-CM

## 2023-05-30 DIAGNOSIS — Z122 Encounter for screening for malignant neoplasm of respiratory organs: Secondary | ICD-10-CM

## 2023-06-05 DIAGNOSIS — L570 Actinic keratosis: Secondary | ICD-10-CM | POA: Diagnosis not present

## 2023-06-05 DIAGNOSIS — L409 Psoriasis, unspecified: Secondary | ICD-10-CM | POA: Diagnosis not present

## 2023-06-05 DIAGNOSIS — Z79899 Other long term (current) drug therapy: Secondary | ICD-10-CM | POA: Diagnosis not present

## 2023-06-18 ENCOUNTER — Encounter: Payer: Self-pay | Admitting: Neurology

## 2023-06-18 ENCOUNTER — Ambulatory Visit (INDEPENDENT_AMBULATORY_CARE_PROVIDER_SITE_OTHER): Payer: Medicare HMO | Admitting: Neurology

## 2023-06-18 ENCOUNTER — Encounter: Payer: Self-pay | Admitting: Acute Care

## 2023-06-18 ENCOUNTER — Telehealth: Payer: Self-pay | Admitting: Neurology

## 2023-06-18 VITALS — BP 96/66 | HR 92 | Ht 62.0 in | Wt 195.0 lb

## 2023-06-18 DIAGNOSIS — Z8673 Personal history of transient ischemic attack (TIA), and cerebral infarction without residual deficits: Secondary | ICD-10-CM | POA: Diagnosis not present

## 2023-06-18 DIAGNOSIS — G43709 Chronic migraine without aura, not intractable, without status migrainosus: Secondary | ICD-10-CM

## 2023-06-18 DIAGNOSIS — M542 Cervicalgia: Secondary | ICD-10-CM

## 2023-06-18 DIAGNOSIS — R200 Anesthesia of skin: Secondary | ICD-10-CM | POA: Diagnosis not present

## 2023-06-18 DIAGNOSIS — M7552 Bursitis of left shoulder: Secondary | ICD-10-CM | POA: Diagnosis not present

## 2023-06-18 NOTE — Telephone Encounter (Signed)
sent to [facility] they obtain Judy Evans, [place] medicaid [place] [phone]
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
## 2023-06-19 ENCOUNTER — Other Ambulatory Visit: Payer: Self-pay | Admitting: Family Medicine

## 2023-06-19 DIAGNOSIS — K219 Gastro-esophageal reflux disease without esophagitis: Secondary | ICD-10-CM

## 2023-06-20 ENCOUNTER — Ambulatory Visit (INDEPENDENT_AMBULATORY_CARE_PROVIDER_SITE_OTHER): Payer: Medicare HMO | Admitting: Acute Care

## 2023-06-20 ENCOUNTER — Encounter: Payer: Self-pay | Admitting: Acute Care

## 2023-06-20 DIAGNOSIS — F17210 Nicotine dependence, cigarettes, uncomplicated: Secondary | ICD-10-CM

## 2023-06-20 NOTE — Progress Notes (Signed)
Virtual Visit via Telephone Note  I connected with Trinda Pascal on 06/20/23 at 10:30 AM EDT by telephone and verified that I am speaking with the correct person using two identifiers.  Location: Patient:  At home Provider:  [address], [place], [place], [address]    I discussed the limitations, risks, security and privacy concerns of performing an evaluation and management service by telephone and the availability of in person appointments. I also discussed with the patient that there may be a patient responsible charge related to this service. The patient expressed understanding and agreed to proceed.    Shared Decision Making Visit Lung Cancer Screening Program [phone])   Eligibility: Age 52 y.o. Pack Years Smoking History Calculation 47.5 pack years (# packs/per year x # years smoked) Recent History of coughing up blood  no Unexplained weight loss? no ( >Than 15 pounds within the last 6 months ) Prior History Lung / other cancer no (Diagnosis within the last 5 years already requiring surveillance chest CT Scans). Smoking Status Current Smoker Former Smokers: Years since quit:  NA  Quit Date:  NA  Visit Components: Discussion included one or more decision making aids. yes Discussion included risk/benefits of screening. yes Discussion included potential follow up diagnostic testing for abnormal scans. yes Discussion included meaning and risk of over diagnosis. yes Discussion included meaning and risk of False Positives. yes Discussion included meaning of total radiation exposure. yes  Counseling Included: Importance of adherence to annual lung cancer LDCT screening. yes Impact of comorbidities on ability to participate in the program. yes Ability and willingness to under diagnostic treatment. yes  Smoking Cessation Counseling: Current Smokers:  Discussed importance of smoking cessation. yes Information about tobacco cessation classes and interventions  provided to patient. yes Patient provided with "ticket" for LDCT Scan. yes Symptomatic Patient. no  Counseling NA Diagnosis Code: Tobacco Use Z72.0 Asymptomatic Patient yes  Counseling (Intermediate counseling: > three minutes counseling) U0454 Former Smokers:  Discussed the importance of maintaining cigarette abstinence. yes Diagnosis Code: Personal History of Nicotine Dependence. U98.119 Information about tobacco cessation classes and interventions provided to patient. Yes Patient provided with "ticket" for LDCT Scan. yes Written Order for Lung Cancer Screening with LDCT placed in Epic. Yes (CT Chest Lung Cancer Screening Low Dose W/O CM) [id number] Z12.2-Screening of respiratory organs Z87.891-Personal history of nicotine dependence  I have spent 25 minutes of face to face/ virtual visit   time with  Ms. Kilmartin discussing the risks and benefits of lung cancer screening. We viewed / discussed a power point together that explained in detail the above noted topics. We paused at intervals to allow for questions to be asked and answered to ensure understanding.We discussed that the single most powerful action that she can take to decrease her risk of developing lung cancer is to quit smoking. We discussed whether or not she is ready to commit to setting a quit date. We discussed options for tools to aid in quitting smoking including nicotine replacement therapy, non-nicotine medications, support groups, Quit Smart classes, and behavior modification. We discussed that often times setting smaller, more achievable goals, such as eliminating 1 cigarette a day for a week and then 2 cigarettes a day for a week can be helpful in slowly decreasing the number of cigarettes smoked. This allows for a sense of accomplishment as well as providing a clinical benefit. I provided  her  with smoking cessation  information  with contact information for community resources, classes, free  nicotine replacement therapy, and  access to mobile apps, text messaging, and on-line smoking cessation help. I have also provided  her  the office contact information in the event she needs to contact me, or the screening staff. We discussed the time and location of the scan, and that either Abigail Miyamoto RN, Karlton Lemon, RN  or I will call / send a letter with the results within 24-72 hours of receiving them. The patient verbalized understanding of all of  the above and had no further questions upon leaving the office. They have my contact information in the event they have any further questions.  I spent 3-4 minutes counseling on smoking cessation and the health risks of continued tobacco abuse.  I explained to the patient that there has been a high incidence of coronary artery disease noted on these exams. I explained that this is a non-gated exam therefore degree or severity cannot be determined. This patient is not on statin therapy per Epic review. I have asked the patient to follow-up with their PCP regarding any incidental finding of coronary artery disease and management with diet or medication as their PCP  feels is clinically indicated. The patient verbalized understanding of the above and had no further questions upon completion of the visit.      Bevelyn Ngo, NP 06/20/2023

## 2023-06-20 NOTE — Patient Instructions (Signed)

## 2023-06-21 ENCOUNTER — Inpatient Hospital Stay: Admission: RE | Admit: 2023-06-21 | Payer: Medicare HMO | Source: Ambulatory Visit

## 2023-07-02 ENCOUNTER — Encounter: Payer: Self-pay | Admitting: Emergency Medicine

## 2023-07-13 ENCOUNTER — Encounter: Payer: Self-pay | Admitting: Neurology

## 2023-07-17 ENCOUNTER — Encounter: Payer: Self-pay | Admitting: Neurology

## 2023-07-20 ENCOUNTER — Ambulatory Visit
Admission: RE | Admit: 2023-07-20 | Discharge: 2023-07-20 | Disposition: A | Discharge status: Home / Self Care | Payer: Medicare HMO | Source: Ambulatory Visit | Attending: Neurology | Admitting: Neurology

## 2023-07-20 DIAGNOSIS — Z8673 Personal history of transient ischemic attack (TIA), and cerebral infarction without residual deficits: Secondary | ICD-10-CM | POA: Diagnosis not present

## 2023-07-20 DIAGNOSIS — G43709 Chronic migraine without aura, not intractable, without status migrainosus: Secondary | ICD-10-CM | POA: Diagnosis not present

## 2023-07-20 DIAGNOSIS — R200 Anesthesia of skin: Secondary | ICD-10-CM | POA: Diagnosis not present

## 2023-07-20 MED ORDER — GADOPICLENOL 0.5 MMOL/ML IV SOLN
9.0000 mL | Freq: Once | INTRAVENOUS | Status: AC | PRN
  Administered 2023-07-20: 9 mL via INTRAVENOUS

## 2023-07-27 ENCOUNTER — Encounter: Payer: Self-pay | Admitting: Family Medicine

## 2023-07-27 ENCOUNTER — Telehealth (INDEPENDENT_AMBULATORY_CARE_PROVIDER_SITE_OTHER): Payer: Medicare HMO | Admitting: Family Medicine

## 2023-07-27 DIAGNOSIS — J301 Allergic rhinitis due to pollen: Secondary | ICD-10-CM

## 2023-07-27 DIAGNOSIS — J0140 Acute pansinusitis, unspecified: Secondary | ICD-10-CM

## 2023-07-27 DIAGNOSIS — J441 Chronic obstructive pulmonary disease with (acute) exacerbation: Secondary | ICD-10-CM

## 2023-07-27 MED ORDER — PREDNISONE 20 MG PO TABS: 40.0000 mg | ORAL_TABLET | Freq: Every day | ORAL | 0 refills | Status: AC

## 2023-07-27 MED ORDER — ALBUTEROL SULFATE HFA 108 (90 BASE) MCG/ACT IN AERS: INHALATION_SPRAY | RESPIRATORY_TRACT | 3 refills | Status: DC

## 2023-07-27 MED ORDER — DOXYCYCLINE HYCLATE 100 MG PO TABS: 100.0000 mg | ORAL_TABLET | Freq: Two times a day (BID) | ORAL | 0 refills | Status: AC

## 2023-07-27 MED ORDER — FLUTICASONE PROPIONATE 50 MCG/ACT NA SUSP: NASAL | 11 refills | Status: DC

## 2023-07-27 MED ORDER — GUAIFENESIN ER 600 MG PO TB12: 600.0000 mg | ORAL_TABLET | Freq: Two times a day (BID) | ORAL | 0 refills | Status: AC

## 2023-07-27 NOTE — Progress Notes (Signed)
Virtual Visit via Video   I connected with patient on 07/27/23 at 0905 by a video enabled telemedicine application and verified that I am speaking with the correct person using two identifiers.  Location patient: Home Location provider: [facility] Persons participating in the virtual visit: Patient and Provider  I discussed the limitations of evaluation and management by telemedicine and the availability of in person appointments. The patient expressed understanding and agreed to proceed.  Subjective:   HPI:  Pt presents today for  Chief Complaint  Patient presents with   Sinusitis   Discussed the use of AI scribe software for clinical note transcription with the patient, who gave verbal consent to proceed.  History of Present Illness   The patient, with a known history of COPD, presents with a two-week history of symptoms suggestive of a sinus infection. Initially, the patient experienced dark yellow mucus and a dry, irritated throat, with minimal coughing. Accompanying these symptoms were severe headaches. As the illness progressed into the second week, the patient began to experience throbbing in both ears.  To manage these symptoms, the patient has been using allergy medication, Flonase, inhalers, and over-the-counter sinus medication, all of which have been ineffective. The patient has not tried Mucinex. The sinus pressure is felt in both the forehead and under the cheeks.  In addition to these symptoms, the patient reports feeling constantly tired, but it is unclear whether this is related to the current illness or a potential exacerbation of her COPD. The patient has been using albuterol, but it is uncertain whether the supply is sufficient. The patient also reports that she may need a refill of Flonase.       Review of Systems  Constitutional:  Positive for chills, fever and malaise/fatigue. Negative for diaphoresis and weight loss.  HENT:   Positive for congestion, ear pain, sinus pain and sore throat. Negative for ear discharge, hearing loss, nosebleeds and tinnitus.   Eyes: Negative.   Respiratory:  Positive for cough, sputum production, shortness of breath and wheezing. Negative for hemoptysis and stridor.   Cardiovascular:  Negative for chest pain, palpitations, orthopnea, claudication, leg swelling and PND.  Gastrointestinal: Negative.   Genitourinary: Negative.   Musculoskeletal: Negative.   Skin:  Negative for itching and rash.  Neurological:  Positive for headaches. Negative for dizziness, tingling, tremors, sensory change, speech change, focal weakness, seizures, loss of consciousness and weakness.  Endo/Heme/Allergies: Negative.   Psychiatric/Behavioral: Negative.    All other systems reviewed and are negative.    Patient Active Problem List   Diagnosis Date Noted   Polyneuropathy 04/18/2023   BMI 34.0-34.9,adult 03/07/2023   Anxiety 03/07/2023   Vitamin D deficiency 03/07/2023   Precordial chest pain 01/11/2022   Complex regional pain syndrome type 1 of left upper extremity 03/04/2021   Subacromial bursitis of right shoulder joint 11/24/2020   Trochanteric bursitis of right hip 11/24/2020   Pain of left lower leg 11/15/2020   Bilateral low back pain with right-sided sciatica 11/15/2020   Thrush 11/15/2020   Insomnia 11/19/2018   Other headache syndrome 07/18/2018   Occipital neuralgia 07/18/2018   Family history of cerebral aneurysm 07/18/2018   Numbness 04/23/2018   Cervical radiculopathy 04/23/2018   Encounter for long-term use of opiate analgesic 04/22/2018   Lumbar degenerative disc disease 04/22/2018   Spondylosis of lumbar spine 04/22/2018   Fibromyalgia affecting multiple sites 04/22/2018   Constipation due to pain medication 04/30/2017   Depression 03/22/2017   Trochanteric  bursitis of left hip 03/13/2017   Neck pain 11/13/2016   Left shoulder pain 11/13/2016   Chronic bilateral low back  pain 11/13/2016   History of colonic polyps 06/20/2016   IBS (irritable bowel syndrome) 06/20/2016   Epigastric pain 06/20/2016   Rhinitis, chronic 04/13/2016   Otalgia of both ears 04/13/2016   Arthralgia of right temporomandibular joint 04/13/2016   BMI 40.0-44.9, adult (HCC) 11/22/2015   Bilateral carpal tunnel syndrome 11/22/2015   Tobacco abuse 11/22/2015   Allergic rhinitis 11/01/2015   Gastroesophageal reflux disease 11/01/2015   LLQ pain 06/18/2015   Bloating 06/18/2015   Pain with urination 02/19/2015   Moody 01/22/2015   Pelvic pain in female 01/13/2015   Dysuria 01/13/2015   Hot flashes due to surgical menopause 01/13/2015   Pre-diabetes 01/13/2015   Hypertension 01/13/2015   Chronic migraine w/o aura, not intractable, w/o stat migr 01/13/2015   Psoriasis 01/13/2015   Arthritis 01/13/2015   Fibromyalgia 01/13/2015   COPD mixed type (HCC) 01/13/2015   Hyperlipidemia 01/13/2015   Abnormal nuclear stress test 01/07/2014   Abdominal pain 04/16/2013   Pain in joint involving multiple sites 02/20/2013   Head trauma 02/20/2013    Social History   Tobacco Use   Smoking status: Every Day    Current packs/day: 1.50    Average packs/day: 1.5 packs/day for 30.0 years (45.0 ttl pk-yrs)    Types: Cigarettes   Smokeless tobacco: Never  Substance Use Topics   Alcohol use: No    Comment: rarely    Current Outpatient Medications:    doxycycline (VIBRA-TABS) 100 MG tablet, Take 1 tablet (100 mg total) by mouth 2 (two) times daily for 10 days. 1 po bid, Disp: 20 tablet, Rfl: 0   guaiFENesin (MUCINEX) 600 MG 12 hr tablet, Take 1 tablet (600 mg total) by mouth 2 (two) times daily for 10 days., Disp: 20 tablet, Rfl: 0   predniSONE (DELTASONE) 20 MG tablet, Take 2 tablets (40 mg total) by mouth daily with breakfast for 5 days. 2 po daily for 5 days, Disp: 10 tablet, Rfl: 0   albuterol (VENTOLIN HFA) 108 (90 Base) MCG/ACT inhaler, USE 2 PUFFS BY MOUTH EVERY 4 HOURS AS NEEDED FOR  WHEEZING OR SHORTNESS OF BREATH, Disp: 18 each, Rfl: 3   amitriptyline (ELAVIL) 25 MG tablet, Two po qHS, Disp: 180 tablet, Rfl: 3   Budeson-Glycopyrrol-Formoterol (BREZTRI AEROSPHERE) 160-9-4.8 MCG/ACT AERO, Inhale 2 puffs into the lungs 2 (two) times daily., Disp: 10.7 g, Rfl: 11   celecoxib (CELEBREX) 200 MG capsule, one po qd, Disp: 90 capsule, Rfl: 3   cetirizine (ZYRTEC) 10 MG tablet, Take 1 tablet (10 mg total) by mouth daily., Disp: 90 tablet, Rfl: 3   COSENTYX SENSOREADY, 300 MG, 150 MG/ML SOAJ, , Disp: , Rfl:    fish oil-omega-3 fatty acids 1000 MG capsule, Take 2 capsules (2 g total) by mouth daily., Disp: 120 capsule, Rfl: 2   fluticasone (FLONASE) 50 MCG/ACT nasal spray, Administer 2 sprays in each nostril daily., Disp: 18.2 mL, Rfl: 11   Fremanezumab-vfrm (AJOVY) 225 MG/1.5ML SOSY, INJECT 1 SYRINGE UNDER THE SKIN EVERY 28 DAYS., Disp: 4.5 mL, Rfl: 3   gabapentin (NEURONTIN) 800 MG tablet, Take 1 tablet (800 mg total) by mouth 4 (four) times daily. (Needs to be seen before next refill), Disp: 360 tablet, Rfl: 3   hydrOXYzine (VISTARIL) 25 MG capsule, TAKE 2 CAPSULES (50 MG TOTAL) BY MOUTH EVERY 8 (EIGHT) HOURS AS NEEDED FOR ANXIETY., Disp: 540 capsule, Rfl:  0   ipratropium-albuterol (DUONEB) 0.5-2.5 (3) MG/3ML SOLN, INHALE 3 ML BY NEBULIZER EVERY 6 HOURS AS NEEDED, Disp: 180 mL, Rfl: 2   lidocaine (LIDODERM) 5 %, Place 1 patch onto the skin daily. Remove & Discard patch within 12 hours or as directed by MD, Disp: 30 patch, Rfl: 5   nystatin ointment (MYCOSTATIN), Apply 1 application topically 2 (two) times daily as needed., Disp: 30 g, Rfl: 0   omeprazole (PRILOSEC) 40 MG capsule, TAKE 1 CAPSULE BY MOUTH EVERY DAY, Disp: 90 capsule, Rfl: 1   ondansetron (ZOFRAN-ODT) 8 MG disintegrating tablet, Take 1 tablet (8 mg total) by mouth every 8 (eight) hours as needed for nausea or vomiting., Disp: 20 tablet, Rfl: 3   Semaglutide-Weight Management 1.7 MG/0.75ML SOAJ, Inject 1.7 mg into the skin  once a week for 28 days. (Patient not taking: Reported on 06/18/2023), Disp: 3 mL, Rfl: 0   [START ON 08/12/2023] Semaglutide-Weight Management 2.4 MG/0.75ML SOAJ, Inject 2.4 mg into the skin once a week for 28 days. (Patient not taking: Reported on 06/18/2023), Disp: 3 mL, Rfl: 0   triamcinolone cream (KENALOG) 0.1 %, , Disp: , Rfl:    triamcinolone ointment (KENALOG) 0.5 %, Apply topically 2 (two) times daily., Disp: , Rfl:   Allergies  Allergen Reactions   Rosuvastatin Calcium Swelling    Ends up in the hospital   Statins Swelling    Ends up in the hospital   Wellbutrin [Bupropion]     suicidal   Hctz [Hydrochlorothiazide] Nausea And Vomiting and Rash   Hydrocodone-Acetaminophen Rash   Oxycodone Rash   Penicillins Nausea And Vomiting and Rash    Has patient had a PCN reaction causing immediate rash, facial/tongue/throat swelling, SOB or lightheadedness with hypotension: Yes Has patient had a PCN reaction causing severe rash involving mucus membranes or skin necrosis: No Has patient had a PCN reaction that required hospitalization No Has patient had a PCN reaction occurring within the last 10 years: No If all of the above answers are "NO", then may proceed with Cephalosporin use.     Objective:   There were no vitals taken for this visit.  Patient is well-developed, well-nourished in no acute distress.  Resting comfortably at home.  Head is normocephalic, atraumatic.  No labored breathing. Wheezing noted. Raspy voice.  Speech is clear and coherent with logical content.  Patient is alert and oriented at baseline.    Assessment and Plan:   Judy Evans was seen today for sinusitis.  Diagnoses and all orders for this visit:  Acute non-recurrent pansinusitis -     guaiFENesin (MUCINEX) 600 MG 12 hr tablet; Take 1 tablet (600 mg total) by mouth 2 (two) times daily for 10 days. -     predniSONE (DELTASONE) 20 MG tablet; Take 2 tablets (40 mg total) by mouth daily with breakfast for  5 days. 2 po daily for 5 days -     doxycycline (VIBRA-TABS) 100 MG tablet; Take 1 tablet (100 mg total) by mouth 2 (two) times daily for 10 days. 1 po bid -     fluticasone (FLONASE) 50 MCG/ACT nasal spray; Administer 2 sprays in each nostril daily.  Seasonal allergic rhinitis due to pollen -     fluticasone (FLONASE) 50 MCG/ACT nasal spray; Administer 2 sprays in each nostril daily.  Chronic obstructive pulmonary disease with acute exacerbation (HCC) -     albuterol (VENTOLIN HFA) 108 (90 Base) MCG/ACT inhaler; USE 2 PUFFS BY MOUTH EVERY 4 HOURS AS NEEDED  FOR WHEEZING OR SHORTNESS OF BREATH  Assessment and Plan    Sinusitis Ongoing symptoms for two weeks with dark yellow mucus, headaches, and facial pain. No improvement with allergy medication, Flonase, inhalers, breathing treatments, and over-the-counter sinus medication. -Continue Flonase. -Add Mucinex with lots of water. -Start doxycycline and prednisone. -Refill Flonase prescription.  COPD No significant increase in shortness of breath or fatigue. Concern for potential exacerbation due to ongoing sinusitis. -Ensure adequate supply of albuterol for breakthrough symptoms. -Refill albuterol prescription if needed. -Monitor closely for any worsening symptoms or signs of COPD exacerbation.  Follow-up If no improvement or worsening symptoms after a couple of days of prednisone, Mucinex, and doxycycline, patient to notify the office.          Return if symptoms worsen or fail to improve.  Kari Baars, FNP-C [facility] [address] [place], [place] [address] [phone]  07/27/2023  Time spent with the patient: 15 minutes, of which >50% was spent in obtaining information about symptoms, reviewing previous labs, evaluations, and treatments, counseling about condition (please see the discussed topics above), and developing a plan to further investigate it; had a number of questions which I  addressed.

## 2023-08-20 ENCOUNTER — Other Ambulatory Visit: Payer: Self-pay | Admitting: Family Medicine

## 2023-08-20 DIAGNOSIS — F419 Anxiety disorder, unspecified: Secondary | ICD-10-CM

## 2023-08-22 ENCOUNTER — Other Ambulatory Visit: Payer: Self-pay | Admitting: Family Medicine

## 2023-08-22 DIAGNOSIS — J449 Chronic obstructive pulmonary disease, unspecified: Secondary | ICD-10-CM

## 2023-09-24 ENCOUNTER — Telehealth (INDEPENDENT_AMBULATORY_CARE_PROVIDER_SITE_OTHER): Payer: Medicare HMO | Admitting: Family Medicine

## 2023-09-24 DIAGNOSIS — J40 Bronchitis, not specified as acute or chronic: Secondary | ICD-10-CM

## 2023-09-24 DIAGNOSIS — J329 Chronic sinusitis, unspecified: Secondary | ICD-10-CM

## 2023-09-24 MED ORDER — MOXIFLOXACIN HCL 400 MG PO TABS: 400.0000 mg | ORAL_TABLET | Freq: Every day | ORAL | 0 refills | Status: DC

## 2023-09-24 MED ORDER — PREDNISONE 10 MG PO TABS: ORAL_TABLET | ORAL | 0 refills | Status: DC

## 2023-09-24 NOTE — Progress Notes (Unsigned)
Subjective:    Patient ID: Judy Evans, female    DOB: April 10, 1971, 51 y.o.   MRN: [id number]   HPI: Judy Evans is a 52 y.o. female presenting for nasal congestion. Cough. IN a lot of pain at costal margin bilaterally. Cough producing yellow mucoid sputum. Some from nose as well. A little dyspneic with the cough. Denies fever. Has been hot and cold though. Onset 3 days ago.       04/18/2023   11:14 AM 03/07/2023   12:02 PM 01/10/2023    1:55 PM 12/29/2022    9:19 AM 09/05/2022    1:47 PM  Depression screen PHQ 2/9  Decreased Interest 1 1 0 2 0  Down, Depressed, Hopeless 0 0 0 0 0  PHQ - 2 Score 1 1 0 2 0  Altered sleeping 0 0 0 1 0  Tired, decreased energy 1 1 0 3 0  Change in appetite 0 0 0 2 0  Feeling bad or failure about yourself  0 0 0 0 0  Trouble concentrating 1 1 0 1 0  Moving slowly or fidgety/restless 1 1 0 0 0  Suicidal thoughts 0 0 0 0 0  PHQ-9 Score 4 4 0 9 0  Difficult doing work/chores Somewhat difficult Somewhat difficult Not difficult at all Somewhat difficult Not difficult at all     Relevant past medical, surgical, family and social history reviewed and updated as indicated.  Interim medical history since our last visit reviewed. Allergies and medications reviewed and updated.  ROS:  Review of Systems   Social History   Tobacco Use  Smoking Status Every Day   Current packs/day: 1.50   Average packs/day: 1.5 packs/day for 30.0 years (45.0 ttl pk-yrs)   Types: Cigarettes  Smokeless Tobacco Never       Objective:     Wt Readings from Last 3 Encounters:  06/18/23 195 lb (88.5 kg)  04/18/23 190 lb (86.2 kg)  03/07/23 187 lb 12.8 oz (85.2 kg)     Exam deferred. Video visit performed.   Assessment & Plan:  No diagnosis found.  Meds ordered this encounter  Medications   moxifloxacin (AVELOX) 400 MG tablet    Sig: Take 1 tablet (400 mg total) by mouth daily.    Dispense:  10 tablet    Refill:  0   predniSONE (DELTASONE) 10  MG tablet    Sig: Take 5 daily for 2 days followed by 4,3,2 and 1 for 2 days each.    Dispense:  30 tablet    Refill:  0    No orders of the defined types were placed in this encounter.     There are no diagnoses linked to this encounter.  Virtual Visit  Note  I discussed the limitations, risks, security and privacy concerns of performing an evaluation and management service by video and the availability of in person appointments. The patient was identified with two identifiers. Pt.expressed understanding and agreed to proceed. Pt. Is at home. Dr. Darlyn Read is in his office.  Follow Up Instructions:   I discussed the assessment and treatment plan with the patient. The patient was provided an opportunity to ask questions and all were answered. The patient agreed with the plan and demonstrated an understanding of the instructions.   The patient was advised to call back or seek an in-person evaluation if the symptoms worsen or if the condition fails to improve as anticipated.   Total minutes contact time: ***  Follow up plan: No follow-ups on file.  Mechele Claude, MD [facility]

## 2023-09-27 ENCOUNTER — Encounter: Payer: Self-pay | Admitting: Family Medicine

## 2023-10-02 DIAGNOSIS — L400 Psoriasis vulgaris: Secondary | ICD-10-CM | POA: Diagnosis not present

## 2023-10-04 ENCOUNTER — Encounter: Payer: Self-pay | Admitting: Family Medicine

## 2023-10-04 ENCOUNTER — Ambulatory Visit (INDEPENDENT_AMBULATORY_CARE_PROVIDER_SITE_OTHER): Payer: 59 | Admitting: Family Medicine

## 2023-10-04 VITALS — BP 108/64 | HR 86 | Temp 98.7°F | Ht 62.0 in | Wt 195.0 lb

## 2023-10-04 DIAGNOSIS — B370 Candidal stomatitis: Secondary | ICD-10-CM

## 2023-10-04 DIAGNOSIS — J40 Bronchitis, not specified as acute or chronic: Secondary | ICD-10-CM

## 2023-10-04 DIAGNOSIS — J441 Chronic obstructive pulmonary disease with (acute) exacerbation: Secondary | ICD-10-CM

## 2023-10-04 DIAGNOSIS — J329 Chronic sinusitis, unspecified: Secondary | ICD-10-CM

## 2023-10-04 MED ORDER — NYSTATIN 100000 UNIT/ML MT SUSP: 5.0000 mL | Freq: Four times a day (QID) | OROMUCOSAL | 0 refills | Status: DC

## 2023-10-04 MED ORDER — PREDNISONE 20 MG PO TABS: 40.0000 mg | ORAL_TABLET | Freq: Every day | ORAL | 0 refills | Status: AC

## 2023-10-04 NOTE — Progress Notes (Signed)
 Subjective:  Patient ID: Judy Evans, female    DOB: 07-01-1971, 53 y.o.   MRN: [id number]  Patient Care Team: Severa Rock HERO, FNP as PCP - General (Family Medicine) Lavona Agent, MD as PCP - Cardiology (Cardiology) Shaaron Lamar HERO, MD as Consulting Physician (Gastroenterology) Eda Baxter, MD as Referring Physician (Dermatology) Rakes, Rock HERO, FNP as Nurse Practitioner (Family Medicine)   Chief Complaint:  cough congestion mucus (X 2 weeks/Abx and steroid treatment done, asking for another round)  HPI: Judy Evans is a 53 y.o. female presenting on 10/04/2023 for cough congestion mucus (X 2 weeks/Abx and steroid treatment done, asking for another round) Patient was treated on 12/30 via video visit for sinobronchitis. She has a PMH significant for COPD mixed type. She states that she completed antibiotics and prednisone  burst, but continues to have cough and rattling in her chest. States that she is producing yellow-brown mucus with cough. She continues to take mucinex  and zyrtec , albuterol , and breztri . Using nebulizer every 4-5 hours. States that sometimes she has done two together because she feels so congested. Denies rhinorrhea,  Endorses continued PND. States that she has not taste now. In addition, states that she feels like she has thrush in her mouth. Denies fever.   Relevant past medical, surgical, family, and social history reviewed and updated as indicated.  Allergies and medications reviewed and updated. Data reviewed: Chart in Epic.   Past Medical History:  Diagnosis Date   Anxiety    Aphasia    Arthritis    COPD (chronic obstructive pulmonary disease) (HCC)    CRPS (complex regional pain syndrome type I)    DDD (degenerative disc disease), cervical    Degenerative disc disease at L5-S1 level    Fibromyalgia    GERD (gastroesophageal reflux disease)    IBS (irritable bowel syndrome)    Migraines    Moody 01/22/2015   Psoriasis    Scoliosis     Spondylolysis     Past Surgical History:  Procedure Laterality Date   ABDOMINAL HYSTERECTOMY     BIOPSY  09/14/2016   Procedure: BIOPSY;  Surgeon: Lamar HERO Shaaron, MD;  Location: [facility];  Service: Endoscopy;;  gastric    CARDIAC CATHETERIZATION  2015   patient reported it to be normal. I was dying in pain during procedure.    CARPAL TUNNEL RELEASE Right    CESAREAN SECTION     CHOLECYSTECTOMY     COLONOSCOPY  06/2014   Oliva DeMason: normal   DILATION AND CURETTAGE OF UTERUS     ESOPHAGOGASTRODUODENOSCOPY  07/2014   Oliva Altes: Normal   ESOPHAGOGASTRODUODENOSCOPY (EGD) WITH PROPOFOL  N/A 09/14/2016   Dr. Shaaron: Normal esophagus status post empiric dilation, erosive gastropathy with biopsies showing chronic inactive gastritis, no H pylori.   EXTERNAL FIXATION OF FINGER Left 12/01/2021   Procedure: LEFT RING DIGIT WIDGET PLACEMENT;  Surgeon: Murrell Drivers, MD;  Location: [facility];  Service: Orthopedics;  Laterality: Left;   FOOT SURGERY Bilateral    removal of heel spurs   MALONEY DILATION  09/14/2016   Procedure: MALONEY DILATION;  Surgeon: Lamar HERO Shaaron, MD;  Location: [facility];  Service: Endoscopy;;   rotator cuff surgery Right    TONSILLECTOMY      Social History   Socioeconomic History   Marital status: Legally Separated    Spouse name: Not on file   Number of children: 2   Years of education: GED   Highest  education level: GED or equivalent  Occupational History   Occupation: Disabled  Tobacco Use   Smoking status: Every Day    Current packs/day: 1.50    Average packs/day: 1.5 packs/day for 30.0 years (45.0 ttl pk-yrs)    Types: Cigarettes   Smokeless tobacco: Never  Vaping Use   Vaping status: Former  Substance and Sexual Activity   Alcohol use: No    Comment: rarely   Drug use: Yes    Types: Marijuana   Sexual activity: Not Currently    Birth control/protection: Surgical    Comment: hyst  Other Topics Concern   Not on  file  Social History Narrative   Caffeine use: Drinks coffee (2 cups per day), tea/soda- 2-3 cups per day   Right handed   Lives alone.   Lost oldest sister 07/2021 due to cardiac issues. Youngest sister died 27-Sep-2021 due to unknown causes.    Social Drivers of Corporate Investment Banker Strain: Low Risk  (09/24/2023)   Overall Financial Resource Strain (CARDIA)    Difficulty of Paying Living Expenses: Not very hard  Food Insecurity: No Food Insecurity (09/24/2023)   Hunger Vital Sign    Worried About Running Out of Food in the Last Year: Never true    Ran Out of Food in the Last Year: Never true  Transportation Needs: No Transportation Needs (09/24/2023)   PRAPARE - Administrator, Civil Service (Medical): No    Lack of Transportation (Non-Medical): No  Physical Activity: Inactive (09/24/2023)   Exercise Vital Sign    Days of Exercise per Week: 0 days    Minutes of Exercise per Session: 30 min  Stress: No Stress Concern Present (09/24/2023)   [employer] of Occupational Health - Occupational Stress Questionnaire    Feeling of Stress : Only a little  Social Connections: Socially Isolated (09/24/2023)   Social Connection and Isolation Panel [NHANES]    Frequency of Communication with Friends and Family: More than three times a week    Frequency of Social Gatherings with Friends and Family: Once a week    Attends Religious Services: Never    Database Administrator or Organizations: No    Attends Banker Meetings: Never    Marital Status: Separated  Intimate Partner Violence: Not At Risk (01/10/2023)   Humiliation, Afraid, Rape, and Kick questionnaire    Fear of Current or Ex-Partner: No    Emotionally Abused: No    Physically Abused: No    Sexually Abused: No    Outpatient Encounter Medications as of 10/04/2023  Medication Sig   albuterol  (VENTOLIN  HFA) 108 (90 Base) MCG/ACT inhaler USE 2 PUFFS BY MOUTH EVERY 4 HOURS AS NEEDED FOR WHEEZING OR  SHORTNESS OF BREATH   amitriptyline  (ELAVIL ) 25 MG tablet Two po qHS   Budeson-Glycopyrrol-Formoterol  (BREZTRI  AEROSPHERE) 160-9-4.8 MCG/ACT AERO Inhale 2 puffs into the lungs 2 (two) times daily.   celecoxib  (CELEBREX ) 200 MG capsule one po qd   cetirizine  (ZYRTEC ) 10 MG tablet Take 1 tablet (10 mg total) by mouth daily.   COSENTYX SENSOREADY, 300 MG, 150 MG/ML SOAJ    fish oil-omega-3 fatty acids  1000 MG capsule Take 2 capsules (2 g total) by mouth daily.   fluticasone  (FLONASE ) 50 MCG/ACT nasal spray Administer 2 sprays in each nostril daily.   Fremanezumab -vfrm (AJOVY ) 225 MG/1.5ML SOSY INJECT 1 SYRINGE UNDER THE SKIN EVERY 28 DAYS.   gabapentin  (NEURONTIN ) 800 MG tablet Take 1 tablet (800 mg total) by  mouth 4 (four) times daily. (Needs to be seen before next refill)   hydrOXYzine  (VISTARIL ) 25 MG capsule TAKE 2 CAPSULES (50 MG TOTAL) BY MOUTH EVERY 8 (EIGHT) HOURS AS NEEDED FOR ANXIETY.   ipratropium-albuterol  (DUONEB) 0.5-2.5 (3) MG/3ML SOLN INHALE 3 ML (CONTENTS OF 1 VIAL) BY NEBULIZER EVERY 6 HOURS AS NEEDED   lidocaine  (LIDODERM ) 5 % Place 1 patch onto the skin daily. Remove & Discard patch within 12 hours or as directed by MD   moxifloxacin  (AVELOX ) 400 MG tablet Take 1 tablet (400 mg total) by mouth daily.   nystatin  ointment (MYCOSTATIN ) Apply 1 application topically 2 (two) times daily as needed.   omeprazole  (PRILOSEC) 40 MG capsule TAKE 1 CAPSULE BY MOUTH EVERY DAY   ondansetron  (ZOFRAN -ODT) 8 MG disintegrating tablet Take 1 tablet (8 mg total) by mouth every 8 (eight) hours as needed for nausea or vomiting.   predniSONE  (DELTASONE ) 10 MG tablet Take 5 daily for 2 days followed by 4,3,2 and 1 for 2 days each.   triamcinolone  cream (KENALOG ) 0.1 %    triamcinolone  ointment (KENALOG ) 0.5 % Apply topically 2 (two) times daily.   No facility-administered encounter medications on file as of 10/04/2023.    Allergies  Allergen Reactions   Rosuvastatin Calcium Swelling    Ends up in  the hospital   Statins Swelling    Ends up in the hospital   Wellbutrin [Bupropion]     suicidal   Hctz [Hydrochlorothiazide] Nausea And Vomiting and Rash   Hydrocodone -Acetaminophen  Rash   Oxycodone  Rash   Penicillins Nausea And Vomiting and Rash    Has patient had a PCN reaction causing immediate rash, facial/tongue/throat swelling, SOB or lightheadedness with hypotension: Yes Has patient had a PCN reaction causing severe rash involving mucus membranes or skin necrosis: No Has patient had a PCN reaction that required hospitalization No Has patient had a PCN reaction occurring within the last 10 years: No If all of the above answers are NO, then may proceed with Cephalosporin use.    Review of Systems As per HPI  Objective:  BP 108/64   Pulse 86   Temp 98.7 F (37.1 C)   Ht 5' 2 (1.575 m)   Wt 195 lb (88.5 kg)   SpO2 94%   BMI 35.67 kg/m    Wt Readings from Last 3 Encounters:  06/18/23 195 lb (88.5 kg)  04/18/23 190 lb (86.2 kg)  03/07/23 187 lb 12.8 oz (85.2 kg)   Physical Exam Constitutional:      General: She is awake. She is not in acute distress.    Appearance: Normal appearance. She is well-developed and well-groomed. She is not ill-appearing, toxic-appearing or diaphoretic.  HENT:     Right Ear: A middle ear effusion is present. There is no impacted cerumen. Tympanic membrane is scarred. Tympanic membrane is not perforated, erythematous, retracted or bulging.     Left Ear: A middle ear effusion is present. There is no impacted cerumen. Tympanic membrane is scarred. Tympanic membrane is not perforated, erythematous, retracted or bulging.     Mouth/Throat:     Lips: Pink. No lesions.     Mouth: Mucous membranes are moist.     Tongue: Lesions present.     Palate: Lesions present.     Pharynx: Posterior oropharyngeal erythema present.     Tonsils: No tonsillar exudate or tonsillar abscesses.     Comments: Erythematous raised lesions along upper palate and tongue   Cardiovascular:  Rate and Rhythm: Normal rate and regular rhythm.     Pulses: Normal pulses.          Radial pulses are 2+ on the right side and 2+ on the left side.       Posterior tibial pulses are 2+ on the right side and 2+ on the left side.     Heart sounds: Normal heart sounds. No murmur heard.    No gallop.  Pulmonary:     Effort: Pulmonary effort is normal. No respiratory distress.     Breath sounds: Decreased air movement present. No stridor or transmitted upper airway sounds. Examination of the right-middle field reveals decreased breath sounds. Examination of the left-middle field reveals decreased breath sounds. Examination of the right-lower field reveals decreased breath sounds. Examination of the left-lower field reveals decreased breath sounds. Decreased breath sounds present. No wheezing, rhonchi or rales.  Musculoskeletal:     Cervical back: Full passive range of motion without pain and neck supple.     Right lower leg: No edema.     Left lower leg: No edema.  Lymphadenopathy:     Head:     Right side of head: No submental, submandibular, tonsillar, preauricular or posterior auricular adenopathy.     Left side of head: No submental, submandibular, tonsillar, preauricular or posterior auricular adenopathy.  Skin:    General: Skin is warm.     Capillary Refill: Capillary refill takes less than 2 seconds.  Neurological:     General: No focal deficit present.     Mental Status: She is alert, oriented to person, place, and time and easily aroused. Mental status is at baseline.     GCS: GCS eye subscore is 4. GCS verbal subscore is 5. GCS motor subscore is 6.     Motor: No weakness.  Psychiatric:        Attention and Perception: Attention and perception normal.        Mood and Affect: Mood and affect normal.        Speech: Speech normal.        Behavior: Behavior normal. Behavior is cooperative.        Thought Content: Thought content normal. Thought content does not  include homicidal or suicidal ideation. Thought content does not include homicidal or suicidal plan.        Cognition and Memory: Cognition and memory normal.        Judgment: Judgment normal.     Results for orders placed or performed in visit on 03/07/23  Bayer DCA Hb A1c Waived   Collection Time: 03/07/23 12:00 AM  Result Value Ref Range   HB A1C (BAYER DCA - WAIVED) 5.4 4.8 - 5.6 %  CMP14+EGFR   Collection Time: 03/07/23 12:48 PM  Result Value Ref Range   Glucose 128 (H) 70 - 99 mg/dL   BUN 6 6 - 24 mg/dL   Creatinine, Ser 9.31 0.57 - 1.00 mg/dL   eGFR 894 >40 fO/fpw/8.26   BUN/Creatinine Ratio 9 9 - 23   Sodium 142 134 - 144 mmol/L   Potassium 4.2 3.5 - 5.2 mmol/L   Chloride 103 96 - 106 mmol/L   CO2 23 20 - 29 mmol/L   Calcium 9.4 8.7 - 10.2 mg/dL   Total Protein 6.4 6.0 - 8.5 g/dL   Albumin 4.4 3.8 - 4.9 g/dL   Globulin, Total 2.0 1.5 - 4.5 g/dL   Albumin/Globulin Ratio 2.2    Bilirubin Total 0.4 0.0 - 1.2 mg/dL  Alkaline Phosphatase 114 44 - 121 IU/L   AST 13 0 - 40 IU/L   ALT 12 0 - 32 IU/L  CBC with Differential/Platelet   Collection Time: 03/07/23 12:48 PM  Result Value Ref Range   WBC 5.7 3.4 - 10.8 x10E3/uL   RBC 5.24 3.77 - 5.28 x10E6/uL   Hemoglobin 16.6 (H) 11.1 - 15.9 g/dL   Hematocrit 49.4 (H) 65.9 - 46.6 %   MCV 96 79 - 97 fL   MCH 31.7 26.6 - 33.0 pg   MCHC 32.9 31.5 - 35.7 g/dL   RDW 87.3 88.2 - 84.5 %   Platelets 231 150 - 450 x10E3/uL   Neutrophils 54 Not Estab. %   Lymphs 36 Not Estab. %   Monocytes 4 Not Estab. %   Eos 5 Not Estab. %   Basos 1 Not Estab. %   Neutrophils Absolute 3.1 1.4 - 7.0 x10E3/uL   Lymphocytes Absolute 2.0 0.7 - 3.1 x10E3/uL   Monocytes Absolute 0.3 0.1 - 0.9 x10E3/uL   EOS (ABSOLUTE) 0.3 0.0 - 0.4 x10E3/uL   Basophils Absolute 0.0 0.0 - 0.2 x10E3/uL   Immature Granulocytes 0 Not Estab. %   Immature Grans (Abs) 0.0 0.0 - 0.1 x10E3/uL  Lipid panel   Collection Time: 03/07/23 12:48 PM  Result Value Ref Range    Cholesterol, Total 235 (H) 100 - 199 mg/dL   Triglycerides 876 0 - 149 mg/dL   HDL 61 >60 mg/dL   VLDL Cholesterol Cal 22 5 - 40 mg/dL   LDL Chol Calc (NIH) 847 (H) 0 - 99 mg/dL   Chol/HDL Ratio 3.9 0.0 - 4.4 ratio  Thyroid  Panel With TSH   Collection Time: 03/07/23 12:48 PM  Result Value Ref Range   TSH 1.060 0.450 - 4.500 uIU/mL   T4, Total 7.8 4.5 - 12.0 ug/dL   T3 Uptake Ratio 25 24 - 39 %   Free Thyroxine Index 2.0 1.2 - 4.9  VITAMIN D  25 Hydroxy (Vit-D Deficiency, Fractures)   Collection Time: 03/07/23 12:48 PM  Result Value Ref Range   Vit D, 25-Hydroxy 26.4 (L) 30.0 - 100.0 ng/mL       10/04/2023    3:13 PM 04/18/2023   11:14 AM 03/07/2023   12:02 PM 01/10/2023    1:55 PM 12/29/2022    9:19 AM  Depression screen PHQ 2/9  Decreased Interest 0 1 1 0 2  Down, Depressed, Hopeless 0 0 0 0 0  PHQ - 2 Score 0 1 1 0 2  Altered sleeping 0 0 0 0 1  Tired, decreased energy 1 1 1  0 3  Change in appetite 1 0 0 0 2  Feeling bad or failure about yourself  0 0 0 0 0  Trouble concentrating 0 1 1 0 1  Moving slowly or fidgety/restless 0 1 1 0 0  Suicidal thoughts 0 0 0 0 0  PHQ-9 Score 2 4 4  0 9  Difficult doing work/chores Not difficult at all Somewhat difficult Somewhat difficult Not difficult at all Somewhat difficult       10/04/2023    3:13 PM 04/18/2023   11:15 AM 03/07/2023   12:02 PM 12/29/2022    9:19 AM  GAD 7 : Generalized Anxiety Score  Nervous, Anxious, on Edge 1 2 2  0  Control/stop worrying 0 1 1 0  Worry too much - different things 1 2 2  0  Trouble relaxing 0 1 1 0  Restless 0 1 1 0  Easily  annoyed or irritable 1 2 2 1   Afraid - awful might happen 0 2 2 0  Total GAD 7 Score 3 11 11 1   Anxiety Difficulty Somewhat difficult Very difficult Very difficult Not difficult at all   Pertinent labs & imaging results that were available during my care of the patient were reviewed by me and considered in my medical decision making.  Assessment & Plan:  Keyanna was seen today  for cough congestion mucus.  Diagnoses and all orders for this visit:  Sinobronchitis Patient does not have infectious symptoms such as tachycardia, fever, worsening shortness of breath. Will repeat steroid burst as below. Recommend taking xyzal or allegra instead of zyrtec . Recommend continued at home therapy such as mucinex  and humidifier. Force fluids.  -     predniSONE  (DELTASONE ) 20 MG tablet; Take 2 tablets (40 mg total) by mouth daily with breakfast for 5 days.  Chronic obstructive pulmonary disease with acute exacerbation (HCC) As above.  -     predniSONE  (DELTASONE ) 20 MG tablet; Take 2 tablets (40 mg total) by mouth daily with breakfast for 5 days.  Thrush Will start medication as below. Discussed with patient that symptoms may be related to vitamin deficiency. If symptoms do not resolve, recommend follow up  -     nystatin  (MYCOSTATIN ) 100000 UNIT/ML suspension; Take 5 mLs (500,000 Units total) by mouth 4 (four) times daily. For 14 days  Continue all other maintenance medications.  Follow up plan: Return if symptoms worsen or fail to improve.  Continue healthy lifestyle choices, including diet (rich in fruits, vegetables, and lean proteins, and low in salt and simple carbohydrates) and exercise (at least 30 minutes of moderate physical activity daily).  Written and verbal instructions provided   The above assessment and management plan was discussed with the patient. The patient verbalized understanding of and has agreed to the management plan. Patient is aware to call the clinic if they develop any new symptoms or if symptoms persist or worsen. Patient is aware when to return to the clinic for a follow-up visit. Patient educated on when it is appropriate to go to the emergency department.   Marry Kins, DNP-FNP [facility] [address] [place], [place] [address] [phone]

## 2023-10-18 NOTE — Patient Instructions (Addendum)
Tips for success with GLP-1 Mediations (and by success, how not to be super sick on your stomach): Eat small meals AVOID heavy foods (fried/ high in carbs like bread, pasta, rice) AVOID carbonated beverages (soda/ beer, as these can increase bloating) DOUBLE your water intake (will help you avoid constipation/ dehydration)   GLP-1 Medications CAN cause: Nausea Abdominal pain Increased acid reflux (sometimes presents as "sour burps") Constipation OR Diarrhea Fatigue (especially when you first start it)

## 2023-10-19 ENCOUNTER — Ambulatory Visit (INDEPENDENT_AMBULATORY_CARE_PROVIDER_SITE_OTHER): Payer: 59 | Admitting: Family Medicine

## 2023-10-19 ENCOUNTER — Other Ambulatory Visit: Payer: Self-pay | Admitting: Family Medicine

## 2023-10-19 ENCOUNTER — Encounter: Payer: Self-pay | Admitting: Family Medicine

## 2023-10-19 VITALS — BP 130/81 | HR 89 | Temp 97.8°F | Ht 62.0 in | Wt 201.4 lb

## 2023-10-19 DIAGNOSIS — K219 Gastro-esophageal reflux disease without esophagitis: Secondary | ICD-10-CM

## 2023-10-19 DIAGNOSIS — F419 Anxiety disorder, unspecified: Secondary | ICD-10-CM

## 2023-10-19 DIAGNOSIS — I10 Essential (primary) hypertension: Secondary | ICD-10-CM

## 2023-10-19 DIAGNOSIS — E559 Vitamin D deficiency, unspecified: Secondary | ICD-10-CM

## 2023-10-19 DIAGNOSIS — E782 Mixed hyperlipidemia: Secondary | ICD-10-CM | POA: Diagnosis not present

## 2023-10-19 LAB — LIPID PANEL

## 2023-10-19 MED ORDER — SEMAGLUTIDE-WEIGHT MANAGEMENT 1 MG/0.5ML ~~LOC~~ SOAJ: 1.0000 mg | SUBCUTANEOUS | 0 refills | Status: DC

## 2023-10-19 MED ORDER — SEMAGLUTIDE-WEIGHT MANAGEMENT 2.4 MG/0.75ML ~~LOC~~ SOAJ: 2.4000 mg | SUBCUTANEOUS | 0 refills | Status: DC

## 2023-10-19 MED ORDER — SEMAGLUTIDE-WEIGHT MANAGEMENT 0.5 MG/0.5ML ~~LOC~~ SOAJ: 0.5000 mg | SUBCUTANEOUS | 0 refills | Status: AC

## 2023-10-19 MED ORDER — HYDROXYZINE PAMOATE 50 MG PO CAPS: 50.0000 mg | ORAL_CAPSULE | Freq: Three times a day (TID) | ORAL | 1 refills | Status: DC | PRN

## 2023-10-19 MED ORDER — SEMAGLUTIDE-WEIGHT MANAGEMENT 0.25 MG/0.5ML ~~LOC~~ SOAJ: 0.2500 mg | SUBCUTANEOUS | 0 refills | Status: AC

## 2023-10-19 MED ORDER — OMEPRAZOLE 40 MG PO CPDR: DELAYED_RELEASE_CAPSULE | ORAL | 1 refills | Status: DC

## 2023-10-19 MED ORDER — SEMAGLUTIDE-WEIGHT MANAGEMENT 1.7 MG/0.75ML ~~LOC~~ SOAJ: 1.7000 mg | SUBCUTANEOUS | 0 refills | Status: DC

## 2023-10-19 NOTE — Telephone Encounter (Signed)
WEGOVY 1 MG/0.5ML SOAJ        Changed from: Semaglutide-Weight Management 1 MG/0.5ML SOAJ    Pharmacy comment: Alternative Requested:NON FORMULARY DRUG; PLEASE CONTACT INSURANCE OR CONSIDER ALTERNATIVE.

## 2023-10-19 NOTE — Progress Notes (Signed)
Subjective:  Patient ID: Judy Evans, female    DOB: July 15, 1971, 53 y.o.   MRN: [id number]  Patient Care Team: Sonny Masters, FNP as PCP - General (Family Medicine) Rollene Rotunda, MD as PCP - Cardiology (Cardiology) Jena Gauss Gerrit Friends, MD as Consulting Physician (Gastroenterology) Marcelino Duster, MD as Referring Physician (Dermatology) Roseana Rhine, Doralee Albino, FNP as Nurse Practitioner (Family Medicine)   Chief Complaint:  Anxiety (5 month follow up )   HPI: Judy Evans is a 53 y.o. female presenting on 10/19/2023 for Anxiety (5 month follow up )   Discussed the use of AI scribe software for clinical note transcription with the patient, who gave verbal consent to proceed.  History of Present Illness   The patient, with a history of anxiety, gastroesophageal reflux disease (GERD), and vitamin D deficiency, presents with worsening anxiety symptoms. She reports needing to take hydroxyzine, initially used as needed, now every morning and night, and sometimes in between, without significant relief. She has tried Buspar in the past, but it led to suicidal ideation.  The patient also reports stress eating, leading to noticeable weight gain. She has been trying to watch her diet, but recent bouts of pneumonia and increased anxiety have led to a lapse in her exercise routine and an increase in late-night candy consumption.  Her GERD, managed with omeprazole, is well-controlled with no recurrent symptoms. She has been taking vitamin D supplements for a deficiency, which has led to an improvement in joint aches.  She has tried several daily medications in the past for anxiety, but none were effective. She is hesitant to start a daily medication again.  The patient has recently switched insurance providers and is exploring the possibility of getting Wegovy, a weight management medication, covered. She has not been exercising due to a recent bout of pneumonia. She has noticed a significant  weight gain of approximately 20 pounds since the summer.         10/19/2023   11:25 AM 10/04/2023    3:13 PM 04/18/2023   11:14 AM 03/07/2023   12:02 PM 01/10/2023    1:55 PM  Depression screen PHQ 2/9  Decreased Interest 1 0 1 1 0  Down, Depressed, Hopeless 0 0 0 0 0  PHQ - 2 Score 1 0 1 1 0  Altered sleeping 0 0 0 0 0  Tired, decreased energy 0 1 1 1  0  Change in appetite 1 1 0 0 0  Feeling bad or failure about yourself  0 0 0 0 0  Trouble concentrating 0 0 1 1 0  Moving slowly or fidgety/restless 0 0 1 1 0  Suicidal thoughts 0 0 0 0 0  PHQ-9 Score 2 2 4 4  0  Difficult doing work/chores Not difficult at all Not difficult at all Somewhat difficult Somewhat difficult Not difficult at all      10/19/2023   11:25 AM 10/04/2023    3:13 PM 04/18/2023   11:15 AM 03/07/2023   12:02 PM  GAD 7 : Generalized Anxiety Score  Nervous, Anxious, on Edge 1 1 2 2   Control/stop worrying 0 0 1 1  Worry too much - different things 0 1 2 2   Trouble relaxing 0 0 1 1  Restless 0 0 1 1  Easily annoyed or irritable 1 1 2 2   Afraid - awful might happen 0 0 2 2  Total GAD 7 Score 2 3 11 11   Anxiety Difficulty Not difficult at all  Somewhat difficult Very difficult Very difficult        Relevant past medical, surgical, family, and social history reviewed and updated as indicated.  Allergies and medications reviewed and updated. Data reviewed: Chart in Epic.   Past Medical History:  Diagnosis Date   Anxiety    Aphasia    Arthritis    COPD (chronic obstructive pulmonary disease) (HCC)    CRPS (complex regional pain syndrome type I)    DDD (degenerative disc disease), cervical    Degenerative disc disease at L5-S1 level    Fibromyalgia    GERD (gastroesophageal reflux disease)    IBS (irritable bowel syndrome)    Migraines    Moody 01/22/2015   Psoriasis    Scoliosis    Spondylolysis     Past Surgical History:  Procedure Laterality Date   ABDOMINAL HYSTERECTOMY     BIOPSY  09/14/2016    Procedure: BIOPSY;  Surgeon: Corbin Ade, MD;  Location: [facility];  Service: Endoscopy;;  gastric    CARDIAC CATHETERIZATION  2015   patient reported it to be normal. "I was dying in pain during procedure".    CARPAL TUNNEL RELEASE Right    CESAREAN SECTION     CHOLECYSTECTOMY     COLONOSCOPY  06/2014   Vernia Buff DeMason: normal   DILATION AND CURETTAGE OF UTERUS     ESOPHAGOGASTRODUODENOSCOPY  07/2014   Erskine Speed: Normal   ESOPHAGOGASTRODUODENOSCOPY (EGD) WITH PROPOFOL N/A 09/14/2016   Dr. Jena Gauss: Normal esophagus status post empiric dilation, erosive gastropathy with biopsies showing chronic inactive gastritis, no H pylori.   EXTERNAL FIXATION OF FINGER Left 12/01/2021   Procedure: LEFT RING DIGIT WIDGET PLACEMENT;  Surgeon: Betha Loa, MD;  Location: [facility];  Service: Orthopedics;  Laterality: Left;   FOOT SURGERY Bilateral    removal of heel spurs   MALONEY DILATION  09/14/2016   Procedure: MALONEY DILATION;  Surgeon: Corbin Ade, MD;  Location: [facility];  Service: Endoscopy;;   rotator cuff surgery Right    TONSILLECTOMY      Social History   Socioeconomic History   Marital status: Legally Separated    Spouse name: Not on file   Number of children: 2   Years of education: GED   Highest education level: GED or equivalent  Occupational History   Occupation: Disabled  Tobacco Use   Smoking status: Every Day    Current packs/day: 1.50    Average packs/day: 1.5 packs/day for 30.0 years (45.0 ttl pk-yrs)    Types: Cigarettes   Smokeless tobacco: Never  Vaping Use   Vaping status: Former  Substance and Sexual Activity   Alcohol use: No    Comment: rarely   Drug use: Yes    Types: Marijuana   Sexual activity: Not Currently    Birth control/protection: Surgical    Comment: hyst  Other Topics Concern   Not on file  Social History Narrative   Caffeine use: Drinks coffee (2 cups per day), tea/soda- 2-3 cups per day   Right handed   Lives  alone.   Lost oldest sister 07/2021 due to cardiac issues. Youngest sister died 08-29-2021 due to unknown causes.    Social Drivers of Corporate investment banker Strain: Low Risk  (09/24/2023)   Overall Financial Resource Strain (CARDIA)    Difficulty of Paying Living Expenses: Not very hard  Food Insecurity: No Food Insecurity (09/24/2023)   Hunger Vital Sign    Worried About Running  Out of Food in the Last Year: Never true    Ran Out of Food in the Last Year: Never true  Transportation Needs: No Transportation Needs (09/24/2023)   PRAPARE - Administrator, Civil Service (Medical): No    Lack of Transportation (Non-Medical): No  Physical Activity: Inactive (09/24/2023)   Exercise Vital Sign    Days of Exercise per Week: 0 days    Minutes of Exercise per Session: 30 min  Stress: No Stress Concern Present (09/24/2023)   [employer] of Occupational Health - Occupational Stress Questionnaire    Feeling of Stress : Only a little  Social Connections: Socially Isolated (09/24/2023)   Social Connection and Isolation Panel [NHANES]    Frequency of Communication with Friends and Family: More than three times a week    Frequency of Social Gatherings with Friends and Family: Once a week    Attends Religious Services: Never    Database administrator or Organizations: No    Attends Banker Meetings: Never    Marital Status: Separated  Intimate Partner Violence: Not At Risk (01/10/2023)   Humiliation, Afraid, Rape, and Kick questionnaire    Fear of Current or Ex-Partner: No    Emotionally Abused: No    Physically Abused: No    Sexually Abused: No    Outpatient Encounter Medications as of 10/19/2023  Medication Sig   albuterol (VENTOLIN HFA) 108 (90 Base) MCG/ACT inhaler USE 2 PUFFS BY MOUTH EVERY 4 HOURS AS NEEDED FOR WHEEZING OR SHORTNESS OF BREATH   amitriptyline (ELAVIL) 25 MG tablet Two po qHS   Budeson-Glycopyrrol-Formoterol (BREZTRI AEROSPHERE) 160-9-4.8  MCG/ACT AERO Inhale 2 puffs into the lungs 2 (two) times daily.   celecoxib (CELEBREX) 200 MG capsule one po qd   cetirizine (ZYRTEC) 10 MG tablet Take 1 tablet (10 mg total) by mouth daily.   COSENTYX SENSOREADY, 300 MG, 150 MG/ML SOAJ    fish oil-omega-3 fatty acids 1000 MG capsule Take 2 capsules (2 g total) by mouth daily.   fluticasone (FLONASE) 50 MCG/ACT nasal spray Administer 2 sprays in each nostril daily.   Fremanezumab-vfrm (AJOVY) 225 MG/1.5ML SOSY INJECT 1 SYRINGE UNDER THE SKIN EVERY 28 DAYS.   gabapentin (NEURONTIN) 800 MG tablet Take 1 tablet (800 mg total) by mouth 4 (four) times daily. (Needs to be seen before next refill)   hydrOXYzine (VISTARIL) 50 MG capsule Take 1 capsule (50 mg total) by mouth 3 (three) times daily as needed.   ipratropium-albuterol (DUONEB) 0.5-2.5 (3) MG/3ML SOLN INHALE 3 ML (CONTENTS OF 1 VIAL) BY NEBULIZER EVERY 6 HOURS AS NEEDED   lidocaine (LIDODERM) 5 % Place 1 patch onto the skin daily. Remove & Discard patch within 12 hours or as directed by MD   moxifloxacin (AVELOX) 400 MG tablet Take 1 tablet (400 mg total) by mouth daily.   nystatin (MYCOSTATIN) 100000 UNIT/ML suspension Take 5 mLs (500,000 Units total) by mouth 4 (four) times daily. For 14 days   nystatin ointment (MYCOSTATIN) Apply 1 application topically 2 (two) times daily as needed.   ondansetron (ZOFRAN-ODT) 8 MG disintegrating tablet Take 1 tablet (8 mg total) by mouth every 8 (eight) hours as needed for nausea or vomiting.   Semaglutide-Weight Management 0.25 MG/0.5ML SOAJ Inject 0.25 mg into the skin once a week for 28 days.   [START ON 11/17/2023] Semaglutide-Weight Management 0.5 MG/0.5ML SOAJ Inject 0.5 mg into the skin once a week for 28 days.   [START ON 12/16/2023] Semaglutide-Weight Management  1 MG/0.5ML SOAJ Inject 1 mg into the skin once a week for 28 days.   [START ON 01/14/2024] Semaglutide-Weight Management 1.7 MG/0.75ML SOAJ Inject 1.7 mg into the skin once a week for 28 days.    [START ON 02/12/2024] Semaglutide-Weight Management 2.4 MG/0.75ML SOAJ Inject 2.4 mg into the skin once a week for 28 days.   triamcinolone cream (KENALOG) 0.1 %    triamcinolone ointment (KENALOG) 0.5 % Apply topically 2 (two) times daily.   [DISCONTINUED] hydrOXYzine (VISTARIL) 25 MG capsule TAKE 2 CAPSULES (50 MG TOTAL) BY MOUTH EVERY 8 (EIGHT) HOURS AS NEEDED FOR ANXIETY.   [DISCONTINUED] omeprazole (PRILOSEC) 40 MG capsule TAKE 1 CAPSULE BY MOUTH EVERY DAY   omeprazole (PRILOSEC) 40 MG capsule TAKE 1 CAPSULE BY MOUTH EVERY DAY   No facility-administered encounter medications on file as of 10/19/2023.    Allergies  Allergen Reactions   Rosuvastatin Calcium Swelling    Ends up in the hospital   Statins Swelling    Ends up in the hospital   Wellbutrin [Bupropion]     suicidal   Hctz [Hydrochlorothiazide] Nausea And Vomiting and Rash   Hydrocodone-Acetaminophen Rash   Oxycodone Rash   Penicillins Nausea And Vomiting and Rash    Has patient had a PCN reaction causing immediate rash, facial/tongue/throat swelling, SOB or lightheadedness with hypotension: Yes Has patient had a PCN reaction causing severe rash involving mucus membranes or skin necrosis: No Has patient had a PCN reaction that required hospitalization No Has patient had a PCN reaction occurring within the last 10 years: No If all of the above answers are "NO", then may proceed with Cephalosporin use.     Pertinent ROS per HPI, otherwise unremarkable      Objective:  BP 130/81   Pulse 89   Temp 97.8 F (36.6 C)   Ht 5\' 2"  (1.575 m)   Wt 201 lb 6.4 oz (91.4 kg)   SpO2 96%   BMI 36.84 kg/m    Wt Readings from Last 3 Encounters:  10/19/23 201 lb 6.4 oz (91.4 kg)  10/04/23 195 lb (88.5 kg)  06/18/23 195 lb (88.5 kg)    Physical Exam Vitals and nursing note reviewed.  Constitutional:      General: She is not in acute distress.    Appearance: Normal appearance. She is morbidly obese. She is not  ill-appearing, toxic-appearing or diaphoretic.  HENT:     Head: Normocephalic and atraumatic.     Nose: Nose normal.     Mouth/Throat:     Mouth: Mucous membranes are moist.  Eyes:     Conjunctiva/sclera: Conjunctivae normal.     Pupils: Pupils are equal, round, and reactive to light.  Neck:     Thyroid: No thyroid mass, thyromegaly or thyroid tenderness.  Cardiovascular:     Rate and Rhythm: Normal rate and regular rhythm.     Heart sounds: Normal heart sounds.  Pulmonary:     Effort: Pulmonary effort is normal.     Breath sounds: Normal breath sounds.  Musculoskeletal:     Cervical back: Neck supple.     Right lower leg: No edema.     Left lower leg: No edema.  Skin:    General: Skin is warm and dry.     Capillary Refill: Capillary refill takes less than 2 seconds.  Neurological:     General: No focal deficit present.     Mental Status: She is alert and oriented to person, place, and time.  Psychiatric:        Mood and Affect: Mood normal.        Behavior: Behavior normal. Behavior is cooperative.        Thought Content: Thought content normal.        Judgment: Judgment normal.      Results for orders placed or performed in visit on 03/07/23  Bayer DCA Hb A1c Waived   Collection Time: 03/07/23 12:00 AM  Result Value Ref Range   HB A1C (BAYER DCA - WAIVED) 5.4 4.8 - 5.6 %  CMP14+EGFR   Collection Time: 03/07/23 12:48 PM  Result Value Ref Range   Glucose 128 (H) 70 - 99 mg/dL   BUN 6 6 - 24 mg/dL   Creatinine, Ser 0.98 0.57 - 1.00 mg/dL   eGFR 119 >14 NW/GNF/6.21   BUN/Creatinine Ratio 9 9 - 23   Sodium 142 134 - 144 mmol/L   Potassium 4.2 3.5 - 5.2 mmol/L   Chloride 103 96 - 106 mmol/L   CO2 23 20 - 29 mmol/L   Calcium 9.4 8.7 - 10.2 mg/dL   Total Protein 6.4 6.0 - 8.5 g/dL   Albumin 4.4 3.8 - 4.9 g/dL   Globulin, Total 2.0 1.5 - 4.5 g/dL   Albumin/Globulin Ratio 2.2    Bilirubin Total 0.4 0.0 - 1.2 mg/dL   Alkaline Phosphatase 114 44 - 121 IU/L   AST 13 0  - 40 IU/L   ALT 12 0 - 32 IU/L  CBC with Differential/Platelet   Collection Time: 03/07/23 12:48 PM  Result Value Ref Range   WBC 5.7 3.4 - 10.8 x10E3/uL   RBC 5.24 3.77 - 5.28 x10E6/uL   Hemoglobin 16.6 (H) 11.1 - 15.9 g/dL   Hematocrit 30.8 (H) 65.7 - 46.6 %   MCV 96 79 - 97 fL   MCH 31.7 26.6 - 33.0 pg   MCHC 32.9 31.5 - 35.7 g/dL   RDW 84.6 96.2 - 95.2 %   Platelets 231 150 - 450 x10E3/uL   Neutrophils 54 Not Estab. %   Lymphs 36 Not Estab. %   Monocytes 4 Not Estab. %   Eos 5 Not Estab. %   Basos 1 Not Estab. %   Neutrophils Absolute 3.1 1.4 - 7.0 x10E3/uL   Lymphocytes Absolute 2.0 0.7 - 3.1 x10E3/uL   Monocytes Absolute 0.3 0.1 - 0.9 x10E3/uL   EOS (ABSOLUTE) 0.3 0.0 - 0.4 x10E3/uL   Basophils Absolute 0.0 0.0 - 0.2 x10E3/uL   Immature Granulocytes 0 Not Estab. %   Immature Grans (Abs) 0.0 0.0 - 0.1 x10E3/uL  Lipid panel   Collection Time: 03/07/23 12:48 PM  Result Value Ref Range   Cholesterol, Total 235 (H) 100 - 199 mg/dL   Triglycerides 841 0 - 149 mg/dL   HDL 61 >32 mg/dL   VLDL Cholesterol Cal 22 5 - 40 mg/dL   LDL Chol Calc (NIH) 440 (H) 0 - 99 mg/dL   Chol/HDL Ratio 3.9 0.0 - 4.4 ratio  Thyroid Panel With TSH   Collection Time: 03/07/23 12:48 PM  Result Value Ref Range   TSH 1.060 0.450 - 4.500 uIU/mL   T4, Total 7.8 4.5 - 12.0 ug/dL   T3 Uptake Ratio 25 24 - 39 %   Free Thyroxine Index 2.0 1.2 - 4.9  VITAMIN D 25 Hydroxy (Vit-D Deficiency, Fractures)   Collection Time: 03/07/23 12:48 PM  Result Value Ref Range   Vit D, 25-Hydroxy 26.4 (L) 30.0 - 100.0 ng/mL  Pertinent labs & imaging results that were available during my care of the patient were reviewed by me and considered in my medical decision making.  Assessment & Plan:  Milka was seen today for anxiety.  Diagnoses and all orders for this visit:  Anxiety -     hydrOXYzine (VISTARIL) 50 MG capsule; Take 1 capsule (50 mg total) by mouth 3 (three) times daily as  needed.  Gastroesophageal reflux disease, unspecified whether esophagitis present -     omeprazole (PRILOSEC) 40 MG capsule; TAKE 1 CAPSULE BY MOUTH EVERY DAY -     CBC with Differential/Platelet  Vitamin D deficiency -     CMP14+EGFR -     VITAMIN D 25 Hydroxy (Vit-D Deficiency, Fractures)  Morbid obesity (HCC) -     Semaglutide-Weight Management 0.25 MG/0.5ML SOAJ; Inject 0.25 mg into the skin once a week for 28 days. -     Semaglutide-Weight Management 0.5 MG/0.5ML SOAJ; Inject 0.5 mg into the skin once a week for 28 days. -     Semaglutide-Weight Management 1 MG/0.5ML SOAJ; Inject 1 mg into the skin once a week for 28 days. -     Semaglutide-Weight Management 1.7 MG/0.75ML SOAJ; Inject 1.7 mg into the skin once a week for 28 days. -     Semaglutide-Weight Management 2.4 MG/0.75ML SOAJ; Inject 2.4 mg into the skin once a week for 28 days. -     CBC with Differential/Platelet -     CMP14+EGFR -     Lipid panel -     Thyroid Panel With TSH -     VITAMIN D 25 Hydroxy (Vit-D Deficiency, Fractures)  Mixed hyperlipidemia -     Semaglutide-Weight Management 0.25 MG/0.5ML SOAJ; Inject 0.25 mg into the skin once a week for 28 days. -     Semaglutide-Weight Management 0.5 MG/0.5ML SOAJ; Inject 0.5 mg into the skin once a week for 28 days. -     Semaglutide-Weight Management 1 MG/0.5ML SOAJ; Inject 1 mg into the skin once a week for 28 days. -     Semaglutide-Weight Management 1.7 MG/0.75ML SOAJ; Inject 1.7 mg into the skin once a week for 28 days. -     Semaglutide-Weight Management 2.4 MG/0.75ML SOAJ; Inject 2.4 mg into the skin once a week for 28 days. -     CMP14+EGFR -     Lipid panel  Primary hypertension -     Semaglutide-Weight Management 0.25 MG/0.5ML SOAJ; Inject 0.25 mg into the skin once a week for 28 days. -     Semaglutide-Weight Management 0.5 MG/0.5ML SOAJ; Inject 0.5 mg into the skin once a week for 28 days. -     Semaglutide-Weight Management 1 MG/0.5ML SOAJ; Inject 1 mg  into the skin once a week for 28 days. -     Semaglutide-Weight Management 1.7 MG/0.75ML SOAJ; Inject 1.7 mg into the skin once a week for 28 days. -     Semaglutide-Weight Management 2.4 MG/0.75ML SOAJ; Inject 2.4 mg into the skin once a week for 28 days. -     CBC with Differential/Platelet -     CMP14+EGFR -     Thyroid Panel With TSH     Assessment and Plan    Generalized Anxiety Disorder Increased frequency and severity of anxiety symptoms, requiring hydroxyzine twice daily or more. Previous trials of Buspar and other medications resulted in suicidal ideation. Prefers to avoid daily medications like SSRIs or SNRIs. Decision made to increase hydroxyzine dosage. - Increase hydroxyzine  to 50 mg capsules, up to three times a day as needed - Monitor symptoms and consider daily medication if anxiety worsens  Obesity Gained approximately 20 pounds since summer due to stress eating and lack of exercise. Switched to [employer], which may [employer] with prior authorization. Advised on mitigating side effects with adequate water intake, small frequent meals, and staying active. - Submit prior authorization for [employer] - Order baseline blood work to check kidney and liver function - Provide guidelines to mitigate side effects of GLP-1 therapy, including high protein diet, adequate water intake, small frequent meals, and staying active - Schedule follow-up in 8 weeks for BMI check  Gastroesophageal Reflux Disease (GERD) Good control of reflux symptoms with omeprazole. - Continue omeprazole as prescribed  Vitamin D Deficiency Taking vitamin D supplements with reported improvement in joint pain. - Continue vitamin D supplementation - Recheck vitamin D levels at next visit  General Health Maintenance Due for a physical exam in six months, including comprehensive blood work to monitor cholesterol and other health parameters. - Schedule physical exam in six months - Perform  comprehensive blood work at the time of physical exam  Follow-up - Schedule follow-up appointment in 8 weeks for BMI check - Schedule physical exam in 6 months - Perform lab work on the way out.          Continue all other maintenance medications.  Follow up plan: Return for 8 weeks BMI, 6 months CPE.   Continue healthy lifestyle choices, including diet (rich in fruits, vegetables, and lean proteins, and low in salt and simple carbohydrates) and exercise (at least 30 minutes of moderate physical activity daily).  Educational handout given for Vit D deficiency, GLP1 success   The above assessment and management plan was discussed with the patient. The patient verbalized understanding of and has agreed to the management plan. Patient is aware to call the clinic if they develop any new symptoms or if symptoms persist or worsen. Patient is aware when to return to the clinic for a follow-up visit. Patient educated on when it is appropriate to go to the emergency department.   Kari Baars, FNP-C [facility] [phone]

## 2023-10-20 LAB — CBC WITH DIFFERENTIAL/PLATELET
Basophils Absolute: 0 10*3/uL (ref 0.0–0.2)
Basos: 0 %
EOS (ABSOLUTE): 0.4 10*3/uL (ref 0.0–0.4)
Eos: 6 %
Hematocrit: 47.6 % — ABNORMAL HIGH (ref 34.0–46.6)
Hemoglobin: 15.5 g/dL (ref 11.1–15.9)
Immature Grans (Abs): 0 10*3/uL (ref 0.0–0.1)
Immature Granulocytes: 0 %
Lymphocytes Absolute: 2.1 10*3/uL (ref 0.7–3.1)
Lymphs: 31 %
MCH: 31.6 pg (ref 26.6–33.0)
MCHC: 32.6 g/dL (ref 31.5–35.7)
MCV: 97 fL (ref 79–97)
Monocytes Absolute: 0.3 10*3/uL (ref 0.1–0.9)
Monocytes: 4 %
Neutrophils Absolute: 3.9 10*3/uL (ref 1.4–7.0)
Neutrophils: 59 %
Platelets: 229 10*3/uL (ref 150–450)
RBC: 4.9 x10E6/uL (ref 3.77–5.28)
RDW: 12.5 % (ref 11.7–15.4)
WBC: 6.8 10*3/uL (ref 3.4–10.8)

## 2023-10-20 LAB — LIPID PANEL
Cholesterol, Total: 246 mg/dL — ABNORMAL HIGH (ref 100–199)
HDL: 69 mg/dL (ref >39)
LDL CALC COMMENT:: 3.6 ratio (ref 0.0–4.4)
LDL Chol Calc (NIH): 156 mg/dL — ABNORMAL HIGH (ref 0–99)
Triglycerides: 120 mg/dL (ref 0–149)
VLDL Cholesterol Cal: 21 mg/dL (ref 5–40)

## 2023-10-20 LAB — CMP14+EGFR
ALT: 13 IU/L (ref 0–32)
AST: 14 IU/L (ref 0–40)
Albumin: 4.2 g/dL (ref 3.8–4.9)
Alkaline Phosphatase: 100 IU/L (ref 44–121)
BUN/Creatinine Ratio: 6 — ABNORMAL LOW (ref 9–23)
BUN: 5 mg/dL — ABNORMAL LOW (ref 6–24)
Bilirubin Total: 0.4 mg/dL (ref 0.0–1.2)
CO2: 21 mmol/L (ref 20–29)
Calcium: 8.8 mg/dL (ref 8.7–10.2)
Chloride: 102 mmol/L (ref 96–106)
Creatinine, Ser: 0.82 mg/dL (ref 0.57–1.00)
Globulin, Total: 2 g/dL (ref 1.5–4.5)
Glucose: 133 mg/dL — ABNORMAL HIGH (ref 70–99)
Potassium: 4 mmol/L (ref 3.5–5.2)
Sodium: 138 mmol/L (ref 134–144)
Total Protein: 6.2 g/dL (ref 6.0–8.5)
eGFR: 87 mL/min/{1.73_m2} (ref >59)

## 2023-10-20 LAB — THYROID PANEL WITH TSH
Free Thyroxine Index: 2 (ref 1.2–4.9)
T3 Uptake Ratio: 26 % (ref 24–39)
T4, Total: 7.7 ug/dL (ref 4.5–12.0)
TSH: 1.36 u[IU]/mL (ref 0.450–4.500)

## 2023-10-20 LAB — VITAMIN D 25 HYDROXY (VIT D DEFICIENCY, FRACTURES): Vit D, 25-Hydroxy: 22.4 ng/mL — ABNORMAL LOW (ref 30.0–100.0)

## 2023-10-22 ENCOUNTER — Encounter: Payer: Self-pay | Admitting: Family Medicine

## 2023-10-31 ENCOUNTER — Encounter: Payer: Self-pay | Admitting: Acute Care

## 2023-11-28 ENCOUNTER — Other Ambulatory Visit: Payer: Self-pay | Admitting: Neurology

## 2023-12-07 ENCOUNTER — Other Ambulatory Visit: Payer: Self-pay | Admitting: Neurology

## 2023-12-07 DIAGNOSIS — M79662 Pain in left lower leg: Secondary | ICD-10-CM

## 2023-12-14 ENCOUNTER — Ambulatory Visit: Payer: 59 | Admitting: Family Medicine

## 2023-12-14 ENCOUNTER — Ambulatory Visit: Admitting: Family Medicine

## 2023-12-14 ENCOUNTER — Encounter: Payer: Self-pay | Admitting: Family Medicine

## 2023-12-17 ENCOUNTER — Telehealth: Payer: Self-pay

## 2023-12-17 NOTE — Telephone Encounter (Signed)
 Pharmacy Patient Advocate Encounter   Received notification from Fax that prior authorization for AJOVY (fremanezumab-vfrm) injection 225MG /1.5ML syringes is required/requested.   Insurance verification completed.   The patient is insured through [facility] .   Per test claim: PA required; PA submitted to above mentioned insurance via CoverMyMeds Key/confirmation #/EOC [id number] Status is pending

## 2023-12-19 ENCOUNTER — Telehealth: Payer: Self-pay | Admitting: Neurology

## 2023-12-19 NOTE — Telephone Encounter (Signed)
 [facility] confirmation

## 2023-12-19 NOTE — Telephone Encounter (Signed)
 Dr. Epimenio Foot- she has hx IBS, not sure Aimovig a good option d/t this? Other option is Qulipta. Any reason she cannot try this?   Ajovy denied stating she has to try/fail there 2 first before covering Ajovy

## 2023-12-19 NOTE — Telephone Encounter (Signed)
 Pharmacy Patient Advocate Encounter  Received notification from [facility] that Prior Authorization for AJOVY (fremanezumab-vfrm) injection 225MG /1.5ML syringes has been DENIED.  Full denial letter will be uploaded to the media tab. See denial reason below.   PA #/Case ID/Reference #: PA Case ID #: [id number]

## 2023-12-20 ENCOUNTER — Telehealth: Payer: Self-pay | Admitting: Neurology

## 2023-12-20 ENCOUNTER — Encounter: Payer: Self-pay | Admitting: Neurology

## 2023-12-20 ENCOUNTER — Ambulatory Visit: Payer: Medicare HMO | Admitting: Neurology

## 2023-12-20 VITALS — BP 104/67 | HR 88 | Ht 62.0 in | Wt 203.0 lb

## 2023-12-20 DIAGNOSIS — G43709 Chronic migraine without aura, not intractable, without status migrainosus: Secondary | ICD-10-CM

## 2023-12-20 DIAGNOSIS — M7062 Trochanteric bursitis, left hip: Secondary | ICD-10-CM | POA: Diagnosis not present

## 2023-12-20 DIAGNOSIS — M542 Cervicalgia: Secondary | ICD-10-CM | POA: Diagnosis not present

## 2023-12-20 DIAGNOSIS — M5481 Occipital neuralgia: Secondary | ICD-10-CM | POA: Diagnosis not present

## 2023-12-20 DIAGNOSIS — M797 Fibromyalgia: Secondary | ICD-10-CM | POA: Diagnosis not present

## 2023-12-20 DIAGNOSIS — G44229 Chronic tension-type headache, not intractable: Secondary | ICD-10-CM | POA: Diagnosis not present

## 2023-12-20 DIAGNOSIS — M7061 Trochanteric bursitis, right hip: Secondary | ICD-10-CM

## 2023-12-20 MED ORDER — QULIPTA 30 MG PO TABS: 30.0000 mg | ORAL_TABLET | Freq: Every day | ORAL | 11 refills | Status: DC

## 2023-12-20 MED ORDER — BUPIVACAINE HCL 0.5 % IJ SOLN
4.0000 mL | Freq: Once | INTRAMUSCULAR | Status: AC
  Administered 2023-12-20: 4 mL

## 2023-12-20 MED ORDER — METHYLPREDNISOLONE ACETATE 80 MG/ML IJ SUSP
80.0000 mg | Freq: Once | INTRAMUSCULAR | Status: AC
  Administered 2023-12-20: 80 mg via INTRAMUSCULAR
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
## 2023-12-20 NOTE — Telephone Encounter (Signed)
 Called patient and informed pt both Rx sent on 12/10/23 with additional refills.

## 2023-12-20 NOTE — Telephone Encounter (Signed)
 Dr. Epimenio Foot- Pt coming for appt today at 11:30 to see you. Did you want to discuss with her then and decide plan?

## 2023-12-20 NOTE — Telephone Encounter (Signed)
 Pt wanted to make sure she'd be getting refills on her amitriptyline and gabapentin, wants sent to [facility] in [place].

## 2023-12-24 ENCOUNTER — Telehealth: Payer: Self-pay

## 2023-12-24 ENCOUNTER — Other Ambulatory Visit (HOSPITAL_COMMUNITY): Payer: Self-pay

## 2023-12-24 NOTE — Telephone Encounter (Signed)
 Pharmacy Patient Advocate Encounter  Received notification from [facility] that Prior Authorization for Qulipta 30MG  tablets has been APPROVED from 12/24/2023 to 09/24/2024. Unable to obtain price due to refill too soon rejection, last fill date 12/20/2023 next available fill date4/19/2025   PA #/Case ID/Reference #: PA Case ID #: [id number]

## 2023-12-24 NOTE — Telephone Encounter (Signed)
 Pharmacy Patient Advocate Encounter   Received notification from Fax that prior authorization for Qulipta 30MG  tablets is required/requested.   Insurance verification completed.   The patient is insured through [facility] .   Per test claim: PA required; PA submitted to above mentioned insurance via CoverMyMeds Key/confirmation #/EOC [id number] Status is pending

## 2023-12-27 ENCOUNTER — Encounter: Payer: Self-pay | Admitting: Family Medicine

## 2023-12-27 ENCOUNTER — Other Ambulatory Visit: Payer: Self-pay | Admitting: Family Medicine

## 2023-12-27 ENCOUNTER — Telehealth: Payer: Self-pay | Admitting: Family Medicine

## 2023-12-27 ENCOUNTER — Ambulatory Visit: Admitting: Family Medicine

## 2023-12-27 ENCOUNTER — Other Ambulatory Visit (HOSPITAL_COMMUNITY): Payer: Self-pay

## 2023-12-27 ENCOUNTER — Telehealth: Payer: Self-pay

## 2023-12-27 VITALS — BP 138/90 | HR 99 | Temp 97.9°F | Ht 62.0 in | Wt 201.2 lb

## 2023-12-27 DIAGNOSIS — R7303 Prediabetes: Secondary | ICD-10-CM

## 2023-12-27 DIAGNOSIS — R5383 Other fatigue: Secondary | ICD-10-CM | POA: Diagnosis not present

## 2023-12-27 DIAGNOSIS — Z6841 Body Mass Index (BMI) 40.0 and over, adult: Secondary | ICD-10-CM

## 2023-12-27 DIAGNOSIS — I10 Essential (primary) hypertension: Secondary | ICD-10-CM

## 2023-12-27 DIAGNOSIS — E782 Mixed hyperlipidemia: Secondary | ICD-10-CM

## 2023-12-27 DIAGNOSIS — J302 Other seasonal allergic rhinitis: Secondary | ICD-10-CM

## 2023-12-27 DIAGNOSIS — R6889 Other general symptoms and signs: Secondary | ICD-10-CM | POA: Diagnosis not present

## 2023-12-27 DIAGNOSIS — R5381 Other malaise: Secondary | ICD-10-CM

## 2023-12-27 MED ORDER — SEMAGLUTIDE-WEIGHT MANAGEMENT 0.25 MG/0.5ML ~~LOC~~ SOAJ: 0.2500 mg | SUBCUTANEOUS | 0 refills | Status: DC

## 2023-12-27 MED ORDER — LISINOPRIL 5 MG PO TABS: 5.0000 mg | ORAL_TABLET | Freq: Every day | ORAL | 3 refills | Status: DC

## 2023-12-27 NOTE — Telephone Encounter (Signed)
 WEGOVY 0.25 MG/0.5ML SOAJ   Pharmacy comment: Alternative Requested:DRUG NOT COVERED; PLEASE CONTACT INSURANCE OR TRY TO GET APPROVED OR CONSIDER ALTERNATIVE

## 2023-12-27 NOTE — Telephone Encounter (Signed)
 Pharmacy Patient Advocate Encounter   Received notification from Pt Calls Messages that prior authorization for Wegovy 0.25MG /0.5ML auto-injectors is required/requested.   Insurance verification completed.   The patient is insured through [facility] .   Per test claim: PA required; PA submitted to above mentioned insurance via CoverMyMeds Key/confirmation #/EOC [id number] Status is pending

## 2023-12-27 NOTE — Progress Notes (Signed)
 Subjective:  Patient ID: Judy Evans, female    DOB: 04-11-1971, 53 y.o.   MRN: [id number]  Patient Care Team: Sonny Masters, FNP as PCP - General (Family Medicine) Rollene Rotunda, MD as PCP - Cardiology (Cardiology) Jena Gauss Gerrit Friends, MD as Consulting Physician (Gastroenterology) Marcelino Duster, MD as Referring Physician (Dermatology) Jozey Janco, Doralee Albino, FNP as Nurse Practitioner (Family Medicine)   Chief Complaint:  BMI (8 week follow up ) and Fatigue (X 1 week )   HPI: Judy Evans is a 53 y.o. female presenting on 12/27/2023 for BMI (8 week follow up ) and Fatigue (X 1 week )   History of Present Illness   Judy Evans is a 53 year old female who presents with generalized malaise and fatigue.  She experiences generalized malaise and fatigue, describing symptoms of tiredness, weakness, and diffuse body pain. She feels 'really tired' and 'hurting all over.' No recent illnesses, significant weight changes, or abnormal bleeding or bruising beyond her usual. Occasional night sweats and gradual weight gain are noted, with a slight recent weight loss. No changes in bowel habits.  She has difficulty walking due to hip pain, for which she received injections from her neurologist last week. She also receives regular injections in the back of her head and neck every four months.  She mentions significant stress and occasional sensations involving her heart. Her blood pressure has been elevated, but she is not on any medication for blood pressure control. Eating habits are variable, with periods of eating very little and other times overeating, described as 'cramming.'  She experiences possible sinus-related symptoms, attributed to seasonal allergies. She uses over-the-counter Allegra and occasionally Flonase for allergy management. Previously on cetirizine, she switched to over-the-counter options several months ago.         10/19/2023   11:25 AM 10/04/2023    3:13 PM  04/18/2023   11:14 AM 03/07/2023   12:02 PM 01/10/2023    1:55 PM  Depression screen PHQ 2/9  Decreased Interest 1 0 1 1 0  Down, Depressed, Hopeless 0 0 0 0 0  PHQ - 2 Score 1 0 1 1 0  Altered sleeping 0 0 0 0 0  Tired, decreased energy 0 1 1 1  0  Change in appetite 1 1 0 0 0  Feeling bad or failure about yourself  0 0 0 0 0  Trouble concentrating 0 0 1 1 0  Moving slowly or fidgety/restless 0 0 1 1 0  Suicidal thoughts 0 0 0 0 0  PHQ-9 Score 2 2 4 4  0  Difficult doing work/chores Not difficult at all Not difficult at all Somewhat difficult Somewhat difficult Not difficult at all      10/19/2023   11:25 AM 10/04/2023    3:13 PM 04/18/2023   11:15 AM 03/07/2023   12:02 PM  GAD 7 : Generalized Anxiety Score  Nervous, Anxious, on Edge 1 1 2 2   Control/stop worrying 0 0 1 1  Worry too much - different things 0 1 2 2   Trouble relaxing 0 0 1 1  Restless 0 0 1 1  Easily annoyed or irritable 1 1 2 2   Afraid - awful might happen 0 0 2 2  Total GAD 7 Score 2 3 11 11   Anxiety Difficulty Not difficult at all Somewhat difficult Very difficult Very difficult        Relevant past medical, surgical, family, and social history reviewed and updated as indicated.  Allergies and medications reviewed and updated. Data reviewed: Chart in Epic.   Past Medical History:  Diagnosis Date   Anxiety    Aphasia    Arthritis    COPD (chronic obstructive pulmonary disease) (HCC)    CRPS (complex regional pain syndrome type I)    DDD (degenerative disc disease), cervical    Degenerative disc disease at L5-S1 level    Fibromyalgia    GERD (gastroesophageal reflux disease)    IBS (irritable bowel syndrome)    Migraines    Moody 01/22/2015   Psoriasis    Scoliosis    Spondylolysis     Past Surgical History:  Procedure Laterality Date   ABDOMINAL HYSTERECTOMY     BIOPSY  09/14/2016   Procedure: BIOPSY;  Surgeon: Corbin Ade, MD;  Location: [facility];  Service: Endoscopy;;  gastric     CARDIAC CATHETERIZATION  2015   patient reported it to be normal. "I was dying in pain during procedure".    CARPAL TUNNEL RELEASE Right    CESAREAN SECTION     CHOLECYSTECTOMY     COLONOSCOPY  06/2014   Vernia Buff DeMason: normal   DILATION AND CURETTAGE OF UTERUS     ESOPHAGOGASTRODUODENOSCOPY  07/2014   Erskine Speed: Normal   ESOPHAGOGASTRODUODENOSCOPY (EGD) WITH PROPOFOL N/A 09/14/2016   Dr. Jena Gauss: Normal esophagus status post empiric dilation, erosive gastropathy with biopsies showing chronic inactive gastritis, no H pylori.   EXTERNAL FIXATION OF FINGER Left 12/01/2021   Procedure: LEFT RING DIGIT WIDGET PLACEMENT;  Surgeon: Betha Loa, MD;  Location: [facility];  Service: Orthopedics;  Laterality: Left;   FOOT SURGERY Bilateral    removal of heel spurs   MALONEY DILATION  09/14/2016   Procedure: MALONEY DILATION;  Surgeon: Corbin Ade, MD;  Location: [facility];  Service: Endoscopy;;   rotator cuff surgery Right    TONSILLECTOMY      Social History   Socioeconomic History   Marital status: Legally Separated    Spouse name: Not on file   Number of children: 2   Years of education: GED   Highest education level: GED or equivalent  Occupational History   Occupation: Disabled  Tobacco Use   Smoking status: Every Day    Current packs/day: 1.50    Average packs/day: 1.5 packs/day for 30.0 years (45.0 ttl pk-yrs)    Types: Cigarettes   Smokeless tobacco: Never  Vaping Use   Vaping status: Former  Substance and Sexual Activity   Alcohol use: No    Comment: rarely   Drug use: Yes    Types: Marijuana   Sexual activity: Not Currently    Birth control/protection: Surgical    Comment: hyst  Other Topics Concern   Not on file  Social History Narrative   Caffeine use: Drinks coffee (2 cups per day), tea/soda- 2-3 cups per day   Right handed   Lives alone.   Lost oldest sister 07/2021 due to cardiac issues. Youngest sister died 09/04/21 due to unknown  causes.    Social Drivers of Corporate investment banker Strain: Low Risk  (09/24/2023)   Overall Financial Resource Strain (CARDIA)    Difficulty of Paying Living Expenses: Not very hard  Food Insecurity: No Food Insecurity (09/24/2023)   Hunger Vital Sign    Worried About Running Out of Food in the Last Year: Never true    Ran Out of Food in the Last Year: Never true  Transportation Needs: No Transportation  Needs (09/24/2023)   PRAPARE - Administrator, Civil Service (Medical): No    Lack of Transportation (Non-Medical): No  Physical Activity: Inactive (09/24/2023)   Exercise Vital Sign    Days of Exercise per Week: 0 days    Minutes of Exercise per Session: 30 min  Stress: No Stress Concern Present (09/24/2023)   [employer] of Occupational Health - Occupational Stress Questionnaire    Feeling of Stress : Only a little  Social Connections: Socially Isolated (09/24/2023)   Social Connection and Isolation Panel [NHANES]    Frequency of Communication with Friends and Family: More than three times a week    Frequency of Social Gatherings with Friends and Family: Once a week    Attends Religious Services: Never    Database administrator or Organizations: No    Attends Banker Meetings: Never    Marital Status: Separated  Intimate Partner Violence: Not At Risk (01/10/2023)   Humiliation, Afraid, Rape, and Kick questionnaire    Fear of Current or Ex-Partner: No    Emotionally Abused: No    Physically Abused: No    Sexually Abused: No    Outpatient Encounter Medications as of 12/27/2023  Medication Sig   albuterol (VENTOLIN HFA) 108 (90 Base) MCG/ACT inhaler USE 2 PUFFS BY MOUTH EVERY 4 HOURS AS NEEDED FOR WHEEZING OR SHORTNESS OF BREATH   amitriptyline (ELAVIL) 25 MG tablet TAKE 2 TABLETS BY MOUTH AT BEDTIME   Atogepant (QULIPTA) 30 MG TABS Take 1 tablet (30 mg total) by mouth daily.   Budeson-Glycopyrrol-Formoterol (BREZTRI AEROSPHERE) 160-9-4.8  MCG/ACT AERO Inhale 2 puffs into the lungs 2 (two) times daily.   celecoxib (CELEBREX) 200 MG capsule TAKE 1 CAPSULE BY MOUTH EVERY DAY   cetirizine (ZYRTEC) 10 MG tablet Take 1 tablet (10 mg total) by mouth daily.   COSENTYX SENSOREADY, 300 MG, 150 MG/ML SOAJ    fish oil-omega-3 fatty acids 1000 MG capsule Take 2 capsules (2 g total) by mouth daily.   fluticasone (FLONASE) 50 MCG/ACT nasal spray Administer 2 sprays in each nostril daily.   Fremanezumab-vfrm (AJOVY) 225 MG/1.5ML SOSY INJECT 1 SYRINGE UNDER THE SKIN EVERY 28 DAYS   gabapentin (NEURONTIN) 800 MG tablet TAKE 1 TABLET (800 MG TOTAL) BY MOUTH 4 (FOUR) TIMES DAILY. (NEEDS TO BE SEEN BEFORE NEXT REFILL)   hydrOXYzine (VISTARIL) 50 MG capsule Take 1 capsule (50 mg total) by mouth 3 (three) times daily as needed.   ipratropium-albuterol (DUONEB) 0.5-2.5 (3) MG/3ML SOLN INHALE 3 ML (CONTENTS OF 1 VIAL) BY NEBULIZER EVERY 6 HOURS AS NEEDED   lisinopril (ZESTRIL) 5 MG tablet Take 1 tablet (5 mg total) by mouth daily.   nystatin ointment (MYCOSTATIN) Apply 1 application topically 2 (two) times daily as needed.   omeprazole (PRILOSEC) 40 MG capsule TAKE 1 CAPSULE BY MOUTH EVERY DAY   Semaglutide-Weight Management 0.25 MG/0.5ML SOAJ Inject 0.25 mg into the skin once a week.   triamcinolone cream (KENALOG) 0.1 %    triamcinolone ointment (KENALOG) 0.5 % Apply topically 2 (two) times daily.   [DISCONTINUED] lidocaine (LIDODERM) 5 % Place 1 patch onto the skin daily. Remove & Discard patch within 12 hours or as directed by MD   [DISCONTINUED] moxifloxacin (AVELOX) 400 MG tablet Take 1 tablet (400 mg total) by mouth daily.   [DISCONTINUED] nystatin (MYCOSTATIN) 100000 UNIT/ML suspension Take 5 mLs (500,000 Units total) by mouth 4 (four) times daily. For 14 days   [DISCONTINUED] Semaglutide-Weight Management 1.7 MG/0.75ML  SOAJ Inject 1.7 mg into the skin once a week for 28 days. (Patient not taking: Reported on 12/27/2023)   No facility-administered  encounter medications on file as of 12/27/2023.    Allergies  Allergen Reactions   Rosuvastatin Calcium Swelling    Ends up in the hospital   Statins Swelling    Ends up in the hospital   Wellbutrin [Bupropion]     suicidal   Hctz [Hydrochlorothiazide] Nausea And Vomiting and Rash   Hydrocodone-Acetaminophen Rash   Oxycodone Rash   Penicillins Nausea And Vomiting and Rash    Has patient had a PCN reaction causing immediate rash, facial/tongue/throat swelling, SOB or lightheadedness with hypotension: Yes Has patient had a PCN reaction causing severe rash involving mucus membranes or skin necrosis: No Has patient had a PCN reaction that required hospitalization No Has patient had a PCN reaction occurring within the last 10 years: No If all of the above answers are "NO", then may proceed with Cephalosporin use.     Pertinent ROS per HPI, otherwise unremarkable      Objective:  BP (!) 138/90   Pulse 99   Temp 97.9 F (36.6 C)   Ht 5\' 2"  (1.575 m)   Wt 201 lb 3.2 oz (91.3 kg)   SpO2 92%   BMI 36.80 kg/m    Wt Readings from Last 3 Encounters:  12/27/23 201 lb 3.2 oz (91.3 kg)  12/20/23 203 lb (92.1 kg)  10/19/23 201 lb 6.4 oz (91.4 kg)    Physical Exam Vitals and nursing note reviewed.  Constitutional:      General: She is not in acute distress.    Appearance: Normal appearance. She is morbidly obese. She is not ill-appearing, toxic-appearing or diaphoretic.  HENT:     Head: Normocephalic and atraumatic.     Right Ear: Tympanic membrane, ear canal and external ear normal.     Left Ear: Tympanic membrane, ear canal and external ear normal.     Nose: Nose normal.     Right Turbinates: Enlarged.     Left Turbinates: Enlarged.     Right Sinus: No maxillary sinus tenderness or frontal sinus tenderness.     Left Sinus: No maxillary sinus tenderness or frontal sinus tenderness.     Mouth/Throat:     Lips: Pink.     Mouth: Mucous membranes are moist.     Pharynx: No  oropharyngeal exudate or posterior oropharyngeal erythema.     Comments: Cobblestoning to posterior oropharynx  Eyes:     General: Lids are normal. Allergic shiner present.     Conjunctiva/sclera: Conjunctivae normal.     Pupils: Pupils are equal, round, and reactive to light.  Neck:     Thyroid: No thyroid mass, thyromegaly or thyroid tenderness.  Cardiovascular:     Rate and Rhythm: Normal rate and regular rhythm.     Heart sounds: Normal heart sounds.  Pulmonary:     Effort: Pulmonary effort is normal.     Breath sounds: Normal breath sounds.  Musculoskeletal:     Cervical back: Neck supple.     Right lower leg: No edema.     Left lower leg: No edema.  Lymphadenopathy:     Cervical: No cervical adenopathy.  Skin:    General: Skin is warm and dry.     Capillary Refill: Capillary refill takes less than 2 seconds.  Neurological:     General: No focal deficit present.     Mental Status: She is alert and  oriented to person, place, and time.  Psychiatric:        Mood and Affect: Mood normal.        Behavior: Behavior normal. Behavior is cooperative.        Thought Content: Thought content normal.        Judgment: Judgment normal.    Results for orders placed or performed in visit on 10/19/23  CBC with Differential/Platelet   Collection Time: 10/19/23 11:27 AM  Result Value Ref Range   WBC 6.8 3.4 - 10.8 x10E3/uL   RBC 4.90 3.77 - 5.28 x10E6/uL   Hemoglobin 15.5 11.1 - 15.9 g/dL   Hematocrit 16.1 (H) 09.6 - 46.6 %   MCV 97 79 - 97 fL   MCH 31.6 26.6 - 33.0 pg   MCHC 32.6 31.5 - 35.7 g/dL   RDW 04.5 40.9 - 81.1 %   Platelets 229 150 - 450 x10E3/uL   Neutrophils 59 Not Estab. %   Lymphs 31 Not Estab. %   Monocytes 4 Not Estab. %   Eos 6 Not Estab. %   Basos 0 Not Estab. %   Neutrophils Absolute 3.9 1.4 - 7.0 x10E3/uL   Lymphocytes Absolute 2.1 0.7 - 3.1 x10E3/uL   Monocytes Absolute 0.3 0.1 - 0.9 x10E3/uL   EOS (ABSOLUTE) 0.4 0.0 - 0.4 x10E3/uL   Basophils Absolute  0.0 0.0 - 0.2 x10E3/uL   Immature Granulocytes 0 Not Estab. %   Immature Grans (Abs) 0.0 0.0 - 0.1 x10E3/uL  CMP14+EGFR   Collection Time: 10/19/23 11:27 AM  Result Value Ref Range   Glucose 133 (H) 70 - 99 mg/dL   BUN 5 (L) 6 - 24 mg/dL   Creatinine, Ser 9.14 0.57 - 1.00 mg/dL   eGFR 87 >78 GN/FAO/1.30   BUN/Creatinine Ratio 6 (L) 9 - 23   Sodium 138 134 - 144 mmol/L   Potassium 4.0 3.5 - 5.2 mmol/L   Chloride 102 96 - 106 mmol/L   CO2 21 20 - 29 mmol/L   Calcium 8.8 8.7 - 10.2 mg/dL   Total Protein 6.2 6.0 - 8.5 g/dL   Albumin 4.2 3.8 - 4.9 g/dL   Globulin, Total 2.0 1.5 - 4.5 g/dL   Bilirubin Total 0.4 0.0 - 1.2 mg/dL   Alkaline Phosphatase 100 44 - 121 IU/L   AST 14 0 - 40 IU/L   ALT 13 0 - 32 IU/L  Lipid panel   Collection Time: 10/19/23 11:27 AM  Result Value Ref Range   Cholesterol, Total 246 (H) 100 - 199 mg/dL   Triglycerides 865 0 - 149 mg/dL   HDL 69 >78 mg/dL   VLDL Cholesterol Cal 21 5 - 40 mg/dL   LDL Chol Calc (NIH) 469 (H) 0 - 99 mg/dL   Chol/HDL Ratio 3.6 0.0 - 4.4 ratio  Thyroid Panel With TSH   Collection Time: 10/19/23 11:27 AM  Result Value Ref Range   TSH 1.360 0.450 - 4.500 uIU/mL   T4, Total 7.7 4.5 - 12.0 ug/dL   T3 Uptake Ratio 26 24 - 39 %   Free Thyroxine Index 2.0 1.2 - 4.9  VITAMIN D 25 Hydroxy (Vit-D Deficiency, Fractures)   Collection Time: 10/19/23 11:27 AM  Result Value Ref Range   Vit D, 25-Hydroxy 22.4 (L) 30.0 - 100.0 ng/mL       Pertinent labs & imaging results that were available during my care of the patient were reviewed by me and considered in my medical decision making.  Assessment &  Plan:  Wilberta was seen today for bmi and fatigue.  Diagnoses and all orders for this visit:  BMI 40.0-44.9, adult (HCC) -     Semaglutide-Weight Management 0.25 MG/0.5ML SOAJ; Inject 0.25 mg into the skin once a week. -     Anemia Profile B -     CMP14+EGFR -     Thyroid Panel With TSH -     VITAMIN D 25 Hydroxy (Vit-D Deficiency,  Fractures)  Primary hypertension -     Semaglutide-Weight Management 0.25 MG/0.5ML SOAJ; Inject 0.25 mg into the skin once a week. -     lisinopril (ZESTRIL) 5 MG tablet; Take 1 tablet (5 mg total) by mouth daily. -     Anemia Profile B -     CMP14+EGFR -     Thyroid Panel With TSH  Mixed hyperlipidemia -     Semaglutide-Weight Management 0.25 MG/0.5ML SOAJ; Inject 0.25 mg into the skin once a week. -     CMP14+EGFR  Pre-diabetes -     Semaglutide-Weight Management 0.25 MG/0.5ML SOAJ; Inject 0.25 mg into the skin once a week. -     Anemia Profile B -     CMP14+EGFR -     Thyroid Panel With TSH -     VITAMIN D 25 Hydroxy (Vit-D Deficiency, Fractures)  Malaise and fatigue -     Anemia Profile B -     CMP14+EGFR -     Thyroid Panel With TSH -     VITAMIN D 25 Hydroxy (Vit-D Deficiency, Fractures)  Seasonal allergies -     Anemia Profile B     Assessment and Plan    Hypertension Blood pressure is elevated with a history of slight elevation. No current antihypertensive medication. Initiating treatment with lisinopril due to its efficacy in lowering blood pressure and renal protective effects. - Start lisinopril 5 mg daily - Order blood work including vitamin D, thyroid function, blood counts, and CMP to assess for other contributing factors to malaise and fatigue  Malaise and fatigue Reports tiredness, weakness, and generalized pain. Differential diagnosis includes uncontrolled hypertension, stress, and possible sinus issues. No recent illness or significant weight changes. Blood work is necessary to rule out other underlying causes. - Order blood work including vitamin D, thyroid function, blood counts, and CMP to assess for underlying causes - Advise to report if symptoms persist despite normal blood work results  Obesity Unable to start Wegovy due to insurance issues. Weight management is a concern with some weight gain. Resending the prescription with a starting dose may  facilitate insurance approval. - Resend prescription for Wegovy to [facility] for insurance approval - Follow up in 8 weeks to assess BMI and medication efficacy  Allergic rhinitis Symptoms consistent with allergies, including cobblestoning in the throat. Currently using over-the-counter Allegra and Flonase intermittently. Symptoms may be exacerbated by seasonal pollen. - Continue using Allegra and Flonase as needed for allergy symptoms          Continue all other maintenance medications.  Follow up plan: Return in about 8 weeks (around 02/21/2024), or if symptoms worsen or fail to improve, for HTN, BMI.   Continue healthy lifestyle choices, including diet (rich in fruits, vegetables, and lean proteins, and low in salt and simple carbohydrates) and exercise (at least 30 minutes of moderate physical activity daily).  Educational handout given for DASH diet, HTN  The above assessment and management plan was discussed with the patient. The patient verbalized understanding of and has  agreed to the management plan. Patient is aware to call the clinic if they develop any new symptoms or if symptoms persist or worsen. Patient is aware when to return to the clinic for a follow-up visit. Patient educated on when it is appropriate to go to the emergency department.   Kari Baars, FNP-C [facility] [phone]

## 2023-12-27 NOTE — Patient Instructions (Signed)
 Goal BP:  For patients younger than 60: Goal BP < 140/90. For patients 60 and older: Goal BP < 150/90. For patients with diabetes: Goal BP < 140/90.  Take your medications faithfully as prescribed. Maintain a healthy weight. Get at least 150 minutes of aerobic exercise per week. Minimize salt intake, less than 2000 mg per day. Minimize alcohol intake.  DASH Eating Plan DASH stands for "Dietary Approaches to Stop Hypertension." The DASH eating plan is a healthy eating plan that has been shown to reduce high blood pressure (hypertension). Additional health benefits may include reducing the risk of type 2 diabetes mellitus, heart disease, and stroke. The DASH eating plan may also help with weight loss.  WHAT DO I NEED TO KNOW ABOUT THE DASH EATING PLAN? For the DASH eating plan, you will follow these general guidelines: Choose foods with a percent daily value for sodium of less than 5% (as listed on the food label). Use salt-free seasonings or herbs instead of table salt or sea salt. Check with your health care provider or pharmacist before using salt substitutes. Eat lower-sodium products, often labeled as "lower sodium" or "no salt added." Eat fresh foods. Eat more vegetables, fruits, and low-fat dairy products. Choose whole grains. Look for the word "whole" as the first word in the ingredient list. Choose fish and skinless chicken or [place] more often than red meat. Limit fish, poultry, and meat to 6 oz (170 g) each day. Limit sweets, desserts, sugars, and sugary drinks. Choose heart-healthy fats. Limit cheese to 1 oz (28 g) per day. Eat more home-cooked food and less restaurant, buffet, and fast food. Limit fried foods. Cook foods using methods other than frying. Limit canned vegetables. If you do use them, rinse them well to decrease the sodium. When eating at a restaurant, ask that your food be prepared with less salt, or no salt if possible.  WHAT FOODS CAN I EAT? Seek help from  a dietitian for individual calorie needs.  Grains Whole grain or whole wheat bread. Brown rice. Whole grain or whole wheat pasta. Quinoa, bulgur, and whole grain cereals. Low-sodium cereals. Corn or whole wheat flour tortillas. Whole grain cornbread. Whole grain crackers. Low-sodium crackers.  Vegetables Fresh or frozen vegetables (raw, steamed, roasted, or grilled). Low-sodium or reduced-sodium tomato and vegetable juices. Low-sodium or reduced-sodium tomato sauce and paste. Low-sodium or reduced-sodium canned vegetables.   Fruits All fresh, canned (in natural juice), or frozen fruits.  Meat and Other Protein Products Ground beef (85% or leaner), grass-fed beef, or beef trimmed of fat. Skinless chicken or [place]. Ground chicken or [place]. Pork trimmed of fat. All fish and seafood. Eggs. Dried beans, peas, or lentils. Unsalted nuts and seeds. Unsalted canned beans.  Dairy Low-fat dairy products, such as skim or 1% milk, 2% or reduced-fat cheeses, low-fat ricotta or cottage cheese, or plain low-fat yogurt. Low-sodium or reduced-sodium cheeses.  Fats and Oils Tub margarines without trans fats. Light or reduced-fat mayonnaise and salad dressings (reduced sodium). Avocado. Safflower, olive, or canola oils. Natural peanut or almond butter.  Other Unsalted popcorn and pretzels. The items listed above may not be a complete list of recommended foods or beverages. Contact your dietitian for more options.  WHAT FOODS ARE NOT RECOMMENDED?  Grains White bread. White pasta. White rice. Refined cornbread. Bagels and croissants. Crackers that contain trans fat.  Vegetables Creamed or fried vegetables. Vegetables in a cheese sauce. Regular canned vegetables. Regular canned tomato sauce and paste. Regular tomato and vegetable juices.  Fruits Dried fruits. Canned fruit in light or heavy syrup. Fruit juice.  Meat and Other Protein Products Fatty cuts of meat. Ribs, chicken wings, bacon, sausage,  bologna, salami, chitterlings, fatback, hot dogs, bratwurst, and packaged luncheon meats. Salted nuts and seeds. Canned beans with salt.  Dairy Whole or 2% milk, cream, half-and-half, and cream cheese. Whole-fat or sweetened yogurt. Full-fat cheeses or blue cheese. Nondairy creamers and whipped toppings. Processed cheese, cheese spreads, or cheese curds.  Condiments Onion and garlic salt, seasoned salt, table salt, and sea salt. Canned and packaged gravies. Worcestershire sauce. Tartar sauce. Barbecue sauce. Teriyaki sauce. Soy sauce, including reduced sodium. Steak sauce. Fish sauce. Oyster sauce. Cocktail sauce. Horseradish. Ketchup and mustard. Meat flavorings and tenderizers. Bouillon cubes. Hot sauce. Tabasco sauce. Marinades. Taco seasonings. Relishes.  Fats and Oils Butter, stick margarine, lard, shortening, ghee, and bacon fat. Coconut, palm kernel, or palm oils. Regular salad dressings.  Other Pickles and olives. Salted popcorn and pretzels.  The items listed above may not be a complete list of foods and beverages to avoid. Contact your dietitian for more information.  WHERE CAN I FIND MORE INFORMATION? National Heart, Lung, and Blood Institute: [employer] Document Released: 08/31/2011 Document Revised: 01/26/2014 Document Reviewed: 07/16/2013 [facility] Patient Information 2015 [place], [place]. This information is not intended to replace advice given to you by your health care provider. Make sure you discuss any questions you have with your health care provider.   I think that you would greatly benefit from seeing a nutritionist.  If you are interested, please call Dr. Gerilyn Pilgrim at [phone] to schedule an appointment.

## 2023-12-27 NOTE — Telephone Encounter (Signed)
 PA request has been Submitted. New Encounter has been or will be created for follow up. For additional info see Pharmacy Prior Auth telephone encounter from 12/27/23.

## 2023-12-27 NOTE — Telephone Encounter (Signed)
 WEGOVY 0.25 MG/0.5ML SOAJ   Pharmacy comment: Alternative Requested:PRIOR AUTH NEEDED; PRODUCT NOT COVERED.

## 2023-12-28 LAB — ANEMIA PROFILE B
Basophils Absolute: 0.1 10*3/uL (ref 0.0–0.2)
Basos: 1 %
EOS (ABSOLUTE): 0.2 10*3/uL (ref 0.0–0.4)
Eos: 2 %
Ferritin: 55 ng/mL (ref 15–150)
Folate: 4.9 ng/mL (ref >3.0)
Hematocrit: 51.9 % — ABNORMAL HIGH (ref 34.0–46.6)
Hemoglobin: 17.4 g/dL — ABNORMAL HIGH (ref 11.1–15.9)
Immature Grans (Abs): 0 10*3/uL (ref 0.0–0.1)
Immature Granulocytes: 1 %
Iron Saturation: 20 % (ref 15–55)
Iron: 79 ug/dL (ref 27–159)
Lymphocytes Absolute: 2.2 10*3/uL (ref 0.7–3.1)
Lymphs: 25 %
MCH: 31.6 pg (ref 26.6–33.0)
MCHC: 33.5 g/dL (ref 31.5–35.7)
MCV: 94 fL (ref 79–97)
Monocytes Absolute: 0.4 10*3/uL (ref 0.1–0.9)
Monocytes: 5 %
Neutrophils Absolute: 5.9 10*3/uL (ref 1.4–7.0)
Neutrophils: 66 %
Platelets: 306 10*3/uL (ref 150–450)
RBC: 5.51 x10E6/uL — ABNORMAL HIGH (ref 3.77–5.28)
RDW: 12.4 % (ref 11.7–15.4)
Retic Ct Pct: 1.2 % (ref 0.6–2.6)
Total Iron Binding Capacity: 400 ug/dL (ref 250–450)
UIBC: 321 ug/dL (ref 131–425)
Vitamin B-12: 285 pg/mL (ref 232–1245)
WBC: 8.7 10*3/uL (ref 3.4–10.8)

## 2023-12-28 LAB — CMP14+EGFR
ALT: 15 IU/L (ref 0–32)
AST: 18 IU/L (ref 0–40)
Albumin: 4.7 g/dL (ref 3.8–4.9)
Alkaline Phosphatase: 120 IU/L (ref 44–121)
BUN/Creatinine Ratio: 21 (ref 9–23)
BUN: 18 mg/dL (ref 6–24)
Bilirubin Total: 0.4 mg/dL (ref 0.0–1.2)
CO2: 22 mmol/L (ref 20–29)
Calcium: 9.9 mg/dL (ref 8.7–10.2)
Chloride: 98 mmol/L (ref 96–106)
Creatinine, Ser: 0.84 mg/dL (ref 0.57–1.00)
Globulin, Total: 2.2 g/dL (ref 1.5–4.5)
Glucose: 96 mg/dL (ref 70–99)
Potassium: 4.6 mmol/L (ref 3.5–5.2)
Sodium: 139 mmol/L (ref 134–144)
Total Protein: 6.9 g/dL (ref 6.0–8.5)
eGFR: 84 mL/min/{1.73_m2} (ref >59)

## 2023-12-28 LAB — VITAMIN D 25 HYDROXY (VIT D DEFICIENCY, FRACTURES): Vit D, 25-Hydroxy: 34.3 ng/mL (ref 30.0–100.0)

## 2023-12-28 LAB — THYROID PANEL WITH TSH
Free Thyroxine Index: 2 (ref 1.2–4.9)
T3 Uptake Ratio: 25 % (ref 24–39)
T4, Total: 7.9 ug/dL (ref 4.5–12.0)
TSH: 1.68 u[IU]/mL (ref 0.450–4.500)

## 2023-12-28 NOTE — Telephone Encounter (Signed)
 Pharmacy Patient Advocate Encounter  Received notification from [facility] that Prior Authorization for [facility] 0.25MG /0.5ML auto-injectors has been DENIED.  Full denial letter will be uploaded to the media tab. See denial reason below.   PA #/Case ID/Reference #:  [id number]    DENIAL REASON: Plan exclusion

## 2024-01-04 ENCOUNTER — Other Ambulatory Visit: Payer: Self-pay | Admitting: Neurology

## 2024-01-08 NOTE — Telephone Encounter (Signed)
 Pt called wanting to know when this will be approved. Pt is wanting to go back to a 90 day supply. Please advise.

## 2024-01-08 NOTE — Telephone Encounter (Signed)
 Called pt and informed her rx called in as 90 days supply as requested. She verbalized understanding.

## 2024-01-15 ENCOUNTER — Ambulatory Visit (INDEPENDENT_AMBULATORY_CARE_PROVIDER_SITE_OTHER): Payer: Medicare HMO

## 2024-01-15 VITALS — BP 138/90 | HR 99 | Wt 201.0 lb

## 2024-01-15 DIAGNOSIS — Z0000 Encounter for general adult medical examination without abnormal findings: Secondary | ICD-10-CM | POA: Diagnosis not present

## 2024-01-15 NOTE — Patient Instructions (Signed)
 Judy Evans , Thank you for taking time to come for your Medicare Wellness Visit. I appreciate your ongoing commitment to your health goals. Please review the following plan we discussed and let me know if I can assist you in the future.   Referrals/Orders/Follow-Ups/Clinician Recommendations: Please update your vaccines for the following: pneumonia, Shingles vaccine. You will also need to make an appoint for your lung cancer/mammo screening. Thank you for time this morning in completing your Medicare Annual Wellness visit with me.   This is a list of the screening recommended for you and due dates:  Health Maintenance  Topic Date Due   Zoster (Shingles) Vaccine (1 of 2) Never done   Pneumococcal Vaccination (2 of 2 - PCV) 08/24/2018   Mammogram  01/26/2023   Screening for Lung Cancer  02/04/2023   Hepatitis C Screening  03/06/2024*   HIV Screening  03/06/2024*   COVID-19 Vaccine (1) 01/30/2025*   Flu Shot  04/25/2024   Colon Cancer Screening  07/14/2024   Medicare Annual Wellness Visit  01/14/2025   DTaP/Tdap/Td vaccine (3 - Td or Tdap) 06/15/2027   HPV Vaccine  Aged Out   Meningitis B Vaccine  Aged Out  *Topic was postponed. The date shown is not the original due date.    Advanced directives: (Declined) Advance directive discussed with you today. Even though you declined this today, please call our office should you change your mind, and we can give you the proper paperwork for you to fill out.  Next Medicare Annual Wellness Visit scheduled for next year: Yes

## 2024-01-15 NOTE — Progress Notes (Signed)
 Subjective:   Judy Evans is a 53 y.o. who presents for a Medicare Wellness preventive visit.  Visit Complete: Virtual I connected with  Judy Evans on 01/15/24 by a audio enabled telemedicine application and verified that I am speaking with the correct person using two identifiers.  Patient Location: Home  Provider Location: Home Office  I discussed the limitations of evaluation and management by telemedicine. The patient expressed understanding and agreed to proceed.  Vital Signs: Because this visit was a virtual/telehealth visit, some criteria may be missing or patient reported. Any vitals not documented were not able to be obtained and vitals that have been documented are patient reported.  VideoDeclined- This patient declined Librarian, academic. Therefore the visit was completed with audio only.  Persons Participating in Visit: Patient.  AWV Questionnaire: No: Patient Medicare AWV questionnaire was not completed prior to this visit.  Cardiac Risk Factors include: advanced age (>67men, >46 women);obesity (BMI >30kg/m2)     Objective:    Today's Vitals   01/15/24 0802  BP: (!) 138/90  Pulse: 99  Weight: 201 lb (91.2 kg)   Body mass index is 36.76 kg/m.     01/15/2024    8:26 AM 01/10/2023    1:56 PM 01/04/2022    2:15 PM 12/01/2021   10:49 AM 11/22/2021   12:35 PM 01/12/2021    5:33 PM 11/30/2020    1:20 PM  Advanced Directives  Does Patient Have a Medical Advance Directive? No No No No No No No  Would patient like information on creating a medical advance directive?  No - Patient declined No - Patient declined   No - Patient declined No - Patient declined    Current Medications (verified) Outpatient Encounter Medications as of 01/15/2024  Medication Sig   albuterol  (VENTOLIN  HFA) 108 (90 Base) MCG/ACT inhaler USE 2 PUFFS BY MOUTH EVERY 4 HOURS AS NEEDED FOR WHEEZING OR SHORTNESS OF BREATH   amitriptyline  (ELAVIL ) 25 MG tablet  TAKE 2 TABLETS BY MOUTH AT BEDTIME   Atogepant  (QULIPTA ) 30 MG TABS Take 1 tablet (30 mg total) by mouth daily.   Budeson-Glycopyrrol-Formoterol  (BREZTRI  AEROSPHERE) 160-9-4.8 MCG/ACT AERO Inhale 2 puffs into the lungs 2 (two) times daily.   celecoxib  (CELEBREX ) 200 MG capsule TAKE 1 CAPSULE BY MOUTH EVERY DAY   cetirizine  (ZYRTEC ) 10 MG tablet Take 1 tablet (10 mg total) by mouth daily.   COSENTYX SENSOREADY, 300 MG, 150 MG/ML SOAJ    fish oil-omega-3 fatty acids  1000 MG capsule Take 2 capsules (2 g total) by mouth daily.   fluticasone  (FLONASE ) 50 MCG/ACT nasal spray Administer 2 sprays in each nostril daily.   gabapentin  (NEURONTIN ) 800 MG tablet TAKE 1 TABLET (800 MG TOTAL) BY MOUTH 4 (FOUR) TIMES DAILY. (NEEDS TO BE SEEN BEFORE NEXT REFILL)   hydrOXYzine  (VISTARIL ) 50 MG capsule Take 1 capsule (50 mg total) by mouth 3 (three) times daily as needed.   ipratropium-albuterol  (DUONEB) 0.5-2.5 (3) MG/3ML SOLN INHALE 3 ML (CONTENTS OF 1 VIAL) BY NEBULIZER EVERY 6 HOURS AS NEEDED   nystatin  ointment (MYCOSTATIN ) Apply 1 application topically 2 (two) times daily as needed.   omeprazole  (PRILOSEC) 40 MG capsule TAKE 1 CAPSULE BY MOUTH EVERY DAY   triamcinolone  cream (KENALOG ) 0.1 %    triamcinolone  ointment (KENALOG ) 0.5 % Apply topically 2 (two) times daily.   Fremanezumab -vfrm (AJOVY ) 225 MG/1.5ML SOSY INJECT 1 SYRINGE UNDER THE SKIN EVERY 28 DAYS (Patient not taking: Reported on 01/15/2024)  lisinopril  (ZESTRIL ) 5 MG tablet Take 1 tablet (5 mg total) by mouth daily. (Patient not taking: Reported on 01/15/2024)   Semaglutide -Weight Management 0.25 MG/0.5ML SOAJ Inject 0.25 mg into the skin once a week. (Patient not taking: Reported on 01/15/2024)   No facility-administered encounter medications on file as of 01/15/2024.    Allergies (verified) Rosuvastatin calcium, Statins, Wellbutrin [bupropion], Hctz [hydrochlorothiazide], Hydrocodone -acetaminophen , Oxycodone , and Penicillins   History: Past  Medical History:  Diagnosis Date   Anxiety    Aphasia    Arthritis    COPD (chronic obstructive pulmonary disease) (HCC)    CRPS (complex regional pain syndrome type I)    DDD (degenerative disc disease), cervical    Degenerative disc disease at L5-S1 level    Fibromyalgia    GERD (gastroesophageal reflux disease)    IBS (irritable bowel syndrome)    Migraines    Moody 01/22/2015   Psoriasis    Scoliosis    Spondylolysis    Past Surgical History:  Procedure Laterality Date   ABDOMINAL HYSTERECTOMY     BIOPSY  09/14/2016   Procedure: BIOPSY;  Surgeon: Suzette Espy, MD;  Location: [facility];  Service: Endoscopy;;  gastric    CARDIAC CATHETERIZATION  2015   patient reported it to be normal. "I was dying in pain during procedure".    CARPAL TUNNEL RELEASE Right    CESAREAN SECTION     CHOLECYSTECTOMY     COLONOSCOPY  06/2014   Sims Duck DeMason: normal   DILATION AND CURETTAGE OF UTERUS     ESOPHAGOGASTRODUODENOSCOPY  07/2014   Cammie Cellar: Normal   ESOPHAGOGASTRODUODENOSCOPY (EGD) WITH PROPOFOL  N/A 09/14/2016   Dr. Riley Cheadle: Normal esophagus status post empiric dilation, erosive gastropathy with biopsies showing chronic inactive gastritis, no H pylori.   EXTERNAL FIXATION OF FINGER Left 12/01/2021   Procedure: LEFT RING DIGIT WIDGET PLACEMENT;  Surgeon: Brunilda Capra, MD;  Location: [facility];  Service: Orthopedics;  Laterality: Left;   FOOT SURGERY Bilateral    removal of heel spurs   MALONEY DILATION  09/14/2016   Procedure: MALONEY DILATION;  Surgeon: Suzette Espy, MD;  Location: [facility];  Service: Endoscopy;;   rotator cuff surgery Right    TONSILLECTOMY     Family History  Problem Relation Age of Onset   COPD Mother    Diabetes Mother    Osteoarthritis Mother    Atrial fibrillation Mother    Healthy Father    Atrial fibrillation Father    Lupus Sister    CAD Sister 18       CABG   Diabetes Sister    Migraines Sister    Factor V Leiden  deficiency Sister    Arthritis Sister    Psoriasis Sister    Early death Brother        pneumonia   Cancer Maternal Grandmother 52       colon   Arthritis Maternal Grandmother    Diabetes Maternal Grandmother    Dementia Maternal Grandmother    Other Maternal Grandfather        brain tumor   Breast cancer Paternal Grandmother    Cancer Paternal Grandmother        breast   Cancer Paternal Grandfather        lung   Other Paternal Grandfather        brain aneurysm   Asthma Daughter    Migraines Daughter    Asthma Son    Social History  Socioeconomic History   Marital status: Legally Separated    Spouse name: Not on file   Number of children: 2   Years of education: GED   Highest education level: GED or equivalent  Occupational History   Occupation: Disabled  Tobacco Use   Smoking status: Every Day    Current packs/day: 1.50    Average packs/day: 1.5 packs/day for 30.0 years (45.0 ttl pk-yrs)    Types: Cigarettes   Smokeless tobacco: Never  Vaping Use   Vaping status: Former  Substance and Sexual Activity   Alcohol use: No    Comment: rarely   Drug use: Yes    Types: Marijuana   Sexual activity: Not Currently    Birth control/protection: Surgical    Comment: hyst  Other Topics Concern   Not on file  Social History Narrative   Caffeine use: Drinks coffee (2 cups per day), tea/soda- 2-3 cups per day   Right handed   Lives alone.   Lost oldest sister 07/2021 due to cardiac issues. Youngest sister died 21-Oct-2021 due to unknown causes.    Social Drivers of Corporate investment banker Strain: Low Risk  (01/15/2024)   Overall Financial Resource Strain (CARDIA)    Difficulty of Paying Living Expenses: Not hard at all  Food Insecurity: No Food Insecurity (01/15/2024)   Hunger Vital Sign    Worried About Running Out of Food in the Last Year: Never true    Ran Out of Food in the Last Year: Never true  Transportation Needs: No Transportation Needs (01/15/2024)   PRAPARE  - Administrator, Civil Service (Medical): No    Lack of Transportation (Non-Medical): No  Physical Activity: Sufficiently Active (01/15/2024)   Exercise Vital Sign    Days of Exercise per Week: 3 days    Minutes of Exercise per Session: 60 min  Stress: Stress Concern Present (01/15/2024)   [employer] of Occupational Health - Occupational Stress Questionnaire    Feeling of Stress : Rather much  Social Connections: Socially Isolated (01/15/2024)   Social Connection and Isolation Panel [NHANES]    Frequency of Communication with Friends and Family: More than three times a week    Frequency of Social Gatherings with Friends and Family: More than three times a week    Attends Religious Services: Never    Database administrator or Organizations: No    Attends Engineer, structural: Never    Marital Status: Separated    Tobacco Counseling Ready to quit: No Counseling given: Yes    Clinical Intake:  Pre-visit preparation completed: Yes  Pain : No/denies pain     BMI - recorded: 36.76 Nutritional Status: BMI > 30  Obese Nutritional Risks: None Diabetes: No  Lab Results  Component Value Date   HGBA1C 5.4 03/07/2023   HGBA1C 5.5 03/22/2018   HGBA1C 5.7 01/22/2017     How often do you need to have someone help you when you read instructions, pamphlets, or other written materials from your doctor or pharmacy?: 1 - Never  Interpreter Needed?: No  Information entered by :: Anola Basques T/CMA   Activities of Daily Living     01/15/2024    8:16 AM  In your present state of health, do you have any difficulty performing the following activities:  Hearing? 0  Vision? 0  Difficulty concentrating or making decisions? 0  Walking or climbing stairs? 0  Dressing or bathing? 0  Doing errands, shopping? 0  Preparing Food  and eating ? N  Using the Toilet? N  In the past six months, have you accidently leaked urine? N  Do you have problems with loss of bowel  control? N  Managing your Medications? N  Managing your Finances? N  Housekeeping or managing your Housekeeping? N    Patient Care Team: Galvin Jules, FNP as PCP - General (Family Medicine) Eilleen Grates, MD as PCP - Cardiology (Cardiology) Riley Cheadle Windsor Hatcher, MD as Consulting Physician (Gastroenterology) Lorry Rouge, MD as Referring Physician (Dermatology) Rakes, Georgeann Kindred, FNP as Nurse Practitioner (Family Medicine)  Indicate any recent Medical Services you may have received from other than [facility] providers in the past year (date may be approximate).     Assessment:   This is a routine wellness examination for [place].  Hearing/Vision screen Hearing Screening - Comments:: Pt denies hear dif Vision Screening - Comments:: Pt denies vision dif  [facility]   Goals Addressed             This Visit's Progress    Patient Stated       Pt want to loose 50 lbs/find a new place       Depression Screen     01/15/2024    8:13 AM 10/19/2023   11:25 AM 10/04/2023    3:13 PM 04/18/2023   11:14 AM 03/07/2023   12:02 PM 01/10/2023    1:55 PM 12/29/2022    9:19 AM  PHQ 2/9 Scores  PHQ - 2 Score 2 1 0 1 1 0 2  PHQ- 9 Score 8 2 2 4 4  0 9    Fall Risk     01/15/2024    8:10 AM 10/04/2023    3:13 PM 04/18/2023   11:14 AM 03/07/2023   12:02 PM 01/10/2023    1:54 PM  Fall Risk   Falls in the past year? 0 0 0 0 0  Number falls in past yr: 0 0   0  Injury with Fall? 0 0   0  Risk for fall due to : No Fall Risks No Fall Risks   No Fall Risks  Follow up Falls prevention discussed;Falls evaluation completed    Falls prevention discussed    MEDICARE RISK AT HOME:  Medicare Risk at Home Any stairs in or around the home?: Yes If so, are there any without handrails?: Yes Home free of loose throw rugs in walkways, pet beds, electrical cords, etc?: Yes Adequate lighting in your home to reduce risk of falls?: Yes Life alert?: No Use of a cane, walker or w/c?:  No Grab bars in the bathroom?: No Shower chair or bench in shower?: No Elevated toilet seat or a handicapped toilet?: No  TIMED UP AND GO:  Was the test performed?  No  Cognitive Function: 6CIT completed    01/02/2018    3:17 PM  MMSE - Mini Mental State Exam  Orientation to time 5  Orientation to Place 5  Registration 3  Attention/ Calculation 5  Recall 3  Language- name 2 objects 2  Language- repeat 1  Language- follow 3 step command 3  Language- read & follow direction 1  Write a sentence 1  Copy design 1  Total score 30        01/15/2024    8:31 AM 01/10/2023    1:56 PM 11/30/2020    1:21 PM 11/11/2019    2:29 PM  6CIT Screen  What Year? 0 points  0 points 0 points 0 points  What month? 0 points 0 points 0 points 0 points  What time? 0 points 0 points 0 points 0 points  Count back from 20 0 points 0 points 0 points 0 points  Months in reverse 0 points 0 points 0 points 0 points  Repeat phrase 0 points 0 points 0 points 0 points  Total Score 0 points 0 points 0 points 0 points    Immunizations Immunization History  Administered Date(s) Administered   Influenza Split 06/20/2011   Influenza,inj,Quad PF,6+ Mos 08/24/2017, 06/20/2018   Influenza-Unspecified 06/10/2013, 11/01/2015   PPD Test 03/22/2018   Pneumococcal Polysaccharide-23 06/20/2011, 08/24/2017   Td 06/10/2004   Tdap 06/14/2017    Screening Tests Health Maintenance  Topic Date Due   Zoster Vaccines- Shingrix (1 of 2) Never done   Pneumococcal Vaccine 60-82 Years old (2 of 2 - PCV) 08/24/2018   MAMMOGRAM  01/26/2023   Lung Cancer Screening  02/04/2023   Hepatitis C Screening  03/06/2024 (Originally 01/22/1989)   HIV Screening  03/06/2024 (Originally 01/23/1987)   COVID-19 Vaccine (1) 01/30/2025 (Originally 01/22/1976)   INFLUENZA VACCINE  04/25/2024   Colonoscopy  07/14/2024   Medicare Annual Wellness (AWV)  01/14/2025   DTaP/Tdap/Td (3 - Td or Tdap) 06/15/2027   HPV VACCINES  Aged Out    Meningococcal B Vaccine  Aged Out    Health Maintenance  Health Maintenance Due  Topic Date Due   Zoster Vaccines- Shingrix (1 of 2) Never done   Pneumococcal Vaccine 24-60 Years old (2 of 2 - PCV) 08/24/2018   MAMMOGRAM  01/26/2023   Lung Cancer Screening  02/04/2023   Health Maintenance Items Addressed: See Nurse Notes  Additional Screening:  Vision Screening: Recommended annual ophthalmology exams for early detection of glaucoma and other disorders of the eye.  Dental Screening: Recommended annual dental exams for proper oral hygiene  Community Resource Referral / Chronic Care Management: CRR required this visit?  Yes   CCM required this visit?  No     Plan:     I have personally reviewed and noted the following in the patient's chart:   Medical and social history Use of alcohol, tobacco or illicit drugs  Current medications and supplements including opioid prescriptions. Patient is not currently taking opioid prescriptions. Functional ability and status Nutritional status Physical activity Advanced directives List of other physicians Hospitalizations, surgeries, and ER visits in previous 12 months Vitals Screenings to include cognitive, depression, and falls Referrals and appointments  In addition, I have reviewed and discussed with patient certain preventive protocols, quality metrics, and best practice recommendations. A written personalized care plan for preventive services as well as general preventive health recommendations were provided to patient.     Michaelle Adolphus, CMA   01/15/2024   After Visit Summary: (MyChart) Due to this being a telephonic visit, the after visit summary with patients personalized plan was offered to patient via MyChart   Notes: pt is also due pnuemonia vaccine, Shingles vaccine, lung cancer/mammo screening. [id number] placed per aware and see routing msg to pcp

## 2024-01-18 ENCOUNTER — Other Ambulatory Visit: Payer: Self-pay | Admitting: Family Medicine

## 2024-01-18 DIAGNOSIS — J449 Chronic obstructive pulmonary disease, unspecified: Secondary | ICD-10-CM

## 2024-01-21 ENCOUNTER — Telehealth: Payer: Self-pay

## 2024-01-21 NOTE — Progress Notes (Signed)
 Complex Care Management Note  Care Guide Note 01/21/2024 Name: PEBBLE RUSEK MRN: [id number] DOB: 05-Mar-1971  [name] is a 53 y.o. year old female who sees Rakes, Georgeann Kindred, FNP for primary care. I reached out to [name] by phone today to offer complex care management services.  Ms. Rosiak was given information about Complex Care Management services today including:   The Complex Care Management services include support from the care team which includes your Nurse Care Manager, Clinical Social Worker, or Pharmacist.  The Complex Care Management team is here to help remove barriers to the health concerns and goals most important to you. Complex Care Management services are voluntary, and the patient may decline or stop services at any time by request to their care team member.   Complex Care Management Consent Status: Patient agreed to services and verbal consent obtained.   Follow up plan:  Telephone appointment with complex care management team member scheduled for:  01/28/2024  Encounter Outcome:  Patient Scheduled  Lenton Rail , RMA     [place]  [facility], [facility] Guide  Direct Dial: [phone]  Website: [email or web address]

## 2024-01-24 ENCOUNTER — Encounter: Payer: Self-pay | Admitting: Acute Care

## 2024-01-28 ENCOUNTER — Other Ambulatory Visit: Payer: Self-pay | Admitting: Licensed Clinical Social Worker

## 2024-01-28 DIAGNOSIS — F411 Generalized anxiety disorder: Secondary | ICD-10-CM

## 2024-01-29 DIAGNOSIS — L400 Psoriasis vulgaris: Secondary | ICD-10-CM | POA: Diagnosis not present

## 2024-01-29 NOTE — Patient Instructions (Signed)
 Visit Information  Thank you for taking time to visit with me today. Please don't hesitate to contact me if I can be of assistance to you before our next scheduled appointment.   Referral to [place] [facility].  Please call the care guide team at [phone] if you need to cancel, schedule, or reschedule an appointment.   Please call 911 if you are experiencing a Mental Health or Behavioral Health Crisis or need someone to talk to.  Fletcher Humble MSW, LCSW Licensed Clinical Social Worker  [facility], Population Health Direct Dial: [phone]  Fax: [phone]

## 2024-01-29 NOTE — Patient Outreach (Signed)
 Complex Care Management   Visit Note  01/29/2024  Name:  Judy Evans MRN: [id number] DOB: [birth year]  Situation: Referral received for Complex Care Management related to SDOH Barriers:  access to mental health providers  I obtained verbal consent from Patient.  Visit completed with P Canady  on the phone  Background:   Past Medical History:  Diagnosis Date   Anxiety    Aphasia    Arthritis    COPD (chronic obstructive pulmonary disease) (HCC)    CRPS (complex regional pain syndrome type I)    DDD (degenerative disc disease), cervical    Degenerative disc disease at L5-S1 level    Fibromyalgia    GERD (gastroesophageal reflux disease)    IBS (irritable bowel syndrome)    Migraines    Moody 01/22/2015   Psoriasis    Scoliosis    Spondylolysis     Assessment: Patient Reported Symptoms:  Cognitive Cognitive Status: Able to follow simple commands, Alert and oriented to person, place, and time      Neurological Neurological Review of Symptoms: No symptoms reported    HEENT HEENT Symptoms Reported: No symptoms reported      Cardiovascular      Respiratory Respiratory Symptoms Reported: No symptoms reported    Endocrine Patient reports the following symptoms related to hypoglycemia or hyperglycemia : No symptoms reported    Gastrointestinal Gastrointestinal Symptoms Reported: No symptoms reported      Genitourinary Genitourinary Symptoms Reported: No symptoms reported    Integumentary Integumentary Symptoms Reported: No symptoms reported    Musculoskeletal          Psychosocial       Do you feel physically threatened by others?: No      01/15/2024    8:13 AM  Depression screen PHQ 2/9  Decreased Interest 1  Down, Depressed, Hopeless 1  PHQ - 2 Score 2  Altered sleeping 1  Tired, decreased energy 3  Change in appetite 1  Feeling bad or failure about yourself  0  Trouble concentrating 1  Moving slowly or fidgety/restless 0  Suicidal thoughts 0   PHQ-9 Score 8  Difficult doing work/chores Very difficult    There were no vitals filed for this visit.  Medications Reviewed Today   Medications were not reviewed in this encounter     Recommendation:   Referral sent to [place] [facility]  Follow Up Plan:   Closing From:  Complex Care Management  Fletcher Humble MSW, LCSW Licensed Clinical Social Worker  [facility], Population Health Direct Dial: [phone]  Fax: [phone]

## 2024-02-06 ENCOUNTER — Other Ambulatory Visit: Payer: Self-pay | Admitting: Family Medicine

## 2024-02-06 DIAGNOSIS — J449 Chronic obstructive pulmonary disease, unspecified: Secondary | ICD-10-CM

## 2024-02-22 ENCOUNTER — Ambulatory Visit: Admitting: Family Medicine

## 2024-03-05 ENCOUNTER — Other Ambulatory Visit: Payer: Self-pay | Admitting: Family Medicine

## 2024-03-05 DIAGNOSIS — J449 Chronic obstructive pulmonary disease, unspecified: Secondary | ICD-10-CM

## 2024-03-11 ENCOUNTER — Ambulatory Visit: Admitting: Cardiology

## 2024-04-07 ENCOUNTER — Other Ambulatory Visit: Payer: Self-pay | Admitting: Family Medicine

## 2024-04-07 DIAGNOSIS — J449 Chronic obstructive pulmonary disease, unspecified: Secondary | ICD-10-CM

## 2024-04-16 ENCOUNTER — Other Ambulatory Visit: Payer: Self-pay | Admitting: Family Medicine

## 2024-04-16 DIAGNOSIS — K219 Gastro-esophageal reflux disease without esophagitis: Secondary | ICD-10-CM

## 2024-04-17 ENCOUNTER — Encounter: Payer: 59 | Admitting: Family Medicine

## 2024-04-28 ENCOUNTER — Encounter: Payer: Self-pay | Admitting: Acute Care

## 2024-04-30 ENCOUNTER — Ambulatory Visit: Admitting: Family Medicine

## 2024-04-30 ENCOUNTER — Encounter: Payer: Self-pay | Admitting: Family Medicine

## 2024-04-30 VITALS — BP 118/72 | HR 93 | Temp 97.5°F | Ht 62.0 in | Wt 202.2 lb

## 2024-04-30 DIAGNOSIS — E559 Vitamin D deficiency, unspecified: Secondary | ICD-10-CM

## 2024-04-30 DIAGNOSIS — Z0184 Encounter for antibody response examination: Secondary | ICD-10-CM

## 2024-04-30 DIAGNOSIS — R350 Frequency of micturition: Secondary | ICD-10-CM | POA: Diagnosis not present

## 2024-04-30 DIAGNOSIS — E782 Mixed hyperlipidemia: Secondary | ICD-10-CM | POA: Diagnosis not present

## 2024-04-30 DIAGNOSIS — Z23 Encounter for immunization: Secondary | ICD-10-CM | POA: Diagnosis not present

## 2024-04-30 DIAGNOSIS — Z1159 Encounter for screening for other viral diseases: Secondary | ICD-10-CM

## 2024-04-30 DIAGNOSIS — Z6836 Body mass index (BMI) 36.0-36.9, adult: Secondary | ICD-10-CM

## 2024-04-30 DIAGNOSIS — H6501 Acute serous otitis media, right ear: Secondary | ICD-10-CM

## 2024-04-30 DIAGNOSIS — Z114 Encounter for screening for human immunodeficiency virus [HIV]: Secondary | ICD-10-CM

## 2024-04-30 DIAGNOSIS — J418 Mixed simple and mucopurulent chronic bronchitis: Secondary | ICD-10-CM | POA: Diagnosis not present

## 2024-04-30 DIAGNOSIS — Z0001 Encounter for general adult medical examination with abnormal findings: Secondary | ICD-10-CM | POA: Diagnosis not present

## 2024-04-30 DIAGNOSIS — Z0000 Encounter for general adult medical examination without abnormal findings: Secondary | ICD-10-CM | POA: Diagnosis not present

## 2024-04-30 DIAGNOSIS — F419 Anxiety disorder, unspecified: Secondary | ICD-10-CM

## 2024-04-30 DIAGNOSIS — Z1231 Encounter for screening mammogram for malignant neoplasm of breast: Secondary | ICD-10-CM

## 2024-04-30 DIAGNOSIS — N898 Other specified noninflammatory disorders of vagina: Secondary | ICD-10-CM

## 2024-04-30 DIAGNOSIS — I10 Essential (primary) hypertension: Secondary | ICD-10-CM | POA: Diagnosis not present

## 2024-04-30 DIAGNOSIS — R7309 Other abnormal glucose: Secondary | ICD-10-CM | POA: Diagnosis not present

## 2024-04-30 LAB — URINALYSIS, ROUTINE W REFLEX MICROSCOPIC
Bilirubin, UA: NEGATIVE
Glucose, UA: NEGATIVE
Ketones, UA: NEGATIVE
Nitrite, UA: NEGATIVE
Protein,UA: NEGATIVE
RBC, UA: NEGATIVE
Specific Gravity, UA: 1.01 (ref 1.005–1.030)
Urobilinogen, Ur: 0.2 mg/dL (ref 0.2–1.0)
pH, UA: 6 (ref 5.0–7.5)

## 2024-04-30 LAB — WET PREP FOR TRICH, YEAST, CLUE
Clue Cell Exam: NEGATIVE
Trichomonas Exam: NEGATIVE
Yeast Exam: NEGATIVE

## 2024-04-30 LAB — MICROSCOPIC EXAMINATION
Bacteria, UA: NONE SEEN
Epithelial Cells (non renal): NONE SEEN /HPF (ref 0–10)
RBC, Urine: NONE SEEN /HPF (ref 0–2)
Renal Epithel, UA: NONE SEEN /HPF
Yeast, UA: NONE SEEN

## 2024-04-30 LAB — BAYER DCA HB A1C WAIVED: HB A1C (BAYER DCA - WAIVED): 4.6 % — ABNORMAL LOW (ref 4.8–5.6)

## 2024-04-30 NOTE — Patient Instructions (Signed)
 Goal BP:  For patients younger than 60: Goal BP < 140/90. For patients 60 and older: Goal BP < 150/90. For patients with diabetes: Goal BP < 140/90.  Take your medications faithfully as prescribed. Maintain a healthy weight. Get at least 150 minutes of aerobic exercise per week. Minimize salt intake, less than 2000 mg per day. Minimize alcohol intake.  DASH Eating Plan DASH stands for "Dietary Approaches to Stop Hypertension." The DASH eating plan is a healthy eating plan that has been shown to reduce high blood pressure (hypertension). Additional health benefits may include reducing the risk of type 2 diabetes mellitus, heart disease, and stroke. The DASH eating plan may also help with weight loss.  WHAT DO I NEED TO KNOW ABOUT THE DASH EATING PLAN? For the DASH eating plan, you will follow these general guidelines: Choose foods with a percent daily value for sodium of less than 5% (as listed on the food label). Use salt-free seasonings or herbs instead of table salt or sea salt. Check with your health care provider or pharmacist before using salt substitutes. Eat lower-sodium products, often labeled as "lower sodium" or "no salt added." Eat fresh foods. Eat more vegetables, fruits, and low-fat dairy products. Choose whole grains. Look for the word "whole" as the first word in the ingredient list. Choose fish and skinless chicken or [place] more often than red meat. Limit fish, poultry, and meat to 6 oz (170 g) each day. Limit sweets, desserts, sugars, and sugary drinks. Choose heart-healthy fats. Limit cheese to 1 oz (28 g) per day. Eat more home-cooked food and less restaurant, buffet, and fast food. Limit fried foods. Cook foods using methods other than frying. Limit canned vegetables. If you do use them, rinse them well to decrease the sodium. When eating at a restaurant, ask that your food be prepared with less salt, or no salt if possible.  WHAT FOODS CAN I EAT? Seek help from  a dietitian for individual calorie needs.  Grains Whole grain or whole wheat bread. Brown rice. Whole grain or whole wheat pasta. Quinoa, bulgur, and whole grain cereals. Low-sodium cereals. Corn or whole wheat flour tortillas. Whole grain cornbread. Whole grain crackers. Low-sodium crackers.  Vegetables Fresh or frozen vegetables (raw, steamed, roasted, or grilled). Low-sodium or reduced-sodium tomato and vegetable juices. Low-sodium or reduced-sodium tomato sauce and paste. Low-sodium or reduced-sodium canned vegetables.   Fruits All fresh, canned (in natural juice), or frozen fruits.  Meat and Other Protein Products Ground beef (85% or leaner), grass-fed beef, or beef trimmed of fat. Skinless chicken or [place]. Ground chicken or [place]. Pork trimmed of fat. All fish and seafood. Eggs. Dried beans, peas, or lentils. Unsalted nuts and seeds. Unsalted canned beans.  Dairy Low-fat dairy products, such as skim or 1% milk, 2% or reduced-fat cheeses, low-fat ricotta or cottage cheese, or plain low-fat yogurt. Low-sodium or reduced-sodium cheeses.  Fats and Oils Tub margarines without trans fats. Light or reduced-fat mayonnaise and salad dressings (reduced sodium). Avocado. Safflower, olive, or canola oils. Natural peanut or almond butter.  Other Unsalted popcorn and pretzels. The items listed above may not be a complete list of recommended foods or beverages. Contact your dietitian for more options.  WHAT FOODS ARE NOT RECOMMENDED?  Grains White bread. White pasta. White rice. Refined cornbread. Bagels and croissants. Crackers that contain trans fat.  Vegetables Creamed or fried vegetables. Vegetables in a cheese sauce. Regular canned vegetables. Regular canned tomato sauce and paste. Regular tomato and vegetable juices.  Fruits Dried fruits. Canned fruit in light or heavy syrup. Fruit juice.  Meat and Other Protein Products Fatty cuts of meat. Ribs, chicken wings, bacon, sausage,  bologna, salami, chitterlings, fatback, hot dogs, bratwurst, and packaged luncheon meats. Salted nuts and seeds. Canned beans with salt.  Dairy Whole or 2% milk, cream, half-and-half, and cream cheese. Whole-fat or sweetened yogurt. Full-fat cheeses or blue cheese. Nondairy creamers and whipped toppings. Processed cheese, cheese spreads, or cheese curds.  Condiments Onion and garlic salt, seasoned salt, table salt, and sea salt. Canned and packaged gravies. Worcestershire sauce. Tartar sauce. Barbecue sauce. Teriyaki sauce. Soy sauce, including reduced sodium. Steak sauce. Fish sauce. Oyster sauce. Cocktail sauce. Horseradish. Ketchup and mustard. Meat flavorings and tenderizers. Bouillon cubes. Hot sauce. Tabasco sauce. Marinades. Taco seasonings. Relishes.  Fats and Oils Butter, stick margarine, lard, shortening, ghee, and bacon fat. Coconut, palm kernel, or palm oils. Regular salad dressings.  Other Pickles and olives. Salted popcorn and pretzels.  The items listed above may not be a complete list of foods and beverages to avoid. Contact your dietitian for more information.  WHERE CAN I FIND MORE INFORMATION? National Heart, Lung, and Blood Institute: [employer] Document Released: 08/31/2011 Document Revised: 01/26/2014 Document Reviewed: 07/16/2013 [facility] Patient Information 2015 [place], [place]. This information is not intended to replace advice given to you by your health care provider. Make sure you discuss any questions you have with your health care provider.   I think that you would greatly benefit from seeing a nutritionist.  If you are interested, please call Dr. Gerilyn Pilgrim at [phone] to schedule an appointment.

## 2024-04-30 NOTE — Progress Notes (Addendum)
 Complete physical exam  Patient: Judy Evans   DOB: March 13, 1971   53 y.o. Female  MRN: [id number]  Subjective:    Chief Complaint  Patient presents with   Annual Exam    Judy Evans is a 53 y.o. female who presents today for a complete physical exam. She reports consuming a general diet. The patient does not participate in regular exercise at present. She generally feels well. She reports sleeping well. She does have additional problems to discuss today.   Judy Evans is a 53 year old female who presents for an annual physical exam.  She discontinued her blood pressure medication after one month due to chest pains and has not monitored her blood pressure since. Hydroxyzine  causes increased irritability, but she continues its use.  She experiences ongoing anxiety and irritability, with worsening symptoms to the extent that she finds social interactions intolerable. Her last mental health consultation was in 2009, after which she ceased all medications due to concerns about overmedication and a subsequent hospitalization.  She continues to smoke and had previously scheduled a lung cancer screening, which she canceled due to personal commitments.  She experiences pressure in her bladder area a couple of times a week, especially when she has to hold her urine. No odor, itching, burning, increased urination, blood, or discharge. She has a history of a hysterectomy performed 20 years ago and has not seen a gynecologist since.  She has been experiencing dizziness and blurred vision for about a month, associated with bending over or standing still. She has a history of occipital neuralgia. She has not been using her nasal spray regularly.  She takes vitamin D  and fish oil supplements but does not take calcium with her vitamin D .       Most recent fall risk assessment:    04/30/2024    2:13 PM  Fall Risk   Falls in the past year? 0  Risk for fall due to : No Fall Risks   Follow up Falls evaluation completed     Most recent depression screenings:    04/30/2024    2:13 PM 01/15/2024    8:13 AM  PHQ 2/9 Scores  PHQ - 2 Score 2 2  PHQ- 9 Score 4 8    Vision:Not within last year  and Dental: No current dental problems  Patient Active Problem List   Diagnosis Date Noted   Polyneuropathy 04/18/2023   Anxiety 03/07/2023   Vitamin D  deficiency 03/07/2023   Precordial chest pain 01/11/2022   Complex regional pain syndrome type 1 of left upper extremity 03/04/2021   Subacromial bursitis of right shoulder joint 11/24/2020   Trochanteric bursitis of right hip 11/24/2020   Pain of left lower leg 11/15/2020   Bilateral low back pain with right-sided sciatica 11/15/2020   Thrush 11/15/2020   Insomnia 11/19/2018   Other headache syndrome 07/18/2018   Occipital neuralgia 07/18/2018   Family history of cerebral aneurysm 07/18/2018   Numbness 04/23/2018   Cervical radiculopathy 04/23/2018   Encounter for long-term use of opiate analgesic 04/22/2018   Lumbar degenerative disc disease 04/22/2018   Spondylosis of lumbar spine 04/22/2018   Fibromyalgia affecting multiple sites 04/22/2018   Chronic pain disorder 04/22/2018   Constipation due to pain medication 04/30/2017   Depression 03/22/2017   Trochanteric bursitis of left hip 03/13/2017   Neck pain 11/13/2016   Left shoulder pain 11/13/2016   Chronic bilateral low back pain 11/13/2016   History of colonic polyps 06/20/2016  IBS (irritable bowel syndrome) 06/20/2016   Epigastric pain 06/20/2016   Rhinitis, chronic 04/13/2016   Otalgia of both ears 04/13/2016   Arthralgia of right temporomandibular joint 04/13/2016   Bilateral carpal tunnel syndrome 11/22/2015   Tobacco abuse 11/22/2015   Allergic rhinitis 11/01/2015   Gastroesophageal reflux disease 11/01/2015   LLQ pain 06/18/2015   Bloating 06/18/2015   Pain with urination 02/19/2015   Moody 01/22/2015   Pelvic pain in female 01/13/2015    Dysuria 01/13/2015   Hot flashes due to surgical menopause 01/13/2015   Hypertension 01/13/2015   Chronic migraine w/o aura, not intractable, w/o stat migr 01/13/2015   Psoriasis 01/13/2015   Arthritis 01/13/2015   Fibromyalgia 01/13/2015   COPD mixed type (HCC) 01/13/2015   Hyperlipidemia 01/13/2015   Abnormal nuclear stress test 01/07/2014   Abdominal pain 04/16/2013   Pain in joint involving multiple sites 02/20/2013   Head trauma 02/20/2013   Past Medical History:  Diagnosis Date   Anxiety    Aphasia    Arthritis    COPD (chronic obstructive pulmonary disease) (HCC)    CRPS (complex regional pain syndrome type I)    DDD (degenerative disc disease), cervical    Degenerative disc disease at L5-S1 level    Fibromyalgia    GERD (gastroesophageal reflux disease)    IBS (irritable bowel syndrome)    Migraines    Moody 01/22/2015   Psoriasis    Scoliosis    Spondylolysis    Past Surgical History:  Procedure Laterality Date   ABDOMINAL HYSTERECTOMY     BIOPSY  09/14/2016   Procedure: BIOPSY;  Surgeon: Lamar CHRISTELLA Hollingshead, MD;  Location: [facility];  Service: Endoscopy;;  gastric    CARDIAC CATHETERIZATION  2015   patient reported it to be normal. I was dying in pain during procedure.    CARPAL TUNNEL RELEASE Right    CESAREAN SECTION     CHOLECYSTECTOMY     COLONOSCOPY  06/2014   Oliva DeMason: normal   DILATION AND CURETTAGE OF UTERUS     ESOPHAGOGASTRODUODENOSCOPY  07/2014   Oliva Altes: Normal   ESOPHAGOGASTRODUODENOSCOPY (EGD) WITH PROPOFOL  N/A 09/14/2016   Dr. Hollingshead: Normal esophagus status post empiric dilation, erosive gastropathy with biopsies showing chronic inactive gastritis, no H pylori.   EXTERNAL FIXATION OF FINGER Left 12/01/2021   Procedure: LEFT RING DIGIT WIDGET PLACEMENT;  Surgeon: Murrell Drivers, MD;  Location: [facility];  Service: Orthopedics;  Laterality: Left;   FOOT SURGERY Bilateral    removal of heel spurs   MALONEY DILATION   09/14/2016   Procedure: MALONEY DILATION;  Surgeon: Lamar CHRISTELLA Hollingshead, MD;  Location: [facility];  Service: Endoscopy;;   rotator cuff surgery Right    TONSILLECTOMY     Social History   Tobacco Use   Smoking status: Every Day    Current packs/day: 1.50    Average packs/day: 1.5 packs/day for 30.0 years (45.0 ttl pk-yrs)    Types: Cigarettes   Smokeless tobacco: Never  Vaping Use   Vaping status: Former  Substance Use Topics   Alcohol use: No    Comment: rarely   Drug use: Yes    Types: Marijuana   Social History   Socioeconomic History   Marital status: Legally Separated    Spouse name: Not on file   Number of children: 2   Years of education: GED   Highest education level: GED or equivalent  Occupational History   Occupation: Disabled  Tobacco  Use   Smoking status: Every Day    Current packs/day: 1.50    Average packs/day: 1.5 packs/day for 30.0 years (45.0 ttl pk-yrs)    Types: Cigarettes   Smokeless tobacco: Never  Vaping Use   Vaping status: Former  Substance and Sexual Activity   Alcohol use: No    Comment: rarely   Drug use: Yes    Types: Marijuana   Sexual activity: Not Currently    Birth control/protection: Surgical    Comment: hyst  Other Topics Concern   Not on file  Social History Narrative   Caffeine use: Drinks coffee (2 cups per day), tea/soda- 2-3 cups per day   Right handed   Lives alone.   Lost oldest sister 07/2021 due to cardiac issues. Youngest sister died October 08, 2021 due to unknown causes.    Social Drivers of Corporate investment banker Strain: Low Risk  (01/15/2024)   Overall Financial Resource Strain (CARDIA)    Difficulty of Paying Living Expenses: Not hard at all  Food Insecurity: No Food Insecurity (01/15/2024)   Hunger Vital Sign    Worried About Running Out of Food in the Last Year: Never true    Ran Out of Food in the Last Year: Never true  Transportation Needs: No Transportation Needs (01/15/2024)   PRAPARE - Therapist, art (Medical): No    Lack of Transportation (Non-Medical): No  Physical Activity: Sufficiently Active (01/15/2024)   Exercise Vital Sign    Days of Exercise per Week: 3 days    Minutes of Exercise per Session: 60 min  Stress: Stress Concern Present (01/15/2024)   [employer] of Occupational Health - Occupational Stress Questionnaire    Feeling of Stress : Rather much  Social Connections: Socially Isolated (01/15/2024)   Social Connection and Isolation Panel    Frequency of Communication with Friends and Family: More than three times a week    Frequency of Social Gatherings with Friends and Family: More than three times a week    Attends Religious Services: Never    Database administrator or Organizations: No    Attends Banker Meetings: Never    Marital Status: Separated  Intimate Partner Violence: Not At Risk (01/15/2024)   Humiliation, Afraid, Rape, and Kick questionnaire    Fear of Current or Ex-Partner: No    Emotionally Abused: No    Physically Abused: No    Sexually Abused: No   Family Status  Relation Name Status   Mother  Alive   Father  Alive   Sister  Deceased   Sister  Deceased   Brother  Deceased   MGM  Alive   MGF  Deceased   PGM  Deceased   PGF  Deceased   Daughter  Alive   Son  Alive  No partnership data on file   Family History  Problem Relation Age of Onset   COPD Mother    Diabetes Mother    Osteoarthritis Mother    Atrial fibrillation Mother    Healthy Father    Atrial fibrillation Father    Lupus Sister    CAD Sister 30       CABG   Diabetes Sister    Migraines Sister    Factor V Leiden deficiency Sister    Arthritis Sister    Psoriasis Sister    Early death Brother        pneumonia   Cancer Maternal Grandmother 86  colon   Arthritis Maternal Grandmother    Diabetes Maternal Grandmother    Dementia Maternal Grandmother    Other Maternal Grandfather        brain tumor   Breast cancer Paternal  Grandmother    Cancer Paternal Grandmother        breast   Cancer Paternal Grandfather        lung   Other Paternal Grandfather        brain aneurysm   Asthma Daughter    Migraines Daughter    Asthma Son    Allergies  Allergen Reactions   Rosuvastatin Calcium Swelling    Ends up in the hospital   Statins Swelling    Ends up in the hospital   Wellbutrin [Bupropion]     suicidal   Hctz [Hydrochlorothiazide] Nausea And Vomiting and Rash   Hydrocodone -Acetaminophen  Rash   Oxycodone  Rash   Penicillins Nausea And Vomiting and Rash    Has patient had a PCN reaction causing immediate rash, facial/tongue/throat swelling, SOB or lightheadedness with hypotension: Yes Has patient had a PCN reaction causing severe rash involving mucus membranes or skin necrosis: No Has patient had a PCN reaction that required hospitalization No Has patient had a PCN reaction occurring within the last 10 years: No If all of the above answers are NO, then may proceed with Cephalosporin use.       Patient Care Team: Kris Burd, Rock HERO, FNP as PCP - General (Family Medicine) Lavona Agent, MD as PCP - Cardiology (Cardiology) Shaaron Lamar HERO, MD as Consulting Physician (Gastroenterology) Eda Baxter, MD as Referring Physician (Dermatology) Severa Rock HERO, FNP as Nurse Practitioner (Family Medicine)   Outpatient Medications Prior to Visit  Medication Sig   albuterol  (VENTOLIN  HFA) 108 (90 Base) MCG/ACT inhaler USE 2 PUFFS BY MOUTH EVERY 4 HOURS AS NEEDED FOR WHEEZING OR SHORTNESS OF BREATH   amitriptyline  (ELAVIL ) 25 MG tablet TAKE 2 TABLETS BY MOUTH AT BEDTIME   Budeson-Glycopyrrol-Formoterol  (BREZTRI  AEROSPHERE) 160-9-4.8 MCG/ACT AERO Inhale 2 puffs into the lungs 2 (two) times daily.   celecoxib  (CELEBREX ) 200 MG capsule TAKE 1 CAPSULE BY MOUTH EVERY DAY   cetirizine  (ZYRTEC ) 10 MG tablet Take 1 tablet (10 mg total) by mouth daily.   COSENTYX SENSOREADY, 300 MG, 150 MG/ML SOAJ    fish oil-omega-3  fatty acids 1000 MG capsule Take 2 capsules (2 g total) by mouth daily.   fluticasone  (FLONASE ) 50 MCG/ACT nasal spray Administer 2 sprays in each nostril daily.   gabapentin  (NEURONTIN ) 800 MG tablet TAKE 1 TABLET (800 MG TOTAL) BY MOUTH 4 (FOUR) TIMES DAILY. (NEEDS TO BE SEEN BEFORE NEXT REFILL)   ipratropium-albuterol  (DUONEB) 0.5-2.5 (3) MG/3ML SOLN INHALE 3 ML (CONTENTS OF 1 VIAL) BY NEBULIZER EVERY 6 HOURS AS NEEDED   nystatin  ointment (MYCOSTATIN ) Apply 1 application topically 2 (two) times daily as needed.   omeprazole  (PRILOSEC) 40 MG capsule TAKE 1 CAPSULE BY MOUTH EVERY DAY   triamcinolone  cream (KENALOG ) 0.1 %    triamcinolone  ointment (KENALOG ) 0.5 % Apply topically 2 (two) times daily.   [DISCONTINUED] Atogepant  (QULIPTA ) 30 MG TABS Take 1 tablet (30 mg total) by mouth daily.   [DISCONTINUED] Fremanezumab -vfrm (AJOVY ) 225 MG/1.5ML SOSY INJECT 1 SYRINGE UNDER THE SKIN EVERY 28 DAYS (Patient not taking: Reported on 01/15/2024)   [DISCONTINUED] hydrOXYzine  (VISTARIL ) 50 MG capsule Take 1 capsule (50 mg total) by mouth 3 (three) times daily as needed.   [DISCONTINUED] lisinopril  (ZESTRIL ) 5 MG tablet Take 1 tablet (5 mg  total) by mouth daily. (Patient not taking: Reported on 01/15/2024)   [DISCONTINUED] Semaglutide -Weight Management 0.25 MG/0.5ML SOAJ Inject 0.25 mg into the skin once a week. (Patient not taking: Reported on 01/15/2024)   No facility-administered medications prior to visit.    ROS per HPI      Objective:     BP 118/72   Pulse 93   Temp (!) 97.5 F (36.4 C)   Ht 5' 2 (1.575 m)   Wt 202 lb 3.2 oz (91.7 kg)   SpO2 94%   BMI 36.98 kg/m  BP Readings from Last 3 Encounters:  04/30/24 118/72  01/15/24 (!) 138/90  12/27/23 (!) 138/90   Wt Readings from Last 3 Encounters:  04/30/24 202 lb 3.2 oz (91.7 kg)  01/15/24 201 lb (91.2 kg)  12/27/23 201 lb 3.2 oz (91.3 kg)   SpO2 Readings from Last 3 Encounters:  04/30/24 94%  12/27/23 92%  10/19/23 96%       Physical Exam Vitals and nursing note reviewed.  Constitutional:      General: She is not in acute distress.    Appearance: Normal appearance. She is well-developed and well-groomed. She is obese. She is not ill-appearing, toxic-appearing or diaphoretic.  HENT:     Head: Normocephalic and atraumatic.     Jaw: There is normal jaw occlusion.     Right Ear: Hearing, tympanic membrane, ear canal and external ear normal.     Left Ear: Hearing, tympanic membrane, ear canal and external ear normal.     Nose: Nose normal.     Mouth/Throat:     Lips: Pink.     Mouth: Mucous membranes are moist.     Pharynx: Oropharynx is clear. Uvula midline.  Eyes:     General: Lids are normal.     Extraocular Movements: Extraocular movements intact.     Conjunctiva/sclera: Conjunctivae normal.     Pupils: Pupils are equal, round, and reactive to light.  Neck:     Thyroid : No thyroid  mass, thyromegaly or thyroid  tenderness.     Vascular: No carotid bruit or JVD.     Trachea: Trachea and phonation normal.  Cardiovascular:     Rate and Rhythm: Normal rate and regular rhythm.     Chest Wall: PMI is not displaced.     Pulses: Normal pulses.     Heart sounds: Normal heart sounds. No murmur heard.    No friction rub. No gallop.  Pulmonary:     Effort: Pulmonary effort is normal. No respiratory distress.     Breath sounds: Normal breath sounds. No wheezing.  Abdominal:     General: Bowel sounds are normal. There is no distension or abdominal bruit.     Palpations: Abdomen is soft. There is no hepatomegaly or splenomegaly.     Tenderness: There is no abdominal tenderness. There is no right CVA tenderness or left CVA tenderness.     Hernia: No hernia is present.  Musculoskeletal:        General: Normal range of motion.     Cervical back: Normal range of motion and neck supple.     Right lower leg: No edema.     Left lower leg: No edema.  Lymphadenopathy:     Cervical: No cervical adenopathy.  Skin:     General: Skin is warm and dry.     Capillary Refill: Capillary refill takes less than 2 seconds.     Coloration: Skin is not cyanotic, jaundiced or pale.  Findings: No rash.  Neurological:     General: No focal deficit present.     Mental Status: She is alert and oriented to person, place, and time.     Sensory: Sensation is intact.     Motor: Motor function is intact.     Coordination: Coordination is intact.     Gait: Gait is intact.     Deep Tendon Reflexes: Reflexes are normal and symmetric.  Psychiatric:        Attention and Perception: Attention and perception normal.        Mood and Affect: Mood and affect normal.        Speech: Speech normal.        Behavior: Behavior normal. Behavior is cooperative.        Thought Content: Thought content normal.        Cognition and Memory: Cognition and memory normal.        Judgment: Judgment normal.       Last CBC Lab Results  Component Value Date   WBC 8.7 12/27/2023   HGB 17.4 (H) 12/27/2023   HCT 51.9 (H) 12/27/2023   MCV 94 12/27/2023   MCH 31.6 12/27/2023   RDW 12.4 12/27/2023   PLT 306 12/27/2023   Last metabolic panel Lab Results  Component Value Date   GLUCOSE 96 12/27/2023   NA 139 12/27/2023   K 4.6 12/27/2023   CL 98 12/27/2023   CO2 22 12/27/2023   BUN 18 12/27/2023   CREATININE 0.84 12/27/2023   EGFR 84 12/27/2023   CALCIUM 9.9 12/27/2023   PROT 6.9 12/27/2023   ALBUMIN 4.7 12/27/2023   LABGLOB 2.2 12/27/2023   AGRATIO 2.2 03/07/2023   BILITOT 0.4 12/27/2023   ALKPHOS 120 12/27/2023   AST 18 12/27/2023   ALT 15 12/27/2023   ANIONGAP 9 11/24/2020   Last lipids Lab Results  Component Value Date   CHOL 246 (H) 10/19/2023   HDL 69 10/19/2023   LDLCALC 156 (H) 10/19/2023   TRIG 120 10/19/2023   CHOLHDL 3.6 10/19/2023   Last hemoglobin A1c Lab Results  Component Value Date   HGBA1C 5.4 03/07/2023   Last thyroid  functions Lab Results  Component Value Date   TSH 1.680 12/27/2023    T4TOTAL 7.9 12/27/2023   Last vitamin D  Lab Results  Component Value Date   VD25OH 34.3 12/27/2023   Last vitamin B12 and Folate Lab Results  Component Value Date   VITAMINB12 285 12/27/2023   FOLATE 4.9 12/27/2023        Assessment & Plan:    Routine Health Maintenance and Physical Exam  Immunization History  Administered Date(s) Administered   Influenza Split 06/20/2011   Influenza,inj,Quad PF,6+ Mos 08/24/2017, 06/20/2018   Influenza-Unspecified 06/10/2013, 11/01/2015   PPD Test 03/22/2018   Pneumococcal Polysaccharide-23 06/20/2011, 08/24/2017   Td 06/10/2004   Tdap 06/14/2017    Health Maintenance  Topic Date Due   HIV Screening  Never done   Hepatitis C Screening  Never done   Hepatitis B Vaccines (1 of 3 - 19+ 3-dose series) Never done   Pneumococcal Vaccine: 19-49 Years (2 of 2 - PCV) 08/24/2018   Pneumococcal Vaccine: 50+ Years (2 of 2 - PCV) 08/24/2018   MAMMOGRAM  01/26/2023   Lung Cancer Screening  02/04/2023   Colonoscopy  07/14/2024   Zoster Vaccines- Shingrix (1 of 2) 07/31/2024 (Originally 01/23/1991)   INFLUENZA VACCINE  12/23/2024 (Originally 04/25/2024)   COVID-19 Vaccine (1) 01/30/2025 (Originally [birth year]1977)  Medicare Annual Wellness (AWV)  01/14/2025   DTaP/Tdap/Td (3 - Td or Tdap) 06/15/2027   HPV VACCINES  Aged Out   Meningococcal B Vaccine  Aged Out    Discussed health benefits of physical activity, and encouraged her to engage in regular exercise appropriate for her age and condition.  Problem List Items Addressed This Visit       Cardiovascular and Mediastinum   Hypertension   Relevant Orders   CBC with Differential/Platelet   CMP14+EGFR   Lipid panel   Thyroid  Panel With TSH     Other   Hyperlipidemia   Relevant Orders   CMP14+EGFR   Lipid panel   Anxiety   Relevant Orders   Thyroid  Panel With TSH   Ambulatory referral to Psychiatry   Vitamin D  deficiency   Relevant Orders   CMP14+EGFR   Vitamin D , 25-hydroxy   Other  Visit Diagnoses       Annual physical exam    -  Primary   Relevant Orders   CBC with Differential/Platelet   CMP14+EGFR   Lipid panel   Hepatitis C antibody   HIV antibody (with reflex)   Hepatitis B surface antibody,quantitative   MM DIGITAL SCREENING BILATERAL   Ambulatory Referral for Lung Cancer Screening [REF832]     Encounter for screening for HIV       Relevant Orders   HIV antibody (with reflex)     Need for hepatitis C screening test       Relevant Orders   Hepatitis C antibody     Immunity status testing       Relevant Orders   Hepatitis B surface antibody,quantitative     Encounter for screening mammogram for malignant neoplasm of breast       Relevant Orders   MM DIGITAL SCREENING BILATERAL     Mixed simple and mucopurulent chronic bronchitis (HCC)   (Chronic)     Relevant Orders   Ambulatory Referral for Lung Cancer Screening [REF832]     Urinary frequency       Relevant Orders   WET PREP FOR TRICH, YEAST, CLUE   Urinalysis, Routine w reflex microscopic   Urine Culture     Vaginal discharge       Relevant Orders   WET PREP FOR TRICH, YEAST, CLUE   Urinalysis, Routine w reflex microscopic   Urine Culture     Non-recurrent acute serous otitis media of right ear         BMI 36.0-36.9,adult       Relevant Orders   CBC with Differential/Platelet   CMP14+EGFR   Lipid panel   Thyroid  Panel With TSH   Bayer DCA Hb A1c Waived         Blurred vision and dizziness Experiencing dizziness and blurred vision for about a month, possibly related to fluid behind the right ear or blood pressure changes. History of occipital neuralgia. - Recommend eye examination - Advise use of nasal spray   Otitis media with effusion, right ear Fluid behind the right ear may contribute to dizziness and vertigo. - Advise use of nasal spray   Hypertension Elevated blood pressure initially, normalized on repeat measurement. Stopped antihypertensive medication due to chest pain. No  recent home monitoring. - Provide blood pressure log for home monitoring - Follow up in 6-8 weeks with blood pressure readings - Discuss potential need for medication adjustment based on home readings  Anxiety and irritability Symptoms have worsened. Last psychiatric evaluation in 2009. Interested in  psychiatric referral. - Refer to psychiatry for evaluation and management  Obesity Discussed weight management options. Insurance does not cover recommended medication (Contrave).  Nicotine dependence Continues to smoke. Discussed lung cancer screening.  Genitourinary symptoms: intermittent bladder pressure and pelvic pain Intermittent bladder pressure and pelvic pain without associated odor, itching, or burning. Differential includes possible yeast infection or bladder prolapse. - Perform urine analysis and wet prep - Investigate further if initial tests are inconclusive  Adult Wellness Visit Routine adult wellness visit. Discussed various health maintenance topics including screenings and vaccinations. - Order mammogram - Order lung cancer screening - Update lab work - Administer pneumonia vaccine - Discuss shingles vaccine       Return in about 6 weeks (around 06/11/2024) for HTN.     Rosaline Bruns, FNP

## 2024-05-01 ENCOUNTER — Ambulatory Visit: Payer: Self-pay | Admitting: Family Medicine

## 2024-05-01 ENCOUNTER — Other Ambulatory Visit: Payer: Self-pay | Admitting: Family Medicine

## 2024-05-01 DIAGNOSIS — N3090 Cystitis, unspecified without hematuria: Secondary | ICD-10-CM

## 2024-05-01 DIAGNOSIS — F419 Anxiety disorder, unspecified: Secondary | ICD-10-CM

## 2024-05-01 LAB — HEPATITIS B SURFACE ANTIBODY, QUANTITATIVE: Hepatitis B Surf Ab Quant: <3.5 m[IU]/mL — ABNORMAL LOW

## 2024-05-01 LAB — CBC WITH DIFFERENTIAL/PLATELET
Basophils Absolute: 0.1 x10E3/uL (ref 0.0–0.2)
Basos: 1 %
EOS (ABSOLUTE): 0.3 x10E3/uL (ref 0.0–0.4)
Eos: 5 %
Hematocrit: 49 % — ABNORMAL HIGH (ref 34.0–46.6)
Hemoglobin: 16.1 g/dL — ABNORMAL HIGH (ref 11.1–15.9)
Immature Grans (Abs): 0 x10E3/uL (ref 0.0–0.1)
Immature Granulocytes: 0 %
Lymphocytes Absolute: 2.1 x10E3/uL (ref 0.7–3.1)
Lymphs: 37 %
MCH: 31.2 pg (ref 26.6–33.0)
MCHC: 32.9 g/dL (ref 31.5–35.7)
MCV: 95 fL (ref 79–97)
Monocytes Absolute: 0.3 x10E3/uL (ref 0.1–0.9)
Monocytes: 5 %
Neutrophils Absolute: 2.9 x10E3/uL (ref 1.4–7.0)
Neutrophils: 52 %
Platelets: 231 x10E3/uL (ref 150–450)
RBC: 5.16 x10E6/uL (ref 3.77–5.28)
RDW: 12.5 % (ref 11.7–15.4)
WBC: 5.7 x10E3/uL (ref 3.4–10.8)

## 2024-05-01 LAB — CMP14+EGFR
ALT: 10 IU/L (ref 0–32)
AST: 20 IU/L (ref 0–40)
Albumin: 4.4 g/dL (ref 3.8–4.9)
Alkaline Phosphatase: 109 IU/L (ref 44–121)
BUN/Creatinine Ratio: 10 (ref 9–23)
BUN: 8 mg/dL (ref 6–24)
Bilirubin Total: 0.4 mg/dL (ref 0.0–1.2)
CO2: 21 mmol/L (ref 20–29)
Calcium: 9 mg/dL (ref 8.7–10.2)
Chloride: 103 mmol/L (ref 96–106)
Creatinine, Ser: 0.79 mg/dL (ref 0.57–1.00)
Globulin, Total: 1.8 g/dL (ref 1.5–4.5)
Glucose: 84 mg/dL (ref 70–99)
Potassium: 4.3 mmol/L (ref 3.5–5.2)
Sodium: 142 mmol/L (ref 134–144)
Total Protein: 6.2 g/dL (ref 6.0–8.5)
eGFR: 90 mL/min/1.73 (ref >59)

## 2024-05-01 LAB — THYROID PANEL WITH TSH
Free Thyroxine Index: 1.6 (ref 1.2–4.9)
T3 Uptake Ratio: 21 % — ABNORMAL LOW (ref 24–39)
T4, Total: 7.6 ug/dL (ref 4.5–12.0)
TSH: 0.917 u[IU]/mL (ref 0.450–4.500)

## 2024-05-01 LAB — HEPATITIS C ANTIBODY: Hep C Virus Ab: NONREACTIVE

## 2024-05-01 LAB — LIPID PANEL
Chol/HDL Ratio: 4.6 ratio — ABNORMAL HIGH (ref 0.0–4.4)
Cholesterol, Total: 234 mg/dL — ABNORMAL HIGH (ref 100–199)
HDL: 51 mg/dL (ref >39)
LDL Chol Calc (NIH): 153 mg/dL — ABNORMAL HIGH (ref 0–99)
Triglycerides: 168 mg/dL — ABNORMAL HIGH (ref 0–149)
VLDL Cholesterol Cal: 30 mg/dL (ref 5–40)

## 2024-05-01 LAB — VITAMIN D 25 HYDROXY (VIT D DEFICIENCY, FRACTURES): Vit D, 25-Hydroxy: 27.8 ng/mL — ABNORMAL LOW (ref 30.0–100.0)

## 2024-05-01 LAB — HIV ANTIBODY (ROUTINE TESTING W REFLEX): HIV Screen 4th Generation wRfx: NONREACTIVE

## 2024-05-04 LAB — URINE CULTURE

## 2024-05-04 MED ORDER — SULFAMETHOXAZOLE-TRIMETHOPRIM 800-160 MG PO TABS
1.0000 | ORAL_TABLET | Freq: Two times a day (BID) | ORAL | 0 refills | Status: AC
Start: 2024-05-04 — End: 2024-05-11

## 2024-05-05 NOTE — Telephone Encounter (Signed)
 Patient aware and verbalized understanding. Follow up lab orders entered. Patient will call back to schedule lab appt.

## 2024-05-07 ENCOUNTER — Other Ambulatory Visit: Payer: Self-pay | Admitting: Family Medicine

## 2024-05-07 DIAGNOSIS — J449 Chronic obstructive pulmonary disease, unspecified: Secondary | ICD-10-CM

## 2024-05-12 ENCOUNTER — Other Ambulatory Visit: Payer: Self-pay | Admitting: Family Medicine

## 2024-05-12 ENCOUNTER — Ambulatory Visit
Admission: RE | Admit: 2024-05-12 | Discharge: 2024-05-12 | Disposition: A | Discharge status: Home / Self Care | Source: Ambulatory Visit | Attending: Acute Care | Admitting: Acute Care

## 2024-05-12 DIAGNOSIS — J449 Chronic obstructive pulmonary disease, unspecified: Secondary | ICD-10-CM

## 2024-05-12 DIAGNOSIS — Z122 Encounter for screening for malignant neoplasm of respiratory organs: Secondary | ICD-10-CM

## 2024-05-12 DIAGNOSIS — Z87891 Personal history of nicotine dependence: Secondary | ICD-10-CM

## 2024-05-12 DIAGNOSIS — F17210 Nicotine dependence, cigarettes, uncomplicated: Secondary | ICD-10-CM

## 2024-05-20 ENCOUNTER — Encounter: Payer: Self-pay | Admitting: *Deleted

## 2024-05-23 ENCOUNTER — Other Ambulatory Visit

## 2024-05-23 ENCOUNTER — Ambulatory Visit: Payer: Self-pay | Admitting: Family Medicine

## 2024-05-23 DIAGNOSIS — N3090 Cystitis, unspecified without hematuria: Secondary | ICD-10-CM

## 2024-05-23 LAB — MICROSCOPIC EXAMINATION
RBC, Urine: NONE SEEN /HPF (ref 0–2)
Renal Epithel, UA: NONE SEEN /HPF
WBC, UA: NONE SEEN /HPF (ref 0–5)
Yeast, UA: NONE SEEN

## 2024-05-23 LAB — URINALYSIS, COMPLETE
Bilirubin, UA: NEGATIVE
Glucose, UA: NEGATIVE
Leukocytes,UA: NEGATIVE
Nitrite, UA: NEGATIVE
Protein,UA: NEGATIVE
RBC, UA: NEGATIVE
Specific Gravity, UA: 1.015 (ref 1.005–1.030)
Urobilinogen, Ur: 0.2 mg/dL (ref 0.2–1.0)
pH, UA: 7 (ref 5.0–7.5)

## 2024-05-27 ENCOUNTER — Other Ambulatory Visit: Payer: Self-pay | Admitting: Acute Care

## 2024-05-27 DIAGNOSIS — Z122 Encounter for screening for malignant neoplasm of respiratory organs: Secondary | ICD-10-CM

## 2024-05-27 DIAGNOSIS — F17210 Nicotine dependence, cigarettes, uncomplicated: Secondary | ICD-10-CM

## 2024-05-27 DIAGNOSIS — Z87891 Personal history of nicotine dependence: Secondary | ICD-10-CM

## 2024-06-02 DIAGNOSIS — L400 Psoriasis vulgaris: Secondary | ICD-10-CM | POA: Diagnosis not present

## 2024-06-08 ENCOUNTER — Other Ambulatory Visit: Payer: Self-pay | Admitting: Family Medicine

## 2024-06-08 DIAGNOSIS — J449 Chronic obstructive pulmonary disease, unspecified: Secondary | ICD-10-CM

## 2024-06-11 ENCOUNTER — Ambulatory Visit: Admitting: Family Medicine

## 2024-06-13 ENCOUNTER — Ambulatory Visit: Admitting: Family Medicine

## 2024-06-13 ENCOUNTER — Encounter: Payer: Self-pay | Admitting: Family Medicine

## 2024-06-13 VITALS — BP 122/75 | HR 79 | Temp 98.0°F | Ht 62.0 in | Wt 201.2 lb

## 2024-06-13 DIAGNOSIS — E782 Mixed hyperlipidemia: Secondary | ICD-10-CM

## 2024-06-13 DIAGNOSIS — I10 Essential (primary) hypertension: Secondary | ICD-10-CM | POA: Diagnosis not present

## 2024-06-13 DIAGNOSIS — E559 Vitamin D deficiency, unspecified: Secondary | ICD-10-CM | POA: Diagnosis not present

## 2024-06-13 NOTE — Patient Instructions (Signed)
 Goal BP:  Less than 130/80  Take your medications faithfully as prescribed. Maintain a healthy weight. Get at least 150 minutes of aerobic exercise per week. Minimize salt intake, less than 2000 mg per day. Minimize alcohol intake.  DASH Eating Plan DASH stands for Dietary Approaches to Stop Hypertension. The DASH eating plan is a healthy eating plan that has been shown to reduce high blood pressure (hypertension). Additional health benefits may include reducing the risk of type 2 diabetes mellitus, heart disease, and stroke. The DASH eating plan may also help with weight loss.  WHAT DO I NEED TO KNOW ABOUT THE DASH EATING PLAN? For the DASH eating plan, you will follow these general guidelines: Choose foods with a percent daily value for sodium of less than 5% (as listed on the food label). Use salt-free seasonings or herbs instead of table salt or sea salt. Check with your health care provider or pharmacist before using salt substitutes. Eat lower-sodium products, often labeled as lower sodium or no salt added. Eat fresh foods. Eat more vegetables, fruits, and low-fat dairy products. Choose whole grains. Look for the word whole as the first word in the ingredient list. Choose fish and skinless chicken or [place] more often than red meat. Limit fish, poultry, and meat to 6 oz (170 g) each day. Limit sweets, desserts, sugars, and sugary drinks. Choose heart-healthy fats. Limit cheese to 1 oz (28 g) per day. Eat more home-cooked food and less restaurant, buffet, and fast food. Limit fried foods. Cook foods using methods other than frying. Limit canned vegetables. If you do use them, rinse them well to decrease the sodium. When eating at a restaurant, ask that your food be prepared with less salt, or no salt if possible.  WHAT FOODS CAN I EAT? Seek help from a dietitian for individual calorie needs.  Grains Whole grain or whole wheat bread. Hoshino rice. Whole grain or whole  wheat pasta. Quinoa, bulgur, and whole grain cereals. Low-sodium cereals. Corn or whole wheat flour tortillas. Whole grain cornbread. Whole grain crackers. Low-sodium crackers.  Vegetables Fresh or frozen vegetables (raw, steamed, roasted, or grilled). Low-sodium or reduced-sodium tomato and vegetable juices. Low-sodium or reduced-sodium tomato sauce and paste. Low-sodium or reduced-sodium canned vegetables.   Fruits All fresh, canned (in natural juice), or frozen fruits.  Meat and Other Protein Products Ground beef (85% or leaner), grass-fed beef, or beef trimmed of fat. Skinless chicken or [place]. Ground chicken or [place]. Pork trimmed of fat. All fish and seafood. Eggs. Dried beans, peas, or lentils. Unsalted nuts and seeds. Unsalted canned beans.  Dairy Low-fat dairy products, such as skim or 1% milk, 2% or reduced-fat cheeses, low-fat ricotta or cottage cheese, or plain low-fat yogurt. Low-sodium or reduced-sodium cheeses.  Fats and Oils Tub margarines without trans fats. Light or reduced-fat mayonnaise and salad dressings (reduced sodium). Avocado. Safflower, olive, or canola oils. Natural peanut or almond butter.  Other Unsalted popcorn and pretzels. The items listed above may not be a complete list of recommended foods or beverages. Contact your dietitian for more options.  WHAT FOODS ARE NOT RECOMMENDED?  Grains White bread. White pasta. White rice. Refined cornbread. Bagels and croissants. Crackers that contain trans fat.  Vegetables Creamed or fried vegetables. Vegetables in a cheese sauce. Regular canned vegetables. Regular canned tomato sauce and paste. Regular tomato and vegetable juices.  Fruits Dried fruits. Canned fruit in light or heavy syrup. Fruit juice.  Meat and Other Protein Products Fatty cuts of meat. Ribs,  chicken wings, bacon, sausage, bologna, salami, chitterlings, fatback, hot dogs, bratwurst, and packaged luncheon meats. Salted nuts and seeds. Canned  beans with salt.  Dairy Whole or 2% milk, cream, half-and-half, and cream cheese. Whole-fat or sweetened yogurt. Full-fat cheeses or blue cheese. Nondairy creamers and whipped toppings. Processed cheese, cheese spreads, or cheese curds.  Condiments Onion and garlic salt, seasoned salt, table salt, and sea salt. Canned and packaged gravies. Worcestershire sauce. Tartar sauce. Barbecue sauce. Teriyaki sauce. Soy sauce, including reduced sodium. Steak sauce. Fish sauce. Oyster sauce. Cocktail sauce. Horseradish. Ketchup and mustard. Meat flavorings and tenderizers. Bouillon cubes. Hot sauce. Tabasco sauce. Marinades. Taco seasonings. Relishes.  Fats and Oils Butter, stick margarine, lard, shortening, ghee, and bacon fat. Coconut, palm kernel, or palm oils. Regular salad dressings.  Other Pickles and olives. Salted popcorn and pretzels.  The items listed above may not be a complete list of foods and beverages to avoid. Contact your dietitian for more information.  WHERE CAN I FIND MORE INFORMATION? National Heart, Lung, and Blood Institute: [email or web address] Document Released: 08/31/2011 Document Revised: 01/26/2014 Document Reviewed: 07/16/2013 [facility] Patient Information 2015 [place], [place]. This information is not intended to replace advice given to you by your health care provider. Make sure you discuss any questions you have with your health care provider.   I think that you would greatly benefit from seeing a nutritionist.  If you are interested, please call Dr. Wonda at [phone] to schedule an appointment.

## 2024-06-13 NOTE — Progress Notes (Signed)
 Subjective:  Patient ID: Judy Evans, female    DOB: [birth year], 53 y.o.   MRN: [id number]  Patient Care Team: Severa Rock HERO, FNP as PCP - General (Family Medicine) Lavona Agent, MD as PCP - Cardiology (Cardiology) Shaaron Lamar HERO, MD as Consulting Physician (Gastroenterology) Eda Baxter, MD as Referring Physician (Dermatology) Emili Mcloughlin, Rock HERO, FNP as Nurse Practitioner (Family Medicine)   Chief Complaint:  Hypertension (6 week follow up )   HPI: Judy Evans is a 53 y.o. female presenting on 06/13/2024 for Hypertension (6 week follow up )   Judy Evans is a 53 year old female who presents for a follow-up on hypertension management.  She has been monitoring her blood pressure at home, with readings consistently within the normal range. No chest pain or leg swelling is noted.  She experiences occasional headaches, which she attributes to her history of migraines.  She reports that she has not been eating much, has a decreased appetite, and is mostly eating at home, including salads and protein, and is not eating fast food much. She reports a decreased appetite.  Recent lab work showed slightly elevated cholesterol levels and low vitamin D . She is taking vitamin D  supplements and fish oil to address these issues.          Relevant past medical, surgical, family, and social history reviewed and updated as indicated.  Allergies and medications reviewed and updated. Data reviewed: Chart in Epic.   Past Medical History:  Diagnosis Date   Anxiety    Aphasia    Arthritis    COPD (chronic obstructive pulmonary disease) (HCC)    CRPS (complex regional pain syndrome type I)    DDD (degenerative disc disease), cervical    Degenerative disc disease at L5-S1 level    Fibromyalgia    GERD (gastroesophageal reflux disease)    IBS (irritable bowel syndrome)    Migraines    Moody 01/22/2015   Psoriasis    Scoliosis    Spondylolysis     Past  Surgical History:  Procedure Laterality Date   ABDOMINAL HYSTERECTOMY     BIOPSY  09/14/2016   Procedure: BIOPSY;  Surgeon: Lamar HERO Shaaron, MD;  Location: [facility];  Service: Endoscopy;;  gastric    CARDIAC CATHETERIZATION  2015   patient reported it to be normal. I was dying in pain during procedure.    CARPAL TUNNEL RELEASE Right    CESAREAN SECTION     CHOLECYSTECTOMY     COLONOSCOPY  06/2014   Oliva DeMason: normal   DILATION AND CURETTAGE OF UTERUS     ESOPHAGOGASTRODUODENOSCOPY  07/2014   Oliva Altes: Normal   ESOPHAGOGASTRODUODENOSCOPY (EGD) WITH PROPOFOL  N/A 09/14/2016   Dr. Shaaron: Normal esophagus status post empiric dilation, erosive gastropathy with biopsies showing chronic inactive gastritis, no H pylori.   EXTERNAL FIXATION OF FINGER Left 12/01/2021   Procedure: LEFT RING DIGIT WIDGET PLACEMENT;  Surgeon: Murrell Drivers, MD;  Location: [facility];  Service: Orthopedics;  Laterality: Left;   FOOT SURGERY Bilateral    removal of heel spurs   MALONEY DILATION  09/14/2016   Procedure: MALONEY DILATION;  Surgeon: Lamar HERO Shaaron, MD;  Location: [facility];  Service: Endoscopy;;   rotator cuff surgery Right    TONSILLECTOMY      Social History   Socioeconomic History   Marital status: Legally Separated    Spouse name: Not on file   Number of children: 2  Years of education: GED   Highest education level: GED or equivalent  Occupational History   Occupation: Disabled  Tobacco Use   Smoking status: Every Day    Current packs/day: 1.50    Average packs/day: 1.5 packs/day for 30.0 years (45.0 ttl pk-yrs)    Types: Cigarettes   Smokeless tobacco: Never  Vaping Use   Vaping status: Former  Substance and Sexual Activity   Alcohol use: No    Comment: rarely   Drug use: Yes    Types: Marijuana   Sexual activity: Not Currently    Birth control/protection: Surgical    Comment: hyst  Other Topics Concern   Not on file  Social History Narrative    Caffeine use: Drinks coffee (2 cups per day), tea/soda- 2-3 cups per day   Right handed   Lives alone.   Lost oldest sister 07/2021 due to cardiac issues. Youngest sister died 2021/10/02 due to unknown causes.    Social Drivers of Corporate investment banker Strain: Low Risk  (01/15/2024)   Overall Financial Resource Strain (CARDIA)    Difficulty of Paying Living Expenses: Not hard at all  Food Insecurity: No Food Insecurity (01/15/2024)   Hunger Vital Sign    Worried About Running Out of Food in the Last Year: Never true    Ran Out of Food in the Last Year: Never true  Transportation Needs: No Transportation Needs (01/15/2024)   PRAPARE - Administrator, Civil Service (Medical): No    Lack of Transportation (Non-Medical): No  Physical Activity: Sufficiently Active (01/15/2024)   Exercise Vital Sign    Days of Exercise per Week: 3 days    Minutes of Exercise per Session: 60 min  Stress: Stress Concern Present (01/15/2024)   [employer] of Occupational Health - Occupational Stress Questionnaire    Feeling of Stress : Rather much  Social Connections: Socially Isolated (01/15/2024)   Social Connection and Isolation Panel    Frequency of Communication with Friends and Family: More than three times a week    Frequency of Social Gatherings with Friends and Family: More than three times a week    Attends Religious Services: Never    Database administrator or Organizations: No    Attends Banker Meetings: Never    Marital Status: Separated  Intimate Partner Violence: Not At Risk (01/15/2024)   Humiliation, Afraid, Rape, and Kick questionnaire    Fear of Current or Ex-Partner: No    Emotionally Abused: No    Physically Abused: No    Sexually Abused: No    Outpatient Encounter Medications as of 06/13/2024  Medication Sig   albuterol  (VENTOLIN  HFA) 108 (90 Base) MCG/ACT inhaler USE 2 PUFFS BY MOUTH EVERY 4 HOURS AS NEEDED FOR WHEEZING OR SHORTNESS OF BREATH    amitriptyline  (ELAVIL ) 25 MG tablet TAKE 2 TABLETS BY MOUTH AT BEDTIME   budesonide -glycopyrrolate-formoterol  (BREZTRI  AEROSPHERE) 160-9-4.8 MCG/ACT AERO inhaler INHALE 2 PUFFS INTO THE LUNGS TWICE A DAY   celecoxib  (CELEBREX ) 200 MG capsule TAKE 1 CAPSULE BY MOUTH EVERY DAY   cetirizine  (ZYRTEC ) 10 MG tablet Take 1 tablet (10 mg total) by mouth daily.   COSENTYX SENSOREADY, 300 MG, 150 MG/ML SOAJ    fish oil-omega-3 fatty acids  1000 MG capsule Take 2 capsules (2 g total) by mouth daily.   fluticasone  (FLONASE ) 50 MCG/ACT nasal spray Administer 2 sprays in each nostril daily.   gabapentin  (NEURONTIN ) 800 MG tablet TAKE 1 TABLET (800 MG TOTAL)  BY MOUTH 4 (FOUR) TIMES DAILY. (NEEDS TO BE SEEN BEFORE NEXT REFILL)   ipratropium-albuterol  (DUONEB) 0.5-2.5 (3) MG/3ML SOLN INHALE 3 ML (CONTENTS OF 1 VIAL) BY NEBULIZER EVERY 6 HOURS AS NEEDED   nystatin  ointment (MYCOSTATIN ) Apply 1 application topically 2 (two) times daily as needed.   omeprazole  (PRILOSEC) 40 MG capsule TAKE 1 CAPSULE BY MOUTH EVERY DAY   triamcinolone  cream (KENALOG ) 0.1 %    triamcinolone  ointment (KENALOG ) 0.5 % Apply topically 2 (two) times daily.   No facility-administered encounter medications on file as of 06/13/2024.    Allergies  Allergen Reactions   Rosuvastatin Calcium Swelling    Ends up in the hospital   Statins Swelling    Ends up in the hospital   Wellbutrin [Bupropion]     suicidal   Hctz [Hydrochlorothiazide] Nausea And Vomiting and Rash   Hydrocodone -Acetaminophen  Rash   Oxycodone  Rash   Penicillins Nausea And Vomiting and Rash    Has patient had a PCN reaction causing immediate rash, facial/tongue/throat swelling, SOB or lightheadedness with hypotension: Yes Has patient had a PCN reaction causing severe rash involving mucus membranes or skin necrosis: No Has patient had a PCN reaction that required hospitalization No Has patient had a PCN reaction occurring within the last 10 years: No If all of the above  answers are NO, then may proceed with Cephalosporin use.     Pertinent ROS per HPI, otherwise unremarkable      Objective:  BP 122/75   Pulse 79   Temp 98 F (36.7 C)   Ht 5' 2 (1.575 m)   Wt 201 lb 3.2 oz (91.3 kg)   SpO2 95%   BMI 36.80 kg/m    Wt Readings from Last 3 Encounters:  06/13/24 201 lb 3.2 oz (91.3 kg)  04/30/24 202 lb 3.2 oz (91.7 kg)  01/15/24 201 lb (91.2 kg)    Physical Exam Vitals and nursing note reviewed.  Constitutional:      General: She is not in acute distress.    Appearance: Normal appearance. She is obese. She is not ill-appearing, toxic-appearing or diaphoretic.  HENT:     Head: Normocephalic.     Mouth/Throat:     Mouth: Mucous membranes are moist.  Eyes:     Pupils: Pupils are equal, round, and reactive to light.  Cardiovascular:     Rate and Rhythm: Normal rate and regular rhythm.     Heart sounds: Normal heart sounds.  Pulmonary:     Effort: Pulmonary effort is normal.     Breath sounds: Normal breath sounds.  Musculoskeletal:     Right lower leg: No edema.     Left lower leg: No edema.  Skin:    General: Skin is warm and dry.     Capillary Refill: Capillary refill takes less than 2 seconds.  Neurological:     General: No focal deficit present.     Mental Status: She is alert and oriented to person, place, and time.  Psychiatric:        Mood and Affect: Mood normal.        Behavior: Behavior normal.        Thought Content: Thought content normal.        Judgment: Judgment normal.      Results for orders placed or performed in visit on 05/23/24  Microscopic Examination   Collection Time: 05/23/24  2:06 PM   Urine  Result Value Ref Range   WBC, UA None seen 0 -  5 /hpf   RBC, Urine None seen 0 - 2 /hpf   Epithelial Cells (non renal) 0-10 0 - 10 /hpf   Renal Epithel, UA None seen None seen /hpf   Bacteria, UA Few (A) None seen/Few   Yeast, UA None seen None seen  Urinalysis, Complete   Collection Time: 05/23/24   2:06 PM  Result Value Ref Range   Specific Gravity, UA 1.015 1.005 - 1.030   pH, UA 7.0 5.0 - 7.5   Color, UA Yellow Yellow   Appearance Ur Clear Clear   Leukocytes,UA Negative Negative   Protein,UA Negative Negative/Trace   Glucose, UA Negative Negative   Ketones, UA Trace (A) Negative   RBC, UA Negative Negative   Bilirubin, UA Negative Negative   Urobilinogen, Ur 0.2 0.2 - 1.0 mg/dL   Nitrite, UA Negative Negative   Microscopic Examination See below:        Pertinent labs & imaging results that were available during my care of the patient were reviewed by me and considered in my medical decision making.  Assessment & Plan:  Nija was seen today for hypertension.  Diagnoses and all orders for this visit:  Primary hypertension  Vitamin D  deficiency  Mixed hyperlipidemia       Essential hypertension Blood pressure is well-controlled with lifestyle modifications. No chest pain or leg swelling. Occasional headaches are due to migraines. - Maintain dietary modifications, including limiting sodium intake and avoiding fast foods.  Hyperlipidemia Cholesterol levels are slightly elevated. Currently managed with fish oil supplementation. - Continue fish oil supplementation. - Re-evaluate cholesterol levels in six months with lab work.  Vitamin D  deficiency Vitamin D  levels are low. Currently taking vitamin D  supplementation. - Continue vitamin D  supplementation. - Re-evaluate vitamin D  levels in six months with lab work.          Continue all other maintenance medications.  Follow up plan: Return in about 6 months (around 12/11/2024), or if symptoms worsen or fail to improve, for Lipids, Vit D.   Continue healthy lifestyle choices, including diet (rich in fruits, vegetables, and lean proteins, and low in salt and simple carbohydrates) and exercise (at least 30 minutes of moderate physical activity daily).  Educational handout given for DASH diet, HTN  The above  assessment and management plan was discussed with the patient. The patient verbalized understanding of and has agreed to the management plan. Patient is aware to call the clinic if they develop any new symptoms or if symptoms persist or worsen. Patient is aware when to return to the clinic for a follow-up visit. Patient educated on when it is appropriate to go to the emergency department.   Rosaline Bruns, FNP-C [facility] [phone]

## 2024-07-13 ENCOUNTER — Other Ambulatory Visit: Payer: Self-pay | Admitting: Family Medicine

## 2024-07-13 DIAGNOSIS — J449 Chronic obstructive pulmonary disease, unspecified: Secondary | ICD-10-CM

## 2024-07-23 ENCOUNTER — Ambulatory Visit: Admitting: Neurology

## 2024-07-23 ENCOUNTER — Encounter: Payer: Self-pay | Admitting: Neurology

## 2024-07-23 VITALS — BP 139/79 | HR 80 | Ht 62.0 in | Wt 197.8 lb

## 2024-07-23 DIAGNOSIS — G90512 Complex regional pain syndrome I of left upper limb: Secondary | ICD-10-CM | POA: Diagnosis not present

## 2024-07-23 DIAGNOSIS — M5481 Occipital neuralgia: Secondary | ICD-10-CM | POA: Diagnosis not present

## 2024-07-23 DIAGNOSIS — M542 Cervicalgia: Secondary | ICD-10-CM

## 2024-07-23 DIAGNOSIS — M25511 Pain in right shoulder: Secondary | ICD-10-CM

## 2024-07-23 DIAGNOSIS — G8929 Other chronic pain: Secondary | ICD-10-CM

## 2024-07-23 DIAGNOSIS — G43709 Chronic migraine without aura, not intractable, without status migrainosus: Secondary | ICD-10-CM

## 2024-07-23 MED ORDER — QULIPTA 30 MG PO TABS
30.0000 mg | ORAL_TABLET | Freq: Every day | ORAL | 11 refills | Status: AC
Start: 2024-07-23 — End: ?
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
## 2024-09-06 ENCOUNTER — Other Ambulatory Visit: Payer: Self-pay | Admitting: Family Medicine

## 2024-09-06 DIAGNOSIS — J301 Allergic rhinitis due to pollen: Secondary | ICD-10-CM

## 2024-09-06 DIAGNOSIS — J0140 Acute pansinusitis, unspecified: Secondary | ICD-10-CM

## 2024-09-08 ENCOUNTER — Other Ambulatory Visit: Payer: Self-pay | Admitting: Family Medicine

## 2024-09-08 DIAGNOSIS — J449 Chronic obstructive pulmonary disease, unspecified: Secondary | ICD-10-CM

## 2024-09-12 ENCOUNTER — Telehealth: Payer: Self-pay

## 2024-09-12 NOTE — Telephone Encounter (Signed)
Fyi noted.

## 2024-09-12 NOTE — Telephone Encounter (Signed)
 Copied from CRM [id number]. Topic: Clinical - Refused Triage >> Sep 12, 2024  8:48 AM Judy Evans wrote: Patient/caller voiced complaints of  Frequent urination, burning sensation, pressure   Declined having NT call her back. Wanted an appointment with the office. Nothing available until Monday. Patient stated she is going to Urgent Care.

## 2024-10-01 ENCOUNTER — Ambulatory Visit: Payer: Self-pay | Admitting: Nurse Practitioner

## 2024-10-01 ENCOUNTER — Ambulatory Visit: Admitting: Nurse Practitioner

## 2024-10-01 ENCOUNTER — Encounter: Payer: Self-pay | Admitting: Nurse Practitioner

## 2024-10-01 VITALS — BP 110/67 | HR 70 | Temp 98.0°F | Wt 201.0 lb

## 2024-10-01 DIAGNOSIS — J029 Acute pharyngitis, unspecified: Secondary | ICD-10-CM | POA: Insufficient documentation

## 2024-10-01 DIAGNOSIS — K219 Gastro-esophageal reflux disease without esophagitis: Secondary | ICD-10-CM

## 2024-10-01 DIAGNOSIS — R7303 Prediabetes: Secondary | ICD-10-CM | POA: Insufficient documentation

## 2024-10-01 DIAGNOSIS — J441 Chronic obstructive pulmonary disease with (acute) exacerbation: Secondary | ICD-10-CM | POA: Diagnosis not present

## 2024-10-01 DIAGNOSIS — N390 Urinary tract infection, site not specified: Secondary | ICD-10-CM

## 2024-10-01 DIAGNOSIS — Z6836 Body mass index (BMI) 36.0-36.9, adult: Secondary | ICD-10-CM | POA: Diagnosis not present

## 2024-10-01 LAB — URINALYSIS, ROUTINE W REFLEX MICROSCOPIC
Bilirubin, UA: NEGATIVE
Glucose, UA: NEGATIVE
Ketones, UA: NEGATIVE
Leukocytes,UA: NEGATIVE
Nitrite, UA: NEGATIVE
Protein,UA: NEGATIVE
RBC, UA: NEGATIVE
Specific Gravity, UA: 1.01 (ref 1.005–1.030)
Urobilinogen, Ur: 0.2 mg/dL (ref 0.2–1.0)
pH, UA: 5.5 (ref 5.0–7.5)

## 2024-10-01 MED ORDER — ALBUTEROL SULFATE HFA 108 (90 BASE) MCG/ACT IN AERS: INHALATION_SPRAY | RESPIRATORY_TRACT | 3 refills | Status: AC

## 2024-10-01 MED ORDER — LIDOCAINE VISCOUS HCL 2 % MT SOLN: 15.0000 mL | Freq: Four times a day (QID) | OROMUCOSAL | 0 refills | Status: AC | PRN

## 2024-10-01 MED ORDER — OMEPRAZOLE 40 MG PO CPDR: 40.0000 mg | DELAYED_RELEASE_CAPSULE | Freq: Every day | ORAL | 0 refills | Status: AC

## 2024-10-01 MED ORDER — AZITHROMYCIN 250 MG PO TABS: ORAL_TABLET | ORAL | 0 refills | Status: AC

## 2024-10-01 NOTE — Progress Notes (Signed)
 "    Subjective:  Patient ID: Judy Evans, female    DOB: [birth year], 54 y.o.   MRN: [id number]  Patient Care Team: Severa Rock HERO, FNP as PCP - General (Family Medicine) Lavona Agent, MD as PCP - Cardiology (Cardiology) Shaaron Lamar HERO, MD as Consulting Physician (Gastroenterology) Eda Baxter, MD as Referring Physician (Dermatology) Rakes, Rock HERO, FNP as Nurse Practitioner (Family Medicine)   Chief Complaint:  Urinary Tract Infection (Started 3 weeks ago with UTI symptoms. Went to UC and had to wait 2 weeks for abx. ) and sick (Hard to swallow for 3 weeks. Throat pain. UC stated it was sinus drainage but allergie meds not helping. Pt had a tonsillectomy and she said the pain is similar. Referred to ENT. )   HPI: Judy Evans is a 54 y.o. female presenting on 10/01/2024 for Urinary Tract Infection (Started 3 weeks ago with UTI symptoms. Went to UC and had to wait 2 weeks for abx. ) and sick (Hard to swallow for 3 weeks. Throat pain. UC stated it was sinus drainage but allergie meds not helping. Pt had a tonsillectomy and she said the pain is similar. Referred to ENT. )   Discussed the use of AI scribe software for clinical note transcription with the patient, who gave verbal consent to proceed.  History of Present Illness Judy Evans is a 54 year old female who presents with persistent throat discomfort and recent history of urinary tract infection.  She experienced a spell of illness starting a week before Christmas, initially presenting with symptoms of a urinary tract infection (UTI). She visited urgent care the day before Christmas, but the in-office test was negative, and the culture results were delayed until after New Year's, resulting in two weeks without treatment. She was eventually prescribed ciprofloxacin due to allergies to penicillin and amoxicillin. She completed the antibiotic course yesterday but still feels some pressure in her lower abdomen,  although the painful urination has improved.  Around the same time as the UTI, she began experiencing throat discomfort. She describes the sensation as a 'huge cotton ball' in her throat, causing difficulty swallowing solid foods and leading her to consume softer foods like noodles and applesauce. She visited urgent care again on Sunday, where tests for strep, flu, and COVID were negative. She has a history of major sinus issues and had an emergency tonsillectomy at age 54 due to severe strep throat. Despite completing the antibiotic course, she continues to experience throat discomfort, particularly when consuming sodas, and finds relief with warm liquids.  She is currently taking omeprazole . She has a history of esophageal issues requiring dilation. She also uses Breztri  for respiratory issues but reports it causes thrush and prefers Ventolin , which she finds more effective without the side effects. She is a caregiver for her parents, which complicates her ability to manage her own health when unwell.  Family history is significant for cancer, which contributes to her concern about her throat symptoms. She is particularly worried about the possibility of throat cancer due to the family history and her past medical experiences.      Relevant past medical, surgical, family, and social history reviewed and updated as indicated.  Allergies and medications reviewed and updated. Data reviewed: Chart in Epic.   Past Medical History:  Diagnosis Date   Anxiety    Aphasia    Arthritis    COPD (chronic obstructive pulmonary disease) (HCC)    CRPS (complex regional pain syndrome type I)  DDD (degenerative disc disease), cervical    Degenerative disc disease at L5-S1 level    Fibromyalgia    GERD (gastroesophageal reflux disease)    IBS (irritable bowel syndrome)    Migraines    Moody 01/22/2015   Psoriasis    Scoliosis    Spondylolysis     Past Surgical History:  Procedure Laterality Date    ABDOMINAL HYSTERECTOMY     BIOPSY  09/14/2016   Procedure: BIOPSY;  Surgeon: Lamar CHRISTELLA Hollingshead, MD;  Location: [facility];  Service: Endoscopy;;  gastric    CARDIAC CATHETERIZATION  2015   patient reported it to be normal. I was dying in pain during procedure.    CARPAL TUNNEL RELEASE Right    CESAREAN SECTION     CHOLECYSTECTOMY     COLONOSCOPY  06/2014   Judy Evans: normal   DILATION AND CURETTAGE OF UTERUS     ESOPHAGOGASTRODUODENOSCOPY  07/2014   Judy Evans: Normal   ESOPHAGOGASTRODUODENOSCOPY (EGD) WITH PROPOFOL  N/A 09/14/2016   Dr. Hollingshead: Normal esophagus status post empiric dilation, erosive gastropathy with biopsies showing chronic inactive gastritis, no H pylori.   EXTERNAL FIXATION OF FINGER Left 12/01/2021   Procedure: LEFT RING DIGIT WIDGET PLACEMENT;  Surgeon: Murrell Drivers, MD;  Location: [facility];  Service: Orthopedics;  Laterality: Left;   FOOT SURGERY Bilateral    removal of heel spurs   MALONEY DILATION  09/14/2016   Procedure: MALONEY DILATION;  Surgeon: Lamar CHRISTELLA Hollingshead, MD;  Location: [facility];  Service: Endoscopy;;   rotator cuff surgery Right    TONSILLECTOMY      Social History   Socioeconomic History   Marital status: Legally Separated    Spouse name: Not on file   Number of children: 2   Years of education: GED   Highest education level: GED or equivalent  Occupational History   Occupation: Disabled  Tobacco Use   Smoking status: Every Day    Current packs/day: 1.50    Average packs/day: 1.5 packs/day for 30.0 years (45.0 ttl pk-yrs)    Types: Cigarettes   Smokeless tobacco: Never  Vaping Use   Vaping status: Former  Substance and Sexual Activity   Alcohol use: No    Comment: rarely   Drug use: Yes    Types: Marijuana   Sexual activity: Not Currently    Birth control/protection: Surgical    Comment: hyst  Other Topics Concern   Not on file  Social History Narrative   Caffeine use: Drinks coffee (2 cups per  day), tea/soda- 2-3 cups per day   Right handed   Lives alone.   Lost oldest sister 07/2021 due to cardiac issues. Youngest sister died 09/23/21 due to unknown causes.    Social Drivers of Health   Tobacco Use: High Risk (10/01/2024)   Patient History    Smoking Tobacco Use: Every Day    Smokeless Tobacco Use: Never    Passive Exposure: Not on file  Financial Resource Strain: Low Risk (01/15/2024)   Overall Financial Resource Strain (CARDIA)    Difficulty of Paying Living Expenses: Not hard at all  Food Insecurity: No Food Insecurity (01/15/2024)   Hunger Vital Sign    Worried About Running Out of Food in the Last Year: Never true    Ran Out of Food in the Last Year: Never true  Transportation Needs: No Transportation Needs (01/15/2024)   PRAPARE - Transportation    Lack of Transportation (Medical): No    Lack  of Transportation (Non-Medical): No  Physical Activity: Sufficiently Active (01/15/2024)   Exercise Vital Sign    Days of Exercise per Week: 3 days    Minutes of Exercise per Session: 60 min  Stress: Stress Concern Present (01/15/2024)   [employer] of Occupational Health - Occupational Stress Questionnaire    Feeling of Stress : Rather much  Social Connections: Socially Isolated (01/15/2024)   Social Connection and Isolation Panel    Frequency of Communication with Friends and Family: More than three times a week    Frequency of Social Gatherings with Friends and Family: More than three times a week    Attends Religious Services: Never    Database Administrator or Organizations: No    Attends Banker Meetings: Never    Marital Status: Separated  Intimate Partner Violence: Not At Risk (01/15/2024)   Humiliation, Afraid, Rape, and Kick questionnaire    Fear of Current or Ex-Partner: No    Emotionally Abused: No    Physically Abused: No    Sexually Abused: No  Depression (PHQ2-9): Medium Risk (10/01/2024)   Depression (PHQ2-9)    PHQ-2 Score: 9  Alcohol  Screen: Low Risk (01/15/2024)   Alcohol Screen    Last Alcohol Screening Score (AUDIT): 0  Housing: Low Risk (01/15/2024)   Housing Stability Vital Sign    Unable to Pay for Housing in the Last Year: No    Number of Times Moved in the Last Year: 0    Homeless in the Last Year: No  Utilities: Not At Risk (01/10/2023)   [facility] Utilities    Threatened with loss of utilities: No  Health Literacy: Adequate Health Literacy (01/15/2024)   B1300 Health Literacy    Frequency of need for help with medical instructions: Never    Outpatient Encounter Medications as of 10/01/2024  Medication Sig   amitriptyline  (ELAVIL ) 25 MG tablet TAKE 2 TABLETS BY MOUTH AT BEDTIME   Atogepant  (QULIPTA ) 30 MG TABS Take 1 tablet (30 mg total) by mouth daily.   azithromycin  (ZITHROMAX ) 250 MG tablet Take 2 tablets on day 1, then 1 tablet daily on days 2 through 5   budesonide -glycopyrrolate-formoterol  (BREZTRI  AEROSPHERE) 160-9-4.8 MCG/ACT AERO inhaler INHALE 2 PUFFS INTO THE LUNGS TWICE A DAY   celecoxib  (CELEBREX ) 200 MG capsule TAKE 1 CAPSULE BY MOUTH EVERY DAY   cetirizine  (ZYRTEC ) 10 MG tablet Take 1 tablet (10 mg total) by mouth daily.   COSENTYX SENSOREADY, 300 MG, 150 MG/ML SOAJ    fish oil-omega-3 fatty acids  1000 MG capsule Take 2 capsules (2 g total) by mouth daily.   fluticasone  (FLONASE ) 50 MCG/ACT nasal spray SPRAY 2 SPRAYS INTO EACH NOSTRIL EVERY DAY   gabapentin  (NEURONTIN ) 800 MG tablet TAKE 1 TABLET (800 MG TOTAL) BY MOUTH 4 (FOUR) TIMES DAILY. (NEEDS TO BE SEEN BEFORE NEXT REFILL)   ipratropium-albuterol  (DUONEB) 0.5-2.5 (3) MG/3ML SOLN INHALE 3 ML (CONTENTS OF 1 VIAL) BY NEBULIZER EVERY 6 HOURS AS NEEDED   lidocaine  (XYLOCAINE ) 2 % solution Use as directed 15 mLs in the mouth or throat every 6 (six) hours as needed for mouth pain.   nystatin  ointment (MYCOSTATIN ) Apply 1 application topically 2 (two) times daily as needed.   triamcinolone  cream (KENALOG ) 0.1 %    triamcinolone  ointment (KENALOG ) 0.5 %  Apply topically 2 (two) times daily.   [DISCONTINUED] albuterol  (VENTOLIN  HFA) 108 (90 Base) MCG/ACT inhaler USE 2 PUFFS BY MOUTH EVERY 4 HOURS AS NEEDED FOR WHEEZING OR SHORTNESS OF BREATH   [  DISCONTINUED] omeprazole  (PRILOSEC) 40 MG capsule TAKE 1 CAPSULE BY MOUTH EVERY DAY   albuterol  (VENTOLIN  HFA) 108 (90 Base) MCG/ACT inhaler USE 2 PUFFS BY MOUTH EVERY 4 HOURS AS NEEDED FOR WHEEZING OR SHORTNESS OF BREATH   omeprazole  (PRILOSEC) 40 MG capsule Take 1 capsule (40 mg total) by mouth daily.   No facility-administered encounter medications on file as of 10/01/2024.    Allergies[1]  Pertinent ROS per HPI, otherwise unremarkable      Objective:  BP 110/67   Pulse 70   Temp 98 F (36.7 C)   Wt 201 lb (91.2 kg)   SpO2 100%   BMI 36.76 kg/m    Wt Readings from Last 3 Encounters:  10/01/24 201 lb (91.2 kg)  07/23/24 197 lb 12.8 oz (89.7 kg)  06/13/24 201 lb 3.2 oz (91.3 kg)    Physical Exam Vitals and nursing note reviewed.  Constitutional:      General: She is not in acute distress. HENT:     Head: Normocephalic and atraumatic.     Nose: Nose normal.     Mouth/Throat:     Mouth: Mucous membranes are moist.     Pharynx: Pharyngeal swelling and postnasal drip present.  Eyes:     General: No scleral icterus.    Extraocular Movements: Extraocular movements intact.     Conjunctiva/sclera: Conjunctivae normal.     Pupils: Pupils are equal, round, and reactive to light.  Cardiovascular:     Heart sounds: Normal heart sounds.  Pulmonary:     Effort: Pulmonary effort is normal.     Breath sounds: Normal breath sounds.  Musculoskeletal:        General: Normal range of motion.     Right lower leg: No edema.     Left lower leg: No edema.  Skin:    General: Skin is warm and dry.     Findings: No rash.  Neurological:     Mental Status: She is alert and oriented to person, place, and time.  Psychiatric:        Mood and Affect: Mood normal.        Behavior: Behavior normal.         Thought Content: Thought content normal.        Judgment: Judgment normal.    Physical Exam HEENT: Erythematous throat NECK: No lymphadenopathy     Results for orders placed or performed in visit on 05/23/24  Microscopic Examination   Collection Time: 05/23/24  2:06 PM   Urine  Result Value Ref Range   WBC, UA None seen 0 - 5 /hpf   RBC, Urine None seen 0 - 2 /hpf   Epithelial Cells (non renal) 0-10 0 - 10 /hpf   Renal Epithel, UA None seen None seen /hpf   Bacteria, UA Few (A) None seen/Few   Yeast, UA None seen None seen  Urinalysis, Complete   Collection Time: 05/23/24  2:06 PM  Result Value Ref Range   Specific Gravity, UA 1.015 1.005 - 1.030   pH, UA 7.0 5.0 - 7.5   Color, UA Yellow Yellow   Appearance Ur Clear Clear   Leukocytes,UA Negative Negative   Protein,UA Negative Negative/Trace   Glucose, UA Negative Negative   Ketones, UA Trace (A) Negative   RBC, UA Negative Negative   Bilirubin, UA Negative Negative   Urobilinogen, Ur 0.2 0.2 - 1.0 mg/dL   Nitrite, UA Negative Negative   Microscopic Examination See below:  Urine dipstick shows negative for all components.  Micro exam: not done.    Pertinent labs & imaging results that were available during my care of the patient were reviewed by me and considered in my medical decision making.  Assessment & Plan:  Keundra was seen today for urinary tract infection and sick.  Diagnoses and all orders for this visit:  BMI 36.0-36.9,adult -     Urinalysis, Routine w reflex microscopic  Gastroesophageal reflux disease, unspecified whether esophagitis present -     omeprazole  (PRILOSEC) 40 MG capsule; Take 1 capsule (40 mg total) by mouth daily.  Urinary tract infection without hematuria, site unspecified -     Urinalysis, Routine w reflex microscopic -     Urine Culture  Pharyngitis, unspecified etiology -     azithromycin  (ZITHROMAX ) 250 MG tablet; Take 2 tablets on day 1, then 1 tablet daily on  days 2 through 5 -     lidocaine  (XYLOCAINE ) 2 % solution; Use as directed 15 mLs in the mouth or throat every 6 (six) hours as needed for mouth pain.  Chronic obstructive pulmonary disease with acute exacerbation (HCC) -     albuterol  (VENTOLIN  HFA) 108 (90 Base) MCG/ACT inhaler; USE 2 PUFFS BY MOUTH EVERY 4 HOURS AS NEEDED FOR WHEEZING OR SHORTNESS OF BREATH     Assessment and Plan Judy Evans is a 54 year old Caucasian female seen today for chronic disease management, no acute distress Assessment & Plan Acute pharyngitis Persistent throat discomfort with negative strep, flu, and COVID tests. Redness observed. Differential includes sinus drainage. History of tonsillectomy and family history of throat cancer noted. - Prescribed Z-Pak (azithromycin ) for 5 days. - Advised taking Z-Pak with probiotics to prevent yeast infection and diarrhea. - Recommended warm salt water gargles for throat soothing. - Prescribed viscous lidocaine  100 mL to swish and swallow every 6 hours as needed for throat pain.  Urinary tract infection Previously treated with Cipro due to allergies. Symptoms improved, urinalysis clear, indicating resolution. - Advised continued fluid intake, avoiding soda, and opting for water or juice with lime or lemon. - Encouraged warm tea with honey for throat soothing.  Chronic obstructive pulmonary disease with acute exacerbation Exacerbation possibly worsened by Breztri  inhaler. Ventolin  preferred due to effectiveness and lack of thrush side effects. - Prescribed Ventolin  inhaler for acute exacerbation management.  Gastroesophageal reflux disease Managed with omeprazole . Concerns about acid reflux contributing to throat redness. - Refilled omeprazole  prescription.      Continue all other maintenance medications.  Follow up plan: Return if symptoms worsen or fail to improve.   Continue healthy lifestyle choices, including diet (rich in fruits, vegetables, and lean  proteins, and low in salt and simple carbohydrates) and exercise (at least 30 minutes of moderate physical activity daily).  Educational handout given for   Clinical References  BMI for Adults Body mass index (BMI) is a number found using a person's weight and height. BMI can help tell how much of a person's weight is made up of fat. BMI does not measure body fat directly. It is used instead of tests that directly measure body fat, which can be difficult and expensive. What are BMI measurements used for? BMI is useful to: Find out if your weight puts you at higher risk for medical problems. Help recommend changes, such as in diet and exercise. This can help you reach a healthy weight. BMI screening can be done again to see if these changes are working. How is BMI calculated? Your height  and weight are measured. The BMI is found from those numbers. This can be done with U.S[place] or metric measurements. Note that charts and online BMI calculators are available to help you find your BMI quickly and easily without doing these calculations. To calculate your BMI in U.S[place] measurements: Measure your weight in pounds (lb). Multiply the number of pounds by 703. So, for an adult who weighs 150 lb, multiply that number by 703: 150 x 703, which equals 105,450. Measure your height in inches. Then multiply that number by itself to get a measurement called inches squared. So, for an adult who is 70 inches tall, the inches squared measurement is 70 inches x 70 inches, which equals 4,900 inches squared. Divide the total from step 2 (number of lb x 703) by the total from step 3 (inches squared): 105,450  4,900 = 21.5. This is your BMI. To calculate your BMI in metric measurements:  Measure your weight in kilograms (kg). For this example, the weight is 70 kg. Measure your height in meters (m). Then multiply that number by itself to get a measurement called meters squared. So, for an adult who is 1.75 m tall,  the meters squared measurement is 1.75 m x 1.75 m, which equals 3.1 meters squared. Divide the number of kilograms (your weight) by the meters squared number. In this example: 70  3.1 = 22.6. This is your BMI. What do the results mean? BMI charts are used to see if you are underweight, normal weight, overweight, or obese. The following guidelines will be used: Underweight: BMI less than 18.5. Normal weight: BMI between 18.5 and 24.9. Overweight: BMI between 25 and 29.9. Obese: BMI of 30 or above. BMI is a tool and cannot diagnose a condition. Talk with your health care provider about what your BMI means for you. Keep these notes in mind: Weight includes fat and muscle. Someone with a muscular build, such as an athlete, may have a BMI that is higher than 24.9. In cases like these, BMI is not a correct measure of body fat. If you have a BMI of 25 or higher, your provider may need to do more testing to find out if excess body fat is the cause. BMI is measured the same way for males and females. Females usually have more body fat than males of the same height and weight. Where to find more information For more information about BMI, including tools to quickly find your BMI, go to: Centers for Disease Control and Prevention: tonerpromos.no American Heart Association: heart.org National Heart, Lung, and Blood Institute: buffalodrycleaner.gl This information is not intended to replace advice given to you by your health care provider. Make sure you discuss any questions you have with your health care provider. Document Revised: 06/01/2022 Document Reviewed: 05/25/2022 Elsevier Patient Education  2024 Else[employer] Inc. Obesity, Adult Obesity is the condition of having too much total body fat. Being overweight or obese means that your weight is greater than what is considered healthy for your body size. Obesity is determined by a measurement called BMI (body mass index). BMI is an estimate of body fat and is calculated  from height and weight. For adults, a BMI of 30 or higher is considered obese. Obesity can lead to other health concerns and major illnesses, including: Stroke. Coronary artery disease (CAD). Type 2 diabetes. Some types of cancer, including cancers of the colon, breast, uterus, and gallbladder. High blood pressure (hypertension). High cholesterol. Gallbladder stones. Obesity can also contribute to: Osteoarthritis. Sleep  apnea. Infertility problems. What are the causes? Common causes of this condition include: Eating daily meals that are high in calories, sugar, and fat. Drinking high amounts of sugar-sweetened beverages, such as soft drinks. Being born with genes that may make you more likely to become obese. Having a medical condition that causes obesity, including: Hypothyroidism. Polycystic ovarian syndrome (PCOS). Binge-eating disorder. Cushing syndrome. Taking certain medicines, such as steroids, antidepressants, and seizure medicines. Not being physically active (sedentary lifestyle). Not getting enough sleep. What increases the risk? The following factors may make you more likely to develop this condition: Having a family history of obesity. Living in an area with limited access to: [place], recreation centers, or sidewalks. Healthy food choices, such as grocery stores and farmers' markets. What are the signs or symptoms? The main sign of this condition is having too much body fat. How is this diagnosed? This condition is diagnosed based on: Your BMI. If you are an adult with a BMI of 30 or higher, you are considered obese. Your waist circumference. This measures the distance around your waistline. Your skinfold thickness. Your health care provider may gently pinch a fold of your skin and measure it. You may have other tests to check for underlying conditions. How is this treated? Treatment for this condition often includes changing your lifestyle. Treatment may include  some or all of the following: Dietary changes. This may include developing a healthy meal plan. Regular physical activity. This may include activity that causes your heart to beat faster (aerobic exercise) and strength training. Work with your health care provider to design an exercise program that works for you. Medicine to help you lose weight if you are unable to lose one pound a week after six weeks of healthy eating and more physical activity. Treating conditions that cause the obesity (underlying conditions). Surgery. Surgical options may include gastric banding and gastric bypass. Surgery may be done if: Other treatments have not helped to improve your condition. You have a BMI of 40 or higher. You have life-threatening health problems related to obesity. Follow these instructions at home: Eating and drinking  Follow recommendations from your health care provider about what you eat and drink. Your health care provider may advise you to: Limit fast food, sweets, and processed snack foods. Choose low-fat options, such as low-fat milk instead of whole milk. Eat five or more servings of fruits or vegetables every day. Choose healthy foods when you eat out. Keep low-fat snacks available. Limit sugary drinks, such as soda, fruit juice, sweetened iced tea, and flavored milk. Drink enough water to keep your urine pale yellow. Do not follow a fad diet. Fad diets can be unhealthy and even dangerous. Other healthful choices include: Eat at home more often. This gives you more control over what you eat. Learn to read food labels. This will help you understand how much food is considered one serving. Learn what a healthy serving size is. Physical activity Exercise regularly, as told by your health care provider. Most adults should get up to 150 minutes of moderate-intensity exercise every week. Ask your health care provider what types of exercise are safe for you and how often you should  exercise. Warm up and stretch before being active. Cool down and stretch after being active. Rest between periods of activity. Lifestyle Work with your health care provider and a dietitian to set a weight-loss goal that is healthy and reasonable for you. Limit your screen time. Find ways to reward yourself that do  not involve food. Do not drink alcohol if: Your health care provider tells you not to drink. You are pregnant, may be pregnant, or are planning to become pregnant. If you drink alcohol: Limit how much you have to: 0-1 drink a day for women. 0-2 drinks a day for men. Know how much alcohol is in your drink. In the [place] one drink equals one 12 oz bottle of beer (355 mL), one 5 oz glass of wine (148 mL), or one 1 oz glass of hard liquor (44 mL). General instructions Keep a weight-loss journal to keep track of the food you eat and how much exercise you get. Take over-the-counter and prescription medicines only as told by your health care provider. Take vitamins and supplements only as told by your health care provider. Consider joining a support group. Your health care provider may be able to recommend a support group. Pay attention to your mental health as obesity can lead to depression or self esteem issues. Keep all follow-up visits. This is important. Contact a health care provider if: You are unable to meet your weight-loss goal after six weeks of dietary and lifestyle changes. You have trouble breathing. Summary Obesity is the condition of having too much total body fat. Being overweight or obese means that your weight is greater than what is considered healthy for your body size. Work with your health care provider and a dietitian to set a weight-loss goal that is healthy and reasonable for you. Exercise regularly, as told by your health care provider. Ask your health care provider what types of exercise are safe for you and how often you should exercise. This information  is not intended to replace advice given to you by your health care provider. Make sure you discuss any questions you have with your health care provider. Document Revised: 04/19/2021 Document Reviewed: 04/19/2021 Elsevier Patient Education  2024 [employer] COPD and Physical Activity Chronic obstructive pulmonary disease (COPD) is a long-term, or chronic, condition that affects the lungs. COPD is a general term that can be used to describe many problems that cause inflammation of the lungs and limit airflow. These conditions include chronic bronchitis and emphysema. The main symptom of COPD is shortness of breath, which makes it harder to do even simple tasks. This can also make it harder to exercise and stay active. Talk with your health care provider about treatments to help you breathe better and actions you can take to prevent breathing problems during physical activity. What are the benefits of exercising when you have COPD? Exercising regularly is an important part of a healthy lifestyle. You can still exercise and do physical activities even though you have COPD. Exercise and physical activity improve your shortness of breath by increasing blood flow (circulation). This causes your heart to pump more oxygen  through your body. Moderate exercise can: Improve oxygen  use. Increase your energy level. Help with shortness of breath. Strengthen your breathing muscles. Improve heart health. Help with sleep. Improve your self-esteem and feelings of self-worth. Lower depression, stress, and anxiety. Exercise can benefit everyone with COPD. The severity of your disease may affect how hard you can exercise, especially at first, but everyone can benefit. Talk with your health care provider about how much exercise is safe for you, and which activities and exercises are safe for you. What actions can I take to prevent breathing problems during physical activity? Sign up for a pulmonary rehabilitation  program. This type of program may include: Education about lung diseases.  Exercise classes that teach you how to exercise and be more active while improving your breathing. This usually involves: Exercise using your lower extremities, such as a stationary bicycle. About 30 minutes of exercise, 2 to 5 times per week, for 6 to 12 weeks. Strength training, such as push-ups or leg lifts. Nutrition education. Group classes in which you can talk with others who also have COPD and learn ways to manage stress. If you use an oxygen  tank, you should use it while you exercise. Work with your health care provider to adjust your oxygen  for your physical activity. Your resting flow rate is different from your flow rate during physical activity. How to manage your breathing while exercising While you are exercising: Take slow breaths. Pace yourself, and do nottry to go too fast. Purse your lips while breathing out. Pursing your lips is similar to a kissing or whistling position. If doing exercise that uses a quick burst of effort, such as weight lifting: Breathe in before starting the exercise. Breathe out during the hardest part of the exercise, such as raising the weights. Where to find support You can find support for exercising with COPD from: Your health care provider. A pulmonary rehabilitation program. Your local health department or community health programs. Support groups, either online or in-person. Your health care provider may be able to recommend support groups. Where to find more information You can find more information about exercising with COPD from: American Lung Association: lung.org COPD Foundation: copdfoundation.org Contact a health care provider if: Your symptoms get worse. You have nausea. You have a fever. You want to start a new exercise program or a new activity. Get help right away if: You have chest pain. You cannot breathe. These symptoms may represent a serious  problem that is an emergency. Do not wait to see if the symptoms will go away. Get medical help right away. Call your local emergency services (911 in the [place] Do not drive yourself to the hospital. Summary COPD is a general term that can be used to describe many different lung problems that cause lung inflammation and limit airflow. This includes chronic bronchitis and emphysema. Exercise and physical activity improve your shortness of breath by increasing blood flow (circulation). This causes your heart to provide more oxygen  to your body. Contact your health care provider before starting any exercise program or new activity. Ask your health care provider what exercises and activities are safe for you. This information is not intended to replace advice given to you by your health care provider. Make sure you discuss any questions you have with your health care provider. Document Revised: 07/27/2023 Document Reviewed: 07/27/2023 Elsevier Patient Education  2024 [employer] Pharyngitis  Pharyngitis is inflammation of the throat (pharynx). It is a very common cause of sore throat. Pharyngitis can be caused by a bacteria, but it is usually caused by a virus. Most cases of pharyngitis get better on their own without treatment. What are the causes? This condition may be caused by: Infection by viruses (viral). Viral pharyngitis spreads easily from person to person (is contagious) through coughing, sneezing, and sharing of personal items or utensils such as cups, forks, spoons, and toothbrushes. Infection by bacteria (bacterial). Bacterial pharyngitis may be spread by touching the nose or face after coming in contact with the bacteria, or through close contact, such as kissing. Allergies. Allergies can cause buildup of mucus in the throat (post-nasal drip), leading to inflammation and irritation. Allergies can also cause blocked nasal  passages, forcing breathing through the mouth, which dries and  irritates the throat. What increases the risk? You are more likely to develop this condition if: You are 66-42 years old. You are exposed to crowded environments such as daycare, school, or dormitory living. You live in a cold climate. You have a weakened disease-fighting (immune) system. What are the signs or symptoms? Symptoms of this condition vary by the cause. Common symptoms of this condition include: Sore throat. Fatigue. Low-grade fever. Stuffy nose (nasal congestion) and cough. Headache. Other symptoms may include: Glands in the neck (lymph nodes) that are swollen. Skin rashes. Plaque-like film on the throat or tonsils. This is often a symptom of bacterial pharyngitis. Vomiting. Red, itchy eyes (conjunctivitis). Loss of appetite. Joint pain and muscle aches. Enlarged tonsils. How is this diagnosed? This condition may be diagnosed based on your medical history and a physical exam. Your health care provider will ask you questions about your illness and your symptoms. A swab of your throat may be done to check for bacteria (rapid strep test). Other lab tests may also be done, depending on the suspected cause, but these are rare. How is this treated? Many times, treatment is not needed for this condition. Pharyngitis usually gets better in 3-4 days without treatment. Bacterial pharyngitis may be treated with antibiotic medicines. Follow these instructions at home: Medicines Take over-the-counter and prescription medicines only as told by your health care provider. If you were prescribed an antibiotic medicine, take it as told by your health care provider. Do not stop taking the antibiotic even if you start to feel better. Use throat sprays to soothe your throat as told by your health care provider. Children can get pharyngitis. Do not give your child aspirin  because of the association with Reye's syndrome. Managing pain To help with pain, try: Sipping warm liquids, such as  broth, herbal tea, or warm water. Eating or drinking cold or frozen liquids, such as frozen ice pops. Gargling with a mixture of salt and water 3-4 times a day or as needed. To make salt water, completely dissolve -1 tsp (3-6 g) of salt in 1 cup (237 mL) of warm water. Sucking on hard candy or throat lozenges. Putting a cool-mist humidifier in your bedroom at night to moisten the air. Sitting in the bathroom with the door closed for 5-10 minutes while you run hot water in the shower.   General instructions  Do not use any products that contain nicotine or tobacco. These products include cigarettes, chewing tobacco, and vaping devices, such as e-cigarettes. If you need help quitting, ask your health care provider. Rest as told by your health care provider. Drink enough fluid to keep your urine pale yellow. How is this prevented? To help prevent becoming infected or spreading infection: Wash your hands often with soap and water for at least 20 seconds. If soap and water are not available, use hand sanitizer. Do not touch your eyes, nose, or mouth with unwashed hands, and wash hands after touching these areas. Do not share cups or eating utensils. Avoid close contact with people who are sick. Contact a health care provider if: You have large, tender lumps in your neck. You have a rash. You cough up green, yellow-brown, or bloody mucus. Get help right away if: Your neck becomes stiff. You drool or are unable to swallow liquids. You cannot drink or take medicines without vomiting. You have severe pain that does not go away, even after you take medicine. You have  trouble breathing, and it is not caused by a stuffy nose. You have new pain and swelling in your joints such as the knees, ankles, wrists, or elbows. These symptoms may represent a serious problem that is an emergency. Do not wait to see if the symptoms will go away. Get medical help right away. Call your local emergency services  (911 in the [place] Do not drive yourself to the hospital. Summary Pharyngitis is redness, pain, and swelling (inflammation) of the throat (pharynx). While pharyngitis can be caused by a bacteria, the most common causes are viral. Most cases of pharyngitis get better on their own without treatment. Bacterial pharyngitis is treated with antibiotic medicines. This information is not intended to replace advice given to you by your health care provider. Make sure you discuss any questions you have with your health care provider. Document Revised: 12/08/2020 Document Reviewed: 12/08/2020 Elsevier Patient Education  2024 [employer]  The above assessment and management plan was discussed with the patient. The patient verbalized understanding of and has agreed to the management plan. Patient is aware to call the clinic if they develop any new symptoms or if symptoms persist or worsen. Patient is aware when to return to the clinic for a follow-up visit. Patient educated on when it is appropriate to go to the emergency department.    Nena Cassis Morton Hummer, [place] [facility] [address] [place], [place] [address] [phone]       [1]  Allergies Allergen Reactions   Rosuvastatin Calcium Swelling    Ends up in the hospital   Statins Swelling    Ends up in the hospital   Wellbutrin [Bupropion]     suicidal   Hctz [Hydrochlorothiazide] Nausea And Vomiting and Rash   Hydrocodone -Acetaminophen  Rash   Oxycodone  Rash   Penicillins Nausea And Vomiting and Rash    Has patient had a PCN reaction causing immediate rash, facial/tongue/throat swelling, SOB or lightheadedness with hypotension: Yes Has patient had a PCN reaction causing severe rash involving mucus membranes or skin necrosis: No Has patient had a PCN reaction that required hospitalization No Has patient had a PCN reaction occurring within the last 10 years: No If all of the above answers are NO, then  may proceed with Cephalosporin use.    "

## 2024-10-03 LAB — URINE CULTURE: Organism ID, Bacteria: NO GROWTH

## 2024-10-13 ENCOUNTER — Other Ambulatory Visit: Payer: Self-pay | Admitting: Family Medicine

## 2024-10-13 DIAGNOSIS — J449 Chronic obstructive pulmonary disease, unspecified: Secondary | ICD-10-CM

## 2024-12-11 ENCOUNTER — Ambulatory Visit: Payer: Self-pay | Admitting: Family Medicine

## 2025-02-19 ENCOUNTER — Ambulatory Visit: Admitting: Neurology

## 2025-05-01 ENCOUNTER — Encounter: Payer: Self-pay | Admitting: Family Medicine
